# Patient Record
Sex: Female | Born: 1955
Health system: Southern US, Community
[De-identification: ages and names within clinical notes are randomized; demographics above are authoritative.]

## PROBLEM LIST (undated history)

## (undated) DIAGNOSIS — B1920 Unspecified viral hepatitis C without hepatic coma: Secondary | ICD-10-CM

## (undated) DIAGNOSIS — R51 Headache: Secondary | ICD-10-CM

## (undated) DIAGNOSIS — I639 Cerebral infarction, unspecified: Secondary | ICD-10-CM

## (undated) DIAGNOSIS — J449 Chronic obstructive pulmonary disease, unspecified: Secondary | ICD-10-CM

## (undated) DIAGNOSIS — M5430 Sciatica, unspecified side: Secondary | ICD-10-CM

## (undated) DIAGNOSIS — R413 Other amnesia: Secondary | ICD-10-CM

## (undated) DIAGNOSIS — G8929 Other chronic pain: Secondary | ICD-10-CM

## (undated) DIAGNOSIS — F4310 Post-traumatic stress disorder, unspecified: Secondary | ICD-10-CM

## (undated) DIAGNOSIS — R569 Unspecified convulsions: Secondary | ICD-10-CM

## (undated) DIAGNOSIS — H409 Unspecified glaucoma: Secondary | ICD-10-CM

## (undated) DIAGNOSIS — Z8489 Family history of other specified conditions: Secondary | ICD-10-CM

## (undated) DIAGNOSIS — M1990 Unspecified osteoarthritis, unspecified site: Secondary | ICD-10-CM

## (undated) DIAGNOSIS — C50919 Malignant neoplasm of unspecified site of unspecified female breast: Secondary | ICD-10-CM

## (undated) DIAGNOSIS — R531 Weakness: Secondary | ICD-10-CM

## (undated) DIAGNOSIS — J189 Pneumonia, unspecified organism: Secondary | ICD-10-CM

## (undated) DIAGNOSIS — B1710 Acute hepatitis C without hepatic coma: Secondary | ICD-10-CM

## (undated) DIAGNOSIS — E785 Hyperlipidemia, unspecified: Secondary | ICD-10-CM

## (undated) DIAGNOSIS — H579 Unspecified disorder of eye and adnexa: Secondary | ICD-10-CM

## (undated) DIAGNOSIS — I6789 Other cerebrovascular disease: Secondary | ICD-10-CM

## (undated) DIAGNOSIS — F1420 Cocaine dependence, uncomplicated: Secondary | ICD-10-CM

## (undated) DIAGNOSIS — F4321 Adjustment disorder with depressed mood: Secondary | ICD-10-CM

## (undated) DIAGNOSIS — I219 Acute myocardial infarction, unspecified: Secondary | ICD-10-CM

## (undated) DIAGNOSIS — F17200 Nicotine dependence, unspecified, uncomplicated: Secondary | ICD-10-CM

## (undated) DIAGNOSIS — I10 Essential (primary) hypertension: Secondary | ICD-10-CM

## (undated) DIAGNOSIS — I2510 Atherosclerotic heart disease of native coronary artery without angina pectoris: Secondary | ICD-10-CM

## (undated) DIAGNOSIS — M549 Dorsalgia, unspecified: Secondary | ICD-10-CM

## (undated) HISTORY — DX: Acute hepatitis C without hepatic coma: B17.10

## (undated) HISTORY — PX: COLONOSCOPY W/ POLYPECTOMY: SHX1380

## (undated) HISTORY — DX: Other cerebrovascular disease: I67.89

## (undated) HISTORY — DX: Adjustment disorder with depressed mood: F43.21

## (undated) HISTORY — DX: Atherosclerotic heart disease of native coronary artery without angina pectoris: I25.10

## (undated) HISTORY — DX: Cerebral infarction, unspecified: I63.9

## (undated) HISTORY — DX: Cocaine dependence, uncomplicated: F14.20

## (undated) HISTORY — DX: Unspecified viral hepatitis C without hepatic coma: B19.20

## (undated) HISTORY — PX: TONSILLECTOMY: SUR1361

## (undated) HISTORY — DX: Unspecified convulsions: R56.9

## (undated) HISTORY — DX: Acute myocardial infarction, unspecified: I21.9

## (undated) HISTORY — DX: Chronic obstructive pulmonary disease, unspecified: J44.9

## (undated) HISTORY — DX: Essential (primary) hypertension: I10

## (undated) HISTORY — DX: Nicotine dependence, unspecified, uncomplicated: F17.200

## (undated) HISTORY — PX: EYE SURGERY: SHX253

---

## 1982-11-17 DIAGNOSIS — Z8489 Family history of other specified conditions: Secondary | ICD-10-CM

## 1982-11-17 HISTORY — DX: Family history of other specified conditions: Z84.89

## 1998-05-30 ENCOUNTER — Emergency Department (HOSPITAL_COMMUNITY): Admission: EM | Admit: 1998-05-30 | Discharge: 1998-05-30 | Payer: Self-pay | Admitting: Emergency Medicine

## 1998-06-06 ENCOUNTER — Inpatient Hospital Stay (HOSPITAL_COMMUNITY): Admission: AD | Admit: 1998-06-06 | Discharge: 1998-06-08 | Payer: Self-pay | Admitting: Internal Medicine

## 1999-07-29 ENCOUNTER — Emergency Department (HOSPITAL_COMMUNITY): Admission: EM | Admit: 1999-07-29 | Discharge: 1999-07-29 | Payer: Self-pay | Admitting: Emergency Medicine

## 1999-07-29 ENCOUNTER — Encounter: Payer: Self-pay | Admitting: Emergency Medicine

## 2000-04-17 ENCOUNTER — Encounter: Payer: Self-pay | Admitting: Emergency Medicine

## 2000-04-17 ENCOUNTER — Emergency Department (HOSPITAL_COMMUNITY): Admission: EM | Admit: 2000-04-17 | Discharge: 2000-04-17 | Payer: Self-pay | Admitting: Emergency Medicine

## 2002-10-03 ENCOUNTER — Inpatient Hospital Stay (HOSPITAL_COMMUNITY): Admission: EM | Admit: 2002-10-03 | Discharge: 2002-10-14 | Payer: Self-pay | Admitting: Emergency Medicine

## 2002-10-03 ENCOUNTER — Encounter: Payer: Self-pay | Admitting: Emergency Medicine

## 2002-10-06 ENCOUNTER — Encounter: Payer: Self-pay | Admitting: Internal Medicine

## 2002-10-11 ENCOUNTER — Encounter: Payer: Self-pay | Admitting: Internal Medicine

## 2002-10-14 ENCOUNTER — Inpatient Hospital Stay (HOSPITAL_COMMUNITY)
Admission: RE | Admit: 2002-10-14 | Discharge: 2002-10-28 | Payer: Self-pay | Admitting: Physical Medicine & Rehabilitation

## 2003-03-29 ENCOUNTER — Encounter: Payer: Self-pay | Admitting: Internal Medicine

## 2003-03-29 ENCOUNTER — Emergency Department (HOSPITAL_COMMUNITY): Admission: EM | Admit: 2003-03-29 | Discharge: 2003-03-29 | Payer: Self-pay | Admitting: Internal Medicine

## 2003-06-23 ENCOUNTER — Emergency Department (HOSPITAL_COMMUNITY): Admission: EM | Admit: 2003-06-23 | Discharge: 2003-06-23 | Payer: Self-pay | Admitting: Emergency Medicine

## 2003-06-23 ENCOUNTER — Encounter: Payer: Self-pay | Admitting: Emergency Medicine

## 2004-05-04 ENCOUNTER — Emergency Department (HOSPITAL_COMMUNITY): Admission: EM | Admit: 2004-05-04 | Discharge: 2004-05-04 | Payer: Self-pay | Admitting: Emergency Medicine

## 2004-05-22 ENCOUNTER — Encounter: Admission: RE | Admit: 2004-05-22 | Discharge: 2004-05-22 | Payer: Self-pay | Admitting: Sports Medicine

## 2004-05-29 ENCOUNTER — Encounter: Admission: RE | Admit: 2004-05-29 | Discharge: 2004-05-29 | Payer: Self-pay | Admitting: Family Medicine

## 2004-07-14 ENCOUNTER — Emergency Department (HOSPITAL_COMMUNITY): Admission: EM | Admit: 2004-07-14 | Discharge: 2004-07-14 | Payer: Self-pay | Admitting: Emergency Medicine

## 2004-09-05 ENCOUNTER — Ambulatory Visit: Payer: Self-pay | Admitting: Family Medicine

## 2004-09-12 ENCOUNTER — Ambulatory Visit: Payer: Self-pay | Admitting: Family Medicine

## 2004-11-15 ENCOUNTER — Emergency Department (HOSPITAL_COMMUNITY): Admission: EM | Admit: 2004-11-15 | Discharge: 2004-11-15 | Payer: Self-pay | Admitting: Emergency Medicine

## 2005-01-09 ENCOUNTER — Ambulatory Visit: Payer: Self-pay | Admitting: Family Medicine

## 2005-02-24 ENCOUNTER — Ambulatory Visit: Payer: Self-pay | Admitting: Family Medicine

## 2005-03-22 ENCOUNTER — Encounter (INDEPENDENT_AMBULATORY_CARE_PROVIDER_SITE_OTHER): Payer: Self-pay | Admitting: *Deleted

## 2005-03-22 LAB — CONVERTED CEMR LAB

## 2005-03-27 ENCOUNTER — Ambulatory Visit: Payer: Self-pay | Admitting: Family Medicine

## 2005-03-27 ENCOUNTER — Other Ambulatory Visit: Admission: RE | Admit: 2005-03-27 | Discharge: 2005-03-27 | Payer: Self-pay | Admitting: Family Medicine

## 2005-06-10 ENCOUNTER — Ambulatory Visit: Payer: Self-pay | Admitting: Sports Medicine

## 2005-07-03 ENCOUNTER — Ambulatory Visit: Payer: Self-pay | Admitting: Family Medicine

## 2005-08-18 ENCOUNTER — Ambulatory Visit: Payer: Self-pay | Admitting: Family Medicine

## 2006-01-10 ENCOUNTER — Inpatient Hospital Stay (HOSPITAL_COMMUNITY): Admission: EM | Admit: 2006-01-10 | Discharge: 2006-01-12 | Payer: Self-pay | Admitting: Emergency Medicine

## 2006-04-07 ENCOUNTER — Ambulatory Visit: Payer: Self-pay | Admitting: Sports Medicine

## 2006-07-09 ENCOUNTER — Ambulatory Visit: Payer: Self-pay | Admitting: Family Medicine

## 2007-01-14 DIAGNOSIS — I6789 Other cerebrovascular disease: Secondary | ICD-10-CM | POA: Insufficient documentation

## 2007-01-14 DIAGNOSIS — F1420 Cocaine dependence, uncomplicated: Secondary | ICD-10-CM

## 2007-01-14 DIAGNOSIS — I10 Essential (primary) hypertension: Secondary | ICD-10-CM | POA: Insufficient documentation

## 2007-01-14 DIAGNOSIS — F17200 Nicotine dependence, unspecified, uncomplicated: Secondary | ICD-10-CM | POA: Insufficient documentation

## 2007-01-14 DIAGNOSIS — R569 Unspecified convulsions: Secondary | ICD-10-CM

## 2007-01-14 HISTORY — DX: Essential (primary) hypertension: I10

## 2007-01-14 HISTORY — DX: Unspecified convulsions: R56.9

## 2007-01-14 HISTORY — DX: Cocaine dependence, uncomplicated: F14.20

## 2007-01-14 HISTORY — DX: Other cerebrovascular disease: I67.89

## 2007-01-14 HISTORY — DX: Nicotine dependence, unspecified, uncomplicated: F17.200

## 2007-01-15 ENCOUNTER — Encounter (INDEPENDENT_AMBULATORY_CARE_PROVIDER_SITE_OTHER): Payer: Self-pay | Admitting: *Deleted

## 2007-02-24 ENCOUNTER — Ambulatory Visit: Payer: Self-pay | Admitting: Family Medicine

## 2007-04-18 ENCOUNTER — Inpatient Hospital Stay (HOSPITAL_COMMUNITY): Admission: EM | Admit: 2007-04-18 | Discharge: 2007-04-21 | Payer: Self-pay | Admitting: Emergency Medicine

## 2007-04-19 ENCOUNTER — Encounter (INDEPENDENT_AMBULATORY_CARE_PROVIDER_SITE_OTHER): Payer: Self-pay | Admitting: Neurology

## 2007-04-20 ENCOUNTER — Ambulatory Visit: Payer: Self-pay | Admitting: Physical Medicine & Rehabilitation

## 2007-05-17 ENCOUNTER — Encounter (HOSPITAL_COMMUNITY): Admission: RE | Admit: 2007-05-17 | Discharge: 2007-05-17 | Payer: Self-pay | Admitting: Cardiology

## 2007-06-06 ENCOUNTER — Emergency Department (HOSPITAL_COMMUNITY): Admission: EM | Admit: 2007-06-06 | Discharge: 2007-06-07 | Payer: Self-pay | Admitting: Emergency Medicine

## 2007-11-18 HISTORY — PX: CORONARY ANGIOPLASTY WITH STENT PLACEMENT: SHX49

## 2007-12-19 ENCOUNTER — Encounter: Payer: Self-pay | Admitting: Emergency Medicine

## 2007-12-19 ENCOUNTER — Inpatient Hospital Stay (HOSPITAL_COMMUNITY): Admission: RE | Admit: 2007-12-19 | Discharge: 2007-12-22 | Payer: Self-pay | Admitting: Cardiology

## 2007-12-20 DIAGNOSIS — I219 Acute myocardial infarction, unspecified: Secondary | ICD-10-CM

## 2007-12-20 HISTORY — DX: Acute myocardial infarction, unspecified: I21.9

## 2008-02-03 ENCOUNTER — Encounter (HOSPITAL_COMMUNITY): Admission: RE | Admit: 2008-02-03 | Discharge: 2008-03-04 | Payer: Self-pay | Admitting: Cardiology

## 2008-03-06 ENCOUNTER — Encounter (HOSPITAL_COMMUNITY): Admission: RE | Admit: 2008-03-06 | Discharge: 2008-04-05 | Payer: Self-pay | Admitting: Cardiology

## 2008-03-22 ENCOUNTER — Inpatient Hospital Stay (HOSPITAL_COMMUNITY): Admission: AC | Admit: 2008-03-22 | Discharge: 2008-03-28 | Payer: Self-pay

## 2008-06-04 ENCOUNTER — Emergency Department (HOSPITAL_COMMUNITY): Admission: EM | Admit: 2008-06-04 | Discharge: 2008-06-04 | Payer: Self-pay | Admitting: Emergency Medicine

## 2008-07-10 ENCOUNTER — Ambulatory Visit: Payer: Self-pay | Admitting: Sports Medicine

## 2008-07-10 ENCOUNTER — Telehealth: Payer: Self-pay | Admitting: *Deleted

## 2008-07-10 DIAGNOSIS — I2510 Atherosclerotic heart disease of native coronary artery without angina pectoris: Secondary | ICD-10-CM | POA: Insufficient documentation

## 2008-07-10 DIAGNOSIS — G3184 Mild cognitive impairment, so stated: Secondary | ICD-10-CM | POA: Insufficient documentation

## 2008-07-10 DIAGNOSIS — Z9189 Other specified personal risk factors, not elsewhere classified: Secondary | ICD-10-CM | POA: Insufficient documentation

## 2008-07-10 HISTORY — DX: Atherosclerotic heart disease of native coronary artery without angina pectoris: I25.10

## 2008-10-06 ENCOUNTER — Ambulatory Visit (HOSPITAL_COMMUNITY): Admission: RE | Admit: 2008-10-06 | Discharge: 2008-10-06 | Payer: Self-pay | Admitting: Cardiology

## 2008-10-16 ENCOUNTER — Emergency Department (HOSPITAL_COMMUNITY): Admission: EM | Admit: 2008-10-16 | Discharge: 2008-10-17 | Payer: Self-pay | Admitting: Emergency Medicine

## 2009-01-11 ENCOUNTER — Encounter: Payer: Self-pay | Admitting: Family Medicine

## 2009-01-11 ENCOUNTER — Ambulatory Visit: Payer: Self-pay | Admitting: Family Medicine

## 2009-01-11 DIAGNOSIS — F4321 Adjustment disorder with depressed mood: Secondary | ICD-10-CM | POA: Insufficient documentation

## 2009-01-11 HISTORY — DX: Adjustment disorder with depressed mood: F43.21

## 2009-01-16 ENCOUNTER — Encounter: Payer: Self-pay | Admitting: Family Medicine

## 2009-01-16 DIAGNOSIS — R7402 Elevation of levels of lactic acid dehydrogenase (LDH): Secondary | ICD-10-CM | POA: Insufficient documentation

## 2009-01-16 DIAGNOSIS — R7401 Elevation of levels of liver transaminase levels: Secondary | ICD-10-CM | POA: Insufficient documentation

## 2009-01-16 LAB — CONVERTED CEMR LAB
ALT: 51 units/L — ABNORMAL HIGH (ref 0–35)
AST: 86 units/L — ABNORMAL HIGH (ref 0–37)
Albumin: 4.2 g/dL (ref 3.5–5.2)
Calcium: 9 mg/dL (ref 8.4–10.5)
Chloride: 104 meq/L (ref 96–112)
Hemoglobin: 12.6 g/dL (ref 12.0–15.0)
Platelets: 163 10*3/uL (ref 150–400)
Potassium: 3.8 meq/L (ref 3.5–5.3)
RDW: 13.4 % (ref 11.5–15.5)
Total Protein: 7.6 g/dL (ref 6.0–8.3)

## 2009-02-16 ENCOUNTER — Encounter: Payer: Self-pay | Admitting: Family Medicine

## 2009-02-16 ENCOUNTER — Ambulatory Visit: Payer: Self-pay | Admitting: Family Medicine

## 2009-02-16 ENCOUNTER — Telehealth: Payer: Self-pay | Admitting: Psychology

## 2009-02-16 LAB — CONVERTED CEMR LAB
HCV Ab: REACTIVE — AB
Hep B Core Total Ab: POSITIVE — AB
Hepatitis B Surface Ag: NEGATIVE

## 2009-02-19 ENCOUNTER — Encounter (INDEPENDENT_AMBULATORY_CARE_PROVIDER_SITE_OTHER): Payer: Self-pay | Admitting: *Deleted

## 2009-02-26 ENCOUNTER — Telehealth: Payer: Self-pay | Admitting: Family Medicine

## 2009-02-26 DIAGNOSIS — B1710 Acute hepatitis C without hepatic coma: Secondary | ICD-10-CM | POA: Insufficient documentation

## 2009-02-26 HISTORY — DX: Acute hepatitis C without hepatic coma: B17.10

## 2009-02-28 ENCOUNTER — Encounter: Payer: Self-pay | Admitting: Family Medicine

## 2009-03-05 ENCOUNTER — Ambulatory Visit: Payer: Self-pay | Admitting: Family Medicine

## 2009-03-05 ENCOUNTER — Encounter (INDEPENDENT_AMBULATORY_CARE_PROVIDER_SITE_OTHER): Payer: Self-pay | Admitting: Family Medicine

## 2009-03-07 LAB — CONVERTED CEMR LAB

## 2009-03-17 ENCOUNTER — Emergency Department (HOSPITAL_COMMUNITY): Admission: EM | Admit: 2009-03-17 | Discharge: 2009-03-17 | Payer: Self-pay | Admitting: Emergency Medicine

## 2009-04-19 ENCOUNTER — Encounter: Payer: Self-pay | Admitting: Family Medicine

## 2009-04-19 ENCOUNTER — Ambulatory Visit: Payer: Self-pay | Admitting: Gastroenterology

## 2009-05-01 ENCOUNTER — Encounter (INDEPENDENT_AMBULATORY_CARE_PROVIDER_SITE_OTHER): Payer: Self-pay | Admitting: *Deleted

## 2009-05-16 ENCOUNTER — Ambulatory Visit (HOSPITAL_COMMUNITY): Admission: RE | Admit: 2009-05-16 | Discharge: 2009-05-16 | Payer: Self-pay | Admitting: Gastroenterology

## 2009-05-16 ENCOUNTER — Encounter (INDEPENDENT_AMBULATORY_CARE_PROVIDER_SITE_OTHER): Payer: Self-pay | Admitting: Interventional Radiology

## 2009-05-18 ENCOUNTER — Encounter: Payer: Self-pay | Admitting: Family Medicine

## 2009-06-05 ENCOUNTER — Encounter: Payer: Self-pay | Admitting: Family Medicine

## 2009-06-05 ENCOUNTER — Ambulatory Visit: Payer: Self-pay | Admitting: Gastroenterology

## 2009-07-09 ENCOUNTER — Encounter: Payer: Self-pay | Admitting: Family Medicine

## 2010-09-01 ENCOUNTER — Encounter: Payer: Self-pay | Admitting: Family Medicine

## 2010-10-29 ENCOUNTER — Encounter: Payer: Self-pay | Admitting: Family Medicine

## 2010-12-08 ENCOUNTER — Encounter: Payer: Self-pay | Admitting: Cardiology

## 2010-12-17 NOTE — Miscellaneous (Signed)
Summary: Asthma  Clinical Lists Changes  Problems: Removed problem of ASTHMA, UNSPECIFIED (ICD-493.90) Removed problem of MUSCLE SPASM, BACK (ICD-724.8) Removed problem of BLOOD IN STOOL (ICD-578.1) Removed problem of WEAKNESS (ICD-780.79) Removed problem of BACK PAIN W/RADIATION, UNSPECIFIED (ICD-724.4)
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
## 2011-02-24 LAB — CBC
MCHC: 34.4 g/dL (ref 30.0–36.0)
MCV: 94.9 fL (ref 78.0–100.0)
Platelets: 180 10*3/uL (ref 150–400)

## 2011-02-24 LAB — PROTIME-INR: Prothrombin Time: 14.9 seconds (ref 11.6–15.2)

## 2011-02-25 LAB — RAPID URINE DRUG SCREEN, HOSP PERFORMED
Barbiturates: NOT DETECTED
Cocaine: POSITIVE — AB
Opiates: NOT DETECTED
Tetrahydrocannabinol: POSITIVE — AB

## 2011-02-25 LAB — URINALYSIS, ROUTINE W REFLEX MICROSCOPIC
Glucose, UA: NEGATIVE mg/dL
Hgb urine dipstick: NEGATIVE
Protein, ur: NEGATIVE mg/dL
pH: 6 (ref 5.0–8.0)

## 2011-02-25 LAB — POCT I-STAT, CHEM 8
BUN: 12 mg/dL (ref 6–23)
Calcium, Ion: 1.11 mmol/L — ABNORMAL LOW (ref 1.12–1.32)
Creatinine, Ser: 1.2 mg/dL (ref 0.4–1.2)
Glucose, Bld: 92 mg/dL (ref 70–99)
TCO2: 22 mmol/L (ref 0–100)

## 2011-02-25 LAB — CARBAMAZEPINE LEVEL, TOTAL: Carbamazepine Lvl: <10 ug/mL (ref 4.0–12.0)
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
## 2011-04-01 NOTE — Cardiovascular Report (Signed)
[name], [name]                  ACCOUNT NO.:  [id number]   MEDICAL RECORD NO.:  [id number]          PATIENT TYPE:  INP   LOCATION:  2901                         FACILITY:  [facility]   PHYSICIAN:  Eduardo Osier. Sharyn Lull, M.D. DATE OF BIRTH:  [birth year]   DATE OF PROCEDURE:  12/19/2007  DATE OF DISCHARGE:                            CARDIAC CATHETERIZATION   PROCEDURE:  1. Left cardiac cath with selective left and right coronary      angiography, LV graft via the right groin using Judkins technique.  2. Successful PTCA to proximal and mid junction of RCA using 3.0 x 12      mm long Voyager balloon.  3. Successful deployment of 3.5 x 23 mm long Promus drug eluting stent      in proximal and mid junction of RCA.  4. Successful post dilatation of Promus stent using 4.0 x 15 mm long      PowerSail balloon.   INDICATIONS FOR PROCEDURE:  [name] is a 55 year old black female with  past medical history significant for hypertension, history of CVA, post  traumatic stress syndrome, tobacco abuse, marijuana abuse, history of  cocaine abuse, history of positive family history of coronary artery  disease who was transferred from [facility].  The patient  complained of retrosternal chest pain associated with diaphoresis  radiating to both arms lasting for more than 2 hours.  EKG done at [facility] showed minimal ST elevation in inferolateral leads with no  significant  focal ST depression and nonspecific T-wave changes and was  noted to have elevated troponin I of 0.89 and CPK-MB of 14.1.  The  patient was transferred here for emergency PCI.  The patient states she  has been having this chest pain off and on but as today's chest pain was  severe and prolonged she decided to go to the ER.  Denies any recent  cocaine abuse.  States smoked cocaine approximately 1 month ago.  States  chest pain started while she was watching TV.   PAST MEDICAL HISTORY:  As above.   PAST SURGICAL  HISTORY:  None.   ALLERGIES:  None.   MEDICATIONS:  At home she is on Norvasc, aspirin, hydrochlorothiazide,  Imdur, Tegretol.   SOCIAL HISTORY:  She is single, has one son on disability.  Smokes two  to three cigarettes per day.  Drinks and smokes occasionally, also  smokes marijuana and cocaine occasionally.   FAMILY HISTORY:  Is positive for coronary artery disease.   PHYSICAL EXAMINATION:  GENERAL:  She is alert and oriented x3.  VITAL SIGNS:  Blood pressure was 102/60, pulse was 58 regular.  HEENT:  Conjunctivae was pink.  NECK:  Neck supple.  No JVD, no bruit.  LUNGS:  Lungs were clear to auscultation without rhonchi or rales.  CARDIOVASCULAR:  S1, S2 normal.  There was soft S4 gallop.  ABDOMEN:  Abdomen was soft.  Bowel sounds present, nontender.  EXTREMITIES:  There is no clubbing, cyanosis or edema.   Discussed with the patient at length by Dr. Margretta Ditty, ER physician at  [facility], regarding emergency left cath, possible PTCA with  stenting, its risks and benefits, i.e. death, MI, stroke, need for  emergency CABG, risk of restenosis, local vascular complications.  She  accepted and consented for the procedure.   PROCEDURE IN DETAIL:  After obtaining the informed consent the patient  was brought to the cath lab and was placed on fluoroscopy table.  Right  groin was prepped and draped in usual fashion.  Xylocaine 2% was used  for local anesthesia in the right groin.  With the help of thin-walled  needle a 6 French arterial sheath was placed.  The sheath was aspirated  and flushed.  Next, 6 French left Judkins catheter was advanced over the  wire under fluoroscopic guidance to the ascending aorta.  Wire was  pulled out, the catheter was aspirated and connected to the manifold.  Catheter was further advanced and engaged into left coronary ostium.  Multiple views of the left system were taken.  Next, the catheter was  disengaged and was pulled out over the wire and  was replaced with 6  French right Judkins catheter which was advanced over the wire under  fluoroscopic guidance up to the ascending aorta.  Wire was pulled out,  the catheter was aspirated and connected to the manifold.  Catheter was  further advanced and then engaged into right coronary ostium.  Multiple  views of the right system were taken.  Next, the catheter was disengaged  and was pulled out over the wire and was replaced with 6 French pigtail  catheter which was advanced over the wire under fluoroscopic guidance up  to the ascending aorta.  Wire was pulled out, the catheter was aspirated  and connected to the manifold.  The catheter was further advanced across  aortic valve into the LV.  LV pressures were recorded.  Next, LV graft  was done in 30 degree RAO position.  Post angiographic pressures were  recorded from LV and then pullback pressures were recorded from the  aorta.  There was no gradient across the aortic valve.  Next, a pigtail  catheter was pulled out over the wire.  Sheaths were aspirated and  flushed.   FINDINGS:  LV showed good LV systolic function.  There was mild inferior  wall hypokinesia, EF of 50-55% left main was patent.  LAD has 10-15%  proximal and mid stenosis.  Diagonal one was small, diagonal two has 30-  40% proximal stenosis, this vessel was also small.  Left circumflex has  15-20% mid stenosis.  OM1 to OM3 were very, very small.  OM4 has 30-40%  stenosis.  RCA has proximal and mid junction 99% stenosis and 30-40% mid  stenosis.  PDA is very small.  PLV branches are patent.   INTERVENTIONAL PROCEDURE:  Successful PTCA to proximal and mid junction  of RCA was done using 3.0 x 12 mm long Voyager balloon for predilatation  and then 3.5 x 23 mm long Promus drug eluting stent was deployed in mid  and proximal and mid junction of RCA at 15 atmospheres pressure.  Stent  was postdilated using 4.0 x 15 mm long PowerSail balloon.  Two  inflations were done  going up to 20 atmospheric pressure.  Lesion was  dilated from 99% to 0% residual with excellent TIMI grade III distal  flow without evidence of dissection or distal embolization.  The patient  received weight based heparin, Integrilin and 600 mg of Plavix during  the procedure.  The  patient tolerated the procedure well with no  complications.  The patient was transferred to CCU in stable condition.      Eduardo Osier. Sharyn Lull, M.D.  Electronically Signed     MNH/MEDQ  D:  12/20/2007  T:  12/20/2007  Job:  [id number]   cc:   Lab Cath

## 2011-04-01 NOTE — Discharge Summary (Signed)
NAMEALLAINA, [name]                  ACCOUNT NO.:  [id number]   MEDICAL RECORD NO.:  [id number]          PATIENT TYPE:  INP   LOCATION:  3003                         FACILITY:  [facility]   PHYSICIAN:  Pramod P. Pearlean Brownie, MD    DATE OF BIRTH:  08/06/1956   DATE OF ADMISSION:  04/18/2007  DATE OF DISCHARGE:  04/21/2007                               DISCHARGE SUMMARY   DISCHARGE DIAGNOSES:  1. 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  2. Dyslipidemia.  3. Old left thalamic stroke.  4. Hypertension.  5. History of cocaine abuse.  6. Questionable history of seizure.  7. History of posttraumatic stress disorder.  8. History of cesarean section.   DISCHARGE MEDICATIONS:  1. Aspirin 325 mg a day.  2. Norvasc 10 mg a day.  3. Hydrochlorothiazide 25 mg a day.  4. Carbamazepine 20 b.i.d.   STUDIES PERFORMED:  1. CT of the brain on admission showed no acute abnormality, extensive      chronic microvascular ischemic changes.  2. MRI of the brain shows no acute abnormalities, no significant      change in moderate periventricular and subcortical white matter      disease bilaterally.  Stable remote bilateral lacunar infarct.      There is evidence of remote hemorrhage associated with left      thalamic infarct.  3. MRA of the brain shows normal variant, MRA circle of Willis.  4. MRA of the neck shows mild narrowing in proximal right vertebral      artery; otherwise negative MRA.  5. EKG shows normal sinus rhythm with frequent premature ectopic      complexes, ST elevation, consider inferior infarct or acute      infarct.  6. Transcribed Doppler completed results pending.  7. Carotid Doppler:  No ICA stenosis, vertebral flow antegrade      bilaterally.  8. A 2-D echocardiogram shows EF of 60% with no left ventricular      regional wall motion abnormalities.   LABORATORY STUDIES:  CBC normal.  Differential normal.  __________  normal.  Chemistry normal.  Liver function test  normal.  Hemoglobin A1c  6.1, homocysteine 8.3.  Cardiac enzymes negative.  Cholesterol 216,  triglyceride 472, HDL 42, and LDL unable to calculate.  Carbamazepine  level less than 2.0.  Urine drug screen positive for cocaine, positive  for cannaboids; otherwise normal.  Urinalysis negative.  Alcohol less  than 5.   HISTORY OF PRESENT ILLNESS:  [name] is a 55 year old, right-  handed, African American female with a history of cocaine abuse and  prior left thalamic stroke.  The patient has previously been admitted  for episodes of confusional spells, felt to be seizures.  She is on  Tegretol for this.  The patient has been on aspirin and comes in for  evaluation.  Her history is somewhat confusing.  The patient claims that  she had episodes over the past week with a burning sensation in the left  chest, burning in the feet, feeling weak all over.  These episodes  lasted about  30 minutes and then resolved.  The patient had a  particularly severe episode of the same thing today.  Upon verbal  questioning, the patient denies any focal numbness or weakness, claims  she was weak in a generalized fashion rather than on one side or the  other.  She denies headache or visual field disturbances, speech  disturbance, numbness with the above events with the exception of some  tingling sensation of the left forehead.  The patient was brought in by  EMS as a code stroke with duration of symptoms of 2-3 hours.  Although  the patient claims to have no focal deficits, the patient gives a right  __________ deficit and right-sided weakness by exam and an NIH stroke  scale of 10.  The patient was not a CPA candidate secondary to lack of  definitive diagnoses and functional exam.  She is brought in for further  evaluation.   HOSPITAL COURSE:  MRI was negative for acute infarct.  Urine drug screen  was positive for cocaine.  She was evaluated by PT, OT, as well as by a  physician and felt to have an  inconsistent exam.  PT and OT did  recommend inpatient rehab, and inpatient rehab was asked to see her.  They felt she was back to her baseline, and son agreed, who was at  bedside.  The patient is stable for discharge home.  The patient does  have some vascular risk factors for stroke, but felt this was a  conversion reaction.  We will discharge home and ask her to follow up  with her regular medical physician and psychiatrist.   CONDITION ON DISCHARGE:  Patient alert and oriented x3.  Face symmetric.  Heart rate regular.  Breath sounds clear.  Good strength in all  extremities, though she has give away weakness on the right arm and leg.  Her sensation is intact to light touch.   DISCHARGE PLAN:  1. Discharge home with family.  2. Aspirin 325 mg for stroke prevention.  3. Statin for dyslipidemia.  4. Follow up with primary care physician in 1 month.  5. Follow up with neurologist as needed; no required appointment.      [name], N.P.    ______________________________  Sunny Schlein. Pearlean Brownie, MD    SB/MEDQ  D:  04/21/2007  T:  04/21/2007  Job:  [id number]   cc:   Pramod P. Pearlean Brownie, MD  Dwana Curd. Para March, M.D.

## 2011-04-01 NOTE — H&P (Signed)
NAMEIVYANA, LOCEY                  ACCOUNT NO.:  [id number]   MEDICAL RECORD NO.:  [id number]          PATIENT TYPE:  INP   LOCATION:  1823                         FACILITY:  [facility]   PHYSICIAN:  Marlan Palau, M.D.  DATE OF BIRTH:  08/14/1956   DATE OF ADMISSION:  04/18/2007  DATE OF DISCHARGE:                              HISTORY & PHYSICAL   HISTORY:  This is a 55 year old right-handed black female born 1956-03-20, with a history of cocaine abuse and prior left thalamic stroke.  This patient has previously been admitted for episodes of confusional  spells, felt possibly to also have seizures.  The patient is on Tegretol  for this.  The patient has been on aspirin and comes in today for  evaluation.  History is somewhat confusing.  This patient claims that  she has had episodes over the last week with a burning sensation in the  left chest, burning in the feet, feeling weak all over.  These episodes  last about 30 minutes and resolve.  The patient has had a particularly  severe episode of the same thing today.  Upon verbal questioning, the  patient denies any focal numbness or weakness, claims that she was weak  in a generalized fashion rather than on one side or the other.  The  patient denies headache or visual field disturbance, speech disturbance,  numbness with the above events with the exception of some tingling  sensations on the left forehead.  This patient was brought in by EMS as  a Code Stroke with duration of symptoms of either 2 or 3 hours.  Although the patient claims to have no focal deficits, the patient gives  a right hemisensory deficit and right-sided weakness by examination with  an NIH stroke scale of 10.  The patient is being brought in for further  evaluation of the above.   PAST MEDICAL HISTORY:  Notable for:  1. New onset of right hemiparesis, hemisensory deficit.  2. Old left thalamic stroke event.  3. Functional features on clinical examination  today.  4. Hypertension.  5. Cocaine abuse in the past.  6. Questionable history of seizure.  7. History of post-traumatic stress disorder.  8. History of C-section in the past.   MEDICATIONS:  1. Tegretol 200 mg b.i.d.  2. Hydrochlorothiazide 25 mg daily.  3. Norvasc 10 mg a day.  4. Aspirin.   The patient again states no known allergies.   Smokes four to five cigarettes daily.  Drinks about a six-pack a day.  Claims she is still doing marijuana, denies cocaine.   REVIEW OF SYSTEMS:  Notable that this patient is not married, lives in  the [place] area, does not work, is on disability because of her  previous stroke.  The patient has two children who are alive and well.   FAMILY MEDICAL HISTORY:  Mother died with a stroke.  Father died with  cancer.  Patient has five brothers, four sisters.  One sister has heart  disease.  One sister has hypertension.  One brother has had a stroke.  REVIEW OF SYSTEMS:  Notable for no recent fevers, chills.  The patient  denies any headache, neck pain, shortness of breath, chest pains.  Denies any abdominal pain.  Has no troubles with appetite or swallowing.  No abdominal pain, nausea, vomiting, or any troubles controlling the  bowels or bladder.  Denies dizziness or blackout episodes.   PHYSICAL EXAMINATION:  VITAL SIGNS:  Blood pressure 117/68, heart rate  is 58, respiratory rate 18, temperature afebrile.  GENERAL:  The patient is a fairly well-developed black female who is  alert and cooperative at the time of examination.  HEENT:  Head is atraumatic.  Eyes:  Pupils equal, round, react to light.  Disks are flat bilaterally.  NECK:  Supple.  No carotid bruits noted.  RESPIRATORY:  Clear.  CARDIOVASCULAR:  Reveals a regular rate and rhythm.  No obvious murmurs  or rubs noted.  EXTREMITIES:  Without significant edema.  ABDOMEN:  Reveals positive bowel sounds.  No organomegaly or tenderness  noted.  NEUROLOGIC:  Cranial nerves as above.   Facial symmetry is present.  The  patient has good sensation of the face to pinprick and soft touch  bilaterally, but upon retesting patient has a difference in vibratory  sensation on the forehead, depressed on the right, splits midline with  vibratory sensation.  The patient has good symmetric facial expression  and grimace; good strength to facial muscles and the muscles to head  turning and shoulder shrug bilaterally.  Speech is well enunciated not  aphasic.  Again, extraocular movements are full, visual fields are full.  Motor testing reveals almost plegic right arm and right leg.  The  patient has variable testing when letting go of the right arm she will  hold it suspended for a brief period time and then let it drop.  The  patient has similar changes with the right leg.  Has good strength in  the left arm and left leg.  Notes almost anesthesia to pinprick,  vibratory sensation on the right leg.  Initially noted normal sensation  to arms on both sides but later on this was decreased and vibration  sensation on the right arm was decreased compared to the left.  The  patient is unable to perform finger-nose-finger or heel-to-shin with her  right side, is able to perform with the left side.  No ataxia was seen.  The patient has good symmetric reflexes throughout, definitely downgoing  toes bilaterally.   NIH stroke scale was 10.   LABORATORY VALUES:  Notable for white count of 7.2, hemoglobin of 12.7,  hematocrit 36.6, MCV of 88.7, platelets of 312.  Troponin I less than  0.05.  CK-MB less than 1.   CT of the head was unremarkable with the exception of the old left  thalamic stroke seen and possibly some evolvement of the left centrum  semiovale, no acute changes noted.   IMPRESSION:  1. New-onset right hemiparesis.  2. Clinical examination with functional features.  3. Hypertension.  4. Questionable seizures in the past.  This patient presents with an unclear clinical  history.  The patient  claims to have had events of left chest burning and feet burning over  the last week.  Denies any focal weakness but then presents with right-  sided weakness on today's examination.  The patient is unable to  indicate the exact time of onset per se.  The patient has undergone a CT  scan of the head that is unremarkable and clinical examination  suggests  functional features.  The patient is felt not to be a tPA candidate  because of lack of adequate history of time of onset and functional  features on examination.  Will admit this patient for further workup.   1. Admission to [facility].  2. MRI of the brain.  3. MR angiogram with internal and external vessels.  4. A 2-D echocardiogram.  5. Physical and occupational therapy.  6. Aspirin therapy.   Will follow the patient's clinical course while in-house.      Marlan Palau, M.D.  Electronically Signed     CKW/MEDQ  D:  04/18/2007  T:  04/18/2007  Job:  [id number]   cc:   [place] area Dr. Para March

## 2011-04-01 NOTE — Discharge Summary (Signed)
NAMEQUIARA, [name]                  ACCOUNT NO.:  [id number]   MEDICAL RECORD NO.:  [id number]          PATIENT TYPE:  INP   LOCATION:  3737                         FACILITY:  [facility]   PHYSICIAN:  Mohan N. Sharyn Lull, M.D. DATE OF BIRTH:  Jan 21, 1956   DATE OF ADMISSION:  12/19/2007  DATE OF DISCHARGE:  12/22/2007                               DISCHARGE SUMMARY   ADMISSION DIAGNOSES:  1. Acute non-Q-wave myocardial infarction.  2. Hypertension.  3. History of cerebrovascular accident.  4. Post-traumatic stress syndrome.  5. Tobacco abuse.  6. Cocaine abuse.  7. Marijuana abuse.  8. History of alcohol abuse.   FINAL DIAGNOSES:  1. Status post non-Q-wave myocardial infarction.  2. Status post percutaneous transluminal coronary angioplasty and      stenting to right coronary artery.  3. Hypertension.  4. Hypercholesteremia.  5. History of cerebrovascular accident.  6. Tobacco abuse.  7. History of cocaine and marijuana abuse in the past.  8. Alcohol abuse.  9. Post-traumatic stress syndrome.  10.Glucose intolerance.   DISCHARGE MEDICATIONS:  1. Enteric-coated aspirin 325 mg 1 tablet daily.  2. Plavix 75 mg 1 tablet daily with food.  3. Coreg 3.125 mg 1 tablet every 12 hours.  4. Altace 2.5 mg 1 daily.  5. Lipitor 40 mg 1 tablet daily.  6. Tegretol 200 mg 1 tablet 3 times per day as before.  7. Nitrostat 0.4 mg sublingual use as directed.   DIET:  Low salt, low cholesterol.  The patient has been advised to avoid  sweets.   ACTIVITY:  Increase activity slowly as tolerated.   DISCHARGE INSTRUCTIONS:  Post PTCA and stent instructions have been  given.  The patient will be scheduled for phase II cardiac rehab as  outpatient.   CONDITION ON DISCHARGE:  Stable.   BRIEF HISTORY/HOSPITAL COURSE:  Ms. Furth is a 55 year old black female  with past medical history significant for hypertension, history of CVA,  post-traumatic stress syndrome, tobacco abuse, marijuana abuse, history  of cocaine abuse, positive family history of coronary artery disease who  was transferred from [facility].  The patient complained of  retrosternal chest pain and associated diaphoresis, radiating to both  arms lasting for more than 2 hours.  EKG done at [facility]  showed minimal ST elevation in inferolateral leads with no significant  reciprocal changes and nonspecific T-wave changes.  The patient was  noted to have elevated troponin-I of 0.89 and CPK-MB of 14.3.  The  patient was transferred here for emergency PCI.  The patient states she  has been having chest pain off and on, but today the chest pain was more  severe and prolonged, so decided to go to the ER.  Denies any recent  cocaine abuse.  States smoked approximately 1 month ago.   PAST MEDICAL HISTORY:  As above.   PAST SURGICAL HISTORY:  None.   ALLERGIES:  NONE.   HOME MEDICATIONS:  1. Norvasc.  2. Aspirin.  3. Hydrochlorothiazide.  4. Tegretol.  5. Imdur.   SOCIAL HISTORY:  She is single, has 1 son  on disability.  Smoked 3  cigarettes per day.  Drinks occasionally. Smokes marijuana and cocaine  occasionally.   FAMILY HISTORY:  Positive for coronary artery disease.   PHYSICAL EXAMINATION:  GENERAL:  She is alert, awake and oriented x3 in  no acute distress.  VITAL SIGNS:  Blood pressure was 102/60, pulse was 58 regular.  HEENT:  Conjunctiva was pink.  NECK:  Supple, no JVD, no bruit.  LUNGS:  Clear to auscultation without rhonchi or rales.  CARDIOVASCULAR:  S1 and S2 normal.  There was a soft systolic murmur and  S4 gallop.  There was no S3 gallop.  ABDOMEN:  Soft.  Bowel sounds were present, nontender.  EXTREMITIES:  There is no clubbing, cyanosis or edema.   LABORATORY DATA:  CK-MB was 8.8, troponin-I 1.42, hemoglobin 12.2,  hematocrit 34.2, white count of 7.0, sodium 141, potassium 3.5, chloride  106, bicarb 29, glucose 115, BUN 8, creatinine 0.97.  Her cardiac  enzymes after PCI:  CPK  230, MB 25, index 10.9.  Troponin-I was , 3.44.  Repeat troponin-I was 2.28, today troponin-I is 1.56.  CPK repeat was  168, MB 14, index 8.7.  Today CK is 102, MB 6.0, potassium is 3.5, H&H  12.8 and 36.2.   BRIEF HOSPITAL COURSE:  The patient was admitted.  The patient was  transferred from [facility] and  directly underwent emergency  angioplasty, left cardiac cath with selective left and right coronary  angiography, PTCA and stenting to RCA as per procedure report.  The  patient did not have any episodes of chest pain during the hospital  stay.  Her groin is stable with no evidence of hematoma or bruit.  Phase  I cardiac rehab was called.  The  patient has been ambulating in hallway without any problems.  Her  cardiac markers have gradually trended down towards baseline.  The  patient has been advised to refrain from smoking or drug abuse to which  the patient agrees.  The patient will be scheduled for phase II cardiac  rehab as outpatient.      Eduardo Osier. Sharyn Lull, M.D.  Electronically Signed     MNH/MEDQ  D:  12/22/2007  T:  12/23/2007  Job:  [id number]   cc:   Eduardo Osier. Sharyn Lull, M.D.
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
## 2011-04-04 NOTE — Procedures (Signed)
EEG NUMBER:  __________   HISTORY:  This is a 55 year old with confusion and shaking episodes.  The  patient is having an EEG done to evaluate for seizure activity.   PROCEDURE:  This is a routine EEG.   TECHNICAL DESCRIPTION:  Throughout this routine EEG, there is a posterior-  dominant rhythm of 9.5-Hz activity at 25-35 microvolts.  The background  activity is fairly symmetric and mostly comprised of alpha-range activity at  15-30 microvolts.  There are a few times during the tracing where background  becomes asymmetric with intermittent left hemispheric slowing with theta-  range activity noticed.  There is also on occasion very rare small sharp  transients noted in the left hemisphere and questioning whether it is  artifactual; however, some seem to be fairly prominent sharp waves with the  field.  With photic stimulation, there is a symmetric photic driving  response.  Hyperventilation does not produce any significant abnormalities.  The patient does become drowsy, however, does not fall asleep.  Throughout  this tracing, there is no electrographic seizure.   IMPRESSION:  This routine EEG is abnormal secondary to intermittent left  hemispheric slowing and small left sharp transients.  The intermittent left  hemispheric slowing is suggestive of an underlying structural lesion.  The  small rare sharp waves are sharp transients and could suggest cortical  irritation and possible seizure focus and we will consider repeating a sleep-  deprived EEG if seizures are of high concern.      Bevelyn Buckles. Nash Shearer, M.D.  Electronically Signed     ZOX:WRUE  D:  01/12/2006 16:06:22  T:  01/13/2006 10:12:31  Job #:  [id number]

## 2011-04-04 NOTE — Discharge Summary (Signed)
NAMERODERICA, CATHELL                  ACCOUNT NO.:  [id number]   MEDICAL RECORD NO.:  [id number]          PATIENT TYPE:  INP   LOCATION:  3039                         FACILITY:  [facility]   PHYSICIAN:  Marlan Palau, M.D.  DATE OF BIRTH:  [birth year]   DATE OF ADMISSION:  01/10/2006  DATE OF DISCHARGE:  01/12/2006                                 DISCHARGE SUMMARY   ADMISSION DIAGNOSES:  1.  Episodes of pain and confusion, etiology unclear.  2.  History of hypertension.   DISCHARGE DIAGNOSES:  1.  Episodes of pain and confusion, probable seizure events.  2.  Hypertension.   PROCEDURES DONE DURING THIS ADMISSION:  1.  MRI of the brain.  2.  MRI angiogram.  3.  EEG study.   COMPLICATIONS OF THE ABOVE PROCEDURES:  None.   HISTORY OF PRESENT ILLNESS:  Shauntel Prest is a 55 year old, right-handed black  female born [birth year] with a history of cerebrovascular disease in the  past with episodes of right-sided weakness.  The patient suffered a stroke  several years ago, apparently in conjunction with cocaine abuse.  This  patient has had intermittent episodes of pain beginning in the neck area,  spreading throughout the body, associated with agitation and confusion.  The  episode usually resolves within two hours, but the patient had a more severe  episode today and has remained somewhat agitated and confused and was sent  to the emergency room for an evaluation.  The patient was fully ambulatory  at the time the EMS arrived, was able to lay down on the stretcher once she  calmed down.  The patient has remained confused in the emergency room and  code stroke was called.  CT of the head was done through the emergency room  and showed some small vessel ischemic changes, predominantly in the right  parietal area.  No acute changes were seen.  Neurology was called for an  evaluation.   PAST MEDICAL HISTORY:  Significant for:  1.  New onset of confusional state, pain syndrome.  2.  Stroke  event affecting the right side of the body in the past.  3.  Hypertension.  4.  Cocaine abuse.  5.  History of seizures in the past.  The patient is currently on no      medications.  6.  History of episodic neck, body pain, confusion in the past.  According      to the son, the patient will have events off and on, may have several a      month.   CURRENT MEDICATIONS:  1.  Hydrochlorothiazide 25 mg a day.  2.  Norvasc 5 mg a day.  3.  Flexeril as needed.   ALLERGIES:  The patient has no known allergies.   The patient does not smoke, drinks three or four beers a day.  It is not  clear if the patient is currently using cocaine.  Please refer to history  and physical dictation summary for social history, family history, review of  systems, physical examination.   LABORATORY VALUES:  Notable for a white count 7.8, hemoglobin 13, hematocrit  37.4, MCV 88.8, platelets  315,000.  INR 1.1.  Sodium 134, potassium 3.3,  chloride 101, CO2 30, glucose 115,  BUN 27, creatinine 1.2, calcium 8.9.  Total protein 7.7, albumin 4.1.  AST 35, ALT 32, ALP 48, total bilirubin  0.6.  Urine drug screen was negative.   EKG reveals normal sinus rhythm, normal EKG, heart rate of 83.   HOSPITAL COURSE:  This patient has done fairly well during the course of the  hospitalization.  The patient initially seemed to be confused.  The patient  was admitted for further evaluation.  MRI scan of the brain was done and  shows an old left thalamic cerebrovascular infarction.  Old lacunar stroke  in the right thalamus is also seen.  Small vessel ischemic changes were seen  in the deep white matter bilaterally.  There was some question of acute  extension of the left thalamic stroke area, but it was felt likely to be due  to a T-2 shine-through phenomenon.  MRI angiogram is unremarkable  intracranially.   The patient has cleared with all of her confusion deficits, now is at her  baseline, alert, cooperative, moves  all four extremities, full visual  fields, normal speech, and ambulatory.  The patient will have an EEG study  on the day of discharge and will be discharged following this study.   DISCHARGE MEDICATIONS:  1.  Hydrochlorothiazide 25 mg 1 a day.  2.  Norvasc 5 mg a day.  3.  Aspirin 325 mg a day.  4.  Tegretol 200 mg 1/2 tablet twice a day for seven days and then go to 1      full tablet twice a day.   The patient will follow up with [facility] within four  weeks following discharge.  The family will contact me if any further  problems arise.      Marlan Palau, M.D.  Electronically Signed     CKW/MEDQ  D:  01/12/2006  T:  01/12/2006  Job:  [id number]   cc:   [facility]  [address] [place],  [address]   Para March, M.D.  Family Practice

## 2011-04-04 NOTE — H&P (Signed)
NAMEERANDY, MCEACHERN                  ACCOUNT NO.:  [id number]   MEDICAL RECORD NO.:  [id number]          PATIENT TYPE:  INP   LOCATION:  3039                         FACILITY:  [facility]   PHYSICIAN:  Marlan Palau, M.D.  DATE OF BIRTH:  04/08/1956   DATE OF ADMISSION:  01/10/2006  DATE OF DISCHARGE:                                HISTORY & PHYSICAL   HISTORY OF PRESENT ILLNESS:  [name] is a 55 year old right-handed black  female born 1956-01-25 with a history in the past of cerebrovascular  disease with right-sided weakness. The patient apparently had a stroke  several years ago in conjunction with cocaine abuse. The patient has had  intermittent episodes described by the son of pain beginning in the neck  area, spreading out throughout the body with some agitation and confused.  The patient usually will lay down for a couple of hours and seems to recover  fairly well. Episodes may not occur for a month and then occur several times  a week and are unpredictable. The patient was last seen normal around 11  a.m. today but by 12 noon the patient was once again complaining of severe  neck pain, pain going down throughout the body into the legs and feet. The  patient claims that she just could not take it, became agitated. EMS was  called. The patient was ambulatory at the time EMS arrived. The patient  finally calmed down, was able to lay down on the stretcher and come to the  emergency room. At this point, the patient denies any pain but is apparently  confused but moves all four extremities. The son claimed that this is a more  severe episode that usual but in general falls in to the general pattern of  her usual events. The patient has had a CT scan of the head which shows some  chronic small vessel changes predominantly in the right parietal area.  Otherwise, no acute changes seen. Neurology was called as a code stroke.   PAST MEDICAL HISTORY:  1.  Significant for new onset of  confusional state, pain syndrome as above.  2.  History of stroke effecting the right side of the body in the past.  3.  History of hypertension.  4.  Cocaine abuse.  5.  History of seizures in the past.  6.  History of episodic neck, body pain, confusion as above.   CURRENT MEDICATIONS:  1.  Hydrochlorothiazide 25 milligrams daily.  2.  Norvasc 5 milligrams daily.  3.  Flexeril p.r.n. neck pain.   ALLERGIES:  The patient states no known allergies.   SOCIAL HISTORY:  Does smoke. Drinks three or four beers a week.   SOCIAL HISTORY:  This patient is single. Lives in the [place] area. Has one daughter and one son. Lives with her son. Is not  employed.   FAMILY HISTORY:  Notable that mother died with renal failure. Father died  with cancer. The patient has nine siblings. One sister died during  childbirth. No history of diabetes in the family.  REVIEW OF SYSTEMS:  Difficult to obtain but the patient reports no headache  or pain at the moment. The patient denies any numbness, feels weak all over,  denies any abdominal pain, chest pain, shortness of breath, vision changes.   PHYSICAL EXAMINATION:  VITAL SIGNS: Blood pressure is 154/90, heart rate 89.  GENERAL: This patient is well-developed black female who is alert. Seems to  be confused at the time of examination but will follow verbal commands.  HEENT: Head is atraumatic. Eyes: Pupils are equal, round, and reactive to  light. Disk are soft and flat bilaterally.  NECK: Supple. No carotid bruits.  RESPIRATORY: Clear.  CARDIOVASCULAR: Regular rate and rhythm. No obvious murmurs or rubs noted.  ABDOMEN: Somewhat distended. Decreased bowel sounds noted. No tenderness is  noted.  EXTREMITIES: Without significant edema.  NEUROLOGICAL: Cranial nerves as above. Facial symmetry is present. The  patient tends to protrude the tongue a little bit to the left. Extraocular  movements are full. The patient appears to have  full visual fields. Blinks  to threat bilaterally. The patient claims good symmetry to pinprick  sensation on the face. Has symmetric facial expressions. Motor testing  reveals that the patient has symmetric strength of the upper extremities. He  has what appears to be some mild asterixis bilaterally and lifts the right  leg up off the bed a little bit better than the left. The patient claims to  have symmetric pinprick vibratory sensation throughout. The patient really  will not follow commands very well for cerebellar testing. She was not  ambulated. Deep tendon reflexes are depressed but symmetric. Toes are  neutral bilaterally.   LABORATORY DATA:  Notable for INR of 1.1. White count 7.8, hemoglobin of  13.0, hematocrit of 37.4, MCV of 88.8, platelets of 315,000. Sodium 134,  potassium 3.3, chloride 101, CO2 30, glucose of 115, BUN of 17, creatinine  of 1.2, calcium 8.9, total protein 7.7, albumin of 4.1, AST of 35, ALT 32,  alkaline phosphate of 48, total bilirubin of 0.6. CT of the head shows no  acute changes, mild to moderate small vessel ischemic changes noted. Old  bilateral thalamic and vasoganglia lacunar infarcts noted.   EKG reveals normal sinus rhythm, normal EKG, heart rate of 83. Chest x-ray  is pending.   IMPRESSION:  1.  Episodic spells of pain and confusion, etiology unclear, rule out      seizure type events.  2.  History of hypertension.   This patient comes in today with an episode that is similar but more severe  than her usual episodes of pain and confusion. The description of the event  is somewhat atypical for a true stroke spell. At the current time, the  patient is without pain and remains somewhat confused, has the ability to  follow commands but is not speaking well. I suppose a stroke does need to be  considered but given her history, seizures are more likely.   PLAN:  1.  Admission to [facility] 2.  MRI of the brain.  3.  MR angiogram.   4.  EGG study.  5.  Seizure precautions.  6.  Urine drug screen.   We will follow the patient closely while in house.      Marlan Palau, M.D.  Electronically Signed     CKW/MEDQ  D:  01/10/2006  T:  01/10/2006  Job:  [id number]   cc:   [facility]  [address]  Para March, M.D.  Family Practice Service

## 2011-08-08 LAB — CBC
HCT: 35.6 — ABNORMAL LOW
Hemoglobin: 12.5
MCHC: 34.8
MCHC: 35.5
MCV: 89
MCV: 89.2
Platelets: 262
Platelets: 267
RBC: 3.6 — ABNORMAL LOW
RBC: 4.1
RDW: 13.3
WBC: 5.4
WBC: 6
WBC: 6.7
WBC: 7

## 2011-08-08 LAB — BASIC METABOLIC PANEL
BUN: 6
CO2: 23
CO2: 27
CO2: 29
Calcium: 9.3
Chloride: 106
Chloride: 106
Chloride: 111
Creatinine, Ser: 0.97
Creatinine, Ser: 0.99
GFR calc Af Amer: >60
GFR calc Af Amer: >60
Glucose, Bld: 111 — ABNORMAL HIGH
Potassium: 3.2 — ABNORMAL LOW
Sodium: 139
Sodium: 140
Sodium: 141

## 2011-08-08 LAB — DIFFERENTIAL
Basophils Absolute: 0
Basophils Relative: 0
Eosinophils Absolute: 0
Monocytes Relative: 8
Neutrophils Relative %: 65

## 2011-08-08 LAB — POCT CARDIAC MARKERS
Myoglobin, poc: 152
Myoglobin, poc: 309
Operator id: 208461
Operator id: 215201

## 2011-08-08 LAB — CARDIAC PANEL(CRET KIN+CKTOT+MB+TROPI)
CK, MB: 14 — ABNORMAL HIGH
CK, MB: 25 — ABNORMAL HIGH
CK, MB: 6 — ABNORMAL HIGH
Relative Index: 8.3 — ABNORMAL HIGH
Total CK: 102
Total CK: 128
Total CK: 230 — ABNORMAL HIGH
Troponin I: 0.03
Troponin I: 1.56

## 2011-08-08 LAB — COMPREHENSIVE METABOLIC PANEL
ALT: 29
Alkaline Phosphatase: 55
GFR calc Af Amer: 40 — ABNORMAL LOW
GFR calc non Af Amer: 33 — ABNORMAL LOW
Total Bilirubin: 0.6

## 2011-08-08 LAB — PROTIME-INR
INR: 1.1
Prothrombin Time: 14

## 2011-08-08 LAB — LIPID PANEL
HDL: 48
LDL Cholesterol: 83
Total CHOL/HDL Ratio: 3.2
Triglycerides: 122
VLDL: 24

## 2011-08-08 LAB — MAGNESIUM: Magnesium: 2

## 2011-08-08 LAB — HEMOGLOBIN A1C: Mean Plasma Glucose: 126

## 2011-08-15 LAB — POCT CARDIAC MARKERS
CKMB, poc: 1.2
Operator id: 236211
Troponin i, poc: <0.05

## 2011-08-15 LAB — POCT I-STAT, CHEM 8
Calcium, Ion: 1.19
Chloride: 106
Glucose, Bld: 113 — ABNORMAL HIGH
HCT: 38
Hemoglobin: 12.9

## 2011-08-15 LAB — CARBAMAZEPINE LEVEL, TOTAL: Carbamazepine Lvl: <2 — ABNORMAL LOW

## 2011-08-19 LAB — CBC
HCT: 35.7 % — ABNORMAL LOW (ref 36.0–46.0)
Platelets: 196 10*3/uL (ref 150–400)
RDW: 12.8 % (ref 11.5–15.5)

## 2011-08-19 LAB — GLUCOSE, CAPILLARY: Glucose-Capillary: 119 mg/dL — ABNORMAL HIGH (ref 70–99)

## 2011-08-19 LAB — POCT I-STAT, CHEM 8
Calcium, Ion: 1.07 mmol/L — ABNORMAL LOW (ref 1.12–1.32)
Chloride: 105 mEq/L (ref 96–112)
HCT: 37 % (ref 36.0–46.0)
Sodium: 138 mEq/L (ref 135–145)
TCO2: 27 mmol/L (ref 0–100)

## 2011-08-19 LAB — DIFFERENTIAL
Basophils Absolute: 0.1 10*3/uL (ref 0.0–0.1)
Eosinophils Absolute: 0 10*3/uL (ref 0.0–0.7)
Eosinophils Relative: 0 % (ref 0–5)
Lymphocytes Relative: 24 % (ref 12–46)
Neutrophils Relative %: 68 % (ref 43–77)

## 2011-08-19 LAB — URINALYSIS, ROUTINE W REFLEX MICROSCOPIC
Bilirubin Urine: NEGATIVE
Nitrite: NEGATIVE
Specific Gravity, Urine: 1.013 (ref 1.005–1.030)
Urobilinogen, UA: 1 mg/dL (ref 0.0–1.0)
pH: 6 (ref 5.0–8.0)

## 2011-08-19 LAB — URINE MICROSCOPIC-ADD ON

## 2011-08-19 LAB — RAPID URINE DRUG SCREEN, HOSP PERFORMED
Amphetamines: NOT DETECTED
Opiates: NOT DETECTED
Tetrahydrocannabinol: POSITIVE — AB

## 2011-08-19 LAB — CARBAMAZEPINE LEVEL, TOTAL: Carbamazepine Lvl: 7.4 ug/mL (ref 4.0–12.0)

## 2011-09-01 LAB — BASIC METABOLIC PANEL
BUN: 17
Calcium: 8.7
Creatinine, Ser: 0.87
GFR calc non Af Amer: >60

## 2011-09-01 LAB — DIFFERENTIAL
Basophils Absolute: 0
Basophils Relative: 0
Lymphocytes Relative: 37
Monocytes Absolute: 0.6
Neutro Abs: 3.3
Neutrophils Relative %: 51

## 2011-09-01 LAB — CBC
MCV: 88.7
Platelets: 285
WBC: 6.5

## 2011-09-01 LAB — RAPID URINE DRUG SCREEN, HOSP PERFORMED
Amphetamines: NOT DETECTED
Barbiturates: NOT DETECTED
Opiates: NOT DETECTED

## 2011-09-01 LAB — URINALYSIS, ROUTINE W REFLEX MICROSCOPIC
Ketones, ur: NEGATIVE
Nitrite: NEGATIVE
Protein, ur: NEGATIVE

## 2011-09-01 LAB — CARBAMAZEPINE LEVEL, TOTAL: Carbamazepine Lvl: <2 — ABNORMAL LOW

## 2011-09-04 LAB — POCT CARDIAC MARKERS
CKMB, poc: <1 — ABNORMAL LOW
Myoglobin, poc: 59.3
Troponin i, poc: <0.05

## 2011-09-04 LAB — RAPID URINE DRUG SCREEN, HOSP PERFORMED
Amphetamines: NOT DETECTED
Barbiturates: NOT DETECTED
Benzodiazepines: NOT DETECTED
Cocaine: POSITIVE — AB
Opiates: NOT DETECTED
Tetrahydrocannabinol: POSITIVE — AB

## 2011-09-04 LAB — DIFFERENTIAL
Eosinophils Absolute: 0.1
Eosinophils Relative: 1
Lymphs Abs: 2.5
Monocytes Absolute: 0.6
Monocytes Relative: 9

## 2011-09-04 LAB — TROPONIN I: Troponin I: 0.02

## 2011-09-04 LAB — HOMOCYSTEINE: Homocysteine: 8.3

## 2011-09-04 LAB — URINALYSIS, ROUTINE W REFLEX MICROSCOPIC
Glucose, UA: NEGATIVE
Hgb urine dipstick: NEGATIVE
Specific Gravity, Urine: 1.02
Urobilinogen, UA: 0.2
pH: 5.5

## 2011-09-04 LAB — COMPREHENSIVE METABOLIC PANEL
ALT: 25
AST: 31
Alkaline Phosphatase: 43
CO2: 26
Calcium: 9.2
GFR calc Af Amer: >60
Potassium: 3.6
Sodium: 138
Total Protein: 7.1

## 2011-09-04 LAB — CK TOTAL AND CKMB (NOT AT ARMC): Total CK: 60

## 2011-09-04 LAB — CBC
MCHC: 34.8
RBC: 4.13
RDW: 13.1

## 2011-09-04 LAB — LIPID PANEL: Triglycerides: 472 — ABNORMAL HIGH

## 2011-09-04 LAB — CARBAMAZEPINE LEVEL, TOTAL: Carbamazepine Lvl: <2 — ABNORMAL LOW

## 2011-09-10 ENCOUNTER — Other Ambulatory Visit (HOSPITAL_COMMUNITY): Payer: Self-pay | Admitting: Cardiology

## 2011-09-24 ENCOUNTER — Other Ambulatory Visit (HOSPITAL_COMMUNITY): Payer: Self-pay

## 2011-10-03 ENCOUNTER — Other Ambulatory Visit (HOSPITAL_COMMUNITY): Payer: Self-pay

## 2011-10-16 ENCOUNTER — Other Ambulatory Visit (HOSPITAL_COMMUNITY): Payer: Self-pay | Admitting: Cardiology

## 2011-10-24 ENCOUNTER — Encounter (HOSPITAL_COMMUNITY)
Admission: RE | Admit: 2011-10-24 | Discharge: 2011-10-24 | Disposition: A | Discharge status: Home / Self Care | Payer: Medicaid Other | Source: Ambulatory Visit | Attending: Cardiology | Admitting: Cardiology

## 2011-10-24 ENCOUNTER — Ambulatory Visit (HOSPITAL_COMMUNITY)
Admission: RE | Admit: 2011-10-24 | Discharge: 2011-10-24 | Disposition: A | Discharge status: Home / Self Care | Payer: Medicaid Other | Source: Ambulatory Visit | Attending: Cardiology | Admitting: Cardiology

## 2011-10-24 DIAGNOSIS — I498 Other specified cardiac arrhythmias: Secondary | ICD-10-CM | POA: Insufficient documentation

## 2011-10-24 DIAGNOSIS — R0602 Shortness of breath: Secondary | ICD-10-CM | POA: Insufficient documentation

## 2011-10-24 DIAGNOSIS — I10 Essential (primary) hypertension: Secondary | ICD-10-CM | POA: Insufficient documentation

## 2011-10-24 DIAGNOSIS — I2510 Atherosclerotic heart disease of native coronary artery without angina pectoris: Secondary | ICD-10-CM | POA: Insufficient documentation

## 2011-10-24 DIAGNOSIS — M79609 Pain in unspecified limb: Secondary | ICD-10-CM | POA: Insufficient documentation

## 2011-10-24 DIAGNOSIS — R079 Chest pain, unspecified: Secondary | ICD-10-CM | POA: Insufficient documentation

## 2011-10-24 DIAGNOSIS — I252 Old myocardial infarction: Secondary | ICD-10-CM | POA: Insufficient documentation

## 2011-10-24 DIAGNOSIS — Z8679 Personal history of other diseases of the circulatory system: Secondary | ICD-10-CM | POA: Insufficient documentation

## 2011-10-24 MED ORDER — TECHNETIUM TC 99M TETROFOSMIN IV KIT
10.0000 | PACK | Freq: Once | INTRAVENOUS | Status: AC | PRN
  Administered 2011-10-24: 10 via INTRAVENOUS

## 2011-10-24 MED ORDER — TECHNETIUM TC 99M TETROFOSMIN IV KIT
30.0000 | PACK | Freq: Once | INTRAVENOUS | Status: AC | PRN
  Administered 2011-10-24: 30 via INTRAVENOUS

## 2011-10-24 MED ORDER — REGADENOSON 0.4 MG/5ML IV SOLN
0.4000 mg | Freq: Once | INTRAVENOUS | Status: AC
  Administered 2011-10-24: 0.4 mg via INTRAVENOUS

## 2011-10-24 MED ORDER — REGADENOSON 0.4 MG/5ML IV SOLN
INTRAVENOUS | Status: AC
  Administered 2011-10-24: 10:00:00
  Filled 2011-10-24: qty 5

## 2011-12-11 ENCOUNTER — Other Ambulatory Visit: Payer: Self-pay | Admitting: Family Medicine

## 2011-12-11 ENCOUNTER — Encounter: Payer: Self-pay | Admitting: Family Medicine

## 2011-12-11 ENCOUNTER — Ambulatory Visit (INDEPENDENT_AMBULATORY_CARE_PROVIDER_SITE_OTHER): Payer: Medicaid Other | Admitting: Family Medicine

## 2011-12-11 DIAGNOSIS — E785 Hyperlipidemia, unspecified: Secondary | ICD-10-CM

## 2011-12-11 DIAGNOSIS — Z23 Encounter for immunization: Secondary | ICD-10-CM

## 2011-12-11 DIAGNOSIS — F17200 Nicotine dependence, unspecified, uncomplicated: Secondary | ICD-10-CM

## 2011-12-11 DIAGNOSIS — D649 Anemia, unspecified: Secondary | ICD-10-CM

## 2011-12-11 DIAGNOSIS — Z780 Asymptomatic menopausal state: Secondary | ICD-10-CM

## 2011-12-11 DIAGNOSIS — Z1231 Encounter for screening mammogram for malignant neoplasm of breast: Secondary | ICD-10-CM

## 2011-12-11 DIAGNOSIS — Z0000 Encounter for general adult medical examination without abnormal findings: Secondary | ICD-10-CM

## 2011-12-11 DIAGNOSIS — I10 Essential (primary) hypertension: Secondary | ICD-10-CM

## 2011-12-11 DIAGNOSIS — I219 Acute myocardial infarction, unspecified: Secondary | ICD-10-CM

## 2011-12-11 DIAGNOSIS — F1420 Cocaine dependence, uncomplicated: Secondary | ICD-10-CM

## 2011-12-11 NOTE — Progress Notes (Signed)
  Subjective:    Patient ID: [name], female    DOB: 06/05/1956, 56 y.o.   MRN: [id number]  HPI Pt here to meet new MD and here for annual physical:  Last annual exam done in [place] at another providers office we do not have those records.  PHM, surgical history, FH, SH, meds, allergies updated under appropriate tabs.   Pt confused about what bp mediations to take.  Unsure if still on coreg.  Has rx for losartan/hctz but hasn't started this yet.  Also taking hctz daily.  Would like me to get update med list from cardiologist- Dr. Particia Jasper  No concerns at today's visit:  Health maintanence: Pt has never had mammogram or bone scan or colonoscopy.  Pt refuses flu vaccine. Has not had blood work in system for past 2-3 years- on statin, h/o anemia, on bp medications with hypertention-- no recent liver enzymes, creatinine, or hgb.  Has not had pneumonia shot- agrees to get this today.    Review of Systems As per above. No sob.  No cp.  No lower ext edema.  No rashes.  No breast nodules, lumps or discharge.  No dark or bloody stools.     Objective:   Physical Exam  Constitutional: She appears well-developed and well-nourished.  HENT:  Head: Normocephalic and atraumatic.  Right Ear: External ear normal.  Left Ear: External ear normal.       TM normal bilateral  Eyes: Pupils are equal, round, and reactive to light.  Neck: Normal range of motion. No thyromegaly present.  Cardiovascular: Normal rate, regular rhythm and normal heart sounds.   No murmur heard. Pulmonary/Chest: Effort normal. No respiratory distress. She has no wheezes. She has no rales.  Abdominal: Soft. She exhibits no distension.  Musculoskeletal: She exhibits no edema.  Neurological: She is alert.  Skin: No rash noted.  Psychiatric: She has a normal mood and affect.          Assessment & Plan:

## 2011-12-11 NOTE — Patient Instructions (Addendum)
Health screening: I will request the bone scan and the colonscopy.-- we will call you with those appoitments Please call and make a mammogram appt- see handout We will give you the pneumonia shot today. I will do some blood work to check your kidneys, liver, and cholesterol and I will you the results.   Blood pressure: Once you get the new prescription filled from your cardiologist- stop the hydrocholorathiazide   Return to see me in 2-3 weeks: To f/up on blood pressure and talk more about smoking.

## 2011-12-12 LAB — CBC
Hemoglobin: 13.1 g/dL (ref 12.0–15.0)
MCH: 30.5 pg (ref 26.0–34.0)
Platelets: 273 10*3/uL (ref 150–400)
RBC: 4.3 MIL/uL (ref 3.87–5.11)
WBC: 7.2 10*3/uL (ref 4.0–10.5)

## 2011-12-12 LAB — COMPREHENSIVE METABOLIC PANEL
ALT: 17 U/L (ref 0–35)
Albumin: 4.5 g/dL (ref 3.5–5.2)
CO2: 25 mEq/L (ref 19–32)
Calcium: 9.9 mg/dL (ref 8.4–10.5)
Chloride: 106 mEq/L (ref 96–112)
Glucose, Bld: 99 mg/dL (ref 70–99)
Potassium: 3.7 mEq/L (ref 3.5–5.3)
Sodium: 143 mEq/L (ref 135–145)
Total Protein: 7.5 g/dL (ref 6.0–8.3)

## 2011-12-13 ENCOUNTER — Encounter: Payer: Self-pay | Admitting: Family Medicine

## 2011-12-13 DIAGNOSIS — E785 Hyperlipidemia, unspecified: Secondary | ICD-10-CM | POA: Insufficient documentation

## 2011-12-13 DIAGNOSIS — E782 Mixed hyperlipidemia: Secondary | ICD-10-CM | POA: Insufficient documentation

## 2011-12-15 DIAGNOSIS — Z0000 Encounter for general adult medical examination without abnormal findings: Secondary | ICD-10-CM | POA: Insufficient documentation

## 2011-12-15 NOTE — Assessment & Plan Note (Addendum)
Pt unsure of what medciations she should be on currently.  Not taking coreg-- thought she was told to stop by Harwani/cardiology.  Yet, still on med list.  With pt h/o MI I would assume that this is a needed medication.  I will request pt last visit note from harwani to review recommended medications.  Currently switching from HCTZ to Hyzaar.  Also taking norvasc.  Pt to bring all medications to next appt and will compare to cards consult note to get med list cleaned up and titrate up on bp meds as needed.  Also need to ensure that pt on all needed meds since h/o of MI.  Pt to return in 2-3 weeks for recheck.

## 2011-12-15 NOTE — Assessment & Plan Note (Signed)
Encouraged smoking cessation 

## 2011-12-15 NOTE — Assessment & Plan Note (Addendum)
Pt has never had mammogram or bone scan-- will schedule these for same day at [facility] center.  Has not had a colonoscopy- agrees to this screen-- I will place order for GI consult.  Pt refuses flu vaccine.  Has not had blood work in system for past 2-3 years- on statin, h/o anemia, on bp medications with hypertention-- no recent liver enzymes, creatinine, or hgb. -- Will draw today. Has not had pneumonia shot- agrees to get this today.  Needs to get up to date on pap smear.

## 2011-12-15 NOTE — Assessment & Plan Note (Signed)
Pt not fasting today but will obtain LDL.

## 2011-12-17 ENCOUNTER — Telehealth: Payer: Self-pay | Admitting: Family Medicine

## 2011-12-17 ENCOUNTER — Telehealth: Payer: Self-pay | Admitting: *Deleted

## 2011-12-17 NOTE — Telephone Encounter (Signed)
[name] is returning [place] call.

## 2011-12-17 NOTE — Telephone Encounter (Signed)
Returned patient's call, she said someone called and left a message to return call but doesn't know who it was. I do not see any message where patient was called. I did call earlier today to her cardiologist to have records faxed over.[name], [name]

## 2011-12-17 NOTE — Telephone Encounter (Signed)
Message copied by Jennette Bill on Wed Dec 17, 2011  2:11 PM ------      Message from: Lorita Officer      Created: Tue Dec 16, 2011 11:58 AM                   ----- Message -----         From: Ellin Mayhew, MD         Sent: 12/15/2011   7:47 AM           To: [facility]            Can you please request last office note from Dr. Sharyn Lull- cardiology?  I can't seem to find in epic.  Thanks, [employer]

## 2011-12-17 NOTE — Telephone Encounter (Signed)
Called and requested records from Dr Annitta Jersey office.Busick, Rodena Medin

## 2011-12-23 ENCOUNTER — Other Ambulatory Visit: Payer: Medicaid Other

## 2011-12-23 ENCOUNTER — Ambulatory Visit: Payer: Medicaid Other

## 2011-12-25 ENCOUNTER — Encounter: Payer: Self-pay | Admitting: Family Medicine

## 2011-12-25 ENCOUNTER — Ambulatory Visit (INDEPENDENT_AMBULATORY_CARE_PROVIDER_SITE_OTHER): Payer: Medicaid Other | Admitting: Family Medicine

## 2011-12-25 ENCOUNTER — Ambulatory Visit: Payer: Medicaid Other | Admitting: Family Medicine

## 2011-12-25 DIAGNOSIS — I10 Essential (primary) hypertension: Secondary | ICD-10-CM

## 2011-12-25 DIAGNOSIS — F17200 Nicotine dependence, unspecified, uncomplicated: Secondary | ICD-10-CM

## 2011-12-25 DIAGNOSIS — J069 Acute upper respiratory infection, unspecified: Secondary | ICD-10-CM | POA: Insufficient documentation

## 2011-12-25 NOTE — Patient Instructions (Signed)
I think you have the flu: For body aches and fever-- tylenol 650mg  3 x day.  For cough--mucinex Drink lots of fluids!!!  Eat small amounts as your are able.   High blood pressure: Please bring all of your medicines  Smoking: Great job quitting!!

## 2011-12-25 NOTE — Progress Notes (Signed)
  Subjective:    Patient ID: [name], female    DOB: [birth year], 56 y.o.   MRN: [id number]  HPI Flu like symptoms: Pt reports + fever, chills, sweats, eye watering, nose running,decreased appetite, cough x 2 days.  Pt states that symptoms hit her all at once.  No sick contact.  Nothing seems to make it better or worse.  Taking mucinex that seems to help just a little bit with cough. Drinking fluids well.  No n/v/d.  No weakness. No falls.   Tobacco use: Quit 3 weeks ago.  Hasn't been using too much recently.    HTN: F/up today bp 135/85.  Pt states she is taking all of BP medications as directed.  No dizziness. No syncope.    Review of Systems As per above.     Objective:   Physical Exam  HENT:  Head: Normocephalic and atraumatic.  Right Ear: External ear normal.  Left Ear: External ear normal.  Mouth/Throat: Oropharynx is clear and moist. No oropharyngeal exudate.  Eyes: Pupils are equal, round, and reactive to light. Right eye exhibits no discharge. Left eye exhibits no discharge.  Neck: Normal range of motion. No tracheal deviation present. No thyromegaly present.  Cardiovascular: Normal rate, regular rhythm and normal heart sounds.   No murmur heard. Pulmonary/Chest: Effort normal and breath sounds normal. No respiratory distress. She has no wheezes. She has no rales.  Abdominal: Soft. She exhibits no distension.  Musculoskeletal: She exhibits no edema.       Normal strength in all 4 ext.   Lymphadenopathy:    She has no cervical adenopathy.  Neurological: She is alert.  Skin: No rash noted.          Assessment & Plan:

## 2011-12-25 NOTE — Assessment & Plan Note (Signed)
PT bp boarder line.  Elevated at last visit.  Will continue to monitor.  Pt did not bring medications with her today.  Pt to bring to next appt.

## 2011-12-25 NOTE — Assessment & Plan Note (Addendum)
Pt states she has not smoke a cigarette in greater than 1 month. Encouraged pt to keep up the good work with smoking cessation.

## 2011-12-25 NOTE — Assessment & Plan Note (Signed)
Symptomatic treatment only at this time.   Pt given red flags for return.

## 2012-01-08 ENCOUNTER — Encounter: Payer: Self-pay | Admitting: *Deleted

## 2012-01-14 ENCOUNTER — Ambulatory Visit (INDEPENDENT_AMBULATORY_CARE_PROVIDER_SITE_OTHER): Payer: Medicaid Other | Admitting: Gastroenterology

## 2012-01-14 ENCOUNTER — Other Ambulatory Visit: Payer: Medicaid Other

## 2012-01-14 ENCOUNTER — Telehealth: Payer: Self-pay | Admitting: *Deleted

## 2012-01-14 ENCOUNTER — Encounter: Payer: Self-pay | Admitting: Gastroenterology

## 2012-01-14 ENCOUNTER — Ambulatory Visit: Payer: Medicaid Other

## 2012-01-14 DIAGNOSIS — I2510 Atherosclerotic heart disease of native coronary artery without angina pectoris: Secondary | ICD-10-CM

## 2012-01-14 DIAGNOSIS — Z1211 Encounter for screening for malignant neoplasm of colon: Secondary | ICD-10-CM

## 2012-01-14 HISTORY — DX: Atherosclerotic heart disease of native coronary artery without angina pectoris: I25.10

## 2012-01-14 MED ORDER — PEG-KCL-NACL-NASULF-NA ASC-C 100 G PO SOLR: 1.0000 | Freq: Once | ORAL | Status: DC

## 2012-01-14 NOTE — Progress Notes (Signed)
History of Present Illness: [name] is a 56 year old Afro-American female with history of CVA and MI referred at the request of Dr. Edmonia James for screening colonoscopy. She has no GI complaints including change of bowel habits, abdominal pain, melena or hematochezia. She is on Plavix.   Past Medical History  Diagnosis Date  . Adjustment disorder with depressed mood 01/11/2009  . CAD 07/10/2008  . COCAINE DEPENDENCY NOS 01/14/2007  . CONVULSIONS, SEIZURES, NOS 01/14/2007  . CVA 01/14/2007  . HEPATITIS C 02/26/2009  . HYPERTENSION, BENIGN SYSTEMIC 01/14/2007  . TOBACCO DEPENDENCE 01/14/2007  . MI (myocardial infarction) 12/20/2007    had stent placed  . Stroke    Past Surgical History  Procedure Date  . Coronary angioplasty with stent placement   . Cesarean section    family history includes Colon cancer in her paternal uncle; Heart disease in her sister; Liver cancer in her father; and Stroke in her mother. Current Outpatient Prescriptions  Medication Sig Dispense Refill  . amLODipine (NORVASC) 5 MG tablet Take 5 mg by mouth daily.      [name] aspirin 81 MG EC tablet Take 81 mg by mouth daily.        . carbamazepine (TEGRETOL) 200 MG tablet Take 200 mg by mouth 2 (two) times daily.        . carvedilol (COREG) 3.125 MG tablet Take 3.125 mg by mouth 2 (two) times daily.        . clopidogrel (PLAVIX) 75 MG tablet Take 75 mg by mouth daily.        . cyclobenzaprine (FLEXERIL) 10 MG tablet 1/2 tab by mouth at bedtime as needed muscle spasms       . hydrochlorothiazide (HYDRODIURIL) 25 MG tablet Take 25 mg by mouth daily.      . nitroGLYCERIN (NITROSTAT) 0.4 MG SL tablet Place 0.4 mg under the tongue every 5 (five) minutes as needed.       Allergies as of 01/14/2012  . (No Known Allergies)    reports that she has quit smoking. She has never used smokeless tobacco. She reports that she does not drink alcohol or use illicit drugs.     Review of Systems:  She is weak on the right side secondary to  her stroke and requires a cane Pertinent positive and negative review of systems were noted in the above HPI section. All other review of systems were otherwise negative.  Vital signs were reviewed in today's medical record Physical Exam: General: Well developed , well nourished, no acute distress Head: Normocephalic and atraumatic Eyes:  sclerae anicteric, EOMI Ears: Normal auditory acuity Mouth: No deformity or lesions Neck: Supple, no masses or thyromegaly Lungs: Clear throughout to auscultation Heart: Regular rate and rhythm; no murmurs, rubs or bruits Abdomen: Soft, non tender and non distended. No masses, hepatosplenomegaly or hernias noted. Normal Bowel sounds Rectal:deferred Musculoskeletal: Symmetrical with no gross deformities  Skin: No lesions on visible extremities Pulses:  Normal pulses noted Extremities: No clubbing, cyanosis, edema or deformities noted Neurological: Alert oriented x 4, grossly nonfocal Cervical Nodes:  No significant cervical adenopathy Inguinal Nodes: No significant inguinal adenopathy Psychological:  Alert and cooperative. Normal mood and affect

## 2012-01-14 NOTE — Assessment & Plan Note (Signed)
The patient will be scheduled for colonoscopy. I will check with her PCP whether we can hold Plavix

## 2012-01-14 NOTE — Patient Instructions (Addendum)
We have scheduled your colonoscopy for 01/20/2012 at 4pm We have sent your MoviPrep to your pharmacy today We will contact Dr Ellin Mayhew about holding your Plavix before your procedure If you do not hear from our office by Thursday afternoon please contact the office and ask for Calliope Delangel before 4:30pm

## 2012-01-14 NOTE — Telephone Encounter (Signed)
[facility] [address] [place] [phone] fax  [phone] phone   01/14/2012    RE: [name] DOB: 1956-10-25 MRN: [id number]   Dear Dr Edmonia James   We have scheduled the above patient for an endoscopic procedure. Our records show that she is on anticoagulation therapy.   Please advise as to how long the patient may come off her therapy of Plavix prior to the procedure, which is scheduled for 01/20/2012.  Please fax back/ or route the completed form to Belenda Alviar  at [phone].   Sincerely,  Merri Ray

## 2012-01-15 ENCOUNTER — Ambulatory Visit: Payer: Medicaid Other | Admitting: Family Medicine

## 2012-01-15 ENCOUNTER — Telehealth: Payer: Self-pay | Admitting: *Deleted

## 2012-01-15 NOTE — Telephone Encounter (Signed)
Consulted with Dr. Leveda Anna in Dr. Edmonia James' absence and he advises for patient to stop Plavix  today .  Patient notified . She will receive instructions about restarting Plavix after colonoscopy procedure,  from  GI.

## 2012-01-15 NOTE — Telephone Encounter (Signed)
Patient is also on ASA 81 mg daily. Consulted with Dr. Mauricio Po and he advises to stop this also prior to colonscopy.[name]

## 2012-01-15 NOTE — Telephone Encounter (Signed)
Called [name] to get Dr Tobias Alexander phone number so I can get OK for [name] to come off Plavix. All numbers I call are not correct

## 2012-01-15 NOTE — Telephone Encounter (Signed)
Dr. Mauricio Po consulted because patient is also on ASA 81 mg daily.  Advised to stop this also prior to colonoscopy.  Patient notified.

## 2012-01-15 NOTE — Telephone Encounter (Signed)
Patient was originally scheduled an appointment with Dr. Edmonia James today for a follow up . However the appointment was cancelled thru our office and we were unable to get in touch with patient to notify  her.  She comes to office today stating she is scheduled for a colonoscopy 01/20/2012 and GI doctor told her to check with her PCP about Plavix.  Will  check with preceptor in Dr. Edmonia James absence.

## 2012-01-15 NOTE — Telephone Encounter (Signed)
Dr Edmonia James, Please respond so patient can hold plavix before Tuesday

## 2012-01-15 NOTE — Telephone Encounter (Signed)
Consulted with Dr. Leveda Anna and he advises for patient to stop Plavix today .  Will receive instructions about restarting from GI doctor.  Patient notified.  Also spoke with Merri Ray CMA  of [facility]  and gave her this information.

## 2012-01-20 ENCOUNTER — Encounter: Payer: Self-pay | Admitting: Gastroenterology

## 2012-01-20 ENCOUNTER — Ambulatory Visit (AMBULATORY_SURGERY_CENTER): Payer: Medicaid Other | Admitting: Gastroenterology

## 2012-01-20 VITALS — BP 137/70 | HR 69 | Temp 98.5°F | Resp 19 | Ht 64.0 in | Wt 173.0 lb

## 2012-01-20 DIAGNOSIS — D128 Benign neoplasm of rectum: Secondary | ICD-10-CM

## 2012-01-20 DIAGNOSIS — D126 Benign neoplasm of colon, unspecified: Secondary | ICD-10-CM

## 2012-01-20 DIAGNOSIS — Z1211 Encounter for screening for malignant neoplasm of colon: Secondary | ICD-10-CM

## 2012-01-20 DIAGNOSIS — D129 Benign neoplasm of anus and anal canal: Secondary | ICD-10-CM

## 2012-01-20 MED ORDER — SODIUM CHLORIDE 0.9 % IV SOLN: 500.0000 mL | INTRAVENOUS | Status: DC

## 2012-01-20 NOTE — Op Note (Signed)
[facility] [address] [place], [place]  [address]  COLONOSCOPY PROCEDURE REPORT  PATIENT:  Morgan, [name]  MR#:  [id number] BIRTHDATE:  Apr 20, 1956, 55 yrs. old  GENDER:  female ENDOSCOPIST:  Barbette Hair. Arlyce Dice, MD REF. BY: PROCEDURE DATE:  01/20/2012 PROCEDURE:  Colon with cold biopsy polypectomy ASA CLASS:  Class II INDICATIONS:  Routine Risk Screening MEDICATIONS:   MAC sedation, administered by CRNA propofol 200mg IV  DESCRIPTION OF PROCEDURE:   After the risks benefits and alternatives of the procedure were thoroughly explained, informed consent was obtained.  Digital rectal exam was performed and revealed no abnormalities.   The LB [id number] endoscope was introduced through the anus and advanced to the cecum, which was identified by both the appendix and ileocecal valve, without limitations.  The quality of the prep was good, using MoviPrep. The instrument was then slowly withdrawn as the colon was fully examined. <<PROCEDUREIMAGES>>  FINDINGS:  A diminutive polyp was found in the mid transverse colon. It was 2 mm in size. The polyp was removed using cold biopsy forceps (see image6).  This was otherwise a normal examination of the colon (see image3, image5, and image7). Retroflexed views in the rectum revealed no abnormalities.    The time to cecum =  1) 5.0  minutes. The scope was then withdrawn in 1) 10.50  minutes from the cecum and the procedure completed. COMPLICATIONS:  None ENDOSCOPIC IMPRESSION: 1) 2 mm diminutive polyp in the mid transverse colon 2) Otherwise normal examination RECOMMENDATIONS: 1) If the polyp(s) removed today are proven to be adenomatous (pre-cancerous) polyps, you will need a repeat colonoscopy in 5 years. Otherwise you should continue to follow colorectal cancer screening guidelines for "routine risk" patients with colonoscopy in 10 years. You will receive a letter within 1-2 weeks with the results of your biopsy as well as  final recommendations. Please call my office if you have not received a letter after 3 weeks. REPEAT EXAM:   You will receive a letter from Dr. Arlyce Dice in 1-2 weeks, after reviewing the final pathology, with followup recommendations.  ______________________________ Barbette Hair Arlyce Dice, MD  CC:  Ellin Mayhew MD  n. Rosalie DoctorBarbette Hair. [name] at 01/20/2012 03:39 PM  Vladimir Faster, [phone]

## 2012-01-20 NOTE — Patient Instructions (Addendum)
Resume plavix in am   YOU HAD AN ENDOSCOPIC PROCEDURE TODAY AT THE [facility]: Refer to the procedure report that was given to you for any specific questions about what was found during the examination.  If the procedure report does not answer your questions, please call your gastroenterologist to clarify.  If you requested that your care partner not be given the details of your procedure findings, then the procedure report has been included in a sealed envelope for you to review at your convenience later.  YOU SHOULD EXPECT: Some feelings of bloating in the abdomen. Passage of more gas than usual.  Walking can help get rid of the air that was put into your GI tract during the procedure and reduce the bloating. If you had a lower endoscopy (such as a colonoscopy or flexible sigmoidoscopy) you may notice spotting of blood in your stool or on the toilet paper. If you underwent a bowel prep for your procedure, then you may not have a normal bowel movement for a few days.  DIET: Your first meal following the procedure should be a light meal and then it is ok to progress to your normal diet.  A half-sandwich or bowl of soup is an example of a good first meal.  Heavy or fried foods are harder to digest and may make you feel nauseous or bloated.  Likewise meals heavy in dairy and vegetables can cause extra gas to form and this can also increase the bloating.  Drink plenty of fluids but you should avoid alcoholic beverages for 24 hours.  ACTIVITY: Your care partner should take you home directly after the procedure.  You should plan to take it easy, moving slowly for the rest of the day.  You can resume normal activity the day after the procedure however you should NOT DRIVE or use heavy machinery for 24 hours (because of the sedation medicines used during the test).    SYMPTOMS TO REPORT IMMEDIATELY: A gastroenterologist can be reached at any hour.  During normal business hours, 8:30 AM to 5:00 PM  Monday through Friday, call [phone].  After hours and on weekends, please call the GI answering service at [phone] who will take a message and have the physician on call contact you.   Following lower endoscopy (colonoscopy or flexible sigmoidoscopy):  Excessive amounts of blood in the stool  Significant tenderness or worsening of abdominal pains  Swelling of the abdomen that is new, acute  Fever of 100F or higher  Following upper endoscopy (EGD)  Vomiting of blood or coffee ground material  New chest pain or pain under the shoulder blades  Painful or persistently difficult swallowing  New shortness of breath  Fever of 100F or higher  Black, tarry-looking stools  FOLLOW UP: If any biopsies were taken you will be contacted by phone or by letter within the next 1-3 weeks.  Call your gastroenterologist if you have not heard about the biopsies in 3 weeks.  Our staff will call the home number listed on your records the next business day following your procedure to check on you and address any questions or concerns that you may have at that time regarding the information given to you following your procedure. This is a courtesy call and so if there is no answer at the home number and we have not heard from you through the emergency physician on call, we will assume that you have returned to your regular daily activities without incident.  SIGNATURES/CONFIDENTIALITY: You and/or your care partner have signed paperwork which will be entered into your electronic medical record.  These signatures attest to the fact that that the information above on your After Visit Summary has been reviewed and is understood.  Full responsibility of the confidentiality of this discharge information lies with you and/or your care-partner.    INFORMATION ON POLYPS GIVEN TO YOU

## 2012-01-20 NOTE — Progress Notes (Signed)
Patient did not experience any of the following events: a burn prior to discharge; a fall within the facility; wrong site/side/patient/procedure/implant event; or a hospital transfer or hospital admission upon discharge from the facility. (G8907) Patient did not have preoperative order for IV antibiotic SSI prophylaxis. (G8918)  

## 2012-01-21 ENCOUNTER — Telehealth: Payer: Self-pay | Admitting: *Deleted

## 2012-01-21 NOTE — Telephone Encounter (Signed)
  Follow up Call-  Call back number 01/20/2012  Post procedure Call Back phone  # [phone]  Permission to leave phone message Yes     Patient questions:  Do you have a fever, pain , or abdominal swelling? no Pain Score  0 *  Have you tolerated food without any problems? yes  Have you been able to return to your normal activities? yes  Do you have any questions about your discharge instructions: Diet   no Medications  no Follow up visit  no  Do you have questions or concerns about your Care? no  Actions: * If pain score is 4 or above: No action needed, pain <4.

## 2012-01-26 ENCOUNTER — Encounter: Payer: Self-pay | Admitting: Gastroenterology

## 2012-04-08 ENCOUNTER — Ambulatory Visit (INDEPENDENT_AMBULATORY_CARE_PROVIDER_SITE_OTHER): Payer: Medicaid Other | Admitting: Family Medicine

## 2012-04-08 ENCOUNTER — Encounter: Payer: Self-pay | Admitting: Family Medicine

## 2012-04-08 ENCOUNTER — Telehealth: Payer: Self-pay | Admitting: Family Medicine

## 2012-04-08 VITALS — BP 150/97 | HR 75 | Ht 64.5 in | Wt 178.0 lb

## 2012-04-08 DIAGNOSIS — H539 Unspecified visual disturbance: Secondary | ICD-10-CM

## 2012-04-08 NOTE — Assessment & Plan Note (Addendum)
Has been occuring slowly over time, per pt more rapidly during past 6 months.  Unable to get opthalmology exam without PCP referral.  Right eye 20/100 and left eye 20/120.  Vision changes most likely due to age related vision changes.  But could be due to other opthamologic cause.  Needs complete evaluation.  Also, per pt had some vision changes at time of CVA when it occurred a few years back-  Could also be CVA lesion in area that affects vision.  Since the vision changes have been chronic-and no acute changes recently- will first start with opthamology exam.  Will wait for their recommendations.  Pt to f/up with me after eye exam. Wrote referral as urgent.

## 2012-04-08 NOTE — Telephone Encounter (Signed)
error 

## 2012-04-08 NOTE — Progress Notes (Signed)
  Subjective:    Patient ID: [name], female    DOB: [birth year], 56 y.o.   MRN: [id number]  HPI Vision changes: Patient reports changes in vision off and on since her CVA in 2008. She states that vision changes 1 day will be clearer than other days. Has purchased multiple eyeglasses from the store depending on the day will need changes in the strength of the eyeglass prescription.  Patient states that she went to [facility] and was told she needed a referral.  Patient states in the 6 past month she has had worsening of her vision. Unable to see well to read or write. Also difficulty playing the keyboard at church due to poor vision.  No double vision. No nausea. No vomiting. No syncope. No dizziness. No falls. No rash. No cold symptoms. No weight changes. Has chronic weakness in right arm and leg due to previous CVA.-This has not worsened. Gait wide-based and uneven secondary to CVA in past. No changes in gait per patient. Has had to walk with cane since CVA.   Smoking status reviewed.   Review of Systems As per above.    Objective:   Physical Exam  Constitutional: She appears well-developed and well-nourished.  HENT:  Head: Normocephalic and atraumatic.  Mouth/Throat: Oropharynx is clear and moist.  Eyes: Conjunctivae and EOM are normal. Right eye exhibits no discharge. Left eye exhibits no discharge.       Mild reactivity of pupils bilateral to light- baseline unknown.   Unable to visualized optic discs clearly on fundoscopic exam.   Cardiovascular: Normal rate and regular rhythm.   Murmur heard. Pulmonary/Chest: Effort normal. No respiratory distress.  Musculoskeletal: She exhibits no edema.       Uneven wide base gait- walks with 4 prong cane.   All extremities 5/5 strength but right grip, arm, and leg all decreased in strength when compared to left.   Decreased peripheral vision- baseline exam unknown.   RAM- slow on left side, slow and decreased dexterity/clumsy  with right hand.    Shin to heel normal with right foot, unable to perform with right leg- ran down medial side of leg.   Neurological: She has normal reflexes.  Skin: No rash noted.  Psychiatric: She has a normal mood and affect.          Assessment & Plan:

## 2012-04-09 ENCOUNTER — Telehealth: Payer: Self-pay | Admitting: *Deleted

## 2012-04-09 NOTE — Telephone Encounter (Signed)
Called patient and explained that medicaid does not cover ophthalmology visits without a diagnosis of diabetes or some type of eye problem other than loss of vision. She said she does not have the money to pay for a visit at this time and is declining to see an eye doctor.[name], Rodena Medin

## 2012-04-15 ENCOUNTER — Telehealth: Payer: Self-pay | Admitting: *Deleted

## 2012-04-15 NOTE — Telephone Encounter (Signed)
Message copied by Jennette Bill on Thu Apr 15, 2012  9:06 AM ------      Message from: [place], [place] M      Created: Wed Apr 14, 2012  1:32 PM       Spoke with [facility]- they will see her- they said that medicaid should cover this.  They gave her an appointment on June 24th at 12:45pm.  Please call pt and let her know about her appointment time.  I have asked the clinic to have the doctors review the note b/c I would like to have her seen sooner since I feel that this is an urgent matter.  Will you please fax her office visit note for eye complaint to [facility]- fax # [phone]            Elmore Guise, can you please convert this note to a telephone note so we can refer back to it in the chart?                   ----- Message -----         From: Dorice Lamas, CMA         Sent: 04/13/2012   9:21 AM           To: Kristen Cardinal, MD            Patient has medicaid which does not cover ophthalmologist, she will have to pay $125 up front which she has declined to do at this time.Hilary Milks, Rodena Medin            ----- Message -----         From: Kristen Cardinal, MD         Sent: 04/08/2012  11:05 PM           To: [facility]            Place referral on this patient today- she prefers to have [facility] arranged [facility]- they couldn't see her without PCP evaluation. Thanks for your help with this patient.

## 2012-04-15 NOTE — Telephone Encounter (Signed)
Called and informed patient of appointment information. Also told her to possible expect a call from them for an earlier appointment. Faxed office notes to [facility].[name], Rodena Medin

## 2012-05-23 ENCOUNTER — Emergency Department (HOSPITAL_COMMUNITY)
Admission: EM | Admit: 2012-05-23 | Discharge: 2012-05-23 | Disposition: A | Discharge status: Home / Self Care | Payer: Medicaid Other | Attending: Emergency Medicine | Admitting: Emergency Medicine

## 2012-05-23 ENCOUNTER — Encounter (HOSPITAL_COMMUNITY): Payer: Self-pay | Admitting: Emergency Medicine

## 2012-05-23 ENCOUNTER — Emergency Department (HOSPITAL_COMMUNITY): Payer: Medicaid Other

## 2012-05-23 DIAGNOSIS — J4489 Other specified chronic obstructive pulmonary disease: Secondary | ICD-10-CM | POA: Insufficient documentation

## 2012-05-23 DIAGNOSIS — B1920 Unspecified viral hepatitis C without hepatic coma: Secondary | ICD-10-CM | POA: Insufficient documentation

## 2012-05-23 DIAGNOSIS — J449 Chronic obstructive pulmonary disease, unspecified: Secondary | ICD-10-CM | POA: Insufficient documentation

## 2012-05-23 DIAGNOSIS — M549 Dorsalgia, unspecified: Secondary | ICD-10-CM | POA: Insufficient documentation

## 2012-05-23 DIAGNOSIS — F17200 Nicotine dependence, unspecified, uncomplicated: Secondary | ICD-10-CM | POA: Insufficient documentation

## 2012-05-23 DIAGNOSIS — Z8673 Personal history of transient ischemic attack (TIA), and cerebral infarction without residual deficits: Secondary | ICD-10-CM | POA: Insufficient documentation

## 2012-05-23 DIAGNOSIS — I2510 Atherosclerotic heart disease of native coronary artery without angina pectoris: Secondary | ICD-10-CM | POA: Insufficient documentation

## 2012-05-23 DIAGNOSIS — F4310 Post-traumatic stress disorder, unspecified: Secondary | ICD-10-CM | POA: Insufficient documentation

## 2012-05-23 DIAGNOSIS — M25559 Pain in unspecified hip: Secondary | ICD-10-CM | POA: Insufficient documentation

## 2012-05-23 DIAGNOSIS — R569 Unspecified convulsions: Secondary | ICD-10-CM | POA: Insufficient documentation

## 2012-05-23 DIAGNOSIS — I10 Essential (primary) hypertension: Secondary | ICD-10-CM | POA: Insufficient documentation

## 2012-05-23 DIAGNOSIS — Z9861 Coronary angioplasty status: Secondary | ICD-10-CM | POA: Insufficient documentation

## 2012-05-23 DIAGNOSIS — M5432 Sciatica, left side: Secondary | ICD-10-CM

## 2012-05-23 DIAGNOSIS — M5430 Sciatica, unspecified side: Secondary | ICD-10-CM | POA: Insufficient documentation

## 2012-05-23 HISTORY — DX: Post-traumatic stress disorder, unspecified: F43.10

## 2012-05-23 MED ORDER — CYCLOBENZAPRINE HCL 10 MG PO TABS
10.0000 mg | ORAL_TABLET | Freq: Once | ORAL | Status: AC
  Administered 2012-05-23: 10 mg via ORAL
  Filled 2012-05-23: qty 1

## 2012-05-23 MED ORDER — OXYCODONE-ACETAMINOPHEN 5-325 MG PO TABS: 1.0000 | ORAL_TABLET | ORAL | Status: DC | PRN

## 2012-05-23 MED ORDER — OXYCODONE-ACETAMINOPHEN 5-325 MG PO TABS
1.0000 | ORAL_TABLET | Freq: Once | ORAL | Status: AC
  Administered 2012-05-23: 1 via ORAL
  Filled 2012-05-23: qty 1

## 2012-05-23 MED ORDER — CYCLOBENZAPRINE HCL 10 MG PO TABS: 10.0000 mg | ORAL_TABLET | Freq: Three times a day (TID) | ORAL | Status: AC | PRN

## 2012-05-23 NOTE — ED Provider Notes (Signed)
History   This chart was scribed for Morgan Booze, MD scribed by Magnus Sinning. The patient was seen in room TR07C/TR07C seen at 19:38   CSN: [id number]  Arrival date & time 05/23/12  1746   None     Chief Complaint  Patient presents with  . Hip Pain    (Consider location/radiation/quality/duration/timing/severity/associated sxs/prior treatment) HPI [name] is a 56 y.o. female who presents to the Emergency Department complaining of gradually worsening moderate back of left sharp hip pain that radiates into thighs and back, onset 2 weeks with associated difficulty ambulating, and difficulty sleeping due to inability to lay on left hip. She states pain is not relieved with tylenol and she denies injury. Reports hx of intermittent back and hip pain, but maintains that current pain is constant. Denies weakness, numbness, tingling, or incontinence. She describes pain is severe and rates it at 10/10.  Past Medical History  Diagnosis Date  . Adjustment disorder with depressed mood 01/11/2009  . CAD 07/10/2008  . COCAINE DEPENDENCY NOS 01/14/2007  . CONVULSIONS, SEIZURES, NOS 01/14/2007  . CVA 01/14/2007  . HEPATITIS C 02/26/2009  . HYPERTENSION, BENIGN SYSTEMIC 01/14/2007  . TOBACCO DEPENDENCE 01/14/2007  . MI (myocardial infarction) 12/20/2007    had stent placed  . Stroke   . Coronary artery disease 01/14/2012  . Asthma   . COPD (chronic obstructive pulmonary disease)   . PTSD (post-traumatic stress disorder)     Past Surgical History  Procedure Date  . Coronary angioplasty with stent placement   . Cesarean section     x 2    Family History  Problem Relation Age of Onset  . Stroke Mother   . Liver cancer Father     ??? with alcohol abuse  . Colon cancer Paternal Uncle   . Heart disease Sister   . Colon cancer Paternal Aunt     History  Substance Use Topics  . Smoking status: Current Everyday Smoker -- 0.5 packs/day  . Smokeless tobacco: Never Used  . Alcohol Use: No   quit 4 years ago   Review of Systems  Musculoskeletal: Positive for back pain.       Positive left hip pain    Allergies  Review of patient's allergies indicates no known allergies.  Home Medications   Current Outpatient Rx  Name Route Sig Dispense Refill  . AMLODIPINE BESYLATE 5 MG PO TABS Oral Take 5 mg by mouth daily.    . ASPIRIN 81 MG PO TBEC Oral Take 81 mg by mouth daily.      [name] CARBAMAZEPINE 200 MG PO TABS Oral Take 200 mg by mouth 2 (two) times daily.      [name] CARVEDILOL 3.125 MG PO TABS Oral Take 3.125 mg by mouth 2 (two) times daily.      [name] CLOPIDOGREL BISULFATE 75 MG PO TABS Oral Take 75 mg by mouth daily.      [name] NITROGLYCERIN 0.4 MG SL SUBL Sublingual Place 0.4 mg under the tongue every 5 (five) minutes as needed.      BP 115/83  Pulse 107  Temp 98.6 F (37 C) (Oral)  Resp 20  Ht 5' 4.5" (1.638 m)  Wt 186 lb (84.369 kg)  BMI 31.43 kg/m2  SpO2 94%  Physical Exam  Nursing note and vitals reviewed. Constitutional: She is oriented to person, place, and time. She appears well-developed and well-nourished. No distress.       Appears in pain.  HENT:  Head: Normocephalic and  atraumatic.  Eyes: EOM are normal.  Neck: Neck supple. No tracheal deviation present.  Cardiovascular: Normal rate.   Pulmonary/Chest: Effort normal. No respiratory distress.  Musculoskeletal: Normal range of motion. She exhibits tenderness.       Mild tenderness of lumbar spine and left para lumbar area. Positive straight leg at 45 degrees.  Neurological: She is alert and oriented to person, place, and time.  Skin: Skin is warm and dry.  Psychiatric: She has a normal mood and affect. Her behavior is normal.    ED Course  Procedures (including critical care time) DIAGNOSTIC STUDIES: Oxygen Saturation is 94% on room air, adequate by my interpretation.    COORDINATION OF CARE:  Dg Hip Complete Left  05/23/2012  *RADIOLOGY REPORT*  Clinical Data: Left hip pain.  No reported injury.  LEFT  HIP - COMPLETE 2+ VIEW  Comparison: None.  Findings: Normal appearing hips, pelvic bones and lower lumbar spine.  Right pelvic phleboliths.  IMPRESSION: Normal examination.  Original Report Authenticated By: Darrol Angel, M.D.     1. Left sided sciatica       MDM  Hip pain which is suspicious for sciatica. Hip x-ray is negative for acute injury. I see no evidence of acute neurologic injury. She will be discharged with prescriptions for cyclobenzaprine and Percocet.  I personally performed the services described in this documentation, which was scribed in my presence. The recorded information has been reviewed and considered.           Morgan Booze, MD 05/27/12 [phone]

## 2012-05-23 NOTE — ED Notes (Signed)
Pt reports L hip pain for 2 weeks, denies injury; reports now having difficulty walking; pt was ambulatory at triage; CMS intact distal to hip

## 2012-05-27 ENCOUNTER — Ambulatory Visit (INDEPENDENT_AMBULATORY_CARE_PROVIDER_SITE_OTHER): Payer: Medicaid Other | Admitting: Family Medicine

## 2012-05-27 ENCOUNTER — Encounter: Payer: Self-pay | Admitting: Family Medicine

## 2012-05-27 VITALS — BP 174/95 | HR 81 | Temp 97.6°F | Ht 64.5 in | Wt 182.0 lb

## 2012-05-27 DIAGNOSIS — M25552 Pain in left hip: Secondary | ICD-10-CM | POA: Insufficient documentation

## 2012-05-27 DIAGNOSIS — M25559 Pain in unspecified hip: Secondary | ICD-10-CM

## 2012-05-27 MED ORDER — MELOXICAM 15 MG PO TABS: 15.0000 mg | ORAL_TABLET | Freq: Every day | ORAL | Status: DC

## 2012-05-27 MED ORDER — KETOROLAC TROMETHAMINE 60 MG/2ML IM SOLN
60.0000 mg | Freq: Once | INTRAMUSCULAR | Status: AC
  Administered 2012-05-27: 60 mg via INTRAMUSCULAR

## 2012-05-27 MED ORDER — TRAMADOL HCL 50 MG PO TABS: 50.0000 mg | ORAL_TABLET | Freq: Three times a day (TID) | ORAL | Status: AC | PRN

## 2012-05-27 NOTE — Progress Notes (Signed)
  Subjective:    [name] is a 56 y.o. female who presents with left hip pain. Onset of the symptoms was 2 weeks ago. Inciting event: noneThe patient reports the hip pain is worse with weight bearing, is aggravated by walking and radiates to inner thigh and buttock.. Aggravating symptoms include: rising after sitting, standing, walking and flexion and extension of left LE. Patient has had no prior hip problems, however she ambulates with a walker after she had a CVA.  Evaluation to date: plain films, which were normal. Treatment to date: prescription analgesics, which have been not very effective.  Denies any associated fevers or chills, nausea or vomiting, bladder or bowel incontinence.  The following portions of the patient's history were reviewed and updated as appropriate: allergies, current medications, past family history, past medical history and problem list.   Review of Systems Pertinent items are noted in HPI.   Objective:    BP 174/95  Pulse 81  Temp 97.6 F (36.4 C) (Oral)  Ht 5' 4.5" (1.638 m)  Wt 182 lb (82.555 kg)  BMI 30.76 kg/m2 Right hip: normal  Left hip: Hip: Decreased ROM due to pain. Positive SLR 30 degrees only. Strength normal, sensation intact. Pelvic alignment unremarkable to inspection and palpation. Greater trochanter without tenderness to palpation. Tenderness on palpation of sacrum and piriformis. No SI joint tenderness and normal minimal SI movement.   Imaging: X-ray the left hip: no fracture, dislocation.   Assessment:    DJD Sciatica    Plan:    See Problem list.

## 2012-05-27 NOTE — Assessment & Plan Note (Addendum)
Patient was seen in ED 3 days ago, diagnosed with sciatica, and discharged home with Percocet and Flexeril. Pain is persistent and not relieved by Percocet or Flexeril.   Positive SLR 30 degrees, normal hip X-ray on 05/23/12. Will treat with Mobic daily and Tramadol TID PRN x 4 weeks. Will refer to PT. Follow up in 4 weeks or PRN if needed.  Will consider X-ray of LS spine if pain not improved in 4 weeks.

## 2012-05-27 NOTE — Patient Instructions (Addendum)
We will call you with time and date of physical therapy appointment. Pick up 2 prescriptions Tramadol and Mobic at your pharmacy and use as directed. Schedule follow up appointment in 4 weeks.  Sciatica Sciatica is a name for lower back pain caused by pressure on the sciatic nerve. The back pain can spread to the buttocks and back of the leg. HOME CARE   Rest as much as possible.   Only take medicine as told by your doctor.   Apply cold or heat to your back as told by your doctor.   Do not bend, lift, or sit for a long time until your pain is better.   Do not do anything that makes the condition worse.   Start normal activity when the pain is better.   Keep all follow-up visits.  GET HELP RIGHT AWAY IF:   There is more pain.   There is weakness or numbness in the legs.   You cannot control when you poop (bowel movement) or pee (urinate).  MAKE SURE YOU:   Understand these instructions.   Will watch this condition.   Will get help right away if you are not doing well or get worse.  Document Released: 08/12/2008 Document Revised: 10/23/2011 Document Reviewed: 08/12/2008 [facility] Patient Information 2012 [place], [place].

## 2012-06-21 ENCOUNTER — Ambulatory Visit: Payer: Medicaid Other | Attending: Family Medicine | Admitting: Physical Therapy

## 2012-06-21 DIAGNOSIS — M545 Low back pain, unspecified: Secondary | ICD-10-CM | POA: Insufficient documentation

## 2012-06-21 DIAGNOSIS — R262 Difficulty in walking, not elsewhere classified: Secondary | ICD-10-CM | POA: Insufficient documentation

## 2012-06-21 DIAGNOSIS — M25559 Pain in unspecified hip: Secondary | ICD-10-CM | POA: Insufficient documentation

## 2012-06-21 DIAGNOSIS — IMO0001 Reserved for inherently not codable concepts without codable children: Secondary | ICD-10-CM | POA: Insufficient documentation

## 2012-06-24 ENCOUNTER — Ambulatory Visit: Payer: Medicaid Other | Admitting: Family Medicine

## 2012-06-28 ENCOUNTER — Encounter: Payer: Medicaid Other | Admitting: Physical Therapy

## 2012-07-01 ENCOUNTER — Ambulatory Visit: Payer: Medicaid Other | Admitting: Physical Therapy

## 2012-07-06 ENCOUNTER — Ambulatory Visit: Payer: Medicaid Other

## 2012-07-13 ENCOUNTER — Ambulatory Visit: Payer: Medicaid Other | Admitting: Physical Therapy

## 2012-11-28 ENCOUNTER — Emergency Department (HOSPITAL_COMMUNITY): Payer: Medicaid Other

## 2012-11-28 ENCOUNTER — Encounter (HOSPITAL_COMMUNITY): Payer: Self-pay | Admitting: Emergency Medicine

## 2012-11-28 ENCOUNTER — Emergency Department (HOSPITAL_COMMUNITY)
Admission: EM | Admit: 2012-11-28 | Discharge: 2012-11-28 | Disposition: A | Discharge status: Home / Self Care | Payer: Medicaid Other | Attending: Emergency Medicine | Admitting: Emergency Medicine

## 2012-11-28 DIAGNOSIS — M545 Low back pain, unspecified: Secondary | ICD-10-CM | POA: Insufficient documentation

## 2012-11-28 DIAGNOSIS — F1420 Cocaine dependence, uncomplicated: Secondary | ICD-10-CM | POA: Insufficient documentation

## 2012-11-28 DIAGNOSIS — R109 Unspecified abdominal pain: Secondary | ICD-10-CM | POA: Insufficient documentation

## 2012-11-28 DIAGNOSIS — F4310 Post-traumatic stress disorder, unspecified: Secondary | ICD-10-CM | POA: Insufficient documentation

## 2012-11-28 DIAGNOSIS — Z951 Presence of aortocoronary bypass graft: Secondary | ICD-10-CM | POA: Insufficient documentation

## 2012-11-28 DIAGNOSIS — I10 Essential (primary) hypertension: Secondary | ICD-10-CM | POA: Insufficient documentation

## 2012-11-28 DIAGNOSIS — Z8669 Personal history of other diseases of the nervous system and sense organs: Secondary | ICD-10-CM | POA: Insufficient documentation

## 2012-11-28 DIAGNOSIS — G8929 Other chronic pain: Secondary | ICD-10-CM | POA: Insufficient documentation

## 2012-11-28 DIAGNOSIS — J45909 Unspecified asthma, uncomplicated: Secondary | ICD-10-CM | POA: Insufficient documentation

## 2012-11-28 DIAGNOSIS — Z8673 Personal history of transient ischemic attack (TIA), and cerebral infarction without residual deficits: Secondary | ICD-10-CM | POA: Insufficient documentation

## 2012-11-28 DIAGNOSIS — J4489 Other specified chronic obstructive pulmonary disease: Secondary | ICD-10-CM | POA: Insufficient documentation

## 2012-11-28 DIAGNOSIS — Z7901 Long term (current) use of anticoagulants: Secondary | ICD-10-CM | POA: Insufficient documentation

## 2012-11-28 DIAGNOSIS — I252 Old myocardial infarction: Secondary | ICD-10-CM | POA: Insufficient documentation

## 2012-11-28 DIAGNOSIS — Z8719 Personal history of other diseases of the digestive system: Secondary | ICD-10-CM | POA: Insufficient documentation

## 2012-11-28 DIAGNOSIS — J449 Chronic obstructive pulmonary disease, unspecified: Secondary | ICD-10-CM | POA: Insufficient documentation

## 2012-11-28 DIAGNOSIS — Z79899 Other long term (current) drug therapy: Secondary | ICD-10-CM | POA: Insufficient documentation

## 2012-11-28 DIAGNOSIS — Z7982 Long term (current) use of aspirin: Secondary | ICD-10-CM | POA: Insufficient documentation

## 2012-11-28 DIAGNOSIS — I2510 Atherosclerotic heart disease of native coronary artery without angina pectoris: Secondary | ICD-10-CM | POA: Insufficient documentation

## 2012-11-28 HISTORY — DX: Other chronic pain: G89.29

## 2012-11-28 HISTORY — DX: Sciatica, unspecified side: M54.30

## 2012-11-28 HISTORY — DX: Dorsalgia, unspecified: M54.9

## 2012-11-28 LAB — COMPREHENSIVE METABOLIC PANEL
ALT: 20 U/L (ref 0–35)
AST: 29 U/L (ref 0–37)
Albumin: 4 g/dL (ref 3.5–5.2)
Alkaline Phosphatase: 52 U/L (ref 39–117)
Calcium: 9.8 mg/dL (ref 8.4–10.5)
Potassium: 3.9 mEq/L (ref 3.5–5.1)
Sodium: 140 mEq/L (ref 135–145)
Total Protein: 7.6 g/dL (ref 6.0–8.3)

## 2012-11-28 LAB — URINALYSIS, ROUTINE W REFLEX MICROSCOPIC
Bilirubin Urine: NEGATIVE
Glucose, UA: NEGATIVE mg/dL
Ketones, ur: NEGATIVE mg/dL
Leukocytes, UA: NEGATIVE
Protein, ur: NEGATIVE mg/dL
pH: 6 (ref 5.0–8.0)

## 2012-11-28 LAB — CBC WITH DIFFERENTIAL/PLATELET
Basophils Absolute: 0 10*3/uL (ref 0.0–0.1)
Eosinophils Absolute: 0.1 10*3/uL (ref 0.0–0.7)
Eosinophils Relative: 1 % (ref 0–5)
HCT: 38.5 % (ref 36.0–46.0)
Lymphocytes Relative: 46 % (ref 12–46)
MCH: 29.8 pg (ref 26.0–34.0)
MCV: 86.1 fL (ref 78.0–100.0)
Monocytes Absolute: 0.6 10*3/uL (ref 0.1–1.0)
Platelets: 231 10*3/uL (ref 150–400)
RDW: 12.9 % (ref 11.5–15.5)

## 2012-11-28 MED ORDER — HYDROCODONE-ACETAMINOPHEN 5-325 MG PO TABS: ORAL_TABLET | ORAL | Status: DC

## 2012-11-28 MED ORDER — METHOCARBAMOL 500 MG PO TABS: 1000.0000 mg | ORAL_TABLET | Freq: Four times a day (QID) | ORAL | Status: DC | PRN

## 2012-11-28 MED ORDER — SODIUM CHLORIDE 0.9 % IV SOLN
INTRAVENOUS | Status: DC
  Administered 2012-11-28: 14:00:00 via INTRAVENOUS

## 2012-11-28 MED ORDER — MORPHINE SULFATE 4 MG/ML IJ SOLN
4.0000 mg | INTRAMUSCULAR | Status: DC | PRN
  Administered 2012-11-28: 4 mg via INTRAVENOUS
  Filled 2012-11-28: qty 1

## 2012-11-28 MED ORDER — ONDANSETRON HCL 4 MG/2ML IJ SOLN
4.0000 mg | INTRAMUSCULAR | Status: DC | PRN
  Administered 2012-11-28: 4 mg via INTRAVENOUS
  Filled 2012-11-28: qty 2

## 2012-11-28 NOTE — ED Notes (Signed)
Pt c/o right anterior flank pain x2 weeks that has moved to posterior right side. Pt denies any dysuria, and sts she drinks lots of water.

## 2012-11-28 NOTE — ED Provider Notes (Signed)
History     CSN: [id number]  Arrival date & time 11/28/12  1127   First MD Initiated Contact with Patient 11/28/12 1310      Chief Complaint  Patient presents with  . Flank Pain     HPI Pt was seen at 1355.   Per pt, c/o gradual onset and persistence of constant right sided low back/flank "pain" for the past 2 weeks.  Describes the pain as "sore."  Pain worsens with palpation of the area and movement of her torso.  Denies dysuria/hematuria, no CP/palpitations, no SOB/cough, no abd pain, no N/V/D, no fevers, no rash.    Past Medical History  Diagnosis Date  . Adjustment disorder with depressed mood 01/11/2009  . CAD 07/10/2008  . COCAINE DEPENDENCY NOS 01/14/2007  . CONVULSIONS, SEIZURES, NOS 01/14/2007  . CVA 01/14/2007  . HEPATITIS C 02/26/2009  . HYPERTENSION, BENIGN SYSTEMIC 01/14/2007  . TOBACCO DEPENDENCE 01/14/2007  . MI (myocardial infarction) 12/20/2007    had stent placed  . Stroke   . Coronary artery disease 01/14/2012  . Asthma   . COPD (chronic obstructive pulmonary disease)   . PTSD (post-traumatic stress disorder)   . Chronic back pain   . Sciatica     Past Surgical History  Procedure Date  . Coronary angioplasty with stent placement   . Cesarean section     x 2    Family History  Problem Relation Age of Onset  . Stroke Mother   . Liver cancer Father     ??? with alcohol abuse  . Colon cancer Paternal Uncle   . Heart disease Sister   . Colon cancer Paternal Aunt     History  Substance Use Topics  . Smoking status: Current Every Day Smoker -- 0.5 packs/day  . Smokeless tobacco: Never Used  . Alcohol Use: No     Comment: quit 4 years ago    Review of Systems ROS: Statement: All systems negative except as marked or noted in the HPI; Constitutional: Negative for fever and chills. ; ; Eyes: Negative for eye pain, redness and discharge. ; ; ENMT: Negative for ear pain, hoarseness, nasal congestion, sinus pressure and sore throat. ; ; Cardiovascular:  Negative for chest pain, palpitations, diaphoresis, dyspnea and peripheral edema. ; ; Respiratory: Negative for cough, wheezing and stridor. ; ; Gastrointestinal: Negative for nausea, vomiting, diarrhea, abdominal pain, blood in stool, hematemesis, jaundice and rectal bleeding. . ; ; Genitourinary: +flank pain. Negative for dysuria and hematuria. ; ; Musculoskeletal: +back pain. Negative for neck pain. Negative for swelling and trauma.; ; Skin: Negative for pruritus, rash, abrasions, blisters, bruising and skin lesion.; ; Neuro: Negative for headache, lightheadedness and neck stiffness. Negative for weakness, altered level of consciousness , altered mental status, extremity weakness, paresthesias, involuntary movement, seizure and syncope.      Allergies  Review of patient's allergies indicates no known allergies.  Home Medications   Current Outpatient Rx  Name  Route  Sig  Dispense  Refill  . AMLODIPINE BESYLATE 5 MG PO TABS   Oral   Take 5 mg by mouth daily.         . ASPIRIN 81 MG PO TBEC   Oral   Take 81 mg by mouth daily.           . ATORVASTATIN CALCIUM 40 MG PO TABS   Oral   Take 40 mg by mouth daily.         [name] CARBAMAZEPINE 200 MG PO TABS  Oral   Take 200 mg by mouth 2 (two) times daily.           [name] CARVEDILOL 12.5 MG PO TABS   Oral   Take 12.5 mg by mouth 2 (two) times daily with a meal.         . CLOPIDOGREL BISULFATE 75 MG PO TABS   Oral   Take 75 mg by mouth daily.           . CYCLOBENZAPRINE HCL 10 MG PO TABS   Oral   Take 5 mg by mouth 3 (three) times daily as needed. As needed for pain.         [name] NITROGLYCERIN 0.4 MG SL SUBL   Sublingual   Place 0.4 mg under the tongue every 5 (five) minutes as needed.           BP 170/75  Pulse 60  Temp 98.2 F (36.8 C) (Oral)  Resp 18  SpO2 100%  Physical Exam 1400: Physical examination:  Nursing notes reviewed; Vital signs and O2 SAT reviewed;  Constitutional: Well developed, Well nourished,  Well hydrated, In no acute distress; Head:  Normocephalic, atraumatic; Eyes: EOMI, PERRL, No scleral icterus; ENMT: Mouth and pharynx normal, Mucous membranes moist; Neck: Supple, Full range of motion, No lymphadenopathy; Cardiovascular: Regular rate and rhythm, No murmur, rub, or gallop; Respiratory: Breath sounds clear & equal bilaterally, No rales, rhonchi, wheezes.  Speaking full sentences with ease, Normal respiratory effort/excursion; Chest: Nontender, No rash. Movement normal; Abdomen: Soft, Nontender, Nondistended, Normal bowel sounds; Genitourinary: No CVA tenderness; Spine:  No midline CS, TS, LS tenderness.  +TTP right lumbar paraspinal muscles. No rash.;; Extremities: Pulses normal, No tenderness, No edema, No calf edema or asymmetry.; Neuro: AA&Ox3, Major CN grossly intact.  Speech clear. Climbs on and off stretcher by herself with ease. Gait steady. No gross focal motor or sensory deficits in extremities.; Skin: Color normal, Warm, Dry.   ED Course  Procedures     MDM  MDM Reviewed: nursing note, vitals and previous chart Interpretation: labs and x-ray   Results for orders placed during the hospital encounter of 11/28/12  CBC WITH DIFFERENTIAL      Component Value Range   WBC 7.5  4.0 - 10.5 K/uL   RBC 4.47  3.87 - 5.11 MIL/uL   Hemoglobin 13.3  12.0 - 15.0 g/dL   HCT 16.1  09.6 - 04.5 %   MCV 86.1  78.0 - 100.0 fL   MCH 29.8  26.0 - 34.0 pg   MCHC 34.5  30.0 - 36.0 g/dL   RDW 40.9  81.1 - 91.4 %   Platelets 231  150 - 400 K/uL   Neutrophils Relative 44  43 - 77 %   Neutro Abs 3.3  1.7 - 7.7 K/uL   Lymphocytes Relative 46  12 - 46 %   Lymphs Abs 3.4  0.7 - 4.0 K/uL   Monocytes Relative 8  3 - 12 %   Monocytes Absolute 0.6  0.1 - 1.0 K/uL   Eosinophils Relative 1  0 - 5 %   Eosinophils Absolute 0.1  0.0 - 0.7 K/uL   Basophils Relative 0  0 - 1 %   Basophils Absolute 0.0  0.0 - 0.1 K/uL  URINALYSIS, ROUTINE W REFLEX MICROSCOPIC      Component Value Range   Color,  Urine YELLOW  YELLOW   APPearance CLEAR  CLEAR   Specific Gravity, Urine 1.009  1.005 - 1.030  pH 6.0  5.0 - 8.0   Glucose, UA NEGATIVE  NEGATIVE mg/dL   Hgb urine dipstick NEGATIVE  NEGATIVE   Bilirubin Urine NEGATIVE  NEGATIVE   Ketones, ur NEGATIVE  NEGATIVE mg/dL   Protein, ur NEGATIVE  NEGATIVE mg/dL   Urobilinogen, UA 0.2  0.0 - 1.0 mg/dL   Nitrite NEGATIVE  NEGATIVE   Leukocytes, UA NEGATIVE  NEGATIVE  COMPREHENSIVE METABOLIC PANEL      Component Value Range   Sodium 140  135 - 145 mEq/L   Potassium 3.9  3.5 - 5.1 mEq/L   Chloride 103  96 - 112 mEq/L   CO2 28  19 - 32 mEq/L   Glucose, Bld 79  70 - 99 mg/dL   BUN 9  6 - 23 mg/dL   Creatinine, Ser 1.19  0.50 - 1.10 mg/dL   Calcium 9.8  8.4 - 14.7 mg/dL   Total Protein 7.6  6.0 - 8.3 g/dL   Albumin 4.0  3.5 - 5.2 g/dL   AST 29  0 - 37 U/L   ALT 20  0 - 35 U/L   Alkaline Phosphatase 52  39 - 117 U/L   Total Bilirubin 0.4  0.3 - 1.2 mg/dL   GFR calc non Af Amer 80 (*) >90 mL/min   GFR calc Af Amer >90  >90 mL/min  LIPASE, BLOOD      Component Value Range   Lipase 50  11 - 59 U/L  D-DIMER, QUANTITATIVE      Component Value Range   D-Dimer, Quant <0.27  0.00 - 0.48 ug/mL-FEU   Dg Chest 2 View 11/28/2012  *RADIOLOGY REPORT*  Clinical Data: Cough, CHF, weakness, shortness of breath  CHEST - 2 VIEW  Comparison: 06/04/2008  Findings: Stable heart size.  Vascular and interstitial prominence, slightly more pronounced than the prior study, developing early edema not excluded.  Basilar atelectasis evident.  No definite focal pneumonia, collapse, consolidation, effusion or pneumothorax. Azygos fissure again noted, normal variant.  Trachea is midline.  IMPRESSION: Increased vascular and interstitial prominence, suspect early edema pattern.  Basilar atelectasis.   Original Report Authenticated By: Judie Petit. Miles Costain, M.D.      [phone]:  Improved after pain meds.  Wants to go home now.  Appears msk pain at this time; will tx symptomatically.  Doubt  AAA or acute intra-abd pathology with previous CT scan in 2009 negative for AAA and abd benign on initial and re-exams.  Doubt PE with negative d-dimer, no tachycardia or hypoxia during ED stay.  Dx and testing d/w pt.  Questions answered.  Verb understanding, agreeable to d/c home with outpt f/u.           Laray Anger, DO 11/30/12 2242

## 2012-11-28 NOTE — ED Notes (Signed)
Pt. Stated, It starts in my rt. Side and goes to my back and hurts to move.

## 2012-11-28 NOTE — ED Notes (Signed)
Pt verbalized understanding of importance to follow up as well as pain management, IV removed and pt getting dressed.

## 2012-11-29 LAB — URINE CULTURE: Colony Count: 40000

## 2012-12-02 ENCOUNTER — Encounter: Payer: Self-pay | Admitting: Family Medicine

## 2012-12-02 ENCOUNTER — Ambulatory Visit (INDEPENDENT_AMBULATORY_CARE_PROVIDER_SITE_OTHER): Payer: Medicaid Other | Admitting: Family Medicine

## 2012-12-02 VITALS — BP 141/80 | HR 83 | Temp 99.0°F | Ht 64.5 in | Wt 171.0 lb

## 2012-12-02 DIAGNOSIS — J069 Acute upper respiratory infection, unspecified: Secondary | ICD-10-CM

## 2012-12-02 DIAGNOSIS — R109 Unspecified abdominal pain: Secondary | ICD-10-CM | POA: Insufficient documentation

## 2012-12-02 MED ORDER — FLUTICASONE PROPIONATE 50 MCG/ACT NA SUSP: 2.0000 | Freq: Every day | NASAL | Status: DC

## 2012-12-02 MED ORDER — BENZONATATE 100 MG PO CAPS: 100.0000 mg | ORAL_CAPSULE | Freq: Two times a day (BID) | ORAL | Status: DC | PRN

## 2012-12-02 NOTE — Assessment & Plan Note (Signed)
Afebrile in clinic today, normal oxygen saturation.  Symptoms likely due to viral URI.  Encouraged rest and fluids.  Tessalon perles and Flonase for symptom relief.  Red flag reviewed and follow up PRN.

## 2012-12-02 NOTE — Progress Notes (Signed)
  Subjective:    Patient ID: [name], female    DOB: 1956-02-02, 57 y.o.   MRN: [id number]  HPI  Patient here for same day for ED follow up.  She was seen on 1/14 for back and flank pain that started about one week ago.  UA and labs were unremarkable and patient sent home with Vicodin and Robaxin.  Vicodin seems to be helping.  She is taking both medications about three times per day.  Currently denies any low back pain, but still has flank pain.  Denies any dysuria, gross hematuria, vaginal discharge, or vaginal bleeding.    She does endorse chest congestion and productive cough.  + rhinorrhea.  This has been going on 4-5 days.  Coughing seems to worsen RT side pain.  She has not tried any OTC medications for cough.  Endorses decreased appetite.  Denies fevers, nausea, or vomiting.     Review of Systems  Per HPI    Objective:   Physical Exam  Constitutional: She appears well-nourished. No distress.  HENT:       MM dry  Cardiovascular: Normal rate and normal heart sounds.   No murmur heard. Pulmonary/Chest: Effort normal and breath sounds normal. She has no wheezes. She has no rales.  Abdominal: Soft. Bowel sounds are normal. She exhibits no distension. There is no tenderness. There is no rebound.      Assessment & Plan:

## 2012-12-02 NOTE — Patient Instructions (Signed)
It was good to see you today, [name].  I am sorry you are not feeling well. Please pick up prescriptions at your pharmacy and use as directed. For side pain, continue taking the medications from ER until pain resolves. Please refer to instructions below regarding the common cold. If you develop worsening symptoms, fever (temp > 101 degrees), persistent cough, or nausea/vomiting, please return to clinic. Hope you feel better soon.  Upper Respiratory Infection, Adult  An upper respiratory infection (URI) is also known as the common cold. It is often caused by a type of germ (virus). Colds are easily spread (contagious). You can pass it to others by kissing, coughing, sneezing, or drinking out of the same glass. Usually, you get better in 1 or 2 weeks.  HOME CARE  Only take medicine as told by your doctor.  Use a warm mist humidifier or breathe in steam from a hot shower.  Drink enough water and fluids to keep your pee (urine) clear or pale yellow.  Get plenty of rest.  Return to work when your temperature is back to normal or as told by your doctor. You may use a face mask and wash your hands to stop your cold from spreading. GET HELP RIGHT AWAY IF:  After the first few days, you feel you are getting worse.  You have questions about your medicine.  You have chills, shortness of breath, or brown or red spit (mucus).  You have yellow or brown snot (nasal discharge) or pain in the face, especially when you bend forward.  You have a fever, puffy (swollen) neck, pain when you swallow, or white spots in the back of your throat.  You have a bad headache, ear pain, sinus pain, or chest pain.  You have a high-pitched whistling sound when you breathe in and out (wheezing).  You have a lasting cough or cough up blood.  You have sore muscles or a stiff neck. MAKE SURE YOU:  Understand these instructions.  Will watch your condition.  Will get help right away if you are not doing well or get  worse. Document Released: 04/21/2008 Document Revised: 01/26/2012 Document Reviewed: 03/10/2011  [facility] Patient Information 2013 [place], [place].

## 2012-12-02 NOTE — Assessment & Plan Note (Signed)
RT flank pain not likely due to kidney stones (no gross hematuria) or PID (no hx of GU symptoms or systemic symptoms).  Likely MSK in etiology, pain relieved by Vicodin from ER.  Will continue to treat symptoms with Vicodin and Robaxin.  Will also treat URI symptoms which may relieve cough and possible referred pain.  Follow up as needed if symptoms worsen.

## 2012-12-16 ENCOUNTER — Telehealth: Payer: Self-pay | Admitting: Family Medicine

## 2012-12-16 NOTE — Telephone Encounter (Signed)
Brought in paperwork at her last OV - wants to know if this has been filled out and taken care of.  pls advise

## 2012-12-16 NOTE — Telephone Encounter (Signed)
Spoke with patient and she stated that the forms were given at the visit and she was told that when they are done being filled out she would receive a phone call to pick them up. Forms are so that she can get an aid

## 2012-12-16 NOTE — Telephone Encounter (Signed)
Completed physician section of form.  Patient to fill out Section A.  She can pick up form from the front desk anytime.  Thanks.

## 2012-12-16 NOTE — Telephone Encounter (Signed)
I do not have anything in my box regarding patient Danilynn Jemison.  I may have filled it out already.  What was it for?

## 2013-06-20 ENCOUNTER — Encounter (HOSPITAL_COMMUNITY): Payer: Self-pay | Admitting: Pharmacy Technician

## 2013-06-24 ENCOUNTER — Telehealth: Payer: Self-pay | Admitting: *Deleted

## 2013-06-24 ENCOUNTER — Other Ambulatory Visit: Payer: Self-pay | Admitting: Ophthalmology

## 2013-06-24 MED ORDER — TETRACAINE HCL 0.5 % OP SOLN: 1.0000 [drp] | OPHTHALMIC | Status: DC

## 2013-06-24 NOTE — Telephone Encounter (Signed)
Patient is scheduled for pre-surgery work-up and eye surgery next week.  Due to patient having Medicaid, office calling to request NPI #  to authorize appts.  NPI # given.  Gaylene Brooks, RN

## 2013-06-24 NOTE — H&P (Signed)
History & Physical:   DATE:   06-01-13  NAME:  [name], [name]     [id number]       HISTORY OF PRESENT ILLNESS: Chief Eye Complaints glaucoma  Having trouble seeing at Dist.& Near OD runs water all he time,not using any art-tears any for dry eyes. NO refills needed on glaucoma gtt.         HPI: EYES: Reports symptoms of     LOCATION:   BOTH EYES        QUALITY/COURSE:   Reports condition is worsening.        INTENSITY/SEVERITY:    Reports measurement ( or degree) as severe .      DURATION:   Reports the general length of symptoms to be months.      ONSET/TIMING:   Reports occurrence as continuous.      CONTEXT/WHEN:   Reports usually associated with   MODIFIERS/TREATMENTS:  Improved by              ROS:   GEN- Constitutional: HENT: GEN - Endocrine: Reports symptoms of LUNGS/Respiratory:  HEART/Cardiovascular: Reports symptoms of hypertension.    ABD/Gastrointestinal:   Musculoskeletal (BJE): NEURO/Neurological: PTSD, stroke PSYCH/Psychiatric:    Is the pt oriented to time, place, person? yes Mood depressed  ACTIVE PROBLEMS: Glaucoma, chronic angle-closure   ICD#365.23     mixed mechanism stable uncontrolled without simbrinza OU  patient  can not get simbrinza   Hypertension, benign ICD#401.1   Hypertensive retinopathy   ICD#362.11   Nuclear cataract NOS   ICD#366.04   Asthma - intrinsic, stable   ICD#493.10   PTSD STROKE 2005 MI 2006  SURGERIES: PERIPHERAL  IRIDECTOMY   OD 02/04/2013  PERIPHERAL  IRIDECTOMY   OU 11/30/2012 Pick List - Surgeries  C-Section x2  MEDICATIONS: Aspirin:  81 mg tablet  SIG-  1 each   once a day    Flexeril (Cyclobenzaprine):   10 mg tablet  SIG-  1 each   3 times a day   Carbamazepine (Tegretol): 200 mg tablet  SIG-  1 each   2 times a day   Plavix (Clopidogrel):        75 mg tablet  SIG-  1 each   once a day     Lipitor (Atorvastatin):   40 mg tablet  SIG-  1 tab(s)   once a day (at bedtime)           Amlodipine  (Norvasc) Besylate:  5 mg-20 mg capsule  SIG-   1 cap(s)   once a day           Comment-     Comment-       Coreg (Carvedilol):   12.5 mg tablet  SIG-  1 tab(s)   2 times a day    Alphagan P: 0.15% solution SIG-  Dose-  Freq-   1 drop bothe eyes three times a day    Trusopt: 2% solution SIG-  Dose-  Freq-   1 drop both eyes three times a day    Travatan Z: 0.004% solution SIG-  Dose-  Freq-   1 drop both eyes at bedtime   TOBACCO: Never smoker   ICD#V13.89   No exposure to tobacco.  SOCIAL HISTORY: Starter Pick List - Social History  Single Disable  FAMILY HISTORY: Positive family history for  -   Glaucoma: Glaucoma  /  HTN Negative family history for  -   PARENTS: Glaucoma:;   Hypertension:   Father Glaucoma   Mother HTN CHILDREN: GRANDPARENTS: SIBLINGS:  Sister HTN UNCLES/AUNTS:   2-3 aunts Glaucoma OTHERS/DISTANT:    Family History - 1st Degree Relatives:  Mother dead.   Family History - 1st Degree Relatives:   Family History - 1st Degree Relatives:  Father is dead.  ALLERGIES: no beta blockers Drug Allergies.  No Known.  PHYSICAL EXAMINATION: [facility]    04/15/2013 09:47  OD:CC: 20/30-  ph NI OS:CC: 20/30+  ph NI  EYEGLASSES: NONE (Rx. Glasses) OD: Wear OTC Readers                                              OS:+2.00 OU ADD:  MR:   OD:+1.00 + 0.50 x 108   20/25- OS  +1.25 sph   20/30- ADD:  +2.25  VF:   OD:  Motility: Full OU  PUPILS: 4mm OU not detected MG   EYELIDS & OCULAR ADNEXA:  SLE: Conjunctiva: 1-2 injection   Cornea:  Arcus  K-spindle OU   Anterior Chamber:+1 pig cell OU  Iris:  PERIPHERAL  IRIDECTOMY open   OU  Lens:1 -2 NS OU  Vitreous:  CCT:  Ta   in mmHg:                       OD:21                                        OS: 20 Time05/30/2014 10:30    Gonio:  Sup and Inf. TM nasal OD    Seems open to TM w/squeezing OS    Dilation:  Not Dilated secondary to narrow angles  Fundus:  optic nerve:   OD:      pallor  85-90% cup                                             OS:      90% cup inf. rim loss   Macula:       OD:                                                     OS:   Vessels:  Silver wire   OU  Periphery:   Visual Fields  severe constriction OU  ADMITTING DIAGNOSIS: Glaucoma, chronic angle-closure   ICD#365.23     mixed mechanism  uncontrolled without simbrinza OU  patient  can not get simbrinza    patient  at high likely hood of total vision loss.    Risk and benefits of surgery have been reviewed with the patient and the patient agrees to proceed with the surgical procedure.   Hypertension, benign ICD#401.1   Hypertensive retinopathy   ICD#362.11   Nuclear cataract NOS   ICD#366.04   Asthma - intrinsic, stable   ICD#493.10  SURGICAL TREATMENT PLAN: trabeculectomy  with [facility] OS first then OD  Risk and benefits of surgery have been reviewed with the patient and the patient agrees to proceed with the surgical procedure.      Actions:     Handouts: Calcium Channel Blockers, Hypertension, Lopid, Tegretol, Cyclobenzaprine.     ___________________________ Chalmers Guest, Jr. Starter - Inactive Problems:    Post Dramatic Syndrome    Stroke 2008

## 2013-06-25 NOTE — Pre-Procedure Instructions (Signed)
[name]  06/25/2013   Your procedure is scheduled on:  August 13  Report to [facility] at 08:15 AM.  Call this number if you have problems the morning of surgery: [phone]   Remember:   Do not eat food or drink liquids after midnight.   Take these medicines the morning of surgery with A SIP OF WATER: Albuterol (if needed), Amlodipine, Alphagan, Tegretol, Carvedilol, Trusopt,    STOP Meloxicam  today   Do not wear jewelry, make-up or nail polish.  Do not wear lotions, powders, or perfumes. You may wear deodorant.  Do not shave 48 hours prior to surgery. Men may shave face and neck.  Do not bring valuables to the hospital.  [facility] is not responsible for any belongings or valuables.  Contacts, dentures or bridgework may not be worn into surgery.  Leave suitcase in the car. After surgery it may be brought to your room.  For patients admitted to the hospital, checkout time is 11:00 AM the day of discharge.   Patients discharged the day of surgery will not be allowed to drive home.  Name and phone number of your driver: Family/ Friend  Special Instructions: Shower using CHG 2 nights before surgery and the night before surgery.  If you shower the day of surgery use CHG.  Use special wash - you have one bottle of CHG for all showers.  You should use approximately 1/3 of the bottle for each shower.   Please read over the following fact sheets that you were given: Pain Booklet, Coughing and Deep Breathing and Surgical Site Infection Prevention

## 2013-06-27 ENCOUNTER — Encounter (HOSPITAL_COMMUNITY)
Admission: RE | Admit: 2013-06-27 | Discharge: 2013-06-27 | Disposition: A | Discharge status: Home / Self Care | Payer: Medicaid Other | Source: Ambulatory Visit | Attending: Ophthalmology | Admitting: Ophthalmology

## 2013-06-27 ENCOUNTER — Encounter (HOSPITAL_COMMUNITY): Payer: Self-pay

## 2013-06-27 ENCOUNTER — Encounter (HOSPITAL_COMMUNITY)
Admission: RE | Admit: 2013-06-27 | Discharge: 2013-06-27 | Disposition: A | Discharge status: Home / Self Care | Payer: Medicaid Other | Source: Ambulatory Visit | Attending: Anesthesiology | Admitting: Anesthesiology

## 2013-06-27 HISTORY — DX: Hyperlipidemia, unspecified: E78.5

## 2013-06-27 HISTORY — DX: Unspecified osteoarthritis, unspecified site: M19.90

## 2013-06-27 HISTORY — DX: Other amnesia: R41.3

## 2013-06-27 HISTORY — DX: Pneumonia, unspecified organism: J18.9

## 2013-06-27 HISTORY — DX: Unspecified glaucoma: H40.9

## 2013-06-27 HISTORY — DX: Weakness: R53.1

## 2013-06-27 HISTORY — DX: Family history of other specified conditions: Z84.89

## 2013-06-27 LAB — COMPREHENSIVE METABOLIC PANEL
Albumin: 4.1 g/dL (ref 3.5–5.2)
BUN: 9 mg/dL (ref 6–23)
Calcium: 10 mg/dL (ref 8.4–10.5)
Creatinine, Ser: 0.83 mg/dL (ref 0.50–1.10)
Total Bilirubin: 0.4 mg/dL (ref 0.3–1.2)
Total Protein: 7.7 g/dL (ref 6.0–8.3)

## 2013-06-27 LAB — CBC
HCT: 38 % (ref 36.0–46.0)
Hemoglobin: 13.9 g/dL (ref 12.0–15.0)
MCV: 86 fL (ref 78.0–100.0)
Platelets: 287 10*3/uL (ref 150–400)
RBC: 4.42 MIL/uL (ref 3.87–5.11)
WBC: 9 10*3/uL (ref 4.0–10.5)

## 2013-06-27 NOTE — Progress Notes (Signed)
Nurse called Dr. Hosie Poisson office and informed "[name]" of patient not instructed if she needed to continue or stop plavix and aspirin. [name] stated she would call Dr. Annitta Jersey office and notify me directly of findings.

## 2013-06-27 NOTE — Progress Notes (Signed)
Patient informed Nurse that she saw Cardiologist Dr. Sharyn Lull last month. Will request LOV, stress test, Echo, and EKG.

## 2013-06-28 NOTE — Progress Notes (Signed)
Anesthesia Chart Review:  Patient is a 57 year old female scheduled for trabeculectomy with mitomycin, left eye on 06/29/13 by Dr. Harlon Flor.  History includes CAD/MI s/p RCA stent '09, HTN, PTSD, adjustment disorder with depression, former smoker, COPD, seizure disorder (on Tegretol), CVA with right hemiparesis '08, HLD, asthma, chronic back pain, Hepatitis C, tonsillectomy, prior cocaine use (last use six years ago), quit ETOH 4 years ago.  Cardiologist is Dr. Sharyn Lull, with reported last visit ~ one month ago.  Records requested but are still pending.  She denied any new CV issues during her PAT interview.  Her PAT RN did ask Dr. Hosie Poisson office to contact patient if he would like her to hold ASA/Plavix preoperatively.  EKG on 06/27/13 showed NSR.  Nuclear stress test on 10/24/11 showed: no evidence of ischemia or infarction, EF 78%.  Cardiac cath on 12/20/07 showed: LV showed good LV systolic function. There was mild inferior wall hypokinesia, EF of 50-55% left main was patent. LAD has 10-15% proximal and mid stenosis. Diagonal one was small, diagonal two has 30- 40% proximal stenosis, this vessel was also small. Left circumflex has 15-20% mid stenosis. OM1 to OM3 were very, very small. OM4 has 30-40% stenosis. RCA has proximal and mid junction 99% stenosis and 30-40% mid stenosis. PDA is very small. PLV branches are patent. She subsequently underwent successful PTCA to proximal and mid junction of RCA was done using 3.0 x 12 mm long Voyager balloon for predilatation and then 3.5 x 23 mm long Promus drug eluting stent was deployed in mid and proximal and mid junction of RCA.  Currently, the last echo seen is from 2008. (See Results Review tab for report.)  Preoperative CXR and labs noted.  Patient has a history of CAD with RCA stent, but reportedly without new CV symptoms.  Her EKG is WNL, and she had a negative stress test within the last two years.  Would anticipate that unless there are acute  changes that she could proceed as planned.  Velna Ochs [facility]/Anesthesiology Phone [phone] 06/28/2013 10:06 AM

## 2013-06-28 NOTE — Progress Notes (Signed)
[name] from Dr. Hosie Poisson office returned call and stated that she was unsuccessful at contacting Dr. Annitta Jersey at this time. Nurse requested that when it was determined IF aspirin and plavix was to be held that she contact patient and let her know. [name] stated she would contact patient. Listed contact number compared with [name] and number in EPIC to ensure the contact number was the same.

## 2013-06-29 ENCOUNTER — Encounter (HOSPITAL_COMMUNITY): Payer: Self-pay | Admitting: Vascular Surgery

## 2013-06-29 ENCOUNTER — Ambulatory Visit (HOSPITAL_COMMUNITY): Payer: Medicaid Other | Admitting: Anesthesiology

## 2013-06-29 ENCOUNTER — Encounter (HOSPITAL_COMMUNITY): Payer: Self-pay | Admitting: *Deleted

## 2013-06-29 ENCOUNTER — Encounter (HOSPITAL_COMMUNITY)
Admission: RE | Disposition: A | Discharge status: Home / Self Care | Payer: Self-pay | Source: Ambulatory Visit | Attending: Ophthalmology

## 2013-06-29 ENCOUNTER — Ambulatory Visit (HOSPITAL_COMMUNITY)
Admission: RE | Admit: 2013-06-29 | Discharge: 2013-06-29 | Disposition: A | Discharge status: Home / Self Care | Payer: Medicaid Other | Source: Ambulatory Visit | Attending: Ophthalmology | Admitting: Ophthalmology

## 2013-06-29 DIAGNOSIS — Z7902 Long term (current) use of antithrombotics/antiplatelets: Secondary | ICD-10-CM | POA: Insufficient documentation

## 2013-06-29 DIAGNOSIS — F4310 Post-traumatic stress disorder, unspecified: Secondary | ICD-10-CM | POA: Insufficient documentation

## 2013-06-29 DIAGNOSIS — Z8673 Personal history of transient ischemic attack (TIA), and cerebral infarction without residual deficits: Secondary | ICD-10-CM | POA: Insufficient documentation

## 2013-06-29 DIAGNOSIS — F3289 Other specified depressive episodes: Secondary | ICD-10-CM | POA: Insufficient documentation

## 2013-06-29 DIAGNOSIS — H409 Unspecified glaucoma: Secondary | ICD-10-CM | POA: Insufficient documentation

## 2013-06-29 DIAGNOSIS — F329 Major depressive disorder, single episode, unspecified: Secondary | ICD-10-CM | POA: Insufficient documentation

## 2013-06-29 DIAGNOSIS — Z7982 Long term (current) use of aspirin: Secondary | ICD-10-CM | POA: Insufficient documentation

## 2013-06-29 DIAGNOSIS — J45909 Unspecified asthma, uncomplicated: Secondary | ICD-10-CM | POA: Insufficient documentation

## 2013-06-29 DIAGNOSIS — H402290 Chronic angle-closure glaucoma, unspecified eye, stage unspecified: Secondary | ICD-10-CM | POA: Insufficient documentation

## 2013-06-29 DIAGNOSIS — I252 Old myocardial infarction: Secondary | ICD-10-CM | POA: Insufficient documentation

## 2013-06-29 DIAGNOSIS — H35039 Hypertensive retinopathy, unspecified eye: Secondary | ICD-10-CM | POA: Insufficient documentation

## 2013-06-29 DIAGNOSIS — I10 Essential (primary) hypertension: Secondary | ICD-10-CM | POA: Insufficient documentation

## 2013-06-29 DIAGNOSIS — H269 Unspecified cataract: Secondary | ICD-10-CM | POA: Insufficient documentation

## 2013-06-29 DIAGNOSIS — Z79899 Other long term (current) drug therapy: Secondary | ICD-10-CM | POA: Insufficient documentation

## 2013-06-29 HISTORY — PX: TRABECULECTOMY: SHX107

## 2013-06-29 SURGERY — TRABECULECTOMY
Anesthesia: Monitor Anesthesia Care | Site: Eye | Laterality: Left | Wound class: Clean

## 2013-06-29 MED ORDER — ATROPINE SULFATE 1 % OP SOLN
OPHTHALMIC | Status: AC
  Filled 2013-06-29: qty 2

## 2013-06-29 MED ORDER — OXYCODONE HCL 5 MG PO TABS
5.0000 mg | ORAL_TABLET | Freq: Once | ORAL | Status: AC | PRN
  Administered 2013-06-29: 5 mg via ORAL

## 2013-06-29 MED ORDER — EPINEPHRINE HCL 1 MG/ML IJ SOLN
INTRAMUSCULAR | Status: AC
  Filled 2013-06-29: qty 1

## 2013-06-29 MED ORDER — SODIUM HYALURONATE 10 MG/ML IO SOLN
INTRAOCULAR | Status: AC
Start: 2013-06-29 — End: 2013-06-29
  Filled 2013-06-29: qty 0.85

## 2013-06-29 MED ORDER — SODIUM HYALURONATE 10 MG/ML IO SOLN
INTRAOCULAR | Status: AC
  Filled 2013-06-29: qty 0.85

## 2013-06-29 MED ORDER — HYDROMORPHONE HCL PF 1 MG/ML IJ SOLN
INTRAMUSCULAR | Status: AC
  Administered 2013-06-29 (×2): 0.5 mg
  Filled 2013-06-29: qty 1

## 2013-06-29 MED ORDER — OXYCODONE HCL 5 MG PO TABS
ORAL_TABLET | ORAL | Status: AC
  Filled 2013-06-29: qty 1

## 2013-06-29 MED ORDER — LIDOCAINE HCL (CARDIAC) 20 MG/ML IV SOLN
INTRAVENOUS | Status: DC | PRN
  Administered 2013-06-29: 20 mg via INTRAVENOUS

## 2013-06-29 MED ORDER — OXYCODONE HCL 5 MG/5ML PO SOLN: 5.0000 mg | Freq: Once | ORAL | Status: AC | PRN

## 2013-06-29 MED ORDER — TETRACAINE HCL 0.5 % OP SOLN
OPHTHALMIC | Status: DC | PRN
  Administered 2013-06-29: 2 [drp] via OPHTHALMIC

## 2013-06-29 MED ORDER — TRIAMCINOLONE ACETONIDE 40 MG/ML IJ SUSP
INTRAMUSCULAR | Status: AC
  Filled 2013-06-29: qty 5

## 2013-06-29 MED ORDER — FLUORESCEIN SODIUM 1 MG OP STRP
ORAL_STRIP | OPHTHALMIC | Status: DC | PRN
  Administered 2013-06-29: 1 via OPHTHALMIC

## 2013-06-29 MED ORDER — ACETYLCHOLINE CHLORIDE 1:100 IO SOLR
INTRAOCULAR | Status: AC
  Filled 2013-06-29: qty 1

## 2013-06-29 MED ORDER — ATROPINE SULFATE 1 % OP OINT
TOPICAL_OINTMENT | OPHTHALMIC | Status: DC | PRN
  Administered 2013-06-29: 1 via OPHTHALMIC

## 2013-06-29 MED ORDER — FENTANYL CITRATE 0.05 MG/ML IJ SOLN
INTRAMUSCULAR | Status: AC
  Filled 2013-06-29: qty 2

## 2013-06-29 MED ORDER — BUPIVACAINE HCL (PF) 0.75 % IJ SOLN
INTRAMUSCULAR | Status: AC
  Filled 2013-06-29: qty 10

## 2013-06-29 MED ORDER — OXYCODONE-ACETAMINOPHEN 5-325 MG PO TABS: 1.0000 | ORAL_TABLET | ORAL | Status: DC | PRN

## 2013-06-29 MED ORDER — TETRACAINE HCL 0.5 % OP SOLN
OPHTHALMIC | Status: AC
  Filled 2013-06-29: qty 2

## 2013-06-29 MED ORDER — TOBRAMYCIN-DEXAMETHASONE 0.3-0.1 % OP OINT
TOPICAL_OINTMENT | OPHTHALMIC | Status: AC
  Filled 2013-06-29: qty 3.5

## 2013-06-29 MED ORDER — BSS IO SOLN
INTRAOCULAR | Status: AC
  Filled 2013-06-29: qty 500

## 2013-06-29 MED ORDER — TOBRAMYCIN 0.3 % OP OINT
TOPICAL_OINTMENT | OPHTHALMIC | Status: DC | PRN
  Administered 2013-06-29: 1 via OPHTHALMIC

## 2013-06-29 MED ORDER — LIDOCAINE-EPINEPHRINE 2 %-1:100000 IJ SOLN
INTRAMUSCULAR | Status: DC | PRN
  Administered 2013-06-29: 11:00:00 via RETROBULBAR

## 2013-06-29 MED ORDER — FENTANYL CITRATE 0.05 MG/ML IJ SOLN
25.0000 ug | INTRAMUSCULAR | Status: DC | PRN
  Administered 2013-06-29: 0.5 ug via INTRAVENOUS
  Administered 2013-06-29 (×2): 50 ug via INTRAVENOUS

## 2013-06-29 MED ORDER — PROPOFOL 10 MG/ML IV BOLUS
INTRAVENOUS | Status: DC | PRN
  Administered 2013-06-29: 60 mg via INTRAVENOUS

## 2013-06-29 MED ORDER — ACETAMINOPHEN 325 MG PO TABS
650.0000 mg | ORAL_TABLET | ORAL | Status: DC | PRN
  Filled 2013-06-29: qty 2

## 2013-06-29 MED ORDER — MITOMYCIN 0.2 MG OP KIT
0.2000 mg | PACK | OPHTHALMIC | Status: DC
  Filled 2013-06-29: qty 1

## 2013-06-29 MED ORDER — GATIFLOXACIN 0.5 % OP SOLN
1.0000 [drp] | OPHTHALMIC | Status: AC
  Administered 2013-06-29 (×3): 1 [drp] via OPHTHALMIC
  Filled 2013-06-29: qty 2.5

## 2013-06-29 MED ORDER — FLUORESCEIN SODIUM 1 MG OP STRP
ORAL_STRIP | OPHTHALMIC | Status: AC
  Filled 2013-06-29: qty 2

## 2013-06-29 MED ORDER — OXYCODONE HCL 5 MG PO TABS
ORAL_TABLET | ORAL | Status: AC
  Administered 2013-06-29: 5 mg via ORAL
  Filled 2013-06-29: qty 1

## 2013-06-29 MED ORDER — MIDAZOLAM HCL 5 MG/5ML IJ SOLN
INTRAMUSCULAR | Status: DC | PRN
  Administered 2013-06-29: 1 mg via INTRAVENOUS

## 2013-06-29 MED ORDER — SODIUM HYALURONATE 10 MG/ML IO SOLN
INTRAOCULAR | Status: DC | PRN
  Administered 2013-06-29: 0.85 mL via INTRAOCULAR

## 2013-06-29 MED ORDER — TRIAMCINOLONE ACETONIDE 40 MG/ML IJ SUSP
INTRAMUSCULAR | Status: DC | PRN
  Administered 2013-06-29: .1 mL

## 2013-06-29 MED ORDER — FENTANYL CITRATE 0.05 MG/ML IJ SOLN
INTRAMUSCULAR | Status: DC | PRN
  Administered 2013-06-29: 50 ug via INTRAVENOUS

## 2013-06-29 MED ORDER — BSS IO SOLN
INTRAOCULAR | Status: DC | PRN
  Administered 2013-06-29: 500 mL via INTRAOCULAR
  Administered 2013-06-29: 15 mL via INTRAOCULAR

## 2013-06-29 MED ORDER — BSS IO SOLN
INTRAOCULAR | Status: AC
  Filled 2013-06-29: qty 15

## 2013-06-29 MED ORDER — ONDANSETRON HCL 4 MG/2ML IJ SOLN: 4.0000 mg | Freq: Four times a day (QID) | INTRAMUSCULAR | Status: DC | PRN

## 2013-06-29 MED ORDER — LIDOCAINE-EPINEPHRINE 2 %-1:100000 IJ SOLN
INTRAMUSCULAR | Status: AC
  Filled 2013-06-29: qty 1

## 2013-06-29 MED ORDER — SODIUM CHLORIDE 0.9 % IV SOLN
INTRAVENOUS | Status: DC
  Administered 2013-06-29: 10:00:00 via INTRAVENOUS

## 2013-06-29 MED ORDER — MITOMYCIN 0.2 MG OP KIT
PACK | OPHTHALMIC | Status: DC | PRN
  Administered 2013-06-29: .4 mg via OPHTHALMIC

## 2013-06-29 MED ORDER — LIDOCAINE HCL 2 % IJ SOLN
INTRAMUSCULAR | Status: AC
  Filled 2013-06-29: qty 20

## 2013-06-29 SURGICAL SUPPLY — 39 items
APPLICATOR COTTON TIP 6IN STRL (MISCELLANEOUS) ×2 IMPLANT
APPLICATOR DR MATTHEWS STRL (MISCELLANEOUS) ×2 IMPLANT
BLADE EYE CATARACT 19 1.4 BEAV (BLADE) ×2 IMPLANT
BLADE STAB KNIFE 45DEG (BLADE) ×2 IMPLANT
CANISTER SUCTION 2500CC (MISCELLANEOUS) ×2 IMPLANT
CLOTH BEACON ORANGE TIMEOUT ST (SAFETY) ×2 IMPLANT
CORDS BIPOLAR (ELECTRODE) ×2 IMPLANT
COVER MAYO STAND STRL (DRAPES) ×2 IMPLANT
DRAPE OPHTHALMIC 40X48 W POUCH (DRAPES) ×2 IMPLANT
DRAPE RETRACTOR (MISCELLANEOUS) ×2 IMPLANT
ERASER HMR WETFIELD 23G BP (MISCELLANEOUS) ×2 IMPLANT
GLOVE BIO SURGEON STRL SZ8 (GLOVE) ×2 IMPLANT
GLOVE BIOGEL PI IND STRL 6.5 (GLOVE) ×1 IMPLANT
GLOVE BIOGEL PI INDICATOR 6.5 (GLOVE) ×1
GLOVE SURG SS PI 6.5 STRL IVOR (GLOVE) ×2 IMPLANT
GOWN STRL NON-REIN LRG LVL3 (GOWN DISPOSABLE) ×6 IMPLANT
KIT BASIN OR (CUSTOM PROCEDURE TRAY) ×2 IMPLANT
KIT ROOM TURNOVER OR (KITS) ×2 IMPLANT
KNIFE GRIESHABER SHARP 2.5MM (MISCELLANEOUS) ×2 IMPLANT
MASK EYE SHIELD (GAUZE/BANDAGES/DRESSINGS) ×2 IMPLANT
NEEDLE 25GX 5/8IN NON SAFETY (NEEDLE) ×2 IMPLANT
NEEDLE HYPO 30X.5 LL (NEEDLE) ×2 IMPLANT
NS IRRIG 1000ML POUR BTL (IV SOLUTION) ×2 IMPLANT
PACK CATARACT CUSTOM (CUSTOM PROCEDURE TRAY) ×2 IMPLANT
PAD ARMBOARD 7.5X6 YLW CONV (MISCELLANEOUS) ×2 IMPLANT
PAD EYE OVAL STERILE LF (GAUZE/BANDAGES/DRESSINGS) ×2 IMPLANT
SPEAR EYE SURG WECK-CEL (MISCELLANEOUS) IMPLANT
SUT ETHILON 10 0 CS140 6 (SUTURE) ×2 IMPLANT
SUT ETHILON 9 0 BV100 4 (SUTURE) ×2 IMPLANT
SUT SILK 6 0 G 6 (SUTURE) ×2 IMPLANT
SUT VICRYL 9-0 (SUTURE) IMPLANT
SYR 50ML SLIP (SYRINGE) ×2 IMPLANT
SYR TB 1ML LUER SLIP (SYRINGE) IMPLANT
TAPE SURG TRANSPORE 1 IN (GAUZE/BANDAGES/DRESSINGS) ×1 IMPLANT
TAPE SURGICAL TRANSPORE 1 IN (GAUZE/BANDAGES/DRESSINGS) ×1
TOWEL OR 17X24 6PK STRL BLUE (TOWEL DISPOSABLE) ×4 IMPLANT
TUBE CONNECTING 12X1/4 (SUCTIONS) ×2 IMPLANT
WATER STERILE IRR 1000ML POUR (IV SOLUTION) ×2 IMPLANT
WIPE INSTRUMENT VISIWIPE 73X73 (MISCELLANEOUS) ×2 IMPLANT

## 2013-06-29 NOTE — H&P (View-Only) (Signed)
                  History & Physical:   DATE:   06-01-13  NAME:  [name], [name]     [id number]       HISTORY OF PRESENT ILLNESS: Chief Eye Complaints glaucoma  Having trouble seeing at Dist.& Near OD runs water all he time,not using any art-tears any for dry eyes. NO refills needed on glaucoma gtt.         HPI: EYES: Reports symptoms of     LOCATION:   BOTH EYES        QUALITY/COURSE:   Reports condition is worsening.        INTENSITY/SEVERITY:    Reports measurement ( or degree) as severe .      DURATION:   Reports the general length of symptoms to be months.      ONSET/TIMING:   Reports occurrence as continuous.      CONTEXT/WHEN:   Reports usually associated with   MODIFIERS/TREATMENTS:  Improved by              ROS:   GEN- Constitutional: HENT: GEN - Endocrine: Reports symptoms of LUNGS/Respiratory:  HEART/Cardiovascular: Reports symptoms of hypertension.    ABD/Gastrointestinal:   Musculoskeletal (BJE): NEURO/Neurological: PTSD, stroke PSYCH/Psychiatric:    Is the pt oriented to time, place, person? yes Mood depressed  ACTIVE PROBLEMS: Glaucoma, chronic angle-closure   ICD#365.23     mixed mechanism stable uncontrolled without simbrinza OU  patient  can not get simbrinza   Hypertension, benign ICD#401.1   Hypertensive retinopathy   ICD#362.11   Nuclear cataract NOS   ICD#366.04   Asthma - intrinsic, stable   ICD#493.10   PTSD STROKE 2005 MI 2006  SURGERIES: PERIPHERAL  IRIDECTOMY   OD 02/04/2013  PERIPHERAL  IRIDECTOMY   OU 11/30/2012 Pick List - Surgeries  C-Section x2  MEDICATIONS: Aspirin:  81 mg tablet  SIG-  1 each   once [name]    Flexeril (Cyclobenzaprine):   10 mg tablet  SIG-  1 each   3 times [name]   Carbamazepine (Tegretol): 200 mg tablet  SIG-  1 each   2 times [name]   Plavix (Clopidogrel):        75 mg tablet  SIG-  1 each   once [name]     Lipitor (Atorvastatin):   40 mg tablet  SIG-  1 tab(s)   once [name] (at bedtime)           Amlodipine  (Norvasc) Besylate:  5 mg-20 mg capsule  SIG-   1 cap(s)   once [name]           Comment-     Comment-       Coreg (Carvedilol):   12.5 mg tablet  SIG-  1 tab(s)   2 times [name]    Alphagan P: 0.15% solution SIG-  Dose-  Freq-   1 drop bothe eyes three times [name]    Trusopt: 2% solution SIG-  Dose-  Freq-   1 drop both eyes three times [name]    Travatan Z: 0.004% solution SIG-  Dose-  Freq-   1 drop both eyes at bedtime   TOBACCO: Never smoker   ICD#V13.89   No exposure to tobacco.  SOCIAL HISTORY: Starter Pick List - Social History  Single Disable  FAMILY HISTORY: Positive family history for  -   Glaucoma: Glaucoma  /  HTN Negative family history for  -   PARENTS: Glaucoma:;   Hypertension:   Father Glaucoma   Mother HTN CHILDREN: GRANDPARENTS: SIBLINGS:  Sister HTN UNCLES/AUNTS:   2-3 aunts Glaucoma OTHERS/DISTANT:    Family History - 1st Degree Relatives:  Mother dead.   Family History - 1st Degree Relatives:   Family History - 1st Degree Relatives:  Father is dead.  ALLERGIES: no beta blockers Drug Allergies.  No Known.  PHYSICAL EXAMINATION: [place]    04/15/2013 09:47  OD:CC: 20/30-  ph NI OS:CC: 20/30+  ph NI  EYEGLASSES: NONE (Rx. Glasses) OD: Wear OTC Readers                                              OS:+2.00 OU ADD:  MR:   OD:+1.00 + 0.50 x 108   20/25- OS  +1.25 sph   20/30- ADD:  +2.25  VF:   OD:  Motility: Full OU  PUPILS: 4mm OU not detected MG   EYELIDS & OCULAR ADNEXA:  SLE: Conjunctiva: 1-2 injection   Cornea:  Arcus  K-spindle OU   Anterior Chamber:+1 pig cell OU  Iris:  PERIPHERAL  IRIDECTOMY open   OU  Lens:1 -2 NS OU  Vitreous:  CCT:  Ta   in mmHg:                       OD:21                                        OS: 20 Time05/30/2014 10:30    Gonio:  Sup and Inf. TM nasal OD    Seems open to TM w/squeezing OS    Dilation:  Not Dilated secondary to narrow angles  Fundus:  optic nerve:   OD:      pallor  85-90% cup                                             OS:      90% cup inf. rim loss   Macula:       OD:                                                     OS:   Vessels:  Silver wire   OU  Periphery:   Visual Fields  severe constriction OU  ADMITTING DIAGNOSIS: Glaucoma, chronic angle-closure   ICD#365.23     mixed mechanism  uncontrolled without simbrinza OU  patient  can not get simbrinza    patient  at high likely hood of total vision loss.    Risk and benefits of surgery have been reviewed with the patient and the patient agrees to proceed with the surgical procedure.   Hypertension, benign ICD#401.1   Hypertensive retinopathy   ICD#362.11   Nuclear cataract NOS   ICD#366.04   Asthma - intrinsic, stable   ICD#493.10  SURGICAL TREATMENT PLAN: trabeculectomy  with [facility] OS first then OD     Risk and benefits of surgery have been reviewed with the patient and the patient agrees to proceed with the surgical procedure.      Actions:     Handouts: Calcium Channel Blockers, Hypertension, Lopid, Tegretol, Cyclobenzaprine.     ___________________________ Ethelda Deangelo, Jr. Starter - Inactive Problems:    Post Dramatic Syndrome    Stroke 2008  

## 2013-06-29 NOTE — Op Note (Signed)
Preoperative diagnosis: Uncontrolled angle-closure glaucoma right eye despite laser and medical therapy Postoperative diagnosis: Same Procedure trabeculectomy with mitomycin-C Anesthesia: 2% Xylocaine with epinephrine in a 50-50 mixture of 0.75% Marcaine with ample Wydase Procedure: The patient was transported to the operating room where she was given a peribulbar block with the aforementioned local anesthetic agent surgeon. Following this the patient's face was prepped and draped in the usual sterile fashion with a lid speculum inserted and operating microscope position with a surgeon at 12:00 a muscle hook was used to infraducted the eye and a 6-0 silk suture was passed through clear cornea the cornea was suture was used to fixate the globe following this a Hoskins forceps was used to grasp the conjunctiva in the superior nasal quadrant at the limbus an incision was made with a shot Wescott scissors extending superiorly following this the blunt Wescott scissors were used to carefully dissect posteriorly separating back several millimeters from the limbus the Tooke blade was used to recess T9 fibers bleeding was controlled with cautery using the Hoskins forceps to grasp the globe a 45 blade was used to fashion a half thickness scleral flap with the base of 4 mm the [place] forceps were then used to grasp the tip of the flap and a 5700 however blade was used to dissect to the limbus elevating the flap following this mitomycin-C 0.4 mg per cc which had been prepared was soaked and a Gelfoam sponge the sponge was then placed under the conjunctiva and allowed to stay in place for 3 minutes the sponge was then removed and the eye was irrigated with 40 cc of balanced salt solution. Following this using a 5100 blade through clear cornea at the 4:30 position the anterior chamber was entered the chamber shallow BSS was injected into the chamber and a small amount of Provisc was also injected into the anterior  chamber it is of note that the cornea of appeared to stay me throughout the case. Following this the scleral flap was elevated a 45 blade was used to into the anterior chamber under the scleral flap and Descemet's punch was used to fashion a 1 x 3 opening under the scleral flap there was iris prolapse the Chandler forceps was used to grasp the iris and a Vannas scissors were used to make an iridectomy the patient move at this point reexamination revealed iris and the opening therefore an additional amount of iris tissue was removed topical numbing drops were applied to the eye 2 interrupted 10-0 nylon sutures were placed to secure the sclera BSS was injected in the anterior chamber following this the conjunctiva was then closed with a 9-0 nylon suture on a BV 100 needle BSS was again injected in the anterior chamber to deepen the chamber and removed the Provisc the bleb elevated and it was Seidel negative with fluorescein staining. Following this a subconjunctival injection of Kenalog 4 mg is given in the inferior nasal quadrant all instrumentation were then removed from the eye including the suture fixation suture and lid speculum topical atropine drops were applied to the eye TobraDex ointment double patch and Fox U. were placed on the eye and the patient returned to recovery area in stable condition. Chalmers Guest Junior M.D.

## 2013-06-29 NOTE — Preoperative (Signed)
Beta Blockers   Reason not to administer Beta Blockers:received coreg today 

## 2013-06-29 NOTE — Anesthesia Preprocedure Evaluation (Signed)
Anesthesia Evaluation  Patient identified by MRN, date of birth, ID band Patient awake    Reviewed: Allergy & Precautions, H&P , NPO status , Patient's Chart, lab work & pertinent test results  Airway Mallampati: II  Neck ROM: full    Dental   Pulmonary asthma , COPDformer smoker,          Cardiovascular hypertension, + CAD, + Past MI and + Cardiac Stents     Neuro/Psych Seizures -,  PSYCHIATRIC DISORDERS PTSD Neuromuscular disease CVA, Residual Symptoms    GI/Hepatic (+) Hepatitis -, C  Endo/Other    Renal/GU      Musculoskeletal  (+) Arthritis -,   Abdominal   Peds  Hematology   Anesthesia Other Findings   Reproductive/Obstetrics                           Anesthesia Physical Anesthesia Plan  ASA: III  Anesthesia Plan: MAC   Post-op Pain Management:    Induction: Intravenous  Airway Management Planned: Nasal Cannula  Additional Equipment:   Intra-op Plan:   Post-operative Plan:   Informed Consent: I have reviewed the patients History and Physical, chart, labs and discussed the procedure including the risks, benefits and alternatives for the proposed anesthesia with the patient or authorized representative who has indicated his/her understanding and acceptance.     Plan Discussed with: CRNA, Anesthesiologist and Surgeon  Anesthesia Plan Comments:         Anesthesia Quick Evaluation

## 2013-06-29 NOTE — Progress Notes (Signed)
Follow up appt scheduled for 0900 on the day previously scheduled per Dr. Harlon Flor.

## 2013-06-29 NOTE — Transfer of Care (Signed)
Immediate Anesthesia Transfer of Care Note  Patient: [name]  Procedure(s) Performed: Procedure(s): TRABECULECTOMY WITH MYTOMICIN C LEFT EYE (Left)  Patient Location: PACU  Anesthesia Type:MAC  Level of Consciousness: awake, alert  and oriented  Airway & Oxygen Therapy: Patient Spontanous Breathing  Post-op Assessment: Report given to PACU RN and Post -op Vital signs reviewed and stable  Post vital signs: Reviewed  Complications: No apparent anesthesia complications

## 2013-06-29 NOTE — Progress Notes (Signed)
(  late entry)  Patient noted to be having severe pain on arrival to Phase II.  No Prescription found for after discharge.  Dr. Harlon Flor called with both concerns.  Also spoke with Dr. Ivin Booty regarding pain in Phase II, Dilaudid ordered, 0.5 mg every 5 min prn pain >3.  Patient received good relief with 1 mg of dilaudid.  Dr.Whitaker again called around 1615, reported that pain is controlled and patient is ready for discharge,  MD then called in RX for medication to [facility] on [address].

## 2013-06-29 NOTE — Anesthesia Postprocedure Evaluation (Signed)
Anesthesia Post Note  Patient: [name]  Procedure(s) Performed: Procedure(s) (LRB): TRABECULECTOMY WITH MYTOMICIN C LEFT EYE (Left)  Anesthesia type: MAC  Patient location: PACU  Post pain: Pain level controlled and Adequate analgesia  Post assessment: Post-op Vital signs reviewed, Patient's Cardiovascular Status Stable and Respiratory Function Stable  Last Vitals:  Filed Vitals:   06/29/13 1218  BP: 159/78  Pulse: 47  Temp: 36.2 C  Resp: 15    Post vital signs: Reviewed and stable  Level of consciousness: awake, alert  and oriented  Complications: No apparent anesthesia complications

## 2013-06-29 NOTE — Interval H&P Note (Signed)
History and Physical Interval Note:  06/29/2013 10:29 AM  [name]  has presented today for surgery, with the diagnosis of Chronic angle-closure glaucoma  The various methods of treatment have been discussed with the patient and family. After consideration of risks, benefits and other options for treatment, the patient has consented to  Procedure(s): TRABECULECTOMY WITH MYTOMICIN C LEFT EYE (Left) as a surgical intervention .  The patient's history has been reviewed, patient examined, no change in status, stable for surgery.  I have reviewed the patient's chart and labs.  Questions were answered to the patient's satisfaction.     Jefrey Raburn

## 2013-06-30 ENCOUNTER — Encounter (HOSPITAL_COMMUNITY): Payer: Self-pay | Admitting: Ophthalmology

## 2013-09-01 ENCOUNTER — Other Ambulatory Visit: Payer: Self-pay | Admitting: Ophthalmology

## 2013-09-01 MED ORDER — MITOMYCIN-C INJECTION USE IN OR ONLY (0.4 MG/ML): 0.5000 mL | Freq: Once | INTRAVENOUS | Status: DC

## 2013-09-01 MED ORDER — TETRACAINE HCL 0.5 % OP SOLN: 1.0000 [drp] | OPHTHALMIC | Status: AC

## 2013-09-01 NOTE — H&P (Signed)
History & Physical:   DATE: 09-01-13     NAME:  [name], [name]     [id number]       HISTORY OF PRESENT ILLNESS: Chief Eye Complaints glaucoma  WORSE OD    Post-op Trabec. w/[facility] OS and pre-op Trabec w/[facility] OD>  patient  states she thought Dr. Harlon Flor was going to remove a suture from OS today?  Is doing DP 4-5 x daily OS  HPI: EYES: Reports symptoms of     LOCATION:      QUALITY/COURSE:   Reports condition is   INTENSITY/SEVERITY:    Reports measurement ( or degree) as   DURATION:   Reports the general length of symptoms to be   ONSET/TIMING:   Reports occurrence as   CONTEXT/WHEN:   Reports usually associated with   MODIFIERS/TREATMENTS:  Improved by              ROS:   GEN- Constitutional: HENT:  PDSD GEN - Endocrine: Reports symptoms of LUNGS/Respiratory:  HEART/Cardiovascular: Reports symptoms of  HTN, stents, MI 2008 ABD/Gastrointestinal:   Musculoskeletal (BJE): NEURO/Neurological: stroke  2008 PSYCH/Psychiatric:    Is the pt oriented to time, place, person?  Mood depressed __ normal  agitated __  ACTIVE PROBLEMS: Elevated IOP right eye  stable after glaucoma surgery Left eye  Glaucoma, chronic angle-closure   ICD#365.23  Onset:   Initial Date:     mixed mechanism  uncontrolled OD  Hypertension, benign ICD#401.1  Onset:   Initial Date:    Hypertensive retinopathy   ICD#362.11  Onset:   Initial Date:    Nuclear cataract NOS   ICD#366.04  Onset:   Initial Date:    Asthma - intrinsic, stable   ICD#493.10  Onset:   Initial Date:  SURGERIES: Trabeculectomy w/[facility] OS 06/29/2013  PERIPHERAL  IRIDECTOMY   OD 02/04/2013  PERIPHERAL  IRIDECTOMY   OU 11/30/2012 Pick List - Surgeries  C-Section x2  MEDICATIONS: Pred Forte: Strength-  SIG-  OS BID    Aspirin:  81 mg tablet  SIG-  1 each   once a day    Flexeril (Cyclobenzaprine):   10 mg tablet  SIG-  1 each   3 times a day   Carbamazepine (Tegretol): 200 mg tablet  SIG-  1 each   2 times a day    Plavix (Clopidogrel):        75 mg tablet  SIG-  1 each   once a day     Lipitor (Atorvastatin):   40 mg tablet  SIG-  1 tab(s)   once a day (at bedtime)           Amlodipine (Norvasc) Besylate:  5 mg-20 mg capsule  SIG-   1 cap(s)   once a day           Comment-     Comment-       Coreg (Carvedilol):   12.5 mg tablet  SIG-  1 tab(s)   2 times a day    Alphagan P: 0.15% solution SIG-  Dose-  Freq-   1 drop OD  three times a day    Trusopt: 2% solution SIG-  Dose-  Freq-   1 drop OD  three times a day    Travatan Z: 0.004% solution SIG-  Dose-  Freq-   1 drop OD at bedtime  REVIEW OF SYSTEMS:  not found  TOBACCO: Never smoker   ICD#V13.89   No exposure to tobacco.  SOCIAL HISTORY: Starter Pick List - Social History  Single Disable  FAMILY HISTORY: Positive family history for  -   Glaucoma: Glaucoma  /HTN Negative family history for  -   PARENTS: Glaucoma:;   Hypertension:   Father Glaucoma   Mother HTN CHILDREN: GRANDPARENTS: SIBLINGS:  Sister HTN UNCLES/AUNTS:   2-3 aunts Glaucoma OTHERS/DISTANT:    Family History - 1st Degree Relatives:  Mother dead.   Family History - 1st Degree Relatives:   Family History - 1st Degree Relatives:  Father is dead.  ALLERGIES: no beta blockers Drug Allergies.  No Known.  PHYSICAL EXAMINATION: VS: BMI: 26.0.  BP: 160/84.  H: 66.00 in.  P: 76 /min.  RR: 16 /min.  W: 161lbs 0oz.    [place]  OD:CC 20/40 PH 20/NI OS:CC:20/70 PH 20/NI  EYEGLASSES: NONE (Rx. Glasses) OD: Wear OTC Readers                                              OS:+2.00 OU ADD:  MR:   OD:+1.00 + 0.50 x 108   20/25- OS  +1.25 sph   20/30- ADD:  +2.25  VF:   OD:  Motility: Full OU  PUPILS: 4mm OU not detected MG   EYELIDS & OCULAR ADNEXA:  normal    SLE: Conjunctiva: 1-2 injection OD ,  elevated  bleb - leak + 1 injection OS   Cornea:  Arcus  K-spindle OU   Anterior Chamber:  deep and quiet  OU   Iris:  PERIPHERAL  IRIDECTOMY open   OU  Lens:1 -2  NS OU  Vitreous:  CCT:  Ta   in mmHg:                       OD:   36                          OS:  16, 17 Time 09/01/2013 12:58   Gonio:  Sup and Inf. TM nasal OD    Seems open to TM w/squeezing OS    Dilation:  Not Dilated secondary to narrow angles & EIOP OU  Fundus:  optic nerve:   OD:      pallor 85-90% cup                                             OS:      90% cup inf. rim loss   Macula:       OD:                                                     OS:   Vessels:  Silver wire   OU  Periphery:   Visual Fields  severe constriction OU  Exam: GENERAL: Appearance: General appearance can be described as well-nourished, well-developed, and in no acute distress.    LYMPHATIC: HEAD, EARS, NOSE AND THROAT: Ears-Nose (external) Inspection: Externally, nose and ears are  normal in appearance and without scars, lesions, or nodules.      Otoscopic Exam: External auditory canals and tympanic membranes are normal.      Hearing assessment shows no problems with normal conversation.    Nose exam, internally, reveals nasal mucosa, septum and turbinates are unremarkable.    Teeth, Gingiva, and Lip Exams: No lesions or evidence of infection.      Oropharynx demonstrates oral mucosa, salivary glands, tongue, tonsils, posterior pharynx, hard-soft palates are normal.  EYES: see above  NECK: Neck tissue exam demonstrates no masses, symmetrical, and trachea is midline.      LUNGS and RESPIRATORY: Lung auscultation elicits no wheezing, rhonci, rales or rubs and with equal breath sounds.    Respiratory effort described as breathing is unlabored and chest movement is symmetrical.    HEART (Cardiovascular): Heart auscultation discovers regular rate and rhythm; no murmur, gallop or rub. Normal heart sounds.    ABDOMEN (Gastrointestinal): Mass/Tenderness Exam: Neither are present.     Liver/Spleen: No hepatomegaly or splenomegaly.   MUSCULOSKELETAL (BJE): Inspection-Palpation: No major bone,  joint, tendon, or muscle changes.      NEUROLOGICAL: Alert and oriented. No major deficits of coordination or sensation.      PSYCHIATRIC: Insight and judgment appear  both to be intact and appropriate.    Mood and affect are described as normal mood and full affect.    SKIN: Skin Inspection: No rashes or lesions.  ADMITTING DIAGNOSIS: Elevated IOP right eye  stable after glaucoma surgery Left eye  Glaucoma, chronic angle-closure   ICD#365.23  Onset:     mixed mechanism  uncontrolled OD  Hypertension, benign ICD#401.1  Onset:    Hypertensive retinopathy   ICD#362.11  Onset:    Nuclear cataract NOS   ICD#366.04  Onset:    Asthma - intrinsic, stable   ICD#493.10  Onset:  SURGICAL TREATMENT PLAN: trabeculectomy  with [facility] OD   Risk and benefits of surgery have been reviewed with the patient and the patient agrees to proceed with the surgical procedure.         ___________________________ Chalmers Guest, Tawni Millers - Inactive Problems:    Post Dramatic Syndrome    Stroke 2008

## 2013-09-02 ENCOUNTER — Encounter (HOSPITAL_COMMUNITY): Payer: Self-pay | Admitting: Pharmacy Technician

## 2013-09-05 ENCOUNTER — Encounter (HOSPITAL_COMMUNITY)
Admission: RE | Admit: 2013-09-05 | Discharge: 2013-09-05 | Disposition: A | Discharge status: Home / Self Care | Payer: Medicaid Other | Source: Ambulatory Visit | Attending: Ophthalmology | Admitting: Ophthalmology

## 2013-09-05 ENCOUNTER — Encounter (HOSPITAL_COMMUNITY): Payer: Self-pay

## 2013-09-05 HISTORY — DX: Headache: R51

## 2013-09-05 LAB — CBC
MCH: 30.1 pg (ref 26.0–34.0)
MCHC: 34.7 g/dL (ref 30.0–36.0)
Platelets: 214 10*3/uL (ref 150–400)

## 2013-09-05 LAB — COMPREHENSIVE METABOLIC PANEL
ALT: 26 U/L (ref 0–35)
AST: 38 U/L — ABNORMAL HIGH (ref 0–37)
Calcium: 9.5 mg/dL (ref 8.4–10.5)
Creatinine, Ser: 0.75 mg/dL (ref 0.50–1.10)
GFR calc Af Amer: >90 mL/min (ref >90)
Sodium: 140 mEq/L (ref 135–145)
Total Protein: 7.7 g/dL (ref 6.0–8.3)

## 2013-09-05 NOTE — Progress Notes (Signed)
Pt denies SOB and chest pain but is currently under the care of Dr. Sharyn Lull ( cardiology). Dr. Hosie Poisson office notified regarding pt instructions on Plavix and Aspirin; waiting for response from doctor.

## 2013-09-05 NOTE — Progress Notes (Signed)
Anesthesia Chart Review:  Patient is a 57 year old female posted for right eye trabeculectomy with Mitomycin C application on 09/07/13 by Dr. Harlon Flor.  She is s/p similar procedure on the left eye on 06/29/13.  See my preoperative anesthesia note from 06/28/13.  Per her PAT RN, patient denied any new CV symptoms at her PAT visit today. Labs noted.  She tolerated operative procedure within the past three months, so if no acute changes then I would anticipate that she could proceed as planned.  Velna Ochs [facility] Short Stay [facility]/Anesthesiology Phone [phone] 09/05/2013 1:55 PM

## 2013-09-06 NOTE — Progress Notes (Signed)
Spoke with Dr. Harlon Flor on 09/05/13 regarding pt instructions on Plavix and Aspirin. Pt was instructed to continue with both and to hold on morning of surgery according to MD.

## 2013-09-07 ENCOUNTER — Ambulatory Visit (HOSPITAL_COMMUNITY)
Admission: RE | Admit: 2013-09-07 | Discharge: 2013-09-07 | Disposition: A | Discharge status: Home / Self Care | Payer: Medicaid Other | Source: Ambulatory Visit | Attending: Ophthalmology | Admitting: Ophthalmology

## 2013-09-07 ENCOUNTER — Encounter (HOSPITAL_COMMUNITY): Payer: Medicaid Other | Admitting: Vascular Surgery

## 2013-09-07 ENCOUNTER — Encounter (HOSPITAL_COMMUNITY)
Admission: RE | Disposition: A | Discharge status: Home / Self Care | Payer: Self-pay | Source: Ambulatory Visit | Attending: Ophthalmology

## 2013-09-07 ENCOUNTER — Ambulatory Visit (HOSPITAL_COMMUNITY): Payer: Medicaid Other | Admitting: Certified Registered"

## 2013-09-07 ENCOUNTER — Encounter (HOSPITAL_COMMUNITY): Payer: Self-pay | Admitting: *Deleted

## 2013-09-07 DIAGNOSIS — J449 Chronic obstructive pulmonary disease, unspecified: Secondary | ICD-10-CM | POA: Insufficient documentation

## 2013-09-07 DIAGNOSIS — I69998 Other sequelae following unspecified cerebrovascular disease: Secondary | ICD-10-CM | POA: Insufficient documentation

## 2013-09-07 DIAGNOSIS — I252 Old myocardial infarction: Secondary | ICD-10-CM | POA: Insufficient documentation

## 2013-09-07 DIAGNOSIS — I2510 Atherosclerotic heart disease of native coronary artery without angina pectoris: Secondary | ICD-10-CM | POA: Insufficient documentation

## 2013-09-07 DIAGNOSIS — H409 Unspecified glaucoma: Secondary | ICD-10-CM | POA: Insufficient documentation

## 2013-09-07 DIAGNOSIS — H402290 Chronic angle-closure glaucoma, unspecified eye, stage unspecified: Secondary | ICD-10-CM | POA: Insufficient documentation

## 2013-09-07 DIAGNOSIS — H35039 Hypertensive retinopathy, unspecified eye: Secondary | ICD-10-CM | POA: Insufficient documentation

## 2013-09-07 DIAGNOSIS — H269 Unspecified cataract: Secondary | ICD-10-CM | POA: Insufficient documentation

## 2013-09-07 DIAGNOSIS — B1920 Unspecified viral hepatitis C without hepatic coma: Secondary | ICD-10-CM | POA: Insufficient documentation

## 2013-09-07 DIAGNOSIS — J4489 Other specified chronic obstructive pulmonary disease: Secondary | ICD-10-CM | POA: Insufficient documentation

## 2013-09-07 DIAGNOSIS — I10 Essential (primary) hypertension: Secondary | ICD-10-CM | POA: Insufficient documentation

## 2013-09-07 HISTORY — PX: MITOMYCIN C APPLICATION: SHX6375

## 2013-09-07 HISTORY — PX: TRABECULECTOMY: SHX107

## 2013-09-07 SURGERY — TRABECULECTOMY
Anesthesia: Monitor Anesthesia Care | Site: Eye | Laterality: Right | Wound class: Clean

## 2013-09-07 MED ORDER — HYPROMELLOSE (GONIOSCOPIC) 2.5 % OP SOLN
OPHTHALMIC | Status: AC
  Filled 2013-09-07: qty 15

## 2013-09-07 MED ORDER — TRIAMCINOLONE ACETONIDE 40 MG/ML IJ SUSP
INTRAMUSCULAR | Status: AC
  Filled 2013-09-07: qty 5

## 2013-09-07 MED ORDER — EPINEPHRINE HCL 1 MG/ML IJ SOLN
INTRAMUSCULAR | Status: AC
  Filled 2013-09-07: qty 1

## 2013-09-07 MED ORDER — ACETYLCHOLINE CHLORIDE 1:100 IO SOLR
INTRAOCULAR | Status: DC | PRN
  Administered 2013-09-07: 20 mg via INTRAOCULAR

## 2013-09-07 MED ORDER — TOBRAMYCIN 0.3 % OP OINT
TOPICAL_OINTMENT | OPHTHALMIC | Status: DC | PRN
  Administered 2013-09-07: 1 via OPHTHALMIC

## 2013-09-07 MED ORDER — TRIAMCINOLONE ACETONIDE 40 MG/ML IJ SUSP
INTRAMUSCULAR | Status: DC | PRN
  Administered 2013-09-07: .1 mL via INTRAMUSCULAR

## 2013-09-07 MED ORDER — BALANCED SALT IO SOLN
INTRAOCULAR | Status: DC | PRN
  Administered 2013-09-07: 15 mL via INTRAOCULAR

## 2013-09-07 MED ORDER — HEMOSTATIC AGENTS (NO CHARGE) OPTIME
TOPICAL | Status: DC | PRN
  Administered 2013-09-07: 1 via TOPICAL

## 2013-09-07 MED ORDER — ACETAZOLAMIDE SODIUM 500 MG IJ SOLR
INTRAMUSCULAR | Status: AC
  Filled 2013-09-07: qty 500

## 2013-09-07 MED ORDER — BSS IO SOLN
INTRAOCULAR | Status: AC
  Filled 2013-09-07: qty 15

## 2013-09-07 MED ORDER — GENTAMICIN SULFATE 40 MG/ML IJ SOLN
INTRAMUSCULAR | Status: AC
  Filled 2013-09-07: qty 2

## 2013-09-07 MED ORDER — MINERAL OIL LIGHT 100 % EX OIL
TOPICAL_OIL | CUTANEOUS | Status: AC
  Filled 2013-09-07: qty 25

## 2013-09-07 MED ORDER — BSS IO SOLN
INTRAOCULAR | Status: AC
  Filled 2013-09-07: qty 500

## 2013-09-07 MED ORDER — PROPOFOL 10 MG/ML IV BOLUS
INTRAVENOUS | Status: DC | PRN
  Administered 2013-09-07: 60 mg via INTRAVENOUS

## 2013-09-07 MED ORDER — BACITRACIN-POLYMYXIN B 500-10000 UNIT/GM OP OINT
TOPICAL_OINTMENT | OPHTHALMIC | Status: AC
  Filled 2013-09-07: qty 3.5

## 2013-09-07 MED ORDER — BSS IO SOLN
INTRAOCULAR | Status: DC | PRN
  Administered 2013-09-07: 200 mL via INTRAOCULAR

## 2013-09-07 MED ORDER — SODIUM HYALURONATE 10 MG/ML IO SOLN
INTRAOCULAR | Status: DC | PRN
  Administered 2013-09-07: 0.85 mL via INTRAOCULAR

## 2013-09-07 MED ORDER — TOBRAMYCIN-DEXAMETHASONE 0.3-0.1 % OP OINT
TOPICAL_OINTMENT | OPHTHALMIC | Status: AC
  Filled 2013-09-07: qty 3.5

## 2013-09-07 MED ORDER — BUPIVACAINE HCL (PF) 0.75 % IJ SOLN
INTRAMUSCULAR | Status: AC
  Filled 2013-09-07: qty 10

## 2013-09-07 MED ORDER — STERILE WATER FOR IRRIGATION IR SOLN
Status: DC | PRN
  Administered 2013-09-07: 100 mL

## 2013-09-07 MED ORDER — DEXAMETHASONE SODIUM PHOSPHATE 10 MG/ML IJ SOLN
INTRAMUSCULAR | Status: AC
  Filled 2013-09-07: qty 1

## 2013-09-07 MED ORDER — TETRACAINE HCL 0.5 % OP SOLN
OPHTHALMIC | Status: DC | PRN
  Administered 2013-09-07: 2 [drp] via OPHTHALMIC

## 2013-09-07 MED ORDER — ACETYLCHOLINE CHLORIDE 1:100 IO SOLR
INTRAOCULAR | Status: AC
  Filled 2013-09-07: qty 1

## 2013-09-07 MED ORDER — TETRACAINE HCL 0.5 % OP SOLN
OPHTHALMIC | Status: AC
  Filled 2013-09-07: qty 2

## 2013-09-07 MED ORDER — BSS PLUS IO SOLN
INTRAOCULAR | Status: AC
  Filled 2013-09-07: qty 500

## 2013-09-07 MED ORDER — ATROPINE SULFATE 1 % OP SOLN
OPHTHALMIC | Status: DC | PRN
  Administered 2013-09-07: 1 [drp] via OPHTHALMIC

## 2013-09-07 MED ORDER — FLUORESCEIN SODIUM 1 MG OP STRP
ORAL_STRIP | OPHTHALMIC | Status: DC | PRN
  Administered 2013-09-07: 1 via OPHTHALMIC

## 2013-09-07 MED ORDER — LIDOCAINE HCL (CARDIAC) 20 MG/ML IV SOLN
INTRAVENOUS | Status: DC | PRN
  Administered 2013-09-07: 40 mg via INTRAVENOUS

## 2013-09-07 MED ORDER — GATIFLOXACIN 0.5 % OP SOLN
1.0000 [drp] | OPHTHALMIC | Status: AC
  Administered 2013-09-07 (×3): 1 [drp] via OPHTHALMIC
  Filled 2013-09-07: qty 2.5

## 2013-09-07 MED ORDER — SODIUM CHLORIDE 0.9 % IJ SOLN
INTRAMUSCULAR | Status: AC
  Filled 2013-09-07: qty 10

## 2013-09-07 MED ORDER — MITOMYCIN 0.2 MG OP KIT
PACK | OPHTHALMIC | Status: DC | PRN
  Administered 2013-09-07: 0.2 mg via OPHTHALMIC

## 2013-09-07 MED ORDER — SODIUM CHLORIDE 0.9 % IV SOLN
INTRAVENOUS | Status: DC | PRN
  Administered 2013-09-07: 14:00:00 via INTRAVENOUS

## 2013-09-07 MED ORDER — 0.9 % SODIUM CHLORIDE (POUR BTL) OPTIME
TOPICAL | Status: DC | PRN
  Administered 2013-09-07: 200 mL

## 2013-09-07 MED ORDER — ATROPINE SULFATE 1 % OP SOLN
OPHTHALMIC | Status: AC
  Filled 2013-09-07: qty 2

## 2013-09-07 MED ORDER — FLUORESCEIN SODIUM 1 MG OP STRP
ORAL_STRIP | OPHTHALMIC | Status: AC
  Filled 2013-09-07: qty 2

## 2013-09-07 MED ORDER — POLYMYXIN B SULFATE 500000 UNITS IJ SOLR
INTRAMUSCULAR | Status: AC
  Filled 2013-09-07: qty 1

## 2013-09-07 MED ORDER — LIDOCAINE-EPINEPHRINE 2 %-1:100000 IJ SOLN
INTRAMUSCULAR | Status: DC | PRN
  Administered 2013-09-07: 14:00:00 via RETROBULBAR

## 2013-09-07 MED ORDER — SODIUM HYALURONATE 10 MG/ML IO SOLN
INTRAOCULAR | Status: AC
  Filled 2013-09-07: qty 0.85

## 2013-09-07 MED ORDER — HYDROMORPHONE HCL PF 1 MG/ML IJ SOLN: 0.2500 mg | INTRAMUSCULAR | Status: DC | PRN

## 2013-09-07 SURGICAL SUPPLY — 43 items
APPLICATOR COTTON TIP 6IN STRL (MISCELLANEOUS) ×2 IMPLANT
APPLICATOR DR MATTHEWS STRL (MISCELLANEOUS) ×2 IMPLANT
BLADE EYE CATARACT 19 1.4 BEAV (BLADE) IMPLANT
BLADE MINI RND TIP GREEN BEAV (BLADE) IMPLANT
BLADE STAB KNIFE 45DEG (BLADE) ×2 IMPLANT
CANISTER SUCTION 2500CC (MISCELLANEOUS) IMPLANT
CLOTH BEACON ORANGE TIMEOUT ST (SAFETY) ×2 IMPLANT
CORDS BIPOLAR (ELECTRODE) ×2 IMPLANT
COVER MAYO STAND STRL (DRAPES) IMPLANT
DRAPE OPHTHALMIC 40X48 W POUCH (DRAPES) ×2 IMPLANT
DRAPE RETRACTOR (MISCELLANEOUS) ×2 IMPLANT
ERASER HMR WETFIELD 23G BP (MISCELLANEOUS) IMPLANT
GLOVE BIO SURGEON STRL SZ8 (GLOVE) ×4 IMPLANT
GLOVE ECLIPSE 7.0 STRL STRAW (GLOVE) ×2 IMPLANT
GLOVE SURG SS PI 6.5 STRL IVOR (GLOVE) ×2 IMPLANT
GLOVE SURG SS PI 7.0 STRL IVOR (GLOVE) ×2 IMPLANT
GOWN STRL NON-REIN LRG LVL3 (GOWN DISPOSABLE) ×4 IMPLANT
KIT BASIN OR (CUSTOM PROCEDURE TRAY) ×2 IMPLANT
KIT ROOM TURNOVER OR (KITS) ×2 IMPLANT
KNIFE GRIESHABER SHARP 2.5MM (MISCELLANEOUS) ×2 IMPLANT
MASK EYE SHIELD (GAUZE/BANDAGES/DRESSINGS) ×2 IMPLANT
NEEDLE 25GX 5/8IN NON SAFETY (NEEDLE) ×2 IMPLANT
NEEDLE HYPO 30X.5 LL (NEEDLE) ×2 IMPLANT
NS IRRIG 1000ML POUR BTL (IV SOLUTION) ×2 IMPLANT
PACK CATARACT CUSTOM (CUSTOM PROCEDURE TRAY) ×2 IMPLANT
PAD ARMBOARD 7.5X6 YLW CONV (MISCELLANEOUS) ×4 IMPLANT
PAD EYE OVAL STERILE LF (GAUZE/BANDAGES/DRESSINGS) ×4 IMPLANT
SPEAR EYE SURG WECK-CEL (MISCELLANEOUS) IMPLANT
SPECIMEN JAR SMALL (MISCELLANEOUS) IMPLANT
SPONGE SURGIFOAM ABS GEL 12-7 (HEMOSTASIS) ×2 IMPLANT
SUT ETHILON 10 0 CS140 6 (SUTURE) ×2 IMPLANT
SUT ETHILON 9 0 BV100 4 (SUTURE) ×2 IMPLANT
SUT SILK 6 0 G 6 (SUTURE) ×2 IMPLANT
SUT VICRYL 9-0 (SUTURE) IMPLANT
SYR 20CC LL (SYRINGE) ×4 IMPLANT
SYR 50ML SLIP (SYRINGE) ×2 IMPLANT
SYR TB 1ML LUER SLIP (SYRINGE) IMPLANT
TAPE SURG TRANSPORE 1 IN (GAUZE/BANDAGES/DRESSINGS) ×1 IMPLANT
TAPE SURGICAL TRANSPORE 1 IN (GAUZE/BANDAGES/DRESSINGS) ×1
TOWEL OR 17X24 6PK STRL BLUE (TOWEL DISPOSABLE) ×4 IMPLANT
TUBE CONNECTING 12X1/4 (SUCTIONS) IMPLANT
WATER STERILE IRR 1000ML POUR (IV SOLUTION) ×2 IMPLANT
WIPE INSTRUMENT VISIWIPE 73X73 (MISCELLANEOUS) ×2 IMPLANT

## 2013-09-07 NOTE — Anesthesia Preprocedure Evaluation (Addendum)
Anesthesia Evaluation  Patient identified by MRN, date of birth, ID band Patient awake    Reviewed: Allergy & Precautions, H&P , NPO status , Patient's Chart, lab work & pertinent test results, reviewed documented beta blocker date and time   Airway Mallampati: II TM Distance: >3 FB Neck ROM: Full    Dental  (+) Teeth Intact, Missing, Poor Dentition and Dental Advisory Given,    Pulmonary asthma , pneumonia -, resolved, COPD COPD inhaler,          Cardiovascular hypertension, Pt. on home beta blockers + CAD and + Past MI     Neuro/Psych Seizures -, Well Controlled,   Neuromuscular disease CVA, Residual Symptoms    GI/Hepatic (+) Hepatitis -, C  Endo/Other    Renal/GU      Musculoskeletal   Abdominal   Peds  Hematology   Anesthesia Other Findings   Reproductive/Obstetrics                           Anesthesia Physical Anesthesia Plan  ASA: III  Anesthesia Plan: MAC   Post-op Pain Management:    Induction:   Airway Management Planned: Simple Face Mask  Additional Equipment:   Intra-op Plan:   Post-operative Plan:   Informed Consent: I have reviewed the patients History and Physical, chart, labs and discussed the procedure including the risks, benefits and alternatives for the proposed anesthesia with the patient or authorized representative who has indicated his/her understanding and acceptance.   Dental advisory given  Plan Discussed with: CRNA, Anesthesiologist and Surgeon  Anesthesia Plan Comments:         Anesthesia Quick Evaluation

## 2013-09-07 NOTE — H&P (View-Only) (Signed)
                  History & Physical:   DATE: 09-01-13     NAME:  [name], [name]     [id number]       HISTORY OF PRESENT ILLNESS: Chief Eye Complaints glaucoma  WORSE OD    Post-op Trabec. w/[facility] OS and pre-op Trabec w/[facility] OD>  patient  states she thought Dr. Nadia Torr was going to remove [name] suture from OS today?  Is doing DP 4-5 x daily OS  HPI: EYES: Reports symptoms of     LOCATION:      QUALITY/COURSE:   Reports condition is   INTENSITY/SEVERITY:    Reports measurement ( or degree) as   DURATION:   Reports the general length of symptoms to be   ONSET/TIMING:   Reports occurrence as   CONTEXT/WHEN:   Reports usually associated with   MODIFIERS/TREATMENTS:  Improved by              ROS:   GEN- Constitutional: HENT:  PDSD GEN - Endocrine: Reports symptoms of LUNGS/Respiratory:  HEART/Cardiovascular: Reports symptoms of  HTN, stents, MI 2008 ABD/Gastrointestinal:   Musculoskeletal (BJE): NEURO/Neurological: stroke  2008 PSYCH/Psychiatric:    Is the pt oriented to time, place, person?  Mood depressed __ normal  agitated __  ACTIVE PROBLEMS: Elevated IOP right eye  stable after glaucoma surgery Left eye  Glaucoma, chronic angle-closure   ICD#365.23  Onset:   Initial Date:     mixed mechanism  uncontrolled OD  Hypertension, benign ICD#401.1  Onset:   Initial Date:    Hypertensive retinopathy   ICD#362.11  Onset:   Initial Date:    Nuclear cataract NOS   ICD#366.04  Onset:   Initial Date:    Asthma - intrinsic, stable   ICD#493.10  Onset:   Initial Date:  SURGERIES: Trabeculectomy w/[facility] OS 06/29/2013  PERIPHERAL  IRIDECTOMY   OD 02/04/2013  PERIPHERAL  IRIDECTOMY   OU 11/30/2012 Pick List - Surgeries  C-Section x2  MEDICATIONS: Pred Forte: Strength-  SIG-  OS BID    Aspirin:  81 mg tablet  SIG-  1 each   once [name]    Flexeril (Cyclobenzaprine):   10 mg tablet  SIG-  1 each   3 times [name]   Carbamazepine (Tegretol): 200 mg tablet  SIG-  1 each   2 times [name]    Plavix (Clopidogrel):        75 mg tablet  SIG-  1 each   once [name]     Lipitor (Atorvastatin):   40 mg tablet  SIG-  1 tab(s)   once [name] (at bedtime)           Amlodipine (Norvasc) Besylate:  5 mg-20 mg capsule  SIG-   1 cap(s)   once [name]           Comment-     Comment-       Coreg (Carvedilol):   12.5 mg tablet  SIG-  1 tab(s)   2 times [name]    Alphagan P: 0.15% solution SIG-  Dose-  Freq-   1 drop OD  three times [name]    Trusopt: 2% solution SIG-  Dose-  Freq-   1 drop OD  three times [name]    Travatan Z: 0.004% solution SIG-  Dose-  Freq-   1 drop OD at bedtime  REVIEW OF SYSTEMS:   not found  TOBACCO: Never smoker   ICD#V13.89   No exposure to tobacco.  SOCIAL HISTORY: Starter Pick List - Social History  Single Disable  FAMILY HISTORY: Positive family history for  -   Glaucoma: Glaucoma  /HTN Negative family history for  -   PARENTS: Glaucoma:;   Hypertension:   Father Glaucoma   Mother HTN CHILDREN: GRANDPARENTS: SIBLINGS:  Sister HTN UNCLES/AUNTS:   2-3 aunts Glaucoma OTHERS/DISTANT:    Family History - 1st Degree Relatives:  Mother dead.   Family History - 1st Degree Relatives:   Family History - 1st Degree Relatives:  Father is dead.  ALLERGIES: no beta blockers Drug Allergies.  No Known.  PHYSICAL EXAMINATION: VS: BMI: 26.0.  BP: 160/84.  H: 66.00 in.  P: 76 /min.  RR: 16 /min.  W: 161lbs 0oz.    Va  OD:CC 20/40 PH 20/NI OS:CC:20/70 PH 20/NI  EYEGLASSES: NONE (Rx. Glasses) OD: Wear OTC Readers                                              OS:+2.00 OU ADD:  MR:   OD:+1.00 + 0.50 x 108   20/25- OS  +1.25 sph   20/30- ADD:  +2.25  VF:   OD:  Motility: Full OU  PUPILS: 4mm OU not detected MG   EYELIDS & OCULAR ADNEXA:  normal    SLE: Conjunctiva: 1-2 injection OD ,  elevated  bleb - leak + 1 injection OS   Cornea:  Arcus  K-spindle OU   Anterior Chamber:  deep and quiet  OU   Iris:  PERIPHERAL  IRIDECTOMY open   OU  Lens:1 -2  NS OU  Vitreous:  CCT:  Ta   in mmHg:                       OD:   36                          OS:  16, 17 Time 09/01/2013 12:58   Gonio:  Sup and Inf. TM nasal OD    Seems open to TM w/squeezing OS    Dilation:  Not Dilated secondary to narrow angles & EIOP OU  Fundus:  optic nerve:   OD:      pallor 85-90% cup                                             OS:      90% cup inf. rim loss   Macula:       OD:                                                     OS:   Vessels:  Silver wire   OU  Periphery:   Visual Fields  severe constriction OU  Exam: GENERAL: Appearance: General appearance can be described as well-nourished, well-developed, and in no acute distress.    LYMPHATIC: HEAD, EARS, NOSE AND THROAT: Ears-Nose (external) Inspection: Externally, nose and ears are   normal in appearance and without scars, lesions, or nodules.      Otoscopic Exam: External auditory canals and tympanic membranes are normal.      Hearing assessment shows no problems with normal conversation.    Nose exam, internally, reveals nasal mucosa, septum and turbinates are unremarkable.    Teeth, Gingiva, and Lip Exams: No lesions or evidence of infection.      Oropharynx demonstrates oral mucosa, salivary glands, tongue, tonsils, posterior pharynx, hard-soft palates are normal.  EYES: see above  NECK: Neck tissue exam demonstrates no masses, symmetrical, and trachea is midline.      LUNGS and RESPIRATORY: Lung auscultation elicits no wheezing, rhonci, rales or rubs and with equal breath sounds.    Respiratory effort described as breathing is unlabored and chest movement is symmetrical.    HEART (Cardiovascular): Heart auscultation discovers regular rate and rhythm; no murmur, gallop or rub. Normal heart sounds.    ABDOMEN (Gastrointestinal): Mass/Tenderness Exam: Neither are present.     Liver/Spleen: No hepatomegaly or splenomegaly.   MUSCULOSKELETAL (BJE): Inspection-Palpation: No major bone,  joint, tendon, or muscle changes.      NEUROLOGICAL: Alert and oriented. No major deficits of coordination or sensation.      PSYCHIATRIC: Insight and judgment appear  both to be intact and appropriate.    Mood and affect are described as normal mood and full affect.    SKIN: Skin Inspection: No rashes or lesions.  ADMITTING DIAGNOSIS: Elevated IOP right eye  stable after glaucoma surgery Left eye  Glaucoma, chronic angle-closure   ICD#365.23  Onset:     mixed mechanism  uncontrolled OD  Hypertension, benign ICD#401.1  Onset:    Hypertensive retinopathy   ICD#362.11  Onset:    Nuclear cataract NOS   ICD#366.04  Onset:    Asthma - intrinsic, stable   ICD#493.10  Onset:  SURGICAL TREATMENT PLAN: trabeculectomy  with [facility] OD   Risk and benefits of surgery have been reviewed with the patient and the patient agrees to proceed with the surgical procedure.         ___________________________ Prestina Raigoza, Jr. Starter - Inactive Problems:    Post Dramatic Syndrome    Stroke 2008  

## 2013-09-07 NOTE — Anesthesia Postprocedure Evaluation (Signed)
  Anesthesia Post-op Note  Patient: [name]  Procedure(s) Performed: Procedure(s): TRABECULECTOMY RIGHT EYE (Right) MITOMYCIN C APPLICATION RIGHT EYE (Right)  Patient Location: PACU  Anesthesia Type:MAC  Level of Consciousness: awake  Airway and Oxygen Therapy: Patient Spontanous Breathing  Post-op Pain: mild  Post-op Assessment: Post-op Vital signs reviewed  Post-op Vital Signs: Reviewed  Complications: No apparent anesthesia complications

## 2013-09-07 NOTE — Interval H&P Note (Signed)
History and Physical Interval Note:  09/07/2013 11:06 AM  [name]  has presented today for surgery, with the diagnosis of TRABECULAR WITH [facility] RIGHT EYE  The various methods of treatment have been discussed with the patient and family. After consideration of risks, benefits and other options for treatment, the patient has consented to  Procedure(s): TRABECULECTOMY RIGHT EYE (Right) MITOMYCIN C APPLICATION RIGHT EYE (Right) as a surgical intervention .  The patient's history has been reviewed, patient examined, no change in status, stable for surgery.  I have reviewed the patient's chart and labs.  Questions were answered to the patient's satisfaction.     [name]

## 2013-09-07 NOTE — Preoperative (Signed)
Beta Blockers   Reason not to administer Beta Blockers:Not Applicable 

## 2013-09-07 NOTE — Transfer of Care (Signed)
Immediate Anesthesia Transfer of Care Note  Patient: [name]  Procedure(s) Performed: Procedure(s): TRABECULECTOMY RIGHT EYE (Right) MITOMYCIN C APPLICATION RIGHT EYE (Right)  Patient Location: PACU  Anesthesia Type:MAC  Level of Consciousness: awake, alert  and oriented  Airway & Oxygen Therapy: Patient Spontanous Breathing and Patient connected to nasal cannula oxygen  Post-op Assessment: Report given to PACU RN and Post -op Vital signs reviewed and stable  Post vital signs: Reviewed and stable  Complications: No apparent anesthesia complications

## 2013-09-07 NOTE — Op Note (Signed)
Preoperative diagnosis: Uncontrolled mixed mechanism glaucoma right eye Postoperative diagnosis: Same Procedure: Trabeculectomy with mitomycin-C Anesthesia: 2% Xylocaine in a 50-50 mixture 0.75% Marcaine with ample Wydase with epinephrine Complications: None Procedure: The patient has would've to the operating room where she was given a peribulbar block with the aforementioned local anesthetic agent following this the patient's face was prepped and draped in the usual sterile fashion with a surgeon sitting at 12:00 and the operating microscope in position a muscle hook was used to infraducted the eye and a 6-0 nylon suture was passed through clear cornea to infraducted the eye and additional 6-0 nylon suture was also passed through clear cornea to infraducted the eye. An incision was made at the limbus using a shot Wescott scissors extending approximately 7 mm nasal blunt Wescott scissors were then used to dissect posteriorly extending toward the fornix forming a fornix-based conjunctival flap a Tooke blade was used to recess T9 fibers bleeding was controlled with cautery. A 45 blade was then used to fashion a half thickness scleral flap with the base at the limbus a 4 mm the [place] Col[place] forceps were then used with degrees Hopper 5700 blade to dissect to the limbus identified the limbal tissues mitomycin-C 0.4 mg per cc in the form a Mit9osol all was placed onto Gelfoam sponges that had been presoaked and allowed to stay under the conjunctiva for 4 minutes the sponges were then removed the eye was irrigated with 40 cc about salt solution a paracentesis was formed into the anterior chamber at the 8:00 position. Attention was then turned to the scleral flap which was elevated and a 45 blade was used to enter the anterior chamber under the scleral flap with the egress of fluid the Descemet's punch was then used to punch a 3.5 mm block of tissue removing it from the eye there was some iris adhesions to the  tissue a basal peripheral iridectomy was performed using the Vannas scissors and a Chandler forceps. BSS was used for irrigation and topical anesthetic was again applied to the patient's side the sclera was sutured with 3 interrupted 10-0 nylon sutures the conjunctiva was then closed with a 9-0 Vicryl on a BV 100 needle on each corner BSS was injected to deepen the anterior chamber the incision was stay was Seidel and noted to be Seidel negative. A subconjunctival injection of Kenalog 4 mg is given in the inferior nasal subconjunctival space. Topical TobraDex ointment topical atropine were applied to the eye a patch and Fox U. were placed and the patient returned to recovery area in stable condition Chalmers Guest Junior M.D.

## 2013-09-08 ENCOUNTER — Encounter (HOSPITAL_COMMUNITY): Payer: Self-pay | Admitting: Ophthalmology

## 2013-09-23 ENCOUNTER — Encounter (HOSPITAL_COMMUNITY): Payer: Self-pay | Admitting: Ophthalmology

## 2013-09-23 NOTE — Addendum Note (Signed)
Addendum created 09/23/13 1159 by Judie Petit, MD   Modules edited: Anesthesia Events

## 2013-11-05 ENCOUNTER — Emergency Department (HOSPITAL_COMMUNITY)
Admission: EM | Admit: 2013-11-05 | Discharge: 2013-11-05 | Disposition: A | Discharge status: Home / Self Care | Payer: Medicaid Other | Attending: Emergency Medicine | Admitting: Emergency Medicine

## 2013-11-05 ENCOUNTER — Emergency Department (HOSPITAL_COMMUNITY): Payer: Medicaid Other

## 2013-11-05 ENCOUNTER — Encounter (HOSPITAL_COMMUNITY): Payer: Self-pay | Admitting: Emergency Medicine

## 2013-11-05 DIAGNOSIS — Z8701 Personal history of pneumonia (recurrent): Secondary | ICD-10-CM | POA: Insufficient documentation

## 2013-11-05 DIAGNOSIS — Z8673 Personal history of transient ischemic attack (TIA), and cerebral infarction without residual deficits: Secondary | ICD-10-CM | POA: Insufficient documentation

## 2013-11-05 DIAGNOSIS — J4489 Other specified chronic obstructive pulmonary disease: Secondary | ICD-10-CM | POA: Insufficient documentation

## 2013-11-05 DIAGNOSIS — M545 Low back pain, unspecified: Secondary | ICD-10-CM | POA: Insufficient documentation

## 2013-11-05 DIAGNOSIS — E785 Hyperlipidemia, unspecified: Secondary | ICD-10-CM | POA: Insufficient documentation

## 2013-11-05 DIAGNOSIS — M542 Cervicalgia: Secondary | ICD-10-CM | POA: Insufficient documentation

## 2013-11-05 DIAGNOSIS — R110 Nausea: Secondary | ICD-10-CM | POA: Insufficient documentation

## 2013-11-05 DIAGNOSIS — G8929 Other chronic pain: Secondary | ICD-10-CM | POA: Insufficient documentation

## 2013-11-05 DIAGNOSIS — Z7982 Long term (current) use of aspirin: Secondary | ICD-10-CM | POA: Insufficient documentation

## 2013-11-05 DIAGNOSIS — J449 Chronic obstructive pulmonary disease, unspecified: Secondary | ICD-10-CM | POA: Insufficient documentation

## 2013-11-05 DIAGNOSIS — Z87891 Personal history of nicotine dependence: Secondary | ICD-10-CM | POA: Insufficient documentation

## 2013-11-05 DIAGNOSIS — Z9861 Coronary angioplasty status: Secondary | ICD-10-CM | POA: Insufficient documentation

## 2013-11-05 DIAGNOSIS — Z8659 Personal history of other mental and behavioral disorders: Secondary | ICD-10-CM | POA: Insufficient documentation

## 2013-11-05 DIAGNOSIS — Z79899 Other long term (current) drug therapy: Secondary | ICD-10-CM | POA: Insufficient documentation

## 2013-11-05 DIAGNOSIS — M791 Myalgia, unspecified site: Secondary | ICD-10-CM

## 2013-11-05 DIAGNOSIS — H409 Unspecified glaucoma: Secondary | ICD-10-CM | POA: Insufficient documentation

## 2013-11-05 DIAGNOSIS — Z8619 Personal history of other infectious and parasitic diseases: Secondary | ICD-10-CM | POA: Insufficient documentation

## 2013-11-05 DIAGNOSIS — Z7902 Long term (current) use of antithrombotics/antiplatelets: Secondary | ICD-10-CM | POA: Insufficient documentation

## 2013-11-05 DIAGNOSIS — I2510 Atherosclerotic heart disease of native coronary artery without angina pectoris: Secondary | ICD-10-CM | POA: Insufficient documentation

## 2013-11-05 DIAGNOSIS — I10 Essential (primary) hypertension: Secondary | ICD-10-CM | POA: Insufficient documentation

## 2013-11-05 DIAGNOSIS — M25519 Pain in unspecified shoulder: Secondary | ICD-10-CM | POA: Insufficient documentation

## 2013-11-05 DIAGNOSIS — I252 Old myocardial infarction: Secondary | ICD-10-CM | POA: Insufficient documentation

## 2013-11-05 DIAGNOSIS — M129 Arthropathy, unspecified: Secondary | ICD-10-CM | POA: Insufficient documentation

## 2013-11-05 DIAGNOSIS — G40909 Epilepsy, unspecified, not intractable, without status epilepticus: Secondary | ICD-10-CM | POA: Insufficient documentation

## 2013-11-05 LAB — URINALYSIS, ROUTINE W REFLEX MICROSCOPIC
Glucose, UA: NEGATIVE mg/dL
Ketones, ur: NEGATIVE mg/dL
Leukocytes, UA: NEGATIVE
Protein, ur: NEGATIVE mg/dL
Specific Gravity, Urine: 1.015 (ref 1.005–1.030)
pH: 5.5 (ref 5.0–8.0)

## 2013-11-05 LAB — COMPREHENSIVE METABOLIC PANEL
AST: 40 U/L — ABNORMAL HIGH (ref 0–37)
Albumin: 4.3 g/dL (ref 3.5–5.2)
Alkaline Phosphatase: 47 U/L (ref 39–117)
BUN: 16 mg/dL (ref 6–23)
CO2: 26 mEq/L (ref 19–32)
Calcium: 9.9 mg/dL (ref 8.4–10.5)
Chloride: 100 mEq/L (ref 96–112)
Creatinine, Ser: 1.18 mg/dL — ABNORMAL HIGH (ref 0.50–1.10)
Total Bilirubin: 0.5 mg/dL (ref 0.3–1.2)
Total Protein: 8.2 g/dL (ref 6.0–8.3)

## 2013-11-05 LAB — CBC WITH DIFFERENTIAL/PLATELET
Basophils Absolute: 0 10*3/uL (ref 0.0–0.1)
Basophils Relative: 0 % (ref 0–1)
Eosinophils Absolute: 0.1 10*3/uL (ref 0.0–0.7)
Eosinophils Relative: 1 % (ref 0–5)
HCT: 37.4 % (ref 36.0–46.0)
Hemoglobin: 13.3 g/dL (ref 12.0–15.0)
MCH: 30.7 pg (ref 26.0–34.0)
MCHC: 35.6 g/dL (ref 30.0–36.0)
MCV: 86.4 fL (ref 78.0–100.0)
Monocytes Absolute: 0.8 10*3/uL (ref 0.1–1.0)
Monocytes Relative: 7 % (ref 3–12)
Platelets: 244 10*3/uL (ref 150–400)
RDW: 12.4 % (ref 11.5–15.5)

## 2013-11-05 LAB — D-DIMER, QUANTITATIVE: D-Dimer, Quant: <0.27 ug/mL-FEU (ref 0.00–0.48)

## 2013-11-05 LAB — ETHANOL: Alcohol, Ethyl (B): <11 mg/dL (ref 0–11)

## 2013-11-05 LAB — RAPID URINE DRUG SCREEN, HOSP PERFORMED
Amphetamines: NOT DETECTED
Benzodiazepines: NOT DETECTED
Opiates: NOT DETECTED
Tetrahydrocannabinol: POSITIVE — AB

## 2013-11-05 MED ORDER — CYCLOBENZAPRINE HCL 10 MG PO TABS
10.0000 mg | ORAL_TABLET | Freq: Once | ORAL | Status: AC
  Administered 2013-11-05: 10 mg via ORAL
  Filled 2013-11-05: qty 1

## 2013-11-05 MED ORDER — NAPROXEN 500 MG PO TABS: 500.0000 mg | ORAL_TABLET | Freq: Two times a day (BID) | ORAL | Status: DC

## 2013-11-05 MED ORDER — CYCLOBENZAPRINE HCL 10 MG PO TABS: 10.0000 mg | ORAL_TABLET | Freq: Three times a day (TID) | ORAL | Status: DC | PRN

## 2013-11-05 MED ORDER — KETOROLAC TROMETHAMINE 30 MG/ML IJ SOLN
30.0000 mg | Freq: Once | INTRAMUSCULAR | Status: AC
  Administered 2013-11-05: 30 mg via INTRAVENOUS
  Filled 2013-11-05: qty 1

## 2013-11-05 NOTE — ED Notes (Signed)
Pt smells of ETOH.

## 2013-11-05 NOTE — ED Notes (Signed)
C/o left upper back/ neck pain x 3 days. Denies cp or sob. Movement increases pain.

## 2013-11-05 NOTE — ED Provider Notes (Signed)
CSN: [id number]     Arrival date & time 11/05/13  1859 History   This chart was scribed for Ward Givens, MD by Bennett Scrape, ED Scribe. This patient was seen in room APA01/APA01 and the patient's care was started at 7:18 PM.   Chief Complaint  Patient presents with  . Back Pain  . Neck Pain    The history is provided by the patient. No language interpreter was used.    HPI Comments: [name] is a 57 y.o. female who presents to the Emergency Department complaining of 2 weeks of waxing and waning left upper back pain around the scapula that has been worse over the past 2 days. She reports that the pain actually starts in the left neck and radiates to the left shoulder and down the left arm where it localizes around the scapula. She describes the pain as a "swollen and excrutia" feeling and denies any pain with touch. She denies any pain with ROM or movement of the neck or left arm. She denies any recent falls, trauma, heavy lifting or changes in activity which could have triggered the symptoms. She lists associated nausea as "my mouth keeps filling up" for the past 2 weeks as well. She denies any prior episodes of the same. She denies any SOB, wheezing, cough, fever, emesis and diarrhea. She denies any triggers or modifying factors stating "It justs jumps on me". She denies any prior episodes. She has a h/o CVA and reports a chronic right-sided deficit and admits that she has to ambulate with a 3-4 pronged cane at baseline. She also admits that she is a frequent marijuana smoker and daily 0.5 ppd cigarette smoker. She also admits to alcohol use saying that she drinks " a beer or two" on the weekends. She admits to consuming one beer tonight.   PCP is with [facility]. Cardiologist is Dr. Sharyn Lull with visits every 2 to 3 months  Past Medical History  Diagnosis Date  . Adjustment disorder with depressed mood 01/11/2009  . CAD 07/10/2008  . COCAINE DEPENDENCY NOS 01/14/2007  .  CVA 01/14/2007  . HEPATITIS C 02/26/2009  . HYPERTENSION, BENIGN SYSTEMIC 01/14/2007  . TOBACCO DEPENDENCE 01/14/2007  . MI (myocardial infarction) 12/20/2007    had stent placed  . Coronary artery disease 01/14/2012  . Asthma   . COPD (chronic obstructive pulmonary disease)   . PTSD (post-traumatic stress disorder)   . Chronic back pain   . Sciatica   . Stroke   . Family history of anesthesia complication 04-23-1983    Pts sister died after having anesthesia  . PTSD (post-traumatic stress disorder)   . Right sided weakness     d/t stroke  . Memory loss     d/t CVA  . CONVULSIONS, SEIZURES, NOS 01/14/2007    last seizure was July 2014.  . Glaucoma of both eyes   . Pneumonia     hx of  . Arthritis   . Hyperlipemia   . UUVOZDGU(440.3)    Past Surgical History  Procedure Laterality Date  . Cesarean section      x 2  . Coronary angioplasty with stent placement  2008-04-22    1 stent  . Colonoscopy w/ polypectomy    . Tonsillectomy    . Trabeculectomy Left 06/29/2013    Procedure: TRABECULECTOMY WITH MYTOMICIN C LEFT EYE;  Surgeon: Chalmers Guest, MD;  Location: [facility] OR;  Service: Ophthalmology;  Laterality: Left;  . Trabeculectomy Right 09/07/2013  Procedure: TRABECULECTOMY RIGHT EYE;  Surgeon: Chalmers Guest, MD;  Location: [facility] OR;  Service: Ophthalmology;  Laterality: Right;  . Mitomycin c application Right 09/07/2013    Procedure: MITOMYCIN C APPLICATION RIGHT EYE;  Surgeon: Chalmers Guest, MD;  Location: [facility] OR;  Service: Ophthalmology;  Laterality: Right;   Family History  Problem Relation Age of Onset  . Stroke Mother   . Liver cancer Father     ??? with alcohol abuse  . Colon cancer Paternal Uncle   . Heart disease Sister   . Colon cancer Paternal Aunt    History  Substance Use Topics  . Smoking status: Former Smoker -- 0.50 packs/day    Types: Cigarettes    Quit date: 12/03/2011  . Smokeless tobacco: Never Used  . Alcohol Use: No     Comment: quit 4 years ago  smokes 1/4  ppd Drinks 1 beer on weekends Smokes THC, denies cocaine   No OB history provided.  Review of Systems  Constitutional: Negative for fever.  Respiratory: Negative for shortness of breath.   Gastrointestinal: Positive for nausea. Negative for vomiting and diarrhea.  Musculoskeletal: Positive for back pain.  All other systems reviewed and are negative.    Allergies  Review of patient's allergies indicates no known allergies.  Home Medications   Current Outpatient Rx  Name  Route  Sig  Dispense  Refill  . albuterol (PROVENTIL HFA;VENTOLIN HFA) 108 (90 BASE) MCG/ACT inhaler   Inhalation   Inhale 2 puffs into the lungs every 6 (six) hours as needed for wheezing. For wheezing         . amLODipine (NORVASC) 5 MG tablet   Oral   Take 5 mg by mouth daily.         [name] aspirin 81 MG EC tablet   Oral   Take 81 mg by mouth daily.           [name] atorvastatin (LIPITOR) 40 MG tablet   Oral   Take 40 mg by mouth daily.         . brimonidine (ALPHAGAN) 0.15 % ophthalmic solution   Both Eyes   Place 1 drop into both eyes 2 (two) times daily.          . carbamazepine (TEGRETOL) 200 MG tablet   Oral   Take 200 mg by mouth 2 (two) times daily.           . carvedilol (COREG) 12.5 MG tablet   Oral   Take 12.5 mg by mouth 2 (two) times daily with a meal.         . clopidogrel (PLAVIX) 75 MG tablet   Oral   Take 75 mg by mouth daily.           . cyclobenzaprine (FLEXERIL) 10 MG tablet   Oral   Take 5 mg by mouth 3 (three) times daily as needed. As needed for pain.         . dorzolamide (TRUSOPT) 2 % ophthalmic solution   Both Eyes   Place 1 drop into both eyes 2 (two) times daily.          [name] losartan-hydrochlorothiazide (HYZAAR) 100-12.5 MG per tablet   Oral   Take 1 tablet by mouth daily.         . meloxicam (MOBIC) 15 MG tablet   Oral   Take 15 mg by mouth daily as needed for pain. For pain         . nitroGLYCERIN (NITROSTAT)  0.4 MG SL tablet    Sublingual   Place 0.4 mg under the tongue every 5 (five) minutes as needed. x3 doses as needed for chest pain         . Travoprost, BAK Free, (TRAVATAN) 0.004 % SOLN ophthalmic solution   Both Eyes   Place 1 drop into both eyes at bedtime.          Triage Vitals: BP 108/73  Pulse 76  Temp(Src) 98.2 F (36.8 C) (Oral)  Resp 16  SpO2 98%  Vital signs normal    Physical Exam  Nursing note and vitals reviewed. Constitutional: She is oriented to person, place, and time. She appears well-developed and well-nourished.  Non-toxic appearance. She does not appear ill. No distress.  HENT:  Head: Normocephalic and atraumatic.  Right Ear: External ear normal.  Left Ear: External ear normal.  Nose: Nose normal. No mucosal edema or rhinorrhea.  Mouth/Throat: Oropharynx is clear and moist and mucous membranes are normal. No dental abscesses or uvula swelling.  Eyes: Conjunctivae and EOM are normal. Pupils are equal, round, and reactive to light.  Neck: Normal range of motion and full passive range of motion without pain. Neck supple.  Mild tenderness to the left trapezius   Cardiovascular: Normal rate, regular rhythm and normal heart sounds.  Exam reveals no gallop and no friction rub.   No murmur heard. Pulmonary/Chest: Effort normal and breath sounds normal. No respiratory distress. She has no wheezes. She has no rhonchi. She has no rales. She exhibits no tenderness and no crepitus.  Abdominal: Soft. Normal appearance and bowel sounds are normal. She exhibits no distension. There is no tenderness. There is no rebound and no guarding.  Musculoskeletal: She exhibits tenderness. She exhibits no edema.       Back:  Moves all extremities well. Tenderness to the inferior tip of the left scapula. Pain with leaning to the right. NO pain with leaning to the left. No pain with ROM of the left arm  Area of pain noted  Neurological: She is alert and oriented to person, place, and time. She has  normal strength. No cranial nerve deficit.  Skin: Skin is warm, dry and intact. No rash noted. No erythema. No pallor.  Psychiatric: She has a normal mood and affect. Her speech is normal and behavior is normal. Her mood appears not anxious.    ED Course  Procedures (including critical care time)  Medications  ketorolac (TORADOL) 30 MG/ML injection 30 mg (30 mg Intravenous Given 11/05/13 1959)  cyclobenzaprine (FLEXERIL) tablet 10 mg (10 mg Oral Given 11/05/13 1959)     DIAGNOSTIC STUDIES: Oxygen Saturation is 98% on room air, normal by my interpretation.    COORDINATION OF CARE: 7:35 PM-Advised opt that the symptoms seem musculoskeletal in nature. Discussed treatment plan with pt at bedside and pt agreed to plan.   9:53 PM- Pt rechecked and feels improved. Advised pt that the CXR, UA and blood work are normal. Informed pt of mild arthritis in the neck which is to be expected with aging. Discussed discharge plan with pt and pt agreed to plan. Also advised pt to follow up as needed and pt agreed. Addressed symptoms to return for with pt.   Labs Review Results for orders placed during the hospital encounter of 11/05/13  ETHANOL      Result Value Range   Alcohol, Ethyl (B) <11  0 - 11 mg/dL  URINE RAPID DRUG SCREEN (HOSP PERFORMED)  Result Value Range   Opiates NONE DETECTED  NONE DETECTED   Cocaine NONE DETECTED  NONE DETECTED   Benzodiazepines NONE DETECTED  NONE DETECTED   Amphetamines NONE DETECTED  NONE DETECTED   Tetrahydrocannabinol POSITIVE (*) NONE DETECTED   Barbiturates NONE DETECTED  NONE DETECTED  TROPONIN I      Result Value Range   Troponin I <0.30  <0.30 ng/mL  D-DIMER, QUANTITATIVE      Result Value Range   D-Dimer, Quant <0.27  0.00 - 0.48 ug/mL-FEU  CBC WITH DIFFERENTIAL      Result Value Range   WBC 10.8 (*) 4.0 - 10.5 K/uL   RBC 4.33  3.87 - 5.11 MIL/uL   Hemoglobin 13.3  12.0 - 15.0 g/dL   HCT 96.0  45.4 - 09.8 %   MCV 86.4  78.0 - 100.0 fL   MCH  30.7  26.0 - 34.0 pg   MCHC 35.6  30.0 - 36.0 g/dL   RDW 11.9  14.7 - 82.9 %   Platelets 244  150 - 400 K/uL   Neutrophils Relative % 47  43 - 77 %   Neutro Abs 5.1  1.7 - 7.7 K/uL   Lymphocytes Relative 45  12 - 46 %   Lymphs Abs 4.9 (*) 0.7 - 4.0 K/uL   Monocytes Relative 7  3 - 12 %   Monocytes Absolute 0.8  0.1 - 1.0 K/uL   Eosinophils Relative 1  0 - 5 %   Eosinophils Absolute 0.1  0.0 - 0.7 K/uL   Basophils Relative 0  0 - 1 %   Basophils Absolute 0.0  0.0 - 0.1 K/uL  COMPREHENSIVE METABOLIC PANEL      Result Value Range   Sodium 139  135 - 145 mEq/L   Potassium 3.4 (*) 3.5 - 5.1 mEq/L   Chloride 100  96 - 112 mEq/L   CO2 26  19 - 32 mEq/L   Glucose, Bld 109 (*) 70 - 99 mg/dL   BUN 16  6 - 23 mg/dL   Creatinine, Ser 5.62 (*) 0.50 - 1.10 mg/dL   Calcium 9.9  8.4 - 13.0 mg/dL   Total Protein 8.2  6.0 - 8.3 g/dL   Albumin 4.3  3.5 - 5.2 g/dL   AST 40 (*) 0 - 37 U/L   ALT 30  0 - 35 U/L   Alkaline Phosphatase 47  39 - 117 U/L   Total Bilirubin 0.5  0.3 - 1.2 mg/dL   GFR calc non Af Amer 49 (*) >90 mL/min   GFR calc Af Amer 57 (*) >90 mL/min  URINALYSIS, ROUTINE W REFLEX MICROSCOPIC      Result Value Range   Color, Urine YELLOW  YELLOW   APPearance CLEAR  CLEAR   Specific Gravity, Urine 1.015  1.005 - 1.030   pH 5.5  5.0 - 8.0   Glucose, UA NEGATIVE  NEGATIVE mg/dL   Hgb urine dipstick NEGATIVE  NEGATIVE   Bilirubin Urine NEGATIVE  NEGATIVE   Ketones, ur NEGATIVE  NEGATIVE mg/dL   Protein, ur NEGATIVE  NEGATIVE mg/dL   Urobilinogen, UA 0.2  0.0 - 1.0 mg/dL   Nitrite NEGATIVE  NEGATIVE   Leukocytes, UA NEGATIVE  NEGATIVE   Laboratory interpretation all normal    Imaging Review  Dg Chest 2 View  11/05/2013   CLINICAL DATA:  Neck pain  EXAM: CHEST  2 VIEW  COMPARISON:  06/27/2013  FINDINGS: Lungs are clear.  No pleural effusion  or pneumothorax.  The heart is normal in size.  Mild degenerative changes of the visualized thoracolumbar spine.  IMPRESSION: No evidence  of acute cardiopulmonary disease.   Electronically Signed   By: Charline Bills M.D.   On: 11/05/2013 20:47   Dg Cervical Spine Complete  11/05/2013   CLINICAL DATA:  Left neck pain  EXAM: CERVICAL SPINE  4+ VIEWS  COMPARISON:  03/22/2008  FINDINGS: Small anterior endplate spurs C4-5 and C5-6. Vertebral body and disc height well maintained throughout. No prevertebral soft tissue swelling. Negative for fracture. No other significant osseous degenerative change.  IMPRESSION: 1. Negative for fracture or other acute cervical abnormality. 2. Early degenerative spurring C4-5, C5-6.   Electronically Signed   By: Oley Balm M.D.   On: 11/05/2013 20:41        EKG Interpretation    Date/Time:  Saturday November 05 2013 19:13:55 EST Ventricular Rate:  74 PR Interval:  184 QRS Duration: 90 QT Interval:  396 QTC Calculation: 439 R Axis:   73 Text Interpretation:  Normal sinus rhythm Normal ECG When compared with ECG of 27-Jun-2013 14:41, No significant change was found Confirmed by Maria Coin  MD-I, Jeanifer Halliday ([id number]) on 11/05/2013 8:17:05 PM            MDM   1. Muscular pain     New Prescriptions   CYCLOBENZAPRINE (FLEXERIL) 10 MG TABLET    Take 1 tablet (10 mg total) by mouth 3 (three) times daily as needed for muscle spasms.   NAPROXEN (NAPROSYN) 500 MG TABLET    Take 1 tablet (500 mg total) by mouth 2 (two) times daily.    Plan discharge   Devoria Albe, MD, Franz Dell, MD 11/05/13 [id number]

## 2014-05-12 ENCOUNTER — Encounter (HOSPITAL_COMMUNITY): Payer: Self-pay | Admitting: Emergency Medicine

## 2014-05-12 ENCOUNTER — Emergency Department (HOSPITAL_COMMUNITY)
Admission: EM | Admit: 2014-05-12 | Discharge: 2014-05-12 | Disposition: A | Discharge status: Home / Self Care | Payer: Medicaid Other | Attending: Emergency Medicine | Admitting: Emergency Medicine

## 2014-05-12 ENCOUNTER — Emergency Department (HOSPITAL_COMMUNITY): Payer: Medicaid Other

## 2014-05-12 DIAGNOSIS — G8929 Other chronic pain: Secondary | ICD-10-CM | POA: Insufficient documentation

## 2014-05-12 DIAGNOSIS — I2510 Atherosclerotic heart disease of native coronary artery without angina pectoris: Secondary | ICD-10-CM | POA: Insufficient documentation

## 2014-05-12 DIAGNOSIS — H409 Unspecified glaucoma: Secondary | ICD-10-CM | POA: Insufficient documentation

## 2014-05-12 DIAGNOSIS — I10 Essential (primary) hypertension: Secondary | ICD-10-CM | POA: Insufficient documentation

## 2014-05-12 DIAGNOSIS — J4489 Other specified chronic obstructive pulmonary disease: Secondary | ICD-10-CM | POA: Insufficient documentation

## 2014-05-12 DIAGNOSIS — Z8701 Personal history of pneumonia (recurrent): Secondary | ICD-10-CM | POA: Insufficient documentation

## 2014-05-12 DIAGNOSIS — Z7902 Long term (current) use of antithrombotics/antiplatelets: Secondary | ICD-10-CM | POA: Insufficient documentation

## 2014-05-12 DIAGNOSIS — M546 Pain in thoracic spine: Secondary | ICD-10-CM

## 2014-05-12 DIAGNOSIS — F17200 Nicotine dependence, unspecified, uncomplicated: Secondary | ICD-10-CM | POA: Insufficient documentation

## 2014-05-12 DIAGNOSIS — Z7982 Long term (current) use of aspirin: Secondary | ICD-10-CM | POA: Insufficient documentation

## 2014-05-12 DIAGNOSIS — M129 Arthropathy, unspecified: Secondary | ICD-10-CM | POA: Insufficient documentation

## 2014-05-12 DIAGNOSIS — Z9861 Coronary angioplasty status: Secondary | ICD-10-CM | POA: Insufficient documentation

## 2014-05-12 DIAGNOSIS — Z8619 Personal history of other infectious and parasitic diseases: Secondary | ICD-10-CM | POA: Insufficient documentation

## 2014-05-12 DIAGNOSIS — Z8659 Personal history of other mental and behavioral disorders: Secondary | ICD-10-CM | POA: Insufficient documentation

## 2014-05-12 DIAGNOSIS — Z79899 Other long term (current) drug therapy: Secondary | ICD-10-CM | POA: Insufficient documentation

## 2014-05-12 DIAGNOSIS — Z8673 Personal history of transient ischemic attack (TIA), and cerebral infarction without residual deficits: Secondary | ICD-10-CM | POA: Insufficient documentation

## 2014-05-12 DIAGNOSIS — J449 Chronic obstructive pulmonary disease, unspecified: Secondary | ICD-10-CM | POA: Insufficient documentation

## 2014-05-12 DIAGNOSIS — I252 Old myocardial infarction: Secondary | ICD-10-CM | POA: Insufficient documentation

## 2014-05-12 LAB — CBC WITH DIFFERENTIAL/PLATELET
Basophils Absolute: 0 10*3/uL (ref 0.0–0.1)
Basophils Relative: 0 % (ref 0–1)
EOS ABS: 0.1 10*3/uL (ref 0.0–0.7)
EOS PCT: 1 % (ref 0–5)
HEMATOCRIT: 33.8 % — AB (ref 36.0–46.0)
Hemoglobin: 11.9 g/dL — ABNORMAL LOW (ref 12.0–15.0)
LYMPHS ABS: 4.9 10*3/uL — AB (ref 0.7–4.0)
Lymphocytes Relative: 57 % — ABNORMAL HIGH (ref 12–46)
MCH: 30.2 pg (ref 26.0–34.0)
MCHC: 35.2 g/dL (ref 30.0–36.0)
MCV: 85.8 fL (ref 78.0–100.0)
MONO ABS: 0.6 10*3/uL (ref 0.1–1.0)
MONOS PCT: 7 % (ref 3–12)
Neutro Abs: 2.9 10*3/uL (ref 1.7–7.7)
Neutrophils Relative %: 35 % — ABNORMAL LOW (ref 43–77)
PLATELETS: 218 10*3/uL (ref 150–400)
RBC: 3.94 MIL/uL (ref 3.87–5.11)
RDW: 12.4 % (ref 11.5–15.5)
WBC: 8.5 10*3/uL (ref 4.0–10.5)

## 2014-05-12 LAB — URINALYSIS, ROUTINE W REFLEX MICROSCOPIC
Bilirubin Urine: NEGATIVE
GLUCOSE, UA: NEGATIVE mg/dL
HGB URINE DIPSTICK: NEGATIVE
KETONES UR: NEGATIVE mg/dL
LEUKOCYTES UA: NEGATIVE
Nitrite: NEGATIVE
PROTEIN: NEGATIVE mg/dL
Specific Gravity, Urine: <1.005 — ABNORMAL LOW (ref 1.005–1.030)
UROBILINOGEN UA: 1 mg/dL (ref 0.0–1.0)
pH: 7 (ref 5.0–8.0)

## 2014-05-12 LAB — COMPREHENSIVE METABOLIC PANEL
ALT: 18 U/L (ref 0–35)
AST: 25 U/L (ref 0–37)
Albumin: 3.8 g/dL (ref 3.5–5.2)
Alkaline Phosphatase: 46 U/L (ref 39–117)
BUN: 14 mg/dL (ref 6–23)
CALCIUM: 9.1 mg/dL (ref 8.4–10.5)
CO2: 28 mEq/L (ref 19–32)
Chloride: 103 mEq/L (ref 96–112)
Creatinine, Ser: 0.74 mg/dL (ref 0.50–1.10)
GLUCOSE: 95 mg/dL (ref 70–99)
Potassium: 3.1 mEq/L — ABNORMAL LOW (ref 3.7–5.3)
SODIUM: 143 meq/L (ref 137–147)
Total Bilirubin: 0.2 mg/dL — ABNORMAL LOW (ref 0.3–1.2)
Total Protein: 7.3 g/dL (ref 6.0–8.3)

## 2014-05-12 LAB — CBG MONITORING, ED: Glucose-Capillary: 93 mg/dL (ref 70–99)

## 2014-05-12 LAB — TROPONIN I: Troponin I: <0.3 ng/mL (ref <0.30)

## 2014-05-12 MED ORDER — KETOROLAC TROMETHAMINE 30 MG/ML IJ SOLN
30.0000 mg | Freq: Once | INTRAMUSCULAR | Status: AC
  Administered 2014-05-12: 30 mg via INTRAVENOUS
  Filled 2014-05-12: qty 1

## 2014-05-12 MED ORDER — TRAMADOL HCL 50 MG PO TABS: 50.0000 mg | ORAL_TABLET | Freq: Four times a day (QID) | ORAL | Status: DC | PRN

## 2014-05-12 NOTE — ED Notes (Signed)
Pain upper back and lt arm, intermittently for 2 weeks,

## 2014-05-12 NOTE — ED Provider Notes (Signed)
CSN: [id number]     Arrival date & time 05/12/14  1757 History   First MD Initiated Contact with Patient 05/12/14 1801     Chief Complaint  Patient presents with  . Chest Pain     (Consider location/radiation/quality/duration/timing/severity/associated sxs/prior Treatment) HPI Patient presents with concern of ongoing left para scapular pain. Pain has been present for 2 weeks, waxing and/waning, occurring without clear precipitant. Initially the patient denies other focal complaints, but in the review of systems, she answers in the affirmative to assist all questions, including arm pain, chest pain, abdominal pain, headache, lightheadedness, dyspnea, nausea.  Patient continues to  smoke denies alcohol abuse. Patient's had evaluation for similar pain in the past.   Past Medical History  Diagnosis Date  . Adjustment disorder with depressed mood 01/11/2009  . CAD 07/10/2008  . COCAINE DEPENDENCY NOS 01/14/2007  . CVA 01/14/2007  . HEPATITIS C 02/26/2009  . HYPERTENSION, BENIGN SYSTEMIC 01/14/2007  . TOBACCO DEPENDENCE 01/14/2007  . MI (myocardial infarction) 12/20/2007    had stent placed  . Coronary artery disease 01/14/2012  . Asthma   . COPD (chronic obstructive pulmonary disease)   . PTSD (post-traumatic stress disorder)   . Chronic back pain   . Sciatica   . Stroke   . Family history of anesthesia complication 1517    Pts sister died after having anesthesia  . PTSD (post-traumatic stress disorder)   . Right sided weakness     d/t stroke  . Memory loss     d/t CVA  . CONVULSIONS, SEIZURES, NOS 01/14/2007    last seizure was July 2014.  . Glaucoma of both eyes   . Pneumonia     hx of  . Arthritis   . Hyperlipemia   . OHYWVPXT(062.6)    Past Surgical History  Procedure Laterality Date  . Cesarean section      x 2  . Coronary angioplasty with stent placement  2009    1 stent  . Colonoscopy w/ polypectomy    . Tonsillectomy    . Trabeculectomy Left 06/29/2013   Procedure: TRABECULECTOMY WITH MYTOMICIN C LEFT EYE;  Surgeon: Marylynn Pearson, MD;  Location: [place];  Service: Ophthalmology;  Laterality: Left;  . Trabeculectomy Right 09/07/2013    Procedure: TRABECULECTOMY RIGHT EYE;  Surgeon: Marylynn Pearson, MD;  Location: [place];  Service: Ophthalmology;  Laterality: Right;  . Mitomycin c application Right 94/85/4627    Procedure: MITOMYCIN C APPLICATION RIGHT EYE;  Surgeon: Marylynn Pearson, MD;  Location: [place];  Service: Ophthalmology;  Laterality: Right;   Family History  Problem Relation Age of Onset  . Stroke Mother   . Liver cancer Father     ??? with alcohol abuse  . Colon cancer Paternal Uncle   . Heart disease Sister   . Colon cancer Paternal Aunt    History  Substance Use Topics  . Smoking status: Current Every Day Smoker -- 0.50 packs/day    Types: Cigarettes    Last Attempt to Quit: 12/03/2011  . Smokeless tobacco: Never Used  . Alcohol Use: No     Comment: quit 4 years ago   OB History   Grav Para Term Preterm Abortions TAB SAB Ect Mult Living                 Review of Systems  Constitutional:       Per HPI, otherwise negative  HENT:       Per HPI, otherwise negative  Respiratory:  Per HPI, otherwise negative  Cardiovascular:       Per HPI, otherwise negative  Gastrointestinal: Negative for vomiting.  Endocrine:       Negative aside from HPI  Genitourinary:       Neg aside from HPI   Musculoskeletal:       Per HPI, otherwise negative  Skin: Negative.   Neurological: Negative for syncope.      Allergies  Review of patient's allergies indicates no known allergies.  Home Medications   Prior to Admission medications   Medication Sig Start Date End Date Taking? Authorizing Provider  albuterol (PROVENTIL HFA;VENTOLIN HFA) 108 (90 BASE) MCG/ACT inhaler Inhale 2 puffs into the lungs every 6 (six) hours as needed for wheezing. For wheezing   Yes Historical Provider, MD  amLODipine (NORVASC) 5 MG tablet Take 5 mg by  mouth every morning.    Yes Historical Provider, MD  aspirin 81 MG EC tablet Take 81 mg by mouth daily.     Yes Historical Provider, MD  atorvastatin (LIPITOR) 40 MG tablet Take 40 mg by mouth every morning.    Yes Historical Provider, MD  carbamazepine (TEGRETOL) 200 MG tablet Take 200 mg by mouth 2 (two) times daily.     Yes Historical Provider, MD  carvedilol (COREG) 12.5 MG tablet Take 12.5 mg by mouth 2 (two) times daily with a meal.   Yes Historical Provider, MD  clopidogrel (PLAVIX) 75 MG tablet Take 75 mg by mouth every morning.    Yes Historical Provider, MD  lisinopril-hydrochlorothiazide (PRINZIDE,ZESTORETIC) 20-12.5 MG per tablet Take 1 tablet by mouth every morning.   Yes Historical Provider, MD  nitroGLYCERIN (NITROSTAT) 0.4 MG SL tablet Place 0.4 mg under the tongue every 5 (five) minutes as needed. x3 doses as needed for chest pain    Historical Provider, MD   BP 177/84  Pulse 61  Temp(Src) 99 F (37.2 C) (Oral)  Resp 19  SpO2 100% Physical Exam  Nursing note and vitals reviewed. Constitutional: She is oriented to person, place, and time. She appears well-developed and well-nourished. No distress.  HENT:  Head: Normocephalic and atraumatic.  Eyes: Conjunctivae and EOM are normal.  Cardiovascular: Normal rate and regular rhythm.   Pulmonary/Chest: Effort normal and breath sounds normal. No stridor. No respiratory distress.  Abdominal: She exhibits no distension.  Musculoskeletal: She exhibits no edema.       Arms: Neurological: She is alert and oriented to person, place, and time. No cranial nerve deficit.  Skin: Skin is warm and dry.  Psychiatric: She has a normal mood and affect.    ED Course  Procedures (including critical care time) Labs Review Labs Reviewed  CBC WITH DIFFERENTIAL - Abnormal; Notable for the following:    Hemoglobin 11.9 (*)    HCT 33.8 (*)    Neutrophils Relative % 35 (*)    Lymphocytes Relative 57 (*)    Lymphs Abs 4.9 (*)    All other  components within normal limits  COMPREHENSIVE METABOLIC PANEL - Abnormal; Notable for the following:    Potassium 3.1 (*)    Total Bilirubin 0.2 (*)    All other components within normal limits  URINALYSIS, ROUTINE W REFLEX MICROSCOPIC - Abnormal; Notable for the following:    Specific Gravity, Urine <1.005 (*)    All other components within normal limits  TROPONIN I  CBG MONITORING, ED    Imaging Review Dg Chest 2 View  05/12/2014   CLINICAL DATA:  Left dorsal chest pain  for 2 days.  EXAM: CHEST  2 VIEW  COMPARISON:  Chest radiograph November 05, 2013  FINDINGS: The heart size and mediastinal contours are within normal limits. Both lungs are clear. The visualized skeletal structures are nonsuspicious, mild degenerative change of the thoracic spine. Multiple EKG lines overlie the patient and may obscure subtle underlying pathology.  IMPRESSION: No active cardiopulmonary disease.   Electronically Signed   By: Elon Alas   On: 05/12/2014 19:07     EKG Interpretation   Date/Time:  Friday May 12 2014 18:33:58 EDT Ventricular Rate:  65 PR Interval:  212 QRS Duration: 99 QT Interval:  427 QTC Calculation: 444 R Axis:   67 Text Interpretation:  Sinus rhythm Prolonged PR interval Sinus rhythm  Artifact Borderline ECG Confirmed by Carmin Muskrat  MD ([id number]) on  05/12/2014 6:37:47 PM     On repeat exam the patient is pain-free.  MDM  Patient presents with ongoing back pain, possibly chest pain, arm pain. Patient's evaluation here is largely reassuring, her blood pressure decreases substantially, spontaneously, and she was discharged in stable condition. With reassuring findings, and prior evaluations for similar back pain, there is low suspicion for occult ACS or other systemic pathology.    Carmin Muskrat, MD 05/12/14 2050

## 2014-05-12 NOTE — ED Notes (Signed)
12 Lead EKG done upon pt's arrival, difficulty with transmitting EKG via monitor.

## 2014-05-12 NOTE — Discharge Instructions (Signed)
As discussed, your evaluation today has been largely reassuring.  But, it is important that you monitor your condition carefully, and do not hesitate to return to the ED if you develop new, or concerning changes in your condition. ? ?Otherwise, please follow-up with your physician for appropriate ongoing care. ? ?

## 2014-05-30 ENCOUNTER — Ambulatory Visit (INDEPENDENT_AMBULATORY_CARE_PROVIDER_SITE_OTHER): Payer: Medicaid Other | Admitting: Family Medicine

## 2014-05-30 ENCOUNTER — Encounter: Payer: Self-pay | Admitting: Family Medicine

## 2014-05-30 VITALS — BP 146/82 | HR 64 | Temp 98.2°F | Ht 67.5 in | Wt 149.9 lb

## 2014-05-30 DIAGNOSIS — Z0000 Encounter for general adult medical examination without abnormal findings: Secondary | ICD-10-CM

## 2014-05-30 DIAGNOSIS — B1710 Acute hepatitis C without hepatic coma: Secondary | ICD-10-CM

## 2014-05-30 DIAGNOSIS — I10 Essential (primary) hypertension: Secondary | ICD-10-CM

## 2014-05-30 DIAGNOSIS — E875 Hyperkalemia: Secondary | ICD-10-CM

## 2014-05-30 DIAGNOSIS — M546 Pain in thoracic spine: Secondary | ICD-10-CM

## 2014-05-30 DIAGNOSIS — M549 Dorsalgia, unspecified: Secondary | ICD-10-CM | POA: Insufficient documentation

## 2014-05-30 MED ORDER — POTASSIUM CHLORIDE CRYS ER 20 MEQ PO TBCR: 20.0000 meq | EXTENDED_RELEASE_TABLET | Freq: Every day | ORAL | Status: DC

## 2014-05-30 NOTE — Progress Notes (Signed)
   Subjective:    Patient ID: [name], female    DOB: 12-16-1955, 58 y.o.   MRN: [id number]  HPI [name] is a 58 y.o. African American female presents to same-day clinic.  Hospital followup for chest/back pain: Patient states she was seen in the emergency room for chest/back pain. She has not had any repeat symptoms since her her visit. At that time her EKG was normal, with the exception of elongated PR segment. Chest x-ray was normal. Lab results were basically normal with the exception of a low potassium of 3.1. Patient states she doesn't believe she was treated for low potassium. She is a patient of Dr. Terrence Dupont and follows up regularly with him, next appointment in August. She denies chest pain, shortness of breath or palpitations. She states she has been taking her medications as prescribed. Patient is on aspirin, Lipitor, Coreg, Plavix, lisinopril HCTZ and amlodipine. She has been taking tramadol for her "back pain "and it seems to be helping her.  Hepatitis C: Patient has not been seen at this clinic for some time, and is wondering if she needs to be getting anything else for her hepatitis C. She used to be followed at the hep C clinic on [facility] has closed, she hasn't had any followup since. Patient is HIV negative. Patient denies any fatigue or abdominal pain at this time.  Health maintenance: Patient has never had a mammogram. She had a colonoscopy in 2013, with recommendations to your return. She does not appear she had a Pap smear since 2006. She will be due for a tetanus vaccination next year. She currently smokes daily.    Review of Systems Per history of present illness    Objective:   Physical Exam BP 146/82  Pulse 64  Temp(Src) 98.2 F (36.8 C) (Oral)  Ht 5' 7.5" (1.715 m)  Wt 149 lb 14.4 oz (67.994 kg)  BMI 23.12 kg/m2 Gen: Pleasant African American female, well-developed, well-nourished. Thin. No acute distress nontoxic in appearance HEENT: AT. [place].  Bilateral eyes with injections, no icterus. MMM.  CV: RRR  Chest: CTAB, no wheeze or crackles Abd: Soft. NTND. BS present. No Masses palpated.  Ext: No erythema. No edema.  Skin: No rashes, purpura or petechiae.

## 2014-05-30 NOTE — Assessment & Plan Note (Signed)
ID referral today Completed HCV RNA quantitative lab

## 2014-05-30 NOTE — Assessment & Plan Note (Addendum)
Patient will need Pap smear for a future appointment. Patient encouraged to have mammogram completed, paperwork given today. Colonoscopy is up-to-date, with ten-year interval recommended. Lipids, CBC, BMP and hep C quantitative. Pneumonia vaccination given in 2013. DEXA scan will need to be ordered on next appointment.

## 2014-05-30 NOTE — Patient Instructions (Signed)
I would like you to schedule mammogram for yourself. Contact information is given to you today to do that. I am going to perform some basic lab work and hepatitis C lab work. I will give you information to infectious disease and he will be calling you to set up appointment. I will call in a prescription for potassium. I would like you to take this ever the next 5 days. Followup in 2 months.

## 2014-05-30 NOTE — Assessment & Plan Note (Signed)
Patient here for hospital followup to back pain/chest pain. Lead to be musculoskeletal in nature during ED visit. Continue tramadol and heating pad for relief.

## 2014-05-30 NOTE — Assessment & Plan Note (Signed)
Hyperkalemia on labs during ED 3.1. Will treat with K-Dur 20 mg daily for 5 days. Patient to return for followup physical and yearly labs. We'll recheck him at that time.

## 2014-05-31 ENCOUNTER — Encounter: Payer: Self-pay | Admitting: Family Medicine

## 2014-05-31 LAB — COMPLETE METABOLIC PANEL WITH GFR
ALK PHOS: 43 U/L (ref 39–117)
ALT: 18 U/L (ref 0–35)
AST: 25 U/L (ref 0–37)
Albumin: 4.2 g/dL (ref 3.5–5.2)
BILIRUBIN TOTAL: 0.5 mg/dL (ref 0.2–1.2)
BUN: 12 mg/dL (ref 6–23)
CO2: 27 mEq/L (ref 19–32)
CREATININE: 0.83 mg/dL (ref 0.50–1.10)
Calcium: 9.3 mg/dL (ref 8.4–10.5)
Chloride: 105 mEq/L (ref 96–112)
GFR, Est African American: 89 mL/min
GFR, Est Non African American: 77 mL/min
Glucose, Bld: 90 mg/dL (ref 70–99)
Potassium: 3.9 mEq/L (ref 3.5–5.3)
Sodium: 140 mEq/L (ref 135–145)
Total Protein: 6.8 g/dL (ref 6.0–8.3)

## 2014-05-31 LAB — LIPID PANEL
CHOL/HDL RATIO: 3.3 ratio
CHOLESTEROL: 199 mg/dL (ref 0–200)
HDL: 60 mg/dL (ref >39)
LDL Cholesterol: 113 mg/dL — ABNORMAL HIGH (ref 0–99)
TRIGLYCERIDES: 131 mg/dL (ref <150)
VLDL: 26 mg/dL (ref 0–40)

## 2014-06-01 LAB — HEPATITIS C RNA QUANTITATIVE
HCV Quantitative Log: 5.74 {Log} — ABNORMAL HIGH (ref <1.18)
HCV Quantitative: 544848 IU/mL — ABNORMAL HIGH (ref <15)

## 2014-06-02 ENCOUNTER — Encounter: Payer: Self-pay | Admitting: Family Medicine

## 2014-06-07 ENCOUNTER — Other Ambulatory Visit: Payer: Medicaid Other

## 2014-06-15 ENCOUNTER — Other Ambulatory Visit (INDEPENDENT_AMBULATORY_CARE_PROVIDER_SITE_OTHER): Payer: Medicaid Other

## 2014-06-15 DIAGNOSIS — B182 Chronic viral hepatitis C: Secondary | ICD-10-CM

## 2014-06-15 LAB — IRON: Iron: 94 ug/dL (ref 42–145)

## 2014-06-16 LAB — PROTIME-INR
INR: 1.13 (ref <1.50)
Prothrombin Time: 14.5 seconds (ref 11.6–15.2)

## 2014-06-16 LAB — HIV ANTIBODY (ROUTINE TESTING W REFLEX): HIV: NONREACTIVE

## 2014-06-16 LAB — HEPATITIS A ANTIBODY, TOTAL: HEP A TOTAL AB: NONREACTIVE

## 2014-06-16 LAB — HEPATITIS B CORE ANTIBODY, TOTAL: HEP B C TOTAL AB: REACTIVE — AB

## 2014-06-16 LAB — ANA: Anti Nuclear Antibody(ANA): NEGATIVE

## 2014-06-16 LAB — HEPATITIS B SURFACE ANTIGEN: HEP B S AG: NEGATIVE

## 2014-06-16 LAB — HEPATITIS B SURFACE ANTIBODY,QUALITATIVE: HEP B S AB: NEGATIVE

## 2014-06-19 LAB — HEPATITIS C GENOTYPE

## 2014-07-25 ENCOUNTER — Telehealth: Payer: Self-pay | Admitting: *Deleted

## 2014-07-25 ENCOUNTER — Ambulatory Visit (INDEPENDENT_AMBULATORY_CARE_PROVIDER_SITE_OTHER): Payer: Medicaid Other | Admitting: Internal Medicine

## 2014-07-25 ENCOUNTER — Encounter: Payer: Self-pay | Admitting: Internal Medicine

## 2014-07-25 VITALS — BP 177/84 | HR 76 | Temp 97.8°F | Wt 156.0 lb

## 2014-07-25 DIAGNOSIS — Z23 Encounter for immunization: Secondary | ICD-10-CM

## 2014-07-25 DIAGNOSIS — B182 Chronic viral hepatitis C: Secondary | ICD-10-CM | POA: Insufficient documentation

## 2014-07-25 NOTE — Addendum Note (Signed)
Addended by: Myrtis Hopping A on: 07/25/2014 09:33 AM   Modules accepted: Orders

## 2014-07-25 NOTE — Progress Notes (Signed)
+[name] is a 58 y.o. female who presents for initial evaluation and management of a positive Hepatitis C antibody test.  Patient tested positive 6 years ago. Test was performed as part of an evaluation of unknown to patient. Hepatitis C risk factors present are: IV drug abuse (details: no drugs over 12 years). Patient denies multiple sexual partners, renal dialysis, sexual contact with person with liver disease, tattoos. Patient has had other studies performed. Results: hepatitis C RNA by PCR, result: positive. Patient has not had prior treatment for Hepatitis C. Patient does not have a past history of liver disease. Patient does have a family history of liver disease - father died of liver cancer, unknown etiology.    HPI: She is interested in treatement.  Had a liver biopsy 6 years ago in [place], unknown fibrosis score.  Was not offered treatment.   Patient does not have documented immunity to Hepatitis A. Patient does have documented immunity to Hepatitis B.     Review of Systems A comprehensive review of systems was negative.   Past Medical History  Diagnosis Date  . Adjustment disorder with depressed mood 01/11/2009  . CAD 07/10/2008  . COCAINE DEPENDENCY NOS 01/14/2007  . CVA 01/14/2007  . HEPATITIS C 02/26/2009  . HYPERTENSION, BENIGN SYSTEMIC 01/14/2007  . TOBACCO DEPENDENCE 01/14/2007  . MI (myocardial infarction) 12/20/2007    had stent placed  . Coronary artery disease 01/14/2012  . Asthma   . COPD (chronic obstructive pulmonary disease)   . PTSD (post-traumatic stress disorder)   . Chronic back pain   . Sciatica   . Stroke   . Family history of anesthesia complication 8115    Pts sister died after having anesthesia  . PTSD (post-traumatic stress disorder)   . Right sided weakness     d/t stroke  . Memory loss     d/t CVA  . CONVULSIONS, SEIZURES, NOS 01/14/2007    last seizure was July 2014.  . Glaucoma of both eyes   . Pneumonia     hx of  . Arthritis   .  Hyperlipemia   . BWIOMBTD(974.1)     History  Substance Use Topics  . Smoking status: Current Every Day Smoker -- 0.50 packs/day    Types: Cigarettes  . Smokeless tobacco: Never Used  . Alcohol Use: No     Comment: quit 4 years ago    Family History  Problem Relation Age of Onset  . Stroke Mother   . Liver cancer Father     ??? with alcohol abuse  . Colon cancer Paternal Uncle   . Heart disease Sister   . Colon cancer Paternal Aunt       Objective:   Filed Vitals:   07/25/14 0841  BP: 177/84  Pulse: 76  Temp: 97.8 F (36.6 C)   in no apparent distress and alert HEENT: anicteric Cor RRR and No murmurs clear Bowel sounds are normal, liver is not enlarged, spleen is not enlarged peripheral pulses normal, no pedal edema, no clubbing or cyanosis negative for - jaundice, spider hemangioma, telangiectasia, palmar erythema, ecchymosis and atrophy  Laboratory Genotype:  Lab Results  Component Value Date   HCVGENOTYPE 1b 06/15/2014   HCV viral load:  Lab Results  Component Value Date   HCVQUANT 544848* 05/30/2014   Lab Results  Component Value Date   WBC 8.5 05/12/2014   HGB 11.9* 05/12/2014   HCT 33.8* 05/12/2014   MCV 85.8 05/12/2014   PLT 218 05/12/2014  Lab Results  Component Value Date   CREATININE 0.83 05/30/2014   BUN 12 05/30/2014   NA 140 05/30/2014   K 3.9 05/30/2014   CL 105 05/30/2014   CO2 27 05/30/2014    Lab Results  Component Value Date   ALT 18 05/30/2014   AST 25 05/30/2014   ALKPHOS 43 05/30/2014   BILITOT 0.5 05/30/2014   INR 1.13 06/15/2014      Assessment: Hepatitis C genotype 1b  Plan: 1) Patient counseled extensively on limiting acetaminophen to no more than 2 grams daily, avoidance of alcohol. 2) Transmission discussed with patient including sexual transmission, sharing razors and toothbrush.   3) Will need referral to gastroenterology: No., unless indicated 4) Will need referral for substance abuse counseling: No. 5) Will prescribe  Harvoni for 12 weeks once work up complete with elastography.  Return after starting treatment or in 1 year.  6) Hepatitis A vaccine Yes.   7) Hepatitis B vaccine No. 8) Pneumovax vaccine No. unless cirrhosis.   9) medication interactions - she will need to stop Tegretol if/when she starts Harvoni.  I will let her PCP know if she qualifies for treatment (F2 or greater on elastography).

## 2014-07-25 NOTE — Patient Instructions (Signed)
Date 07/25/14  Dear Ms [name], As discussed in the [facility], your hepatitis C therapy will include the following medications:           Harvoni 90mg /400mg  tablet:           Take 1 tablet by mouth once daily   Please note that ALL MEDICATIONS WILL START ON THE SAME DATE for a total of 12 weeks. ---------------------------------------------------------------- Your HCV Treatment Start Date: TBA   Your HCV genotype:  1b    Liver Fibrosis:   TBD  ---------------------------------------------------------------- YOUR PHARMACY CONTACT:   [facility] and [place] Phone: [phone] Hours: Monday to Friday 7:30 am to 6:00 pm   Please always contact your pharmacy at least 3-4 business days before you run out of medications to ensure your next month's medication is ready or 1 week prior to running out if you receive it by mail.  Remember, each prescription is for 28 days. ---------------------------------------------------------------- GENERAL NOTES REGARDING YOUR HEPATITIS C MEDICATIONS:   SOFOSBUVIR/LEDIPASVIR (HARVONI): - Harvoni tablet is taken daily with OR without food. - The tablets are orange. - The tablets should be stored at room temperature.  - Acid reducing agents such as H2 blockers (ie. Pepcid (famotidine), Zantac (ranitidine), Tagamet (cimetidine), Axid (nizatidine) and proton pump inhibitors (ie. Prilosec (omeprazole), Protonix (pantoprazole), Nexium (esomeprazole), or Aciphex (rabeprazole)) can decrease effectiveness of Harvoni. Do not take until you have discussed with a health care provider.    -Antacids that contain magnesium and/or aluminum hydroxide (ie. Milk of Magensia, Rolaids, Gaviscon, Maalox, Mylanta, an dArthritis Pain Formula)can reduce absorption of Harvoni, so take them at least 4 hours before or after Harvoni.  -Calcium carbonate (calcium supplements or antacids such as Tums, Caltrate,  Os-Cal)needs to be taken at least 4 hours hours before or after Harvoni.  -St. John's wort or any products that contain St. John's wort like some herbal supplements  Please inform the office prior to starting any of these medications.  - The common side effects with Harvoni:      1. Fatigue      2. Headache      3. Nausea      4. Diarrhea      5. Insomnia   Support Path is a suite of resources designed to help patients start with HARVONI and move toward treatment completion [place] helps patients access therapy and get off to an efficient start  Benefits investigation and prior authorization support Co-pay and other financial assistance A specialty pharmacy finder CO-PAY COUPON The [place] co-pay coupon may help eligible patients lower their out-of-pocket costs. With a co-pay coupon, most eligible patients may pay no more than $5 per co-pay (restrictions apply) www.harvoni.com call 254-209-2819 Not valid for patients enrolled in government healthcare prescription drug programs, such as Medicare Part D and Medicaid. Patients in the coverage gap known as the "donut hole" also are not eligible The HARVONI co-pay coupon program will cover the out-of-pocket costs for HARVONI prescriptions up to a maximum of 25% of the catalog price of a 12-week regimen of HARVONI  Please note that this only lists the most common side effects and is NOT a comprehensive list of the potential side effects of these medications. For more information, please review the drug information sheets that come with your medication package from the pharmacy.  ---------------------------------------------------------------- GENERAL HELPFUL HINTS ON HCV THERAPY: 1. No alcohol. 2. Protect against sun-sensitivity/sunburns (wear sunglasses, hat, long  sleeves, pants and sunscreen). 3. Stay well-hydrated/well-moisturized. 4. Notify the [facility] of any changes in your other over-the-counter/herbal or  prescription medications. 5. If you miss a dose of your medication, take the missed dose as soon as you remember. Return to your regular time/dose schedule the next day.  6.  Do not stop taking your medications without first talking with your healthcare provider. 7.  You may take Tylenol (acetaminophen), as long as the dose is less than 2000 mg (OR no more than 4 tablets of the Tylenol Extra Strengths 500mg  tablet) in 24 hours. 8.  You will need to obtain routine labs and/or office visits at [facility] at weeks 2, 4, 8,  and 12 as well as 12 and 24 weeks after completion of treatment.  If you are not compliant with labs and office visits, we may discontinue HCV therapy.  Scharlene Gloss, [place] for [place] [address] [place], [place]  [id number] [phone]

## 2014-07-25 NOTE — Telephone Encounter (Signed)
Called patient and notified her of her ultrasound appt scheduled at [facility] for Wednesday 08/02/14 at 8:45 AM. She understands nothing to eat or drink after midnight.  Ultrasound has been priorauthorized

## 2014-08-02 ENCOUNTER — Ambulatory Visit (HOSPITAL_COMMUNITY)
Admission: RE | Admit: 2014-08-02 | Discharge: 2014-08-02 | Disposition: A | Discharge status: Home / Self Care | Payer: Medicaid Other | Source: Ambulatory Visit | Attending: Internal Medicine | Admitting: Internal Medicine

## 2014-08-02 DIAGNOSIS — B182 Chronic viral hepatitis C: Secondary | ICD-10-CM | POA: Insufficient documentation

## 2014-08-08 ENCOUNTER — Other Ambulatory Visit: Payer: Self-pay | Admitting: Internal Medicine

## 2014-08-08 MED ORDER — LEDIPASVIR-SOFOSBUVIR 90-400 MG PO TABS: 1.0000 | ORAL_TABLET | Freq: Every day | ORAL | Status: DC

## 2014-11-14 ENCOUNTER — Telehealth: Payer: Self-pay | Admitting: *Deleted

## 2014-11-14 NOTE — Telephone Encounter (Signed)
Patient called regarding the next step in her Hep C treatment. Spoke with Inez Catalina at [facility] and she can proceed with the prior authorization however per MD note patient can not take tegretol with the Harvoni. Patient states that she does not take tegretol daily "only when my neck starts twitching". Asked who prescribes this medication and she said Dr. Margo Aye, I do not see that it was ever prescribed in [facility] and she last saw Dr. Althia Forts in 2012. Please advise

## 2014-11-20 NOTE — Telephone Encounter (Signed)
[name] at [facility] is proceeding with prior authorization. Once it is approved patient will be notified that she can not take tegretol while on the [place].

## 2014-11-20 NOTE — Telephone Encounter (Signed)
I sent the prescription to [facility] outpatient pharmacy in September.  I am not sure of where the PA stands.  Sounds like she can just hold the Tegretol.  thanks

## 2014-12-06 ENCOUNTER — Ambulatory Visit: Payer: Medicaid Other | Admitting: Family Medicine

## 2015-02-12 ENCOUNTER — Telehealth: Payer: Self-pay | Admitting: *Deleted

## 2015-02-12 NOTE — Telephone Encounter (Signed)
Patient called because she was waiting on Harvoni to be approved and she has not gotten a call since 10/2014. Advised the patient will have someone call her about the issue as I can not tell where we are in the process.

## 2015-02-12 NOTE — Telephone Encounter (Signed)
She is <F2 so Medicaid denied her. I called her to let her know this.

## 2015-02-13 NOTE — Telephone Encounter (Signed)
Thank you for following up on this [place].

## 2015-07-05 ENCOUNTER — Other Ambulatory Visit: Payer: Self-pay | Admitting: Ophthalmology

## 2015-07-05 MED ORDER — TETRACAINE HCL 0.5 % OP SOLN: 1.0000 [drp] | OPHTHALMIC | Status: AC

## 2015-07-05 MED ORDER — MITOMYCIN-C INJECTION USE IN OR ONLY (0.4 MG/ML)
0.5000 mL | Freq: Once | INTRAVENOUS | Status: AC
Start: 2015-07-05 — End: ?

## 2015-07-05 NOTE — H&P (Signed)
History & Physical:   DATE:   06-14-15  NAME:  [name], [name]     [id number]       HISTORY OF PRESENT ILLNESS: Chief Eye Complaints    Patient states Va comes and goes. Patient c/o OD>OS running water all the time. Patient is only using OTC gtts.    patient  missed her last appointment & her phone was broken   HPI: EYES: Reports symptoms of " Va worse OD since I broke my glasses" interfering with all activities              ACTIVE PROBLEMS: Total mature senile cataract   ICD10: H25.89  ICD9: 366.17  Onset: 06/14/2015 11:44  Initial Date:    Glaucoma, chronic angle-closure   ICD10: H40.2290  ICD9: 365.23  Onset:   Initial Date:     mixed mechanism  uncontrolled OD   Hypertensive retinopathy   ICD10: H35.039  ICD9: 362.11  Onset:   Initial Date:    Nuclear cataract NOS   ICD10:   ICD9: 366.04  Onset:   Initial Date:    Asthma - intrinsic, stable   ICD10: J45.909  ICD9: 493.10  Onset:   Initial Date:  SURGERIES: Trabeculectomy w/[facility] OD 09/07/13 Trabeculectomy w/[facility] OS 06/29/2013  PERIPHERAL  IRIDECTOMY   OD 02/04/2013  PERIPHERAL  IRIDECTOMY   OU 11/30/2012 Pick List - Surgeries  C-Section x2  MEDICATIONS: Zacarias Pontes: 0.3% suspension SIG-  1 gtt in right eye once a day    Ciloxan (Ciprofloxacin) Solution: 0.3% solution SIG-  1 drop in right eye once daily   Artificial Tears Plus: Strength-   SIG-  Dose-  Freq-  REVIEW OF SYSTEMS: ROS:   GEN- Constitutional: HENT: GEN - Endocrine: Reports symptoms of LUNGS/Respiratory:  asthma HEART/Cardiovascular: Reports symptoms of hypertension.      HTN ABD/Gastrointestinal:  Musculoskeletal (BJE): NEURO/Neurological: Stroke  PSYCH/Psychiatric:    Is the pt oriented to time, place, person? yes Mood  normal     TOBACCO: Never smoker   ICD10: Z87.898 ICD9: V13.89   No exposure to tobacco.  FAMILY HISTORY: Positive family history for  -   Glaucoma: Glaucoma  /HTN Negative family history for  -   PARENTS: Glaucoma:;    Hypertension:   Father Glaucoma   Mother HTN: SIBLINGS:  Sister HTN UNCLES/AUNTS:  2-3 aunts Glaucoma OTHERS/DISTANT:    Family History - 1st Degree Relatives:  Mother dead.   Family History - 1st Degree Relatives:   Family History - 1st Degree Relatives:  Father is dead.  ALLERGIES: no beta blockers Drug Allergies.  No Known.  PHYSICAL EXAMINATION: Exam: GENERAL: Appearance: General appearance can be described as well-nourished, well-developed, and in no acute distress.        VS: BMI: 26.0.  BP: 160/84.  H: 66.00 in.  P: 76 /min.  RR: 16 /min.  W: 161lbs 0oz.    Va  OD:[place] HM  PH 20/NI  OS:[place] 20/50+1 PH20/NI  EYEGLASSES: broken  OD:+1.00 +0.50 x107                                           OS:+1.00 +0.25 x106 ADD:+2.75  MR:  06/14/2015 09:51  OD:+1.00 +0.50 x108   NI OS  +1.50 sph   20/30- ADD:  +  2.25  K's07/28/2016 11:56  OD:  40.25    axis 038    41.75     axis 128 OS:  41.50    axis 133    42.00    axis 043  VF:   OD: decrease vision in all four quadrants OS: full in all four quadrants  Motility: Full OU  PUPILS: 89mm OU not detected MG   EYELIDS & OCULAR ADNEXA:  normal    SLE: Conjunctiva: OD: elevated  low bleb - leak OU OS: elevated  bleb - leak     Cornea:  Arcus  K-spindle OU, endothelial pigment OU    Anterior Chamber: NW:GNFA w/ trace flare   OZ:HYQM and quiet    [place]: PERIPHERAL  IRIDECTOMY open   OU  Lens:  + synechiae OU, mature cataract OD  OS 1-2  nuclear  sclerosis    Ta   in mmHg:                       OD:   20  after dpx5 14                 OS:  17,18 Time07/28/2016 10:52   Gonio: OD:Sup and Inf. TM nasal internal sclerostomy  deep ? patency  VH:QIONG open to TM w/squeezing OS  Dilation:  trop 1%  phenylephrine 2.5% 06/14/2015 10:53    Fundus:  optic nerve:   EX:BMWUXL 85-90% cup                                             OS:90% cup inf. rim loss   Macula:      poor view   Vessels:Silver wire   OU  Periphery:  flat    Visual Fields  severe constriction OU  Exam: GENERAL: Appearance: HEAD, EARS, NOSE AND THROAT: Ears-Nose (external) Inspection: Externally, nose and ears are normal in appearance and without scars, lesions, or nodules.      Hearing assessment shows no problems with normal conversation.      LUNGS and RESPIRATORY: Lung auscultation elicits no wheezing, rhonci, rales or rubs and with equal breath sounds.    Respiratory effort described as breathing is unlabored and chest movement is symmetrical.    HEART (Cardiovascular): Heart auscultation discovers  2/3 mummer  ABDOMEN (Gastrointestinal): Mass/Tenderness Exam: Neither are present.     MUSCULOSKELETAL (BJE): Inspection-Palpation: No major bone, joint, tendon, or muscle changes.      NEUROLOGICAL: Alert and oriented. No major deficits of coordination or sensation.      PSYCHIATRIC: Insight and judgment appear  both to be intact and appropriate.    Mood and affect are described as normal mood and full affect.    SKIN: Skin Inspection: No rashes or lesions  ADMITTING DIAGNOSIS: Total mature senile cataract   ICD10: H25.89  ICD9: 366.17  Onset: 06/14/2015 11:44  Initial Date:    Glaucoma, chronic angle-closure   ICD10: H40.2290  ICD9: 365.23  Onset:     mixed mechanism  uncontrolled OD   Hypertensive retinopathy   ICD10: H35.039  ICD9: 362.11  Onset:    Nuclear cataract NOS   ICD10:   ICD9: 366.04  Onset:    Asthma - intrinsic, stable   ICD10: J45.909  ICD9: 493.10  Onset:  SURGICAL TREATMENT PLAN: pupil does not dilate OD   phaco emulsion cataract  extraction  with  intraocular lens implant  OD & internal bleb revision  Risk and benefits of surgery have been reviewed with the patient and the patient agrees to proceed with the surgical procedure.    ___________________________ Marylynn Pearson, [name] Starter - Inactive Problems:    Post Dramatic Syndrome    Stroke 2008

## 2015-07-09 ENCOUNTER — Other Ambulatory Visit (HOSPITAL_COMMUNITY): Payer: Medicaid Other

## 2015-07-10 ENCOUNTER — Encounter (HOSPITAL_COMMUNITY): Payer: Self-pay | Admitting: *Deleted

## 2015-07-10 MED ORDER — TROPICAMIDE 1 % OP SOLN
1.0000 [drp] | OPHTHALMIC | Status: DC
  Filled 2015-07-10: qty 3

## 2015-07-10 MED ORDER — PHENYLEPHRINE HCL 2.5 % OP SOLN
1.0000 [drp] | OPHTHALMIC | Status: DC
  Filled 2015-07-10: qty 2

## 2015-07-10 MED ORDER — GATIFLOXACIN 0.5 % OP SOLN
1.0000 [drp] | OPHTHALMIC | Status: DC | PRN
  Filled 2015-07-10: qty 2.5

## 2015-07-10 MED ORDER — CYCLOPENTOLATE HCL 1 % OP SOLN
1.0000 [drp] | OPHTHALMIC | Status: DC
  Filled 2015-07-10: qty 2

## 2015-07-10 MED ORDER — KETOROLAC TROMETHAMINE 0.5 % OP SOLN
1.0000 [drp] | OPHTHALMIC | Status: DC
  Filled 2015-07-10: qty 5

## 2015-07-10 NOTE — Progress Notes (Signed)
Pt denies any recent chest pain or sob. States she hasn't had to use her NTG for over 3 months. She states she's not had to use her albuterol inhaler for over a year.  States she was not instructed by anyone to stop Plavix or Aspirin. Called Dr. Gertie Exon office to ask if pt was supposed to have stopped Plavix and Aspirin. Spoke with Shirlean Mylar and she states no, pt does not have to stop it.

## 2015-07-11 ENCOUNTER — Ambulatory Visit (HOSPITAL_COMMUNITY)
Admission: RE | Admit: 2015-07-11 | Discharge: 2015-07-11 | Disposition: A | Discharge status: Home / Self Care | Payer: Medicaid Other | Source: Ambulatory Visit | Attending: Ophthalmology | Admitting: Ophthalmology

## 2015-07-11 ENCOUNTER — Encounter (HOSPITAL_COMMUNITY)
Admission: RE | Disposition: A | Discharge status: Home / Self Care | Payer: Self-pay | Source: Ambulatory Visit | Attending: Ophthalmology

## 2015-07-11 ENCOUNTER — Ambulatory Visit (HOSPITAL_COMMUNITY): Payer: Medicaid Other | Admitting: Anesthesiology

## 2015-07-11 ENCOUNTER — Encounter (HOSPITAL_COMMUNITY): Payer: Self-pay | Admitting: General Practice

## 2015-07-11 DIAGNOSIS — B1920 Unspecified viral hepatitis C without hepatic coma: Secondary | ICD-10-CM | POA: Diagnosis not present

## 2015-07-11 DIAGNOSIS — J45909 Unspecified asthma, uncomplicated: Secondary | ICD-10-CM | POA: Insufficient documentation

## 2015-07-11 DIAGNOSIS — H2511 Age-related nuclear cataract, right eye: Secondary | ICD-10-CM | POA: Insufficient documentation

## 2015-07-11 DIAGNOSIS — R569 Unspecified convulsions: Secondary | ICD-10-CM | POA: Insufficient documentation

## 2015-07-11 DIAGNOSIS — H402213 Chronic angle-closure glaucoma, right eye, severe stage: Secondary | ICD-10-CM | POA: Insufficient documentation

## 2015-07-11 DIAGNOSIS — I2510 Atherosclerotic heart disease of native coronary artery without angina pectoris: Secondary | ICD-10-CM | POA: Insufficient documentation

## 2015-07-11 DIAGNOSIS — Z87891 Personal history of nicotine dependence: Secondary | ICD-10-CM | POA: Diagnosis not present

## 2015-07-11 DIAGNOSIS — Z8673 Personal history of transient ischemic attack (TIA), and cerebral infarction without residual deficits: Secondary | ICD-10-CM | POA: Insufficient documentation

## 2015-07-11 DIAGNOSIS — I252 Old myocardial infarction: Secondary | ICD-10-CM | POA: Diagnosis not present

## 2015-07-11 DIAGNOSIS — J449 Chronic obstructive pulmonary disease, unspecified: Secondary | ICD-10-CM | POA: Insufficient documentation

## 2015-07-11 DIAGNOSIS — I10 Essential (primary) hypertension: Secondary | ICD-10-CM | POA: Diagnosis not present

## 2015-07-11 DIAGNOSIS — H2510 Age-related nuclear cataract, unspecified eye: Secondary | ICD-10-CM | POA: Diagnosis present

## 2015-07-11 DIAGNOSIS — Z83511 Family history of glaucoma: Secondary | ICD-10-CM | POA: Insufficient documentation

## 2015-07-11 DIAGNOSIS — H35039 Hypertensive retinopathy, unspecified eye: Secondary | ICD-10-CM | POA: Diagnosis not present

## 2015-07-11 HISTORY — PX: CATARACT EXTRACTION W/PHACO: SHX586

## 2015-07-11 LAB — COMPREHENSIVE METABOLIC PANEL
ALBUMIN: 3.8 g/dL (ref 3.5–5.0)
ALK PHOS: 43 U/L (ref 38–126)
ALT: 22 U/L (ref 14–54)
ANION GAP: 7 (ref 5–15)
AST: 28 U/L (ref 15–41)
BILIRUBIN TOTAL: 0.5 mg/dL (ref 0.3–1.2)
BUN: 12 mg/dL (ref 6–20)
CALCIUM: 9 mg/dL (ref 8.9–10.3)
CO2: 26 mmol/L (ref 22–32)
Chloride: 108 mmol/L (ref 101–111)
Creatinine, Ser: 0.83 mg/dL (ref 0.44–1.00)
GFR calc non Af Amer: >60 mL/min (ref >60)
GLUCOSE: 103 mg/dL — AB (ref 65–99)
POTASSIUM: 3.8 mmol/L (ref 3.5–5.1)
SODIUM: 141 mmol/L (ref 135–145)
TOTAL PROTEIN: 7.2 g/dL (ref 6.5–8.1)

## 2015-07-11 LAB — CBC
HEMATOCRIT: 34.9 % — AB (ref 36.0–46.0)
HEMOGLOBIN: 12.2 g/dL (ref 12.0–15.0)
MCH: 30.4 pg (ref 26.0–34.0)
MCHC: 35 g/dL (ref 30.0–36.0)
MCV: 87 fL (ref 78.0–100.0)
Platelets: 234 10*3/uL (ref 150–400)
RBC: 4.01 MIL/uL (ref 3.87–5.11)
RDW: 12.6 % (ref 11.5–15.5)
WBC: 9.7 10*3/uL (ref 4.0–10.5)

## 2015-07-11 SURGERY — PHACOEMULSIFICATION, CATARACT, WITH IOL INSERTION
Anesthesia: Monitor Anesthesia Care | Site: Eye | Laterality: Right

## 2015-07-11 MED ORDER — PROPOFOL 10 MG/ML IV BOLUS
INTRAVENOUS | Status: AC
  Filled 2015-07-11: qty 20

## 2015-07-11 MED ORDER — TROPICAMIDE 1 % OP SOLN
1.0000 [drp] | OPHTHALMIC | Status: AC
  Administered 2015-07-11 (×3): 1 [drp] via OPHTHALMIC

## 2015-07-11 MED ORDER — NA CHONDROIT SULF-NA HYALURON 40-30 MG/ML IO SOLN
INTRAOCULAR | Status: DC | PRN
  Administered 2015-07-11: 0.5 mL via INTRAOCULAR

## 2015-07-11 MED ORDER — BSS IO SOLN
INTRAOCULAR | Status: DC | PRN
  Administered 2015-07-11: 15 mL via INTRAOCULAR

## 2015-07-11 MED ORDER — FLUORESCEIN SODIUM 1 MG OP STRP
ORAL_STRIP | OPHTHALMIC | Status: DC | PRN
  Administered 2015-07-11: 1 via OPHTHALMIC

## 2015-07-11 MED ORDER — EPINEPHRINE HCL 1 MG/ML IJ SOLN
INTRAOCULAR | Status: DC | PRN
  Administered 2015-07-11: 08:00:00

## 2015-07-11 MED ORDER — HYALURONIDASE HUMAN 150 UNIT/ML IJ SOLN
INTRAMUSCULAR | Status: AC
  Filled 2015-07-11: qty 1

## 2015-07-11 MED ORDER — CYCLOPENTOLATE HCL 1 % OP SOLN
1.0000 [drp] | OPHTHALMIC | Status: AC
  Administered 2015-07-11 (×3): 1 [drp] via OPHTHALMIC

## 2015-07-11 MED ORDER — LIDOCAINE HCL (CARDIAC) 20 MG/ML IV SOLN
INTRAVENOUS | Status: AC
  Filled 2015-07-11: qty 10

## 2015-07-11 MED ORDER — PHENYLEPHRINE HCL 2.5 % OP SOLN
1.0000 [drp] | OPHTHALMIC | Status: AC
  Administered 2015-07-11 (×3): 1 [drp] via OPHTHALMIC

## 2015-07-11 MED ORDER — SODIUM CHLORIDE 0.9 % IV SOLN
INTRAVENOUS | Status: DC | PRN
  Administered 2015-07-11: 08:00:00 via INTRAVENOUS

## 2015-07-11 MED ORDER — TOBRAMYCIN 0.3 % OP OINT
TOPICAL_OINTMENT | OPHTHALMIC | Status: DC | PRN
Start: 2015-07-11 — End: 2015-07-11
  Administered 2015-07-11: 1 via OPHTHALMIC

## 2015-07-11 MED ORDER — PILOCARPINE HCL 4 % OP SOLN
OPHTHALMIC | Status: AC
  Filled 2015-07-11: qty 15

## 2015-07-11 MED ORDER — PROPOFOL 10 MG/ML IV BOLUS
INTRAVENOUS | Status: DC | PRN
  Administered 2015-07-11: 60 mg via INTRAVENOUS
  Administered 2015-07-11: 20 mg via INTRAVENOUS

## 2015-07-11 MED ORDER — KETOROLAC TROMETHAMINE 0.5 % OP SOLN
1.0000 [drp] | OPHTHALMIC | Status: AC
  Administered 2015-07-11 (×3): 1 [drp] via OPHTHALMIC

## 2015-07-11 MED ORDER — LIDOCAINE HCL 2 % IJ SOLN
INTRAMUSCULAR | Status: AC
  Filled 2015-07-11: qty 20

## 2015-07-11 MED ORDER — BUPIVACAINE HCL (PF) 0.75 % IJ SOLN
INTRAMUSCULAR | Status: AC
  Filled 2015-07-11: qty 10

## 2015-07-11 MED ORDER — SODIUM HYALURONATE 10 MG/ML IO SOLN
INTRAOCULAR | Status: DC | PRN
  Administered 2015-07-11 (×2): 0.85 mL via INTRAOCULAR

## 2015-07-11 MED ORDER — BSS IO SOLN
INTRAOCULAR | Status: AC
  Filled 2015-07-11: qty 15

## 2015-07-11 MED ORDER — ACETYLCHOLINE CHLORIDE 20 MG IO SOLR
INTRAOCULAR | Status: DC | PRN
  Administered 2015-07-11: 5 mg via INTRAOCULAR

## 2015-07-11 MED ORDER — NA CHONDROIT SULF-NA HYALURON 40-30 MG/ML IO SOLN
INTRAOCULAR | Status: AC
  Filled 2015-07-11: qty 0.5

## 2015-07-11 MED ORDER — TETRACAINE HCL 0.5 % OP SOLN
OPHTHALMIC | Status: AC
  Filled 2015-07-11: qty 2

## 2015-07-11 MED ORDER — LIDOCAINE HCL 2 % IJ SOLN
INTRAMUSCULAR | Status: DC | PRN
  Administered 2015-07-11: 5 mL via RETROBULBAR

## 2015-07-11 MED ORDER — LIDOCAINE HCL (CARDIAC) 20 MG/ML IV SOLN
INTRAVENOUS | Status: DC | PRN
  Administered 2015-07-11: 40 mg via INTRAVENOUS

## 2015-07-11 MED ORDER — BSS IO SOLN
INTRAOCULAR | Status: AC
  Filled 2015-07-11: qty 500

## 2015-07-11 MED ORDER — GATIFLOXACIN 0.5 % OP SOLN
1.0000 [drp] | OPHTHALMIC | Status: AC
  Administered 2015-07-11 (×3): 1 [drp] via OPHTHALMIC

## 2015-07-11 MED ORDER — GENTAMICIN SULFATE 40 MG/ML IJ SOLN
INTRAMUSCULAR | Status: AC
  Filled 2015-07-11: qty 2

## 2015-07-11 MED ORDER — FLUORESCEIN SODIUM 1 MG OP STRP
ORAL_STRIP | OPHTHALMIC | Status: AC
  Filled 2015-07-11: qty 1

## 2015-07-11 MED ORDER — SODIUM HYALURONATE 10 MG/ML IO SOLN
INTRAOCULAR | Status: AC
  Filled 2015-07-11: qty 0.85

## 2015-07-11 MED ORDER — LIDOCAINE-EPINEPHRINE 2 %-1:100000 IJ SOLN
INTRAMUSCULAR | Status: AC
  Filled 2015-07-11: qty 1

## 2015-07-11 MED ORDER — 0.9 % SODIUM CHLORIDE (POUR BTL) OPTIME
TOPICAL | Status: DC | PRN
  Administered 2015-07-11: 1000 mL

## 2015-07-11 MED ORDER — DEXAMETHASONE SODIUM PHOSPHATE 10 MG/ML IJ SOLN
INTRAMUSCULAR | Status: AC
  Filled 2015-07-11: qty 1

## 2015-07-11 MED ORDER — ACETYLCHOLINE CHLORIDE 20 MG IO SOLR
INTRAOCULAR | Status: AC
  Filled 2015-07-11: qty 1

## 2015-07-11 MED ORDER — MITOMYCIN 0.2 MG OP KIT
0.2000 mg | PACK | Freq: Once | OPHTHALMIC | Status: AC
  Administered 2015-07-11: 0.2 mg via OPHTHALMIC
  Filled 2015-07-11: qty 1

## 2015-07-11 MED ORDER — TOBRAMYCIN-DEXAMETHASONE 0.3-0.1 % OP OINT
TOPICAL_OINTMENT | OPHTHALMIC | Status: AC
  Filled 2015-07-11: qty 3.5

## 2015-07-11 MED ORDER — EPINEPHRINE HCL 1 MG/ML IJ SOLN
INTRAMUSCULAR | Status: AC
  Filled 2015-07-11: qty 1

## 2015-07-11 SURGICAL SUPPLY — 34 items
APPLICATOR COTTON TIP 6IN STRL (MISCELLANEOUS) ×2 IMPLANT
APPLICATOR DR MATTHEWS STRL (MISCELLANEOUS) ×2 IMPLANT
BLADE KERATOME 2.75 (BLADE) ×2 IMPLANT
CANNULA ANTERIOR CHAMBER 27GA (MISCELLANEOUS) ×2 IMPLANT
CORDS BIPOLAR (ELECTRODE) IMPLANT
COVER MAYO STAND STRL (DRAPES) ×2 IMPLANT
DRAPE OPHTHALMIC 40X48 W POUCH (DRAPES) ×2 IMPLANT
DRAPE RETRACTOR (MISCELLANEOUS) ×2 IMPLANT
GLOVE BIO SURGEON STRL SZ7 (GLOVE) ×2 IMPLANT
GLOVE BIO SURGEON STRL SZ7.5 (GLOVE) ×4 IMPLANT
GLOVE BIO SURGEON STRL SZ8 (GLOVE) ×2 IMPLANT
GLOVE SURG SS PI 7.0 STRL IVOR (GLOVE) ×2 IMPLANT
GOWN STRL REUS W/ TWL LRG LVL3 (GOWN DISPOSABLE) ×2 IMPLANT
GOWN STRL REUS W/TWL LRG LVL3 (GOWN DISPOSABLE) ×2
KIT BASIN OR (CUSTOM PROCEDURE TRAY) ×2 IMPLANT
KIT ROOM TURNOVER OR (KITS) ×2 IMPLANT
LENS IOL ACRSF IQ PC 23.0 (Intraocular Lens) ×1 IMPLANT
LENS IOL ACRYSOF IQ POST 23.0 (Intraocular Lens) ×2 IMPLANT
NEEDLE 18GX1X1/2 (RX/OR ONLY) (NEEDLE) ×2 IMPLANT
NEEDLE 25GX 5/8IN NON SAFETY (NEEDLE) ×2 IMPLANT
NEEDLE FILTER BLUNT 18X 1/2SAF (NEEDLE) ×1
NEEDLE FILTER BLUNT 18X1 1/2 (NEEDLE) ×1 IMPLANT
NS IRRIG 1000ML POUR BTL (IV SOLUTION) ×2 IMPLANT
PACK CATARACT CUSTOM (CUSTOM PROCEDURE TRAY) ×2 IMPLANT
PAD ARMBOARD 7.5X6 YLW CONV (MISCELLANEOUS) ×2 IMPLANT
PAK PIK CVS CATARACT (OPHTHALMIC) ×2 IMPLANT
RING MALYGIN (MISCELLANEOUS) ×2 IMPLANT
SUT ETHILON 10 0 CS140 6 (SUTURE) IMPLANT
SUT SILK 6 0 G 6 (SUTURE) IMPLANT
SYR TB 1ML LUER SLIP (SYRINGE) ×2 IMPLANT
TIP ABS 45DEG FLARED 0.9MM (TIP) ×2 IMPLANT
TOWEL OR 17X26 10 PK STRL BLUE (TOWEL DISPOSABLE) ×2 IMPLANT
WATER STERILE IRR 1000ML POUR (IV SOLUTION) ×2 IMPLANT
WIPE INSTRUMENT VISIWIPE 73X73 (MISCELLANEOUS) ×2 IMPLANT

## 2015-07-11 NOTE — Anesthesia Preprocedure Evaluation (Signed)
Anesthesia Evaluation  Patient identified by MRN, date of birth, ID band Patient awake    Reviewed: Allergy & Precautions, H&P , NPO status , Patient's Chart, lab work & pertinent test results, reviewed documented beta blocker date and time   Airway Mallampati: II  TM Distance: >3 FB Neck ROM: Full    Dental  (+) Teeth Intact, Missing, Poor Dentition, Dental Advisory Given,    Pulmonary asthma , pneumonia -, resolved, COPD COPD inhaler, former smoker,  breath sounds clear to auscultation        Cardiovascular hypertension, Pt. on home beta blockers and Pt. on medications + CAD and + Past MI Rhythm:Regular Rate:Bradycardia     Neuro/Psych Seizures -, Well Controlled,   Neuromuscular disease CVA, Residual Symptoms    GI/Hepatic (+) Hepatitis -, C  Endo/Other    Renal/GU      Musculoskeletal   Abdominal   Peds  Hematology   Anesthesia Other Findings   Reproductive/Obstetrics                             Anesthesia Physical  Anesthesia Plan  ASA: III  Anesthesia Plan: MAC   Post-op Pain Management:    Induction:   Airway Management Planned: Simple Face Mask  Additional Equipment:   Intra-op Plan:   Post-operative Plan:   Informed Consent: I have reviewed the patients History and Physical, chart, labs and discussed the procedure including the risks, benefits and alternatives for the proposed anesthesia with the patient or authorized representative who has indicated his/her understanding and acceptance.   Dental advisory given  Plan Discussed with: CRNA  Anesthesia Plan Comments:         Anesthesia Quick Evaluation

## 2015-07-11 NOTE — Anesthesia Postprocedure Evaluation (Signed)
Anesthesia Post Note  Patient: [name]  Procedure(s) Performed: Procedure(s) (LRB): CATARACT EXTRACTION PHACO AND INTRAOCULAR LENS PLACEMENT ([place]) RIGHT,INTERNAL PLEB REVISION (Right)  Anesthesia type: MAC  Patient location: PACU  Post pain: Pain level controlled  Post assessment: Post-op Vital signs reviewed  Last Vitals: BP 168/70 mmHg  Pulse 56  Temp(Src) 36.7 C (Oral)  Resp 18  Ht 5' 4.5" (1.638 m)  Wt 146 lb (66.225 kg)  BMI 24.68 kg/m2  SpO2 100%  Post vital signs: Reviewed  Level of consciousness: awake  Complications: No apparent anesthesia complications

## 2015-07-11 NOTE — Interval H&P Note (Signed)
History and Physical Interval Note:  07/11/2015 8:32 AM  [name]  has presented today for surgery, with the diagnosis of [name]  The various methods of treatment have been discussed with the patient and family. After consideration of risks, benefits and other options for treatment, the patient has consented to  Procedure(s): CATARACT EXTRACTION PHACO AND INTRAOCULAR LENS PLACEMENT ([facility]) RIGHT,INTERNAL PLEB REVISION (Right) as a surgical intervention .  The patient's history has been reviewed, patient examined, no change in status, stable for surgery.  I have reviewed the patient's chart and labs.  Questions were answered to the patient's satisfaction.     [name]

## 2015-07-11 NOTE — Interval H&P Note (Signed)
History and Physical Interval Note:  07/11/2015 8:28 AM  [name]  has presented today for surgery, with the diagnosis of [place]  The various methods of treatment have been discussed with the patient and family. After consideration of risks, benefits and other options for treatment, the patient has consented to  Procedure(s): CATARACT EXTRACTION PHACO AND INTRAOCULAR LENS PLACEMENT ([facility]) RIGHT,INTERNAL PLEB REVISION (Right) as a surgical intervention .  The patient's history has been reviewed, patient examined, no change in status, stable for surgery.  I have reviewed the patient's chart and labs.  Questions were answered to the patient's satisfaction.     Marlane Hirschmann

## 2015-07-11 NOTE — Transfer of Care (Signed)
Immediate Anesthesia Transfer of Care Note  Patient: [name]  Procedure(s) Performed: Procedure(s): CATARACT EXTRACTION PHACO AND INTRAOCULAR LENS PLACEMENT ([facility]) RIGHT,INTERNAL PLEB REVISION (Right)  Patient Location: PACU  Anesthesia Type:MAC  Level of Consciousness: awake, alert , oriented and patient cooperative  Airway & Oxygen Therapy: Patient Spontanous Breathing and Patient connected to nasal cannula oxygen  Post-op Assessment: Report given to RN, Post -op Vital signs reviewed and stable and Patient moving all extremities  Post vital signs: Reviewed and stable  Last Vitals:  Filed Vitals:   07/11/15 1041  BP: 168/70  Pulse: 56  Temp:   Resp:     Complications: No apparent anesthesia complications

## 2015-07-11 NOTE — Discharge Instructions (Signed)
The patient should avoid heavy lifting bending or straining. Sleep on back or left side. The patient may remove the patch and shield today at 2:00 PM. The patient should sleep using the plastic eye shield over her eye. The patient may take Tylenol every 4 hours as needed for pain.

## 2015-07-11 NOTE — H&P (View-Only) (Signed)
History & Physical:   DATE:   06-14-15  NAME:  [name], [name]     [id number]       HISTORY OF PRESENT ILLNESS: Chief Eye Complaints    Patient states Va comes and goes. Patient c/o OD>OS running water all the time. Patient is only using OTC gtts.    patient  missed her last appointment & her phone was broken   HPI: EYES: Reports symptoms of " Va worse OD since I broke my glasses" interfering with all activities              ACTIVE PROBLEMS: Total mature senile cataract   ICD10: H25.89  ICD9: 366.17  Onset: 06/14/2015 11:44  Initial Date:    Glaucoma, chronic angle-closure   ICD10: H40.2290  ICD9: 365.23  Onset:   Initial Date:     mixed mechanism  uncontrolled OD   Hypertensive retinopathy   ICD10: H35.039  ICD9: 362.11  Onset:   Initial Date:    Nuclear cataract NOS   ICD10:   ICD9: 366.04  Onset:   Initial Date:    Asthma - intrinsic, stable   ICD10: J45.909  ICD9: 493.10  Onset:   Initial Date:  SURGERIES: Trabeculectomy w/[facility] OD 09/07/13 Trabeculectomy w/[facility] OS 06/29/2013  PERIPHERAL  IRIDECTOMY   OD 02/04/2013  PERIPHERAL  IRIDECTOMY   OU 11/30/2012 Pick List - Surgeries  C-Section x2  MEDICATIONS: Zacarias Pontes: 0.3% suspension SIG-  1 gtt in right eye once a day    Ciloxan (Ciprofloxacin) Solution: 0.3% solution SIG-  1 drop in right eye once daily   Artificial Tears Plus: Strength-   SIG-  Dose-  Freq-  REVIEW OF SYSTEMS: ROS:   GEN- Constitutional: HENT: GEN - Endocrine: Reports symptoms of LUNGS/Respiratory:  asthma HEART/Cardiovascular: Reports symptoms of hypertension.      HTN ABD/Gastrointestinal:  Musculoskeletal (BJE): NEURO/Neurological: Stroke  PSYCH/Psychiatric:    Is the pt oriented to time, place, person? yes Mood  normal     TOBACCO: Never smoker   ICD10: Z87.898 ICD9: V13.89   No exposure to tobacco.  FAMILY HISTORY: Positive family history for  -   Glaucoma: Glaucoma  /HTN Negative family history for  -   PARENTS: Glaucoma:;    Hypertension:   Father Glaucoma   Mother HTN: SIBLINGS:  Sister HTN UNCLES/AUNTS:  2-3 aunts Glaucoma OTHERS/DISTANT:    Family History - 1st Degree Relatives:  Mother dead.   Family History - 1st Degree Relatives:   Family History - 1st Degree Relatives:  Father is dead.  ALLERGIES: no beta blockers Drug Allergies.  No Known.  PHYSICAL EXAMINATION: Exam: GENERAL: Appearance: General appearance can be described as well-nourished, well-developed, and in no acute distress.        VS: BMI: 26.0.  BP: 160/84.  H: 66.00 in.  P: 76 /min.  RR: 16 /min.  W: 161lbs 0oz.    Va  OD:[place] HM  PH 20/NI  OS:[place] 20/50+1 PH20/NI  EYEGLASSES: broken  OD:+1.00 +0.50 x107                                           OS:+1.00 +0.25 x106 ADD:+2.75  MR:  06/14/2015 09:51  OD:+1.00 +0.50 x108   NI OS  +1.50 sph   20/30- ADD:  +  2.25  K's07/28/2016 11:56  OD:  40.25    axis 038    41.75     axis 128 OS:  41.50    axis 133    42.00    axis 043  VF:   OD: decrease vision in all four quadrants OS: full in all four quadrants  Motility: Full OU  PUPILS: 58mm OU not detected MG   EYELIDS & OCULAR ADNEXA:  normal    SLE: Conjunctiva: OD: elevated  low bleb - leak OU OS: elevated  bleb - leak     Cornea:  Arcus  K-spindle OU, endothelial pigment OU    Anterior Chamber: QH:UTML w/ trace flare   YY:TKPT and quiet    Iris: PERIPHERAL  IRIDECTOMY open   OU  Lens:  + synechiae OU, mature cataract OD  OS 1-2  nuclear  sclerosis    Ta   in mmHg:                       OD:   20  after dpx5 14                 OS:  17,18 Time07/28/2016 10:52   Gonio: OD:Sup and Inf. TM nasal internal sclerostomy  deep ? patency  WS:FKCLE open to TM w/squeezing OS  Dilation:  trop 1%  phenylephrine 2.5% 06/14/2015 10:53    Fundus:  optic nerve:   XN:TZGYFV 85-90% cup                                             OS:90% cup inf. rim loss   Macula:      poor view   Vessels:Silver wire   OU  Periphery:  flat    Visual Fields  severe constriction OU  Exam: GENERAL: Appearance: HEAD, EARS, NOSE AND THROAT: Ears-Nose (external) Inspection: Externally, nose and ears are normal in appearance and without scars, lesions, or nodules.      Hearing assessment shows no problems with normal conversation.      LUNGS and RESPIRATORY: Lung auscultation elicits no wheezing, rhonci, rales or rubs and with equal breath sounds.    Respiratory effort described as breathing is unlabored and chest movement is symmetrical.    HEART (Cardiovascular): Heart auscultation discovers  2/3 mummer  ABDOMEN (Gastrointestinal): Mass/Tenderness Exam: Neither are present.     MUSCULOSKELETAL (BJE): Inspection-Palpation: No major bone, joint, tendon, or muscle changes.      NEUROLOGICAL: Alert and oriented. No major deficits of coordination or sensation.      PSYCHIATRIC: Insight and judgment appear  both to be intact and appropriate.    Mood and affect are described as normal mood and full affect.    SKIN: Skin Inspection: No rashes or lesions  ADMITTING DIAGNOSIS: Total mature senile cataract   ICD10: H25.89  ICD9: 366.17  Onset: 06/14/2015 11:44  Initial Date:    Glaucoma, chronic angle-closure   ICD10: H40.2290  ICD9: 365.23  Onset:     mixed mechanism  uncontrolled OD   Hypertensive retinopathy   ICD10: H35.039  ICD9: 362.11  Onset:    Nuclear cataract NOS   ICD10:   ICD9: 366.04  Onset:    Asthma - intrinsic, stable   ICD10: J45.909  ICD9: 493.10  Onset:  SURGICAL TREATMENT PLAN: pupil does not dilate OD   phaco emulsion cataract  extraction  with  intraocular lens implant  OD & internal bleb revision  Risk and benefits of surgery have been reviewed with the patient and the patient agrees to proceed with the surgical procedure.    ___________________________ Marylynn Pearson, [name] Starter - Inactive Problems:    Post Dramatic Syndrome    Stroke 2008

## 2015-07-11 NOTE — Anesthesia Procedure Notes (Signed)
Procedure Name: MAC Date/Time: 07/11/2015 8:45 AM Performed by: Lowella Dell Pre-anesthesia Checklist: Patient identified, Emergency Drugs available, Suction available, Patient being monitored and Timeout performed Patient Re-evaluated:Patient Re-evaluated prior to inductionOxygen Delivery Method: Nasal cannula Intubation Type: IV induction Dental Injury: Teeth and Oropharynx as per pre-operative assessment

## 2015-07-11 NOTE — Progress Notes (Signed)
Patient did not take Tegretol, Amlodipine, and Coreg morning of surgery.  Dr. Lissa Hoard aware of vitals, heart rate of 54, BP 151/95, and instructed nurse not to give medications prior to surgery.  Patient states that taking morning medications without food makes her sick on her stomach.

## 2015-07-11 NOTE — Op Note (Signed)
Preoperative diagnosis: Visually significant cataract and chronic angle-closure glaucoma severe stage right eye with non-dilating pupil Postop diagnosis: Same Procedure: Phacoemulsification with intraocular lens implant using special device to dilate the pupil and internal revision of glaucoma surgery right eye Anesthesia: 2% Xylocaine in a 50-50 mixture 0.75% Marcaine with ample W[place]ase Complications None Assistant: Unknown Foley Procedure: The patient is transported to the operating room where she was given a peribulbar block with the aforementioned local and static agent. Following this the patient's face is prepped and draped in the usual sterile fashion with a surgeon sitting temporally the operating microscope in position it was noted that the pupil was only 3 mm with significant posterior synechiae using a Weck-Cel sponge to fixate the globe a 15 blade was used to enter through clear cornea adjacent to a large superior nasal bleb with an additional Weck-Cel sponge on the globe a 2.75 mm keratome blade was used in a stepwise fashion to enter through clear cornea an iris sweep was then used to separate the adhesions from the iris to the lens Provisc was injected in the anterior chamber the Mylugin ring was then placed to dilate the pupil to 6 mm. A 25-gauge needle was then used to incise anterior capsule and a continuous tear curvilinear capsulorrhexis was formed the anterior capsule was removed with the capsule forceps BSS was used to hydrodissect the hydrodelineate the nucleus and loosen the nucleus within the capsular bag. The fake emulsification unit was then used to remove the epinucleus and sculpt a central trough the nucleus was divided into 4 quadrants and all nuclear fragments were removed. The irrigation-aspiration device was then used to remove the posterior epinucleus and strip cortical fibers the posterior capsule the posterior capsule was polished. Following this Provisc was injected in the  anterior chamber. The intraocular lens implant was examined and noted to have no defects the lens is in [employer]con [employer]of SN 60 WF IQ lens 23.0 dpt SN number [id number] the lens is placed in the lens injector and injected and positioned with a Kuglen hook. I/A was used to remove any residual cortical material an additional Provisc was injected in the chamber. The patient was asked to rotate her head to a 35 angle Viscoat was placed on the cornea the [employer] gonioscopy lens was placed on the eye the large second iridectomy was visible with visible ciliary processes however it was not easy to visualize the internal sclerostomy. After positioning and repositioning several times the area of the trabecular meshwork was identified there was a slitlike area using a iris sweep was passed across the anterior chamber and into this area and noted to exit beneath the bleb under the conjunctiva. This was withdrawn from the eye and there was a small amount of heme noted at this point with a flat anterior chamber BSS was injected to reinflate the anterior chamber 4 seen was applied to the bleb and there was no leakage noted. The irrigation-aspiration device was then used to remove all the viscoelastic from the anterior chamber. Miochol was injected to constrict the pupil single 10-0 nylon suture was placed at the incision the eye was pressurized and there was no leakage. All instruments were then removed from the eye topical Tobradex ointment was applied to the eye a patch and Fox U were placed and the patient returned to recovery area in stable condition. Marylynn Pearson Junior M.D.

## 2015-07-12 ENCOUNTER — Encounter (HOSPITAL_COMMUNITY): Payer: Self-pay | Admitting: Ophthalmology

## 2015-08-07 ENCOUNTER — Telehealth: Payer: Self-pay | Admitting: Pharmacist Clinician (PhC)/ Clinical Pharmacy Specialist

## 2015-08-07 NOTE — Telephone Encounter (Signed)
[name] called to inquire about her meds for hep c. She was previously denied because she did not meet the criteria due to being F1-F2. The other issue is that she is also on carbamezepine for sz. This would be an issue no matter what therapy we choose. I told to schedule an appt with her PCP now to see if they can change to Keppra. She said that she will do so.

## 2015-09-06 ENCOUNTER — Telehealth: Payer: Self-pay | Admitting: *Deleted

## 2015-09-06 ENCOUNTER — Ambulatory Visit: Payer: Medicaid Other | Admitting: Internal Medicine

## 2015-09-06 NOTE — Telephone Encounter (Signed)
Scheduled for return visit.  11/20/15

## 2015-11-20 ENCOUNTER — Ambulatory Visit (INDEPENDENT_AMBULATORY_CARE_PROVIDER_SITE_OTHER): Payer: Medicaid Other | Admitting: Internal Medicine

## 2015-11-20 ENCOUNTER — Encounter: Payer: Self-pay | Admitting: Internal Medicine

## 2015-11-20 VITALS — BP 133/86 | HR 65 | Temp 98.2°F | Ht 65.0 in | Wt 147.0 lb

## 2015-11-20 DIAGNOSIS — K7400 Hepatic fibrosis, unspecified: Secondary | ICD-10-CM

## 2015-11-20 DIAGNOSIS — B182 Chronic viral hepatitis C: Secondary | ICD-10-CM

## 2015-11-20 NOTE — Assessment & Plan Note (Signed)
Will recheck elastography and viral load, genotype.  Once that is done, will reattempt prescription and change to Keppra.

## 2015-11-20 NOTE — Assessment & Plan Note (Signed)
Mild fibrosis but will recheck.

## 2015-11-20 NOTE — Progress Notes (Signed)
   Subjective:    Patient ID: [name], female    DOB: [birth year], 60 y.o.   MRN: [id number]  HPI Here for follow up on HCV.  Last seen over 1 year ago and prescribed Harvoni but denied by Medicaid due to F1/2 fibrosis.  Is genotype 1b.  Also on tegretol and was going to change to Keppra but did not since she was not approved anyway.     Review of Systems  Constitutional: Negative for fatigue.  Skin: Negative for rash.       Objective:   Physical Exam  Constitutional: She appears well-developed and well-nourished. No distress.  Eyes: No scleral icterus.  Cardiovascular: Normal rate, regular rhythm and normal heart sounds.   No murmur heard. Pulmonary/Chest: Effort normal and breath sounds normal. No respiratory distress.  Skin: No rash noted.          Assessment & Plan:

## 2015-11-22 LAB — HCV RNA QUANT RFLX ULTRA OR GENOTYP
HCV QUANT LOG: 6.34 {Log} — AB (ref <1.18)
HCV Quantitative: 2166834 IU/mL — ABNORMAL HIGH (ref <15)

## 2015-11-27 LAB — HEPATITIS C GENOTYPE

## 2015-12-24 ENCOUNTER — Telehealth (HOSPITAL_COMMUNITY): Payer: Self-pay | Admitting: Radiology

## 2015-12-24 NOTE — Telephone Encounter (Signed)
Called to remind pt of 9am appt at [facility] in radiology. Pt agreed to arrive at 8:45am and stay NPO. AW

## 2015-12-25 ENCOUNTER — Ambulatory Visit (HOSPITAL_COMMUNITY)
Admission: RE | Admit: 2015-12-25 | Discharge: 2015-12-25 | Disposition: A | Discharge status: Home / Self Care | Payer: Medicaid Other | Source: Ambulatory Visit | Attending: Internal Medicine | Admitting: Internal Medicine

## 2015-12-25 DIAGNOSIS — B182 Chronic viral hepatitis C: Secondary | ICD-10-CM | POA: Diagnosis present

## 2016-01-15 ENCOUNTER — Telehealth: Payer: Self-pay | Admitting: Pharmacy Technician

## 2016-01-15 NOTE — Telephone Encounter (Signed)
She understands I will call her next week to see when she gets switched from Tegretol to [place].  Then I will submit for Harvoni to [facility]

## 2016-01-22 ENCOUNTER — Ambulatory Visit (INDEPENDENT_AMBULATORY_CARE_PROVIDER_SITE_OTHER): Payer: Medicaid Other | Admitting: Family Medicine

## 2016-01-22 ENCOUNTER — Encounter: Payer: Self-pay | Admitting: Family Medicine

## 2016-01-22 VITALS — BP 140/80 | HR 72 | Temp 98.0°F | Ht 65.0 in | Wt 146.0 lb

## 2016-01-22 DIAGNOSIS — Z1239 Encounter for other screening for malignant neoplasm of breast: Secondary | ICD-10-CM

## 2016-01-22 DIAGNOSIS — F4321 Adjustment disorder with depressed mood: Secondary | ICD-10-CM

## 2016-01-22 NOTE — Patient Instructions (Signed)
Thank you for coming to see me today. It was a pleasure. Today we talked about:   Tegretol: I would like you to stop taking this medication since you are only taking it as needed. I hope that your Hepatitis C treatment goes well!!! Please make an appointment of pap smear. I am placing an order for a mammogram. Please have that done.   Please make an appointment to see me in 1-2 months for a physical.  If you have any questions or concerns, please do not hesitate to call the office at [phone].  Sincerely,  Cordelia Poche, MD

## 2016-01-22 NOTE — Progress Notes (Signed)
    Subjective    [name] is a 60 y.o. female that presents for a follow-up visit for:   1. Post traumatic stress: Patient states she is on Tegretol 200mg  twice daily. She is now about to start a new Hepatitis C regimen which will negatively interact with her Tegretol. She states she does not have a history of seizures. She does not take Tegretol daily.    Social History  Substance Use Topics  . Smoking status: Former Smoker -- 0.50 packs/day    Types: Cigarettes    Quit date: 07/09/2012  . Smokeless tobacco: Never Used  . Alcohol Use: No     Comment: quit 4 years ago    No Known Allergies  No orders of the defined types were placed in this encounter.    ROS  Per HPI   Objective   BP 140/80 mmHg  Pulse 72  Temp(Src) 98 F (36.7 C) (Oral)  Ht 5\' 5"  (1.651 m)  Wt 146 lb (66.225 kg)  BMI 24.30 kg/m2  Vital signs reviewed  General: Fair appearing, no distress  Assessment and Plan    ADJUSTMENT DISORDER WITH DEPRESSED MOOD Unsure of why patient is on Tegretol. Will discontinue since patient currently taking PRN.

## 2016-01-23 NOTE — Assessment & Plan Note (Signed)
Unsure of why patient is on Tegretol. Will discontinue since patient currently taking PRN.

## 2016-01-24 ENCOUNTER — Other Ambulatory Visit: Payer: Self-pay | Admitting: Pharmacist Clinician (PhC)/ Clinical Pharmacy Specialist

## 2016-01-24 ENCOUNTER — Other Ambulatory Visit: Payer: Self-pay | Admitting: Family Medicine

## 2016-01-24 DIAGNOSIS — Z1231 Encounter for screening mammogram for malignant neoplasm of breast: Secondary | ICD-10-CM

## 2016-01-24 MED ORDER — LEDIPASVIR-SOFOSBUVIR 90-400 MG PO TABS: 1.0000 | ORAL_TABLET | Freq: Every day | ORAL | Status: DC

## 2016-01-24 MED FILL — HARVONI 90-400 MG TABLET: 90-400 | 28 days supply | Qty: 28 | Fill #0

## 2016-01-30 ENCOUNTER — Encounter: Payer: Self-pay | Admitting: Pharmacy Technician

## 2016-02-07 ENCOUNTER — Ambulatory Visit: Payer: Medicaid Other

## 2016-02-19 ENCOUNTER — Ambulatory Visit: Payer: Medicaid Other | Admitting: Pharmacist

## 2016-02-19 DIAGNOSIS — B182 Chronic viral hepatitis C: Secondary | ICD-10-CM

## 2016-02-19 MED ORDER — LEDIPASVIR-SOFOSBUVIR 90-400 MG PO TABS: 1.0000 | ORAL_TABLET | Freq: Every day | ORAL | Status: DC

## 2016-02-19 MED FILL — HARVONI 90-400 MG TABLET: 90-400 | 28 days supply | Qty: 28 | Fill #1

## 2016-02-19 NOTE — Progress Notes (Signed)
HPI: [name] is a 60 y.o. female who presents to the [facility] today for follow-up of her Hep C infection.  Lab Results  Component Value Date   HCVGENOTYPE 1b 11/20/2015    Allergies: No Known Allergies  Past Medical History: Past Medical History  Diagnosis Date  . Adjustment disorder with depressed mood 01/11/2009  . CAD 07/10/2008  . COCAINE DEPENDENCY NOS 01/14/2007  . CVA 01/14/2007  . HEPATITIS C 02/26/2009  . HYPERTENSION, BENIGN SYSTEMIC 01/14/2007  . TOBACCO DEPENDENCE 01/14/2007  . MI (myocardial infarction) ([place]) 12/20/2007    had stent placed  . Coronary artery disease 01/14/2012  . Asthma   . COPD (chronic obstructive pulmonary disease) ([place])   . PTSD (post-traumatic stress disorder)   . Chronic back pain   . Sciatica   . PTSD (post-traumatic stress disorder)   . Right sided weakness     d/t stroke  . Memory loss     d/t CVA  . Glaucoma of both eyes   . Pneumonia     hx of  . Arthritis   . Hyperlipemia   . Headache(784.0)   . Stroke [facility])     right side weakness  . CONVULSIONS, SEIZURES, NOS 01/14/2007    last seizure May, 2016  . Family history of anesthesia complication [id number]    Pts sister died after having anesthesia    Social History: Social History   Social History  . Marital Status: Single    Spouse Name: N/A  . Number of Children: N/A  . Years of Education: N/A   Social History Main Topics  . Smoking status: Former Smoker -- 0.50 packs/day    Types: Cigarettes    Quit date: 07/09/2012  . Smokeless tobacco: Never Used  . Alcohol Use: No     Comment: quit 4 years ago  . Drug Use: No     Comment: last use of cocaine 6 years ago  . Sexual Activity: Not on file   Other Topics Concern  . Not on file   Social History Narrative   On disability- due to CVA/ PTSD and MI-- weak on right side from stroke.       Has 2 children.  Living with sister here in [place].     Labs: HEP B S AB (no units)  Date Value  06/15/2014 NEG    HEPATITIS B SURFACE AG (no units)  Date Value  06/15/2014 NEGATIVE   HCV AB (no units)  Date Value  02/16/2009 REACTIVE*    Lab Results  Component Value Date   HCVGENOTYPE 1b 11/20/2015    Hepatitis C RNA quantitative Latest Ref Rng 11/20/2015 05/30/2014 03/05/2009  HCV Quantitative <15 IU/mL DB:[id number]) [id number](H) 2150000(H)  HCV Quantitative Log <1.18 log 10 6.34(H) 5.74(H) -    AST (U/L)  Date Value  07/11/2015 28  05/30/2014 25  05/12/2014 25   ALT (U/L)  Date Value  07/11/2015 22  05/30/2014 18  05/12/2014 18   INR (no units)  Date Value  06/15/2014 1.13  05/16/2009 1.1  12/20/2007 1.1    CrCl: CrCl cannot be calculated (Unknown ideal weight.).  Fibrosis Score: F2/3 as assessed by elastography   Previous Treatment Regimen: None  Assessment: [name] is here today to follow-up with the pharmacist after starting [place] for her Hep C infection.  She just started around March 15 and has about 7-8 pills left of her first month. She is tolerating her Harvoni well so far.  She does  get nauseated when she takes it even when she takes it with food.  It lasts for ~10 minutes and then stops.  She was recently taken off Tegretol, which is good as it interacts with Harvoni. She tells me she does not take anything OTC for acid reflux or heartburn.  I instructed her not to while she in on Harvoni. She has not missed a single dose so far and has no questions for [place].  I emphasized the importance of not missing any doses and calling us if she has any problems or issues.  She will come back next Tuesday April 11 at 0945 for labs, and has a follow-up appointment with Dr. Linus Salmons on May 16 at 3:00 pm.  Recommendations: Continue taking Harvoni Follow-up with lab next Tuesday Follow-up with Dr. Linus Salmons on May 16 at 3:00pm  Arneisha Kincannon L. Nicole Kindred, PharmD PGY2 Infectious Diseases Pharmacy Resident Pager: [phone] 02/19/2016 11:47 AM

## 2016-02-25 ENCOUNTER — Ambulatory Visit (INDEPENDENT_AMBULATORY_CARE_PROVIDER_SITE_OTHER): Payer: Medicaid Other | Admitting: Family Medicine

## 2016-02-25 ENCOUNTER — Encounter: Payer: Self-pay | Admitting: Family Medicine

## 2016-02-25 VITALS — BP 138/73 | HR 71 | Temp 98.4°F | Ht 65.0 in | Wt 146.2 lb

## 2016-02-25 DIAGNOSIS — Z124 Encounter for screening for malignant neoplasm of cervix: Secondary | ICD-10-CM | POA: Diagnosis not present

## 2016-02-25 DIAGNOSIS — E785 Hyperlipidemia, unspecified: Secondary | ICD-10-CM

## 2016-02-25 DIAGNOSIS — Z0000 Encounter for general adult medical examination without abnormal findings: Secondary | ICD-10-CM

## 2016-02-25 DIAGNOSIS — I10 Essential (primary) hypertension: Secondary | ICD-10-CM | POA: Diagnosis not present

## 2016-02-25 DIAGNOSIS — R739 Hyperglycemia, unspecified: Secondary | ICD-10-CM

## 2016-02-25 DIAGNOSIS — Z1239 Encounter for other screening for malignant neoplasm of breast: Secondary | ICD-10-CM | POA: Diagnosis not present

## 2016-02-25 LAB — CBC WITH DIFFERENTIAL/PLATELET
BASOS ABS: 0 {cells}/uL (ref 0–200)
Basophils Relative: 0 %
EOS PCT: 3 %
Eosinophils Absolute: 279 cells/uL (ref 15–500)
HEMATOCRIT: 36.5 % (ref 35.0–45.0)
HEMOGLOBIN: 12.6 g/dL (ref 11.7–15.5)
LYMPHS ABS: 4557 {cells}/uL — AB (ref 850–3900)
Lymphocytes Relative: 49 %
MCH: 30 pg (ref 27.0–33.0)
MCHC: 34.5 g/dL (ref 32.0–36.0)
MCV: 86.9 fL (ref 80.0–100.0)
MONO ABS: 651 {cells}/uL (ref 200–950)
MPV: 8.7 fL (ref 7.5–12.5)
Monocytes Relative: 7 %
NEUTROS ABS: 3813 {cells}/uL (ref 1500–7800)
NEUTROS PCT: 41 %
Platelets: 254 10*3/uL (ref 140–400)
RBC: 4.2 MIL/uL (ref 3.80–5.10)
RDW: 13.8 % (ref 11.0–15.0)
WBC: 9.3 10*3/uL (ref 3.8–10.8)

## 2016-02-25 LAB — TSH: TSH: 0.67 mIU/L

## 2016-02-25 LAB — POCT GLYCOSYLATED HEMOGLOBIN (HGB A1C): Hemoglobin A1C: 5.7

## 2016-02-25 NOTE — Patient Instructions (Signed)
Thank you for coming to see me today. It was a pleasure. Today we talked about:   Physical: You will get your results in the mail. Please follow-up in 3 months for management of your blood pressure and other concerns.  If you have any questions or concerns, please do not hesitate to call the office at [phone].  Sincerely,  Cordelia Poche, MD

## 2016-02-25 NOTE — Progress Notes (Signed)
Subjective    [name] is a 60 y.o. female that presents for yearly physical exam.   Concerns:  None  Goals    None      No obstetric history on file. Wt Readings from Last 3 Encounters:  02/25/16 146 lb 3.2 oz (66.316 kg)  01/22/16 146 lb (66.225 kg)  11/20/15 147 lb (66.679 kg)   Last period:  2000 Regular periods: No Heavy bleeding: No  Sexually active: no Birth control or hormonal therapy: None Hx of STD: Chlamydia, treated Dyspareunia: No Hot flashes: No Vaginal discharge: None Dysuria: No  Last mammogram: N/A Breast mass or concerns: No Last Pap: 03/22/2005  History of abnormal pap: No   FH of breast, uterine, ovarian, colon cancer: No  Past Medical History  Diagnosis Date  . Adjustment disorder with depressed mood 01/11/2009  . CAD 07/10/2008  . COCAINE DEPENDENCY NOS 01/14/2007  . CVA 01/14/2007  . HEPATITIS C 02/26/2009  . HYPERTENSION, BENIGN SYSTEMIC 01/14/2007  . TOBACCO DEPENDENCE 01/14/2007  . MI (myocardial infarction) ([place]) 12/20/2007    had stent placed  . Coronary artery disease 01/14/2012  . Asthma   . COPD (chronic obstructive pulmonary disease) ([name])   . PTSD (post-traumatic stress disorder)   . Chronic back pain   . Sciatica   . PTSD (post-traumatic stress disorder)   . Right sided weakness     d/t stroke  . Memory loss     d/t CVA  . Glaucoma of both eyes   . Pneumonia     hx of  . Arthritis   . Hyperlipemia   . Headache(784.0)   . Stroke [facility])     right side weakness  . CONVULSIONS, SEIZURES, NOS 01/14/2007    last seizure May, 2016  . Family history of anesthesia complication [id number]    Pts sister died after having anesthesia    Past Surgical History  Procedure Laterality Date  . Cesarean section      x 2  . Coronary angioplasty with stent placement  2009    1 stent  . Colonoscopy w/ polypectomy    . Tonsillectomy    . Trabeculectomy Left 06/29/2013    Procedure: TRABECULECTOMY WITH MYTOMICIN C LEFT EYE;  Surgeon: Marylynn Pearson, MD;  Location: [place];  Service: Ophthalmology;  Laterality: Left;  . Trabeculectomy Right 09/07/2013    Procedure: TRABECULECTOMY RIGHT EYE;  Surgeon: Marylynn Pearson, MD;  Location: [place];  Service: Ophthalmology;  Laterality: Right;  . Mitomycin c application Right [id number]    Procedure: MITOMYCIN C APPLICATION RIGHT EYE;  Surgeon: Marylynn Pearson, MD;  Location: [place];  Service: Ophthalmology;  Laterality: Right;  . Cataract extraction w/phaco Right 07/11/2015    Procedure: CATARACT EXTRACTION PHACO AND INTRAOCULAR LENS PLACEMENT ([place]) RIGHT,INTERNAL PLEB REVISION;  Surgeon: Marylynn Pearson, MD;  Location: [place];  Service: Ophthalmology;  Laterality: Right;    Current Outpatient Prescriptions on File Prior to Visit  Medication Sig Dispense Refill  . albuterol (PROVENTIL HFA;VENTOLIN HFA) 108 (90 BASE) MCG/ACT inhaler Inhale 2 puffs into the lungs every 6 (six) hours as needed for wheezing. For wheezing    . amLODipine (NORVASC) 5 MG tablet Take 10 mg by mouth every morning.     [name] aspirin 81 MG EC tablet Take 81 mg by mouth daily.      [name] atorvastatin (LIPITOR) 40 MG tablet Take 40 mg by mouth every morning.     . carvedilol (COREG) 12.5 MG tablet Take  12.5 mg by mouth 2 (two) times daily with a meal.    . clopidogrel (PLAVIX) 75 MG tablet Take 75 mg by mouth every morning.     [name] gatifloxacin (ZYMAXID) 0.5 % SOLN 1 drop.    [name] ketorolac (ACULAR) 0.5 % ophthalmic solution 1 drop 4 (four) times daily.    . Ledipasvir-Sofosbuvir (HARVONI) 90-400 MG TABS Take 1 tablet by mouth daily. 28 tablet 2  . lisinopril-hydrochlorothiazide (PRINZIDE,ZESTORETIC) 20-12.5 MG per tablet Take 1 tablet by mouth every morning.    . nitroGLYCERIN (NITROSTAT) 0.4 MG SL tablet Place 0.4 mg under the tongue every 5 (five) minutes as needed. x3 doses as needed for chest pain    . tobramycin-dexamethasone (TOBRADEX) ophthalmic ointment 1 application 3 (three) times daily.     Current Facility-Administered Medications  on File Prior to Visit  Medication Dose Route Frequency Provider Last Rate Last Dose  . mitoMYcin (MUTAMYCIN) Injection Use in OR only (0.4 mg/ml)  0.5 mL Right Eye Once Marylynn Pearson, MD        No Known Allergies  Social History   Social History  . Marital Status: Single    Spouse Name: N/A  . Number of Children: N/A  . Years of Education: N/A   Social History Main Topics  . Smoking status: Former Smoker -- 0.50 packs/day    Types: Cigarettes    Quit date: 07/09/2012  . Smokeless tobacco: Never Used  . Alcohol Use: No     Comment: quit 4 years ago  . Drug Use: No     Comment: last use of cocaine 6 years ago  . Sexual Activity: Not Asked   Other Topics Concern  . None   Social History Narrative   On disability- due to CVA/ PTSD and MI-- weak on right side from stroke.       Has 2 children.  Living with sister here in [place].     Family History  Problem Relation Age of Onset  . Stroke Mother   . Liver cancer Father     ??? with alcohol abuse  . Colon cancer Paternal Uncle   . Heart disease Sister   . Colon cancer Paternal Aunt     ROS  Per HPI   Objective   BP 138/73 mmHg  Pulse 71  Temp(Src) 98.4 F (36.9 C) (Oral)  Ht 5\' 5"  (1.651 m)  Wt 146 lb 3.2 oz (66.316 kg)  BMI 24.33 kg/m2  General: Well appearing, no distress HEENT:   Head:  Normocephalic  Eyes: Pupils equal and reactive to light/accomodation. Extraocular movements intact bilaterally.  Ears: Tympanic membranes normal bilaterally.  Nose/Throat: Nares patent bilaterally. Oropharnx clear and moist. Poor dentition  Neck: No cervical adenopathy bilaterally Respiratory/Chest: Clear to auscultation bilaterally. Unlabored work of breathing. No wheezing or rales. Cardiovascular: Regular rate and rhythm. Normal S1 and S2. No heart murmurs present. No extra heart sounds Gastrointestinal: Soft, non-tender, non-distended, no guarding, no rebound, no masses felt Genitourinary: No external lesions.  Vagina slightly atrophic. Cervix visualized. Attempted pap smear, but could not advance brush into os    Musculoskeletal: normal bulk Neuro: Alert, oriented Dermatologic: Mold on left cheek that is hyperpigmented and waxy Psychiatric: Slightly flat affect, normal speech  Assessment and Plan    No orders of the defined types were placed in this encounter.    Health Maintenance Due  Topic Date Due  . MAMMOGRAM  08/22/2004  . PAP SMEAR  03/22/2008  . ZOSTAVAX  08/22/2014  .  TETANUS/TDAP  02/21/2015    Labs:  Orders Placed This Encounter  Procedures  . CBC with Differential  . COMPLETE METABOLIC PANEL WITH GFR  . TSH  . LDL cholesterol, direct  . POCT glycosylated hemoglobin (Hb A1C)    Associate with Z13.1   Pap smear for cervical cancer screening - Ambulatory referral to Gynecology  Healthcare maintenance - mammogram referral already in place

## 2016-02-26 ENCOUNTER — Other Ambulatory Visit: Payer: Medicaid Other

## 2016-02-26 ENCOUNTER — Telehealth: Payer: Self-pay | Admitting: Pharmacy Technician

## 2016-02-26 DIAGNOSIS — B182 Chronic viral hepatitis C: Secondary | ICD-10-CM

## 2016-02-26 LAB — COMPLETE METABOLIC PANEL WITH GFR
ALBUMIN: 4.3 g/dL (ref 3.6–5.1)
ALK PHOS: 45 U/L (ref 33–130)
ALT: 13 U/L (ref 6–29)
AST: 17 U/L (ref 10–35)
BILIRUBIN TOTAL: 0.6 mg/dL (ref 0.2–1.2)
BUN: 10 mg/dL (ref 7–25)
CALCIUM: 10.8 mg/dL — AB (ref 8.6–10.4)
CO2: 34 mmol/L — ABNORMAL HIGH (ref 20–31)
Chloride: 100 mmol/L (ref 98–110)
Creat: 0.72 mg/dL (ref 0.50–0.99)
GFR, Est African American: >89 mL/min (ref >=60)
GLUCOSE: 80 mg/dL (ref 65–99)
Potassium: 3.7 mmol/L (ref 3.5–5.3)
SODIUM: 142 mmol/L (ref 135–146)
TOTAL PROTEIN: 7.8 g/dL (ref 6.1–8.1)

## 2016-02-26 LAB — LDL CHOLESTEROL, DIRECT: Direct LDL: 130 mg/dL — ABNORMAL HIGH (ref <130)

## 2016-02-27 LAB — HEPATITIS C RNA QUANTITATIVE: HCV QUANT: NOT DETECTED [IU]/mL (ref <15)

## 2016-03-03 ENCOUNTER — Encounter: Payer: Self-pay | Admitting: Family Medicine

## 2016-03-06 ENCOUNTER — Encounter: Payer: Self-pay | Admitting: Obstetrics and Gynecology

## 2016-03-17 MED FILL — HARVONI 90-400 MG TABLET: 90-400 | 28 days supply | Qty: 28 | Fill #2

## 2016-04-01 ENCOUNTER — Ambulatory Visit: Payer: Medicaid Other | Admitting: Internal Medicine

## 2016-06-19 ENCOUNTER — Encounter: Payer: Self-pay | Admitting: Internal Medicine

## 2016-06-19 ENCOUNTER — Ambulatory Visit (INDEPENDENT_AMBULATORY_CARE_PROVIDER_SITE_OTHER): Payer: Medicaid Other | Admitting: Internal Medicine

## 2016-06-19 VITALS — BP 129/74 | HR 62 | Temp 97.7°F | Wt 146.0 lb

## 2016-06-19 DIAGNOSIS — K7400 Hepatic fibrosis, unspecified: Secondary | ICD-10-CM

## 2016-06-19 DIAGNOSIS — B182 Chronic viral hepatitis C: Secondary | ICD-10-CM

## 2016-06-19 NOTE — Assessment & Plan Note (Signed)
End of treatment viral load today and rtc 3 months for [facility]

## 2016-06-19 NOTE — Assessment & Plan Note (Signed)
Repeat elastography reasurring.  No inication for [place] screening.

## 2016-06-19 NOTE — Progress Notes (Signed)
   Subjective:    Patient ID: [name], female    DOB: [birth year], 60 y.o.   MRN: [id number]  HPI Here for follow up of HCV. Has genotype 1b, viral load of 3 million, elastography F1/2 and F2/3.  Completed 8 weeks of Harvoni.  Early viral load undetectable. Tolerated well.     Review of Systems  Constitutional: Negative for fatigue.  Skin: Negative for rash.  Neurological: Negative for dizziness and light-headedness.       Objective:   Physical Exam  Constitutional: She appears well-developed and well-nourished.  Eyes: No scleral icterus.  Cardiovascular: Normal rate, regular rhythm and normal heart sounds.   Pulmonary/Chest: Effort normal and breath sounds normal. No respiratory distress.  Skin: No rash noted.          Assessment & Plan:

## 2016-06-20 LAB — HEPATITIS C RNA QUANTITATIVE: HCV QUANT: NOT DETECTED [IU]/mL (ref <15)

## 2016-10-02 ENCOUNTER — Ambulatory Visit: Payer: Medicaid Other | Admitting: Internal Medicine

## 2016-10-28 ENCOUNTER — Telehealth: Payer: Self-pay | Admitting: *Deleted

## 2016-10-28 ENCOUNTER — Ambulatory Visit: Payer: Medicaid Other | Admitting: Internal Medicine

## 2016-10-28 NOTE — Telephone Encounter (Signed)
Sure.  Why don't we just do SVR 24 in FEbruary instead and if ok then, no need to come back here. Does not need liver follow up.  thanks

## 2016-10-28 NOTE — Telephone Encounter (Signed)
Patient cancelled today's appointment due to lack of transportation from [place].   She is wondering if she can have her SVR labs drawn at [facility].  Please advise how to place these orders for release if appropriate and how she should follow up. Landis Gandy, RN

## 2016-11-04 NOTE — Telephone Encounter (Signed)
Orders faxed to [facility]. Patient will not need an appointment, can walk in any time. Patient notified to go to [facility] in February for lab draw. She verbalized understanding. Landis Gandy, RN

## 2016-12-16 NOTE — Telephone Encounter (Signed)
Refaxed orders to [facility] for viral load. [phone].

## 2016-12-30 NOTE — Telephone Encounter (Signed)
l °

## 2017-01-02 ENCOUNTER — Other Ambulatory Visit: Payer: Self-pay | Admitting: Internal Medicine

## 2017-01-07 LAB — HEPATITIS C RNA QUANTITATIVE
HCV QUANT: NOT DETECTED [IU]/mL
HCV Quantitative Log: <1.18 Log IU/mL

## 2017-03-09 ENCOUNTER — Ambulatory Visit: Payer: Medicaid Other | Admitting: Internal Medicine

## 2017-03-09 ENCOUNTER — Telehealth: Payer: Self-pay | Admitting: *Deleted

## 2017-03-09 NOTE — Telephone Encounter (Signed)
Left patient a voice mail per Dr. Linus Salmons her labs are negative. She does not need to keep her follow up appointment with MD. If she shows up tomorrow she should see pharmacy. Myrtis Hopping CMA

## 2017-03-10 ENCOUNTER — Ambulatory Visit (INDEPENDENT_AMBULATORY_CARE_PROVIDER_SITE_OTHER): Payer: Medicaid Other | Admitting: Pharmacist Clinician (PhC)/ Clinical Pharmacy Specialist

## 2017-03-10 ENCOUNTER — Ambulatory Visit: Payer: Medicaid Other | Admitting: Internal Medicine

## 2017-03-10 DIAGNOSIS — B182 Chronic viral hepatitis C: Secondary | ICD-10-CM

## 2017-03-10 NOTE — Progress Notes (Signed)
HPI: [name] is a 61 y.o. female who is here for her hep C cure visit.   Lab Results  Component Value Date   HCVGENOTYPE 1b 11/20/2015    Allergies: No Known Allergies  Vitals:    Past Medical History: Past Medical History:  Diagnosis Date  . Adjustment disorder with depressed mood 01/11/2009  . Arthritis   . Asthma   . CAD 07/10/2008  . Chronic back pain   . COCAINE DEPENDENCY NOS 01/14/2007  . CONVULSIONS, SEIZURES, NOS 01/14/2007   last seizure May, 2016  . COPD (chronic obstructive pulmonary disease) ([name])   . Coronary artery disease 01/14/2012  . CVA 01/14/2007  . Family history of anesthesia complication 5027   Pts sister died after having anesthesia  . Glaucoma of both eyes   . Headache(784.0)   . HEPATITIS C 02/26/2009  . Hepatitis C   . Hyperlipemia   . HYPERTENSION, BENIGN SYSTEMIC 01/14/2007  . Memory loss    d/t CVA  . MI (myocardial infarction) 12/20/2007   had stent placed  . Pneumonia    hx of  . PTSD (post-traumatic stress disorder)   . PTSD (post-traumatic stress disorder)   . Right sided weakness    d/t stroke  . Sciatica   . Stroke [facility])    right side weakness  . TOBACCO DEPENDENCE 01/14/2007    Social History: Social History   Social History  . Marital status: Single    Spouse name: N/A  . Number of children: N/A  . Years of education: N/A   Social History Main Topics  . Smoking status: Current Every Day Smoker    Packs/day: 0.25    Types: Cigarettes  . Smokeless tobacco: Never Used  . Alcohol use No     Comment: quit 4 years ago  . Drug use: No     Comment: last use of cocaine 6 years ago  . Sexual activity: No   Other Topics Concern  . Not on file   Social History Narrative   On disability- due to CVA/ PTSD and MI-- weak on right side from stroke.       Has 2 children.  Living with sister here in [place].     Labs: Hep B S Ab (no units)  Date Value  06/15/2014 NEG   Hepatitis B Surface Ag (no units)  Date Value   06/15/2014 NEGATIVE   HCV Ab (no units)  Date Value  02/16/2009 REACTIVE (A)    Lab Results  Component Value Date   HCVGENOTYPE 1b 11/20/2015    Hepatitis C RNA quantitative Latest Ref Rng & Units 01/02/2017 06/19/2016 02/26/2016 11/20/2015 05/30/2014  HCV Quantitative NOT DETECTED IU/mL <15 NOT DETECTED Not Detected Not Detected 7,412,878(M) 544,848(H)  HCV Quantitative Log NOT DETECTED Log IU/mL <1.18 NOT DETECTED NOT CALC NOT CALC 6.34(H) 5.74(H)    AST (U/L)  Date Value  02/25/2016 17  07/11/2015 28  05/30/2014 25   ALT (U/L)  Date Value  02/25/2016 13  07/11/2015 22  05/30/2014 18   INR (no units)  Date Value  06/15/2014 1.13  05/16/2009 1.1  12/20/2007 1.1    CrCl: CrCl cannot be calculated (Patient's most recent lab result is older than the maximum 21 days allowed.).  Fibrosis Score: F2/3 as assessed by [facility]  Child-Pugh Score: Class A  Previous Treatment Regimen: None  Assessment: [name] finished her treatment with Harvoni around 6/17. All of her VLs have been neg. Her SVR24 was also neg in  2/18. She was very excited to hear about it. She didn't know what the reason for the visit today until I told her that this is the cure visit. She was very glad about the results. Explained the fibrosis score to her again. She will no longer need to follow up with [place].   Recommendations:  Cured of her hep C No f/u needed  [name], [place].D., BCPS, AAHIVP Clinical Infectious [place] [facility] 03/10/2017, 10:12 AM

## 2017-03-10 NOTE — Patient Instructions (Signed)
You are cure now for hepatitis C You do not need to see [place] anymore

## 2017-03-11 ENCOUNTER — Ambulatory Visit: Payer: Medicaid Other

## 2017-03-14 ENCOUNTER — Emergency Department (HOSPITAL_COMMUNITY)
Admission: EM | Admit: 2017-03-14 | Discharge: 2017-03-14 | Disposition: A | Discharge status: Home / Self Care | Payer: Medicaid Other | Attending: Emergency Medicine | Admitting: Emergency Medicine

## 2017-03-14 ENCOUNTER — Encounter (HOSPITAL_COMMUNITY): Payer: Self-pay | Admitting: *Deleted

## 2017-03-14 ENCOUNTER — Emergency Department (HOSPITAL_COMMUNITY): Payer: Medicaid Other

## 2017-03-14 DIAGNOSIS — M6281 Muscle weakness (generalized): Secondary | ICD-10-CM | POA: Diagnosis not present

## 2017-03-14 DIAGNOSIS — J45909 Unspecified asthma, uncomplicated: Secondary | ICD-10-CM | POA: Insufficient documentation

## 2017-03-14 DIAGNOSIS — Z7982 Long term (current) use of aspirin: Secondary | ICD-10-CM | POA: Insufficient documentation

## 2017-03-14 DIAGNOSIS — Z79899 Other long term (current) drug therapy: Secondary | ICD-10-CM | POA: Insufficient documentation

## 2017-03-14 DIAGNOSIS — R072 Precordial pain: Secondary | ICD-10-CM | POA: Insufficient documentation

## 2017-03-14 DIAGNOSIS — I2510 Atherosclerotic heart disease of native coronary artery without angina pectoris: Secondary | ICD-10-CM | POA: Insufficient documentation

## 2017-03-14 DIAGNOSIS — R0602 Shortness of breath: Secondary | ICD-10-CM | POA: Insufficient documentation

## 2017-03-14 DIAGNOSIS — R079 Chest pain, unspecified: Secondary | ICD-10-CM

## 2017-03-14 DIAGNOSIS — F17210 Nicotine dependence, cigarettes, uncomplicated: Secondary | ICD-10-CM | POA: Diagnosis not present

## 2017-03-14 DIAGNOSIS — J449 Chronic obstructive pulmonary disease, unspecified: Secondary | ICD-10-CM | POA: Diagnosis not present

## 2017-03-14 DIAGNOSIS — R002 Palpitations: Secondary | ICD-10-CM | POA: Insufficient documentation

## 2017-03-14 LAB — TROPONIN I
Troponin I: <0.03 ng/mL (ref <0.03)
Troponin I: <0.03 ng/mL (ref <0.03)

## 2017-03-14 LAB — D-DIMER, QUANTITATIVE: D-Dimer, Quant: 0.41 ug/mL-FEU (ref 0.00–0.50)

## 2017-03-14 LAB — RAPID URINE DRUG SCREEN, HOSP PERFORMED
Amphetamines: NOT DETECTED
BARBITURATES: NOT DETECTED
BENZODIAZEPINES: NOT DETECTED
COCAINE: NOT DETECTED
Opiates: NOT DETECTED
TETRAHYDROCANNABINOL: POSITIVE — AB

## 2017-03-14 LAB — CBC
HCT: 32.7 % — ABNORMAL LOW (ref 36.0–46.0)
HEMOGLOBIN: 11.5 g/dL — AB (ref 12.0–15.0)
MCH: 29.8 pg (ref 26.0–34.0)
MCHC: 35.2 g/dL (ref 30.0–36.0)
MCV: 84.7 fL (ref 78.0–100.0)
Platelets: 238 10*3/uL (ref 150–400)
RBC: 3.86 MIL/uL — ABNORMAL LOW (ref 3.87–5.11)
RDW: 13.1 % (ref 11.5–15.5)
WBC: 8.5 10*3/uL (ref 4.0–10.5)

## 2017-03-14 LAB — BASIC METABOLIC PANEL
ANION GAP: 10 (ref 5–15)
BUN: 13 mg/dL (ref 6–20)
CALCIUM: 9.2 mg/dL (ref 8.9–10.3)
CO2: 26 mmol/L (ref 22–32)
Chloride: 103 mmol/L (ref 101–111)
Creatinine, Ser: 0.89 mg/dL (ref 0.44–1.00)
GFR calc Af Amer: >60 mL/min (ref >60)
GFR calc non Af Amer: >60 mL/min (ref >60)
Glucose, Bld: 121 mg/dL — ABNORMAL HIGH (ref 65–99)
POTASSIUM: 2.9 mmol/L — AB (ref 3.5–5.1)
Sodium: 139 mmol/L (ref 135–145)

## 2017-03-14 MED ORDER — GI COCKTAIL ~~LOC~~
30.0000 mL | Freq: Once | ORAL | Status: AC
  Administered 2017-03-14: 30 mL via ORAL
  Filled 2017-03-14: qty 30

## 2017-03-14 MED ORDER — POTASSIUM CHLORIDE CRYS ER 20 MEQ PO TBCR
40.0000 meq | EXTENDED_RELEASE_TABLET | Freq: Once | ORAL | Status: AC
  Administered 2017-03-14: 40 meq via ORAL
  Filled 2017-03-14: qty 2

## 2017-03-14 MED ORDER — ASPIRIN 81 MG PO CHEW
324.0000 mg | CHEWABLE_TABLET | Freq: Once | ORAL | Status: AC
  Administered 2017-03-14: 324 mg via ORAL
  Filled 2017-03-14: qty 4

## 2017-03-14 NOTE — Discharge Instructions (Signed)
Your blood tests are negative for any damage to your heart today. Follow up with Dr. Terrence Dupont on Monday for a stress test. Return to the ED if you develop new or worsening symptoms.

## 2017-03-14 NOTE — ED Triage Notes (Signed)
Pt states a chest pressure that started about 2 hours ago. Pt took 2 nitro w/o relief.

## 2017-03-14 NOTE — ED Provider Notes (Signed)
[place] DEPT Provider Note   CSN: [id number] Arrival date & time: 03/14/17  0010  By signing my name below, I, Oleh Genin, attest that this documentation has been prepared under the direction and in the presence of Ezequiel Essex, MD. Electronically Signed: Oleh Genin, Scribe. 03/14/17. 12:29 AM.   History   Chief Complaint Chief Complaint  Patient presents with  . Chest Pain    HPI [name] is a 60 y.o. female with history of HTN, HLD, CAD with stenting, prior CVA with R sided deficits, and COPD who presents to the ED for evaluation of chest pain. This patient states that she was sitting a chair at home when she had sudden onset of "pounding" heart palpitations with paresthesias/numbness in the L arm.  This was followed by "soreness" in her chest. Pain is constant. EMS gave 2x nitro which improved her symptoms. At interview, she is also reporting some dyspnea as well as overlying L sided chest "soreness" that radiates into the back. Her palpitations have resolved. Her back pain worsens when moving the L arm. She denies any nausea, diaphoresis, or near syncope today. Denies cocaine use.   The history is provided by the patient. No language interpreter was used.  Chest Pain   This is a new problem. The current episode started 1 to 2 hours ago. The problem occurs constantly. The problem has not changed since onset.The pain is present in the substernal region. The pain is severe. Quality: "sore" The pain radiates to the upper back. Associated symptoms include palpitations and shortness of breath. Pertinent negatives include no nausea.    Past Medical History:  Diagnosis Date  . Adjustment disorder with depressed mood 01/11/2009  . Arthritis   . Asthma   . CAD 07/10/2008  . Chronic back pain   . COCAINE DEPENDENCY NOS 01/14/2007  . CONVULSIONS, SEIZURES, NOS 01/14/2007   last seizure May, 2016  . COPD (chronic obstructive pulmonary disease) ([place])   . Coronary artery  disease 01/14/2012  . CVA 01/14/2007  . Family history of anesthesia complication 5993   Pts sister died after having anesthesia  . Glaucoma of both eyes   . Headache(784.0)   . HEPATITIS C 02/26/2009  . Hepatitis C   . Hyperlipemia   . HYPERTENSION, BENIGN SYSTEMIC 01/14/2007  . Memory loss    d/t CVA  . MI (myocardial infarction) ([place]) 12/20/2007   had stent placed  . Pneumonia    hx of  . PTSD (post-traumatic stress disorder)   . PTSD (post-traumatic stress disorder)   . Right sided weakness    d/t stroke  . Sciatica   . Stroke [facility])    right side weakness  . TOBACCO DEPENDENCE 01/14/2007    Patient Active Problem List   Diagnosis Date Noted  . Liver fibrosis 11/20/2015  . Chronic hepatitis C without hepatic coma ([place]) 07/25/2014  . Hyperkalemia 05/30/2014  . Back pain 05/30/2014  . Flank pain 12/02/2012  . Hip pain, left 05/27/2012  . Vision changes 04/08/2012  . Special screening for malignant neoplasms, colon 01/14/2012  . Viral URI with cough 12/25/2011  . Healthcare maintenance 12/15/2011  . Hyperlipidemia 12/13/2011  . ADJUSTMENT DISORDER WITH DEPRESSED MOOD 01/11/2009  . MILD COGNITIVE IMPAIRMENT SO STATED 07/10/2008  . CAD 07/10/2008  . TOBACCO DEPENDENCE 01/14/2007  . HYPERTENSION, BENIGN SYSTEMIC 01/14/2007  . CVA 01/14/2007  . CONVULSIONS, SEIZURES, NOS 01/14/2007    Past Surgical History:  Procedure Laterality Date  . CATARACT EXTRACTION W/PHACO Right  07/11/2015   Procedure: CATARACT EXTRACTION PHACO AND INTRAOCULAR LENS PLACEMENT ([place]) RIGHT,INTERNAL PLEB REVISION;  Surgeon: Marylynn Pearson, MD;  Location: [place];  Service: Ophthalmology;  Laterality: Right;  . CESAREAN SECTION     x 2  . COLONOSCOPY W/ POLYPECTOMY    . CORONARY ANGIOPLASTY WITH STENT PLACEMENT  2009   1 stent  . EYE SURGERY    . MITOMYCIN C APPLICATION Right 73/53/2992   Procedure: MITOMYCIN C APPLICATION RIGHT EYE;  Surgeon: Marylynn Pearson, MD;  Location: [place];  Service: Ophthalmology;   Laterality: Right;  . TONSILLECTOMY    . TRABECULECTOMY Left 06/29/2013   Procedure: TRABECULECTOMY WITH MYTOMICIN C LEFT EYE;  Surgeon: Marylynn Pearson, MD;  Location: [place];  Service: Ophthalmology;  Laterality: Left;  . TRABECULECTOMY Right 09/07/2013   Procedure: TRABECULECTOMY RIGHT EYE;  Surgeon: Marylynn Pearson, MD;  Location: [place];  Service: Ophthalmology;  Laterality: Right;    OB History    No data available       Home Medications    Prior to Admission medications   Medication Sig Start Date End Date Taking? Authorizing Provider  albuterol (PROVENTIL HFA;VENTOLIN HFA) 108 (90 BASE) MCG/ACT inhaler Inhale 2 puffs into the lungs every 6 (six) hours as needed for wheezing. For wheezing   Yes Historical Provider, MD  amLODipine (NORVASC) 5 MG tablet Take 10 mg by mouth every morning.    Yes Historical Provider, MD  aspirin 81 MG EC tablet Take 81 mg by mouth daily.     Yes Historical Provider, MD  atorvastatin (LIPITOR) 40 MG tablet Take 40 mg by mouth every morning.    Yes Historical Provider, MD  carvedilol (COREG) 12.5 MG tablet Take 12.5 mg by mouth 2 (two) times daily with a meal.   Yes Historical Provider, MD  clopidogrel (PLAVIX) 75 MG tablet Take 75 mg by mouth every morning.    Yes Historical Provider, MD  gatifloxacin (ZYMAXID) 0.5 % SOLN 1 drop.   Yes Historical Provider, MD  ketorolac (ACULAR) 0.5 % ophthalmic solution 1 drop 4 (four) times daily.   Yes Historical Provider, MD  lisinopril-hydrochlorothiazide (PRINZIDE,ZESTORETIC) 20-12.5 MG per tablet Take 1 tablet by mouth every morning.   Yes Historical Provider, MD  nitroGLYCERIN (NITROSTAT) 0.4 MG SL tablet Place 0.4 mg under the tongue every 5 (five) minutes as needed. x3 doses as needed for chest pain   Yes Historical Provider, MD  tobramycin-dexamethasone (TOBRADEX) ophthalmic ointment 1 application 3 (three) times daily.    Historical Provider, MD    Family History Family History  Problem Relation Age of Onset  .  Stroke Mother   . Liver cancer Father     ??? with alcohol abuse  . Colon cancer Paternal Uncle   . Heart disease Sister   . Colon cancer Paternal Aunt     Social History Social History  Substance Use Topics  . Smoking status: Current Every Day Smoker    Packs/day: 0.25    Types: Cigarettes  . Smokeless tobacco: Never Used  . Alcohol use No     Comment: quit 4 years ago     Allergies   Patient has no known allergies.   Review of Systems Review of Systems  Respiratory: Positive for shortness of breath.   Cardiovascular: Positive for chest pain and palpitations.  Gastrointestinal: Negative for nausea.  All other systems reviewed and are negative.    Physical Exam Updated Vital Signs BP 117/78 (BP Location: Right Arm)   Pulse 71  Temp 98.5 F (36.9 C) (Oral)   Resp 20   Ht 5\' 4"  (1.626 m)   Wt 138 lb (62.6 kg)   SpO2 98%   BMI 23.69 kg/m   Physical Exam  Constitutional: She is oriented to person, place, and time. She appears well-developed and well-nourished. No distress.  HENT:  Head: Normocephalic and atraumatic.  Mouth/Throat: Oropharynx is clear and moist. No oropharyngeal exudate.  Eyes: Conjunctivae and EOM are normal. Pupils are equal, round, and reactive to light.  Neck: Normal range of motion. Neck supple.  No meningismus.  Cardiovascular: Normal rate, regular rhythm, normal heart sounds and intact distal pulses.   No murmur heard. Pulmonary/Chest: Effort normal and breath sounds normal. No respiratory distress.  L sided chest wall tenderness.   Abdominal: Soft. There is no tenderness. There is no rebound and no guarding.  Musculoskeletal: Normal range of motion. She exhibits no edema or tenderness.  Neurological: She is alert and oriented to person, place, and time. No cranial nerve deficit. She exhibits normal muscle tone. Coordination normal.  CN 2-12 intact. Weakness in the R arm and R lower extremity which are reportedly baseline.   Skin: Skin  is warm.  Psychiatric: She has a normal mood and affect. Her behavior is normal.  Nursing note and vitals reviewed.    ED Treatments / Results  Labs (all labs ordered are listed, but only abnormal results are displayed) Labs Reviewed  BASIC METABOLIC PANEL - Abnormal; Notable for the following:       Result Value   Potassium 2.9 (*)    Glucose, Bld 121 (*)    All other components within normal limits  CBC - Abnormal; Notable for the following:    RBC 3.86 (*)    Hemoglobin 11.5 (*)    HCT 32.7 (*)    All other components within normal limits  RAPID URINE DRUG SCREEN, HOSP PERFORMED - Abnormal; Notable for the following:    Tetrahydrocannabinol POSITIVE (*)    All other components within normal limits  TROPONIN I  TROPONIN I  D-DIMER, QUANTITATIVE (NOT AT [facility])  TROPONIN I    EKG  EKG Interpretation  Date/Time:  Saturday March 14 2017 05:54:17 EDT Ventricular Rate:  62 PR Interval:    QRS Duration: 96 QT Interval:  420 QTC Calculation: 427 R Axis:   76 Text Interpretation:  Sinus rhythm No significant change was found Confirmed by Wyvonnia Dusky  MD, Vickie Ponds [phone]) on 03/14/2017 6:05:22 AM       Radiology Dg Chest 2 View  Result Date: 03/14/2017 CLINICAL DATA:  Acute onset R palpitations, LEFT chest soreness. EXAM: CHEST  2 VIEW COMPARISON:  Chest radiograph May 12, 2014 FINDINGS: Cardiomediastinal silhouette is normal. No pleural effusions or focal consolidations. Trachea projects midline and there is no pneumothorax. Soft tissue planes and included osseous structures are non-suspicious. IMPRESSION: Stable examination:  No acute cardiopulmonary process. Electronically Signed   By: Elon Alas M.D.   On: 03/14/2017 01:35    Procedures Procedures (including critical care time)  Medications Ordered in ED Medications - No data to display   Initial Impression / Assessment and Plan / ED Course  I have reviewed the triage vital signs and the nursing  notes.  Pertinent labs & imaging results that were available during my care of the patient were reviewed by me and considered in my medical decision making (see chart for details).     Patient with episode of palpitations, racing heart, chest pressure and soreness  that onset about 2 hours prior to presentation. Took nitroglycerin without relief. Her initial complaint was "pounding in the chest" as well as "soreness in the chest".  EKG without acute ST changes. Troponin negative. Troponin negative x2.  D-dimer negative. UDS negative for cocaine.    [place] 2009 FINDINGS:  LV showed good LV systolic function.  There was mild inferior  wall hypokinesia, EF of 50-55% left main was patent.  LAD has 10-15%  proximal and mid stenosis.  Diagonal one was small, diagonal two has 30-  40% proximal stenosis, this vessel was also small.  Left circumflex has  15-20% mid stenosis.  OM1 to OM3 were very, very small.  OM4 has 30-40%  stenosis.  RCA has proximal and mid junction 99% stenosis and 30-40% mid  stenosis.  PDA is very small.  PLV branches are patent. RCA was stented.  Last stress 2012 in [place]. Patient feels improved in the ED. Chest wall is sore to palpation. No further episodes of palpitations or pressure.  EKG nsr. Troponin remains negative. D/w Dr. Terrence Dupont who agrees that she can be discharged and he will see in office on 4/30. MI ruled out. Doubt PE, doubt aortic dissection. 6 hour troponin negative. Return to the ED with worsening symptoms.  Final Clinical Impressions(s) / ED Diagnoses   Final diagnoses:  Nonspecific chest pain    New Prescriptions New Prescriptions   No medications on file  I personally performed the services described in this documentation, which was scribed in my presence. The recorded information has been reviewed and is accurate.    Ezequiel Essex, MD 03/14/17 [phone]

## 2017-03-17 ENCOUNTER — Other Ambulatory Visit: Payer: Self-pay | Admitting: Cardiology

## 2017-03-17 DIAGNOSIS — R079 Chest pain, unspecified: Secondary | ICD-10-CM

## 2017-04-06 ENCOUNTER — Encounter (HOSPITAL_COMMUNITY)
Admission: RE | Admit: 2017-04-06 | Discharge: 2017-04-06 | Disposition: A | Discharge status: Home / Self Care | Payer: Medicaid Other | Source: Ambulatory Visit | Attending: Cardiology | Admitting: Cardiology

## 2017-04-06 DIAGNOSIS — R079 Chest pain, unspecified: Secondary | ICD-10-CM

## 2017-04-06 MED ORDER — REGADENOSON 0.4 MG/5ML IV SOLN
0.4000 mg | Freq: Once | INTRAVENOUS | Status: AC
  Administered 2017-04-06: 0.4 mg via INTRAVENOUS

## 2017-04-06 MED ORDER — REGADENOSON 0.4 MG/5ML IV SOLN
INTRAVENOUS | Status: AC
  Administered 2017-04-06: 0.4 mg via INTRAVENOUS
  Filled 2017-04-06: qty 5

## 2017-04-06 MED ORDER — TECHNETIUM TC 99M TETROFOSMIN IV KIT
10.0000 | PACK | Freq: Once | INTRAVENOUS | Status: AC | PRN
  Administered 2017-04-06: 10 via INTRAVENOUS

## 2017-04-06 MED ORDER — TECHNETIUM TC 99M TETROFOSMIN IV KIT
30.0000 | PACK | Freq: Once | INTRAVENOUS | Status: AC | PRN
  Administered 2017-04-06: 30 via INTRAVENOUS

## 2017-07-29 DIAGNOSIS — J449 Chronic obstructive pulmonary disease, unspecified: Secondary | ICD-10-CM | POA: Diagnosis not present

## 2017-07-29 DIAGNOSIS — Z955 Presence of coronary angioplasty implant and graft: Secondary | ICD-10-CM | POA: Diagnosis not present

## 2017-07-29 DIAGNOSIS — Z79899 Other long term (current) drug therapy: Secondary | ICD-10-CM | POA: Diagnosis not present

## 2017-07-29 DIAGNOSIS — N644 Mastodynia: Secondary | ICD-10-CM | POA: Diagnosis present

## 2017-07-29 DIAGNOSIS — F17210 Nicotine dependence, cigarettes, uncomplicated: Secondary | ICD-10-CM | POA: Diagnosis not present

## 2017-07-29 DIAGNOSIS — J45909 Unspecified asthma, uncomplicated: Secondary | ICD-10-CM | POA: Diagnosis not present

## 2017-07-29 DIAGNOSIS — N6452 Nipple discharge: Secondary | ICD-10-CM | POA: Diagnosis not present

## 2017-07-29 DIAGNOSIS — I2510 Atherosclerotic heart disease of native coronary artery without angina pectoris: Secondary | ICD-10-CM | POA: Diagnosis not present

## 2017-07-29 DIAGNOSIS — Z7902 Long term (current) use of antithrombotics/antiplatelets: Secondary | ICD-10-CM | POA: Diagnosis not present

## 2017-07-29 DIAGNOSIS — N6342 Unspecified lump in left breast, subareolar: Secondary | ICD-10-CM | POA: Insufficient documentation

## 2017-07-29 DIAGNOSIS — I10 Essential (primary) hypertension: Secondary | ICD-10-CM | POA: Insufficient documentation

## 2017-07-29 DIAGNOSIS — Z7982 Long term (current) use of aspirin: Secondary | ICD-10-CM | POA: Insufficient documentation

## 2017-07-30 ENCOUNTER — Encounter (HOSPITAL_COMMUNITY): Payer: Self-pay | Admitting: Emergency Medicine

## 2017-07-30 ENCOUNTER — Emergency Department (HOSPITAL_COMMUNITY)
Admission: EM | Admit: 2017-07-30 | Discharge: 2017-07-30 | Disposition: A | Discharge status: Home / Self Care | Payer: Medicaid Other | Attending: Emergency Medicine | Admitting: Emergency Medicine

## 2017-07-30 ENCOUNTER — Encounter: Payer: Self-pay | Admitting: Family Medicine

## 2017-07-30 DIAGNOSIS — T502X5A Adverse effect of carbonic-anhydrase inhibitors, benzothiadiazides and other diuretics, initial encounter: Secondary | ICD-10-CM

## 2017-07-30 DIAGNOSIS — R634 Abnormal weight loss: Secondary | ICD-10-CM

## 2017-07-30 DIAGNOSIS — I10 Essential (primary) hypertension: Secondary | ICD-10-CM

## 2017-07-30 DIAGNOSIS — N6320 Unspecified lump in the left breast, unspecified quadrant: Secondary | ICD-10-CM

## 2017-07-30 DIAGNOSIS — E876 Hypokalemia: Secondary | ICD-10-CM

## 2017-07-30 LAB — CBC WITH DIFFERENTIAL/PLATELET
BASOS ABS: 0 10*3/uL (ref 0.0–0.1)
Basophils Relative: 0 %
EOS PCT: 1 %
Eosinophils Absolute: 0.1 10*3/uL (ref 0.0–0.7)
HCT: 35 % — ABNORMAL LOW (ref 36.0–46.0)
Hemoglobin: 12.6 g/dL (ref 12.0–15.0)
LYMPHS PCT: 48 %
Lymphs Abs: 4.3 10*3/uL — ABNORMAL HIGH (ref 0.7–4.0)
MCH: 30 pg (ref 26.0–34.0)
MCHC: 36 g/dL (ref 30.0–36.0)
MCV: 83.3 fL (ref 78.0–100.0)
MONO ABS: 0.7 10*3/uL (ref 0.1–1.0)
MONOS PCT: 7 %
Neutro Abs: 4 10*3/uL (ref 1.7–7.7)
Neutrophils Relative %: 44 %
PLATELETS: 241 10*3/uL (ref 150–400)
RBC: 4.2 MIL/uL (ref 3.87–5.11)
RDW: 13.1 % (ref 11.5–15.5)
WBC: 9.1 10*3/uL (ref 4.0–10.5)

## 2017-07-30 LAB — COMPREHENSIVE METABOLIC PANEL
ALBUMIN: 4.4 g/dL (ref 3.5–5.0)
ALK PHOS: 46 U/L (ref 38–126)
ALT: 12 U/L — AB (ref 14–54)
AST: 20 U/L (ref 15–41)
Anion gap: 10 (ref 5–15)
BILIRUBIN TOTAL: 0.6 mg/dL (ref 0.3–1.2)
BUN: 7 mg/dL (ref 6–20)
CALCIUM: 9.8 mg/dL (ref 8.9–10.3)
CO2: 29 mmol/L (ref 22–32)
Chloride: 101 mmol/L (ref 101–111)
Creatinine, Ser: 0.7 mg/dL (ref 0.44–1.00)
GFR calc Af Amer: >60 mL/min (ref >60)
GFR calc non Af Amer: >60 mL/min (ref >60)
Glucose, Bld: 111 mg/dL — ABNORMAL HIGH (ref 65–99)
Potassium: 2.7 mmol/L — CL (ref 3.5–5.1)
SODIUM: 140 mmol/L (ref 135–145)
Total Protein: 7.6 g/dL (ref 6.5–8.1)

## 2017-07-30 MED ORDER — POTASSIUM CHLORIDE 10 MEQ/100ML IV SOLN
10.0000 meq | Freq: Once | INTRAVENOUS | Status: AC
  Administered 2017-07-30: 10 meq via INTRAVENOUS
  Filled 2017-07-30: qty 100

## 2017-07-30 MED ORDER — POTASSIUM CHLORIDE CRYS ER 20 MEQ PO TBCR
40.0000 meq | EXTENDED_RELEASE_TABLET | Freq: Once | ORAL | Status: AC
  Administered 2017-07-30: 40 meq via ORAL
  Filled 2017-07-30: qty 2

## 2017-07-30 MED ORDER — POTASSIUM CHLORIDE CRYS ER 20 MEQ PO TBCR: 20.0000 meq | EXTENDED_RELEASE_TABLET | Freq: Two times a day (BID) | ORAL | 0 refills | Status: DC

## 2017-07-30 NOTE — Discharge Instructions (Addendum)
Make sure that you go for your mammogram, and follow up with your doctor at the [facility]. You will likely need to have a biopsy of the mass, and may need to see a surgeon and an oncologist (cancer specialist).

## 2017-07-30 NOTE — Progress Notes (Signed)
Blue team,  Please contact patient to schedule wellness exam soon with her PCP. Also advise her to call breast imaging center for mammogram as she is pass due. Thank you.  Andrena Mews, MD, MPH
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
## 2017-07-30 NOTE — Progress Notes (Signed)
Patient ID: [name], female   DOB: 1956-05-08, 61 y.o.   MRN: [id number] Upon review I realized she was seen at the ED and referred for diagnostic mammogram.   Report received from ED provider below:  Delora Fuel, MD  Mariel Aloe, MD; Kinnie Feil, MD        Regarding health maintenance visit 02/25/2016, shouldn't a breast exam be part of the physical? (I do note that a mammogram was ordered, but no breast exam documented).   Delora Fuel MD       Thank you for the feedback. While this is relevant information, Clinical Breast Examination (CBE) is not recommended as an approach to screening for breast cancer. The USPSTF concludes that the current evidence is insufficient to assess the additional benefits and harms of clinical breast examination (CBE) beyond screening mammography in women 40 years or older.  Note: Although, I cosigned the resident's note, often they do not precept their patients with the preceptor and patient might have left before we see their notes. Regardless; I will have my staff contact patient for PCP follow-up for health maintenance. Thanks again for your feedback.

## 2017-07-30 NOTE — ED Triage Notes (Signed)
Pt states she has drainage from her left breast and it has a lump in it  Pt states she is unable to lay on her left side because of the pain  Pt states she noticed the drainage on Monday  Pt is blind

## 2017-07-31 LAB — PROLACTIN: PROLACTIN: 3.8 ng/mL — AB (ref 4.8–23.3)

## 2017-08-04 ENCOUNTER — Other Ambulatory Visit: Payer: Self-pay | Admitting: Internal Medicine

## 2017-08-04 ENCOUNTER — Encounter: Payer: Self-pay | Admitting: Internal Medicine

## 2017-08-04 ENCOUNTER — Ambulatory Visit: Payer: Medicaid Other | Admitting: Student

## 2017-08-04 ENCOUNTER — Ambulatory Visit (INDEPENDENT_AMBULATORY_CARE_PROVIDER_SITE_OTHER): Payer: Medicaid Other | Admitting: Internal Medicine

## 2017-08-04 VITALS — BP 180/80 | HR 105 | Temp 98.1°F | Wt 116.0 lb

## 2017-08-04 DIAGNOSIS — N6320 Unspecified lump in the left breast, unspecified quadrant: Secondary | ICD-10-CM

## 2017-08-04 DIAGNOSIS — N6321 Unspecified lump in the left breast, upper outer quadrant: Secondary | ICD-10-CM

## 2017-08-04 DIAGNOSIS — I10 Essential (primary) hypertension: Secondary | ICD-10-CM

## 2017-08-04 DIAGNOSIS — D0512 Intraductal carcinoma in situ of left breast: Secondary | ICD-10-CM | POA: Insufficient documentation

## 2017-08-04 NOTE — Patient Instructions (Signed)
It was so nice to meet you!  We need to evaluate this breast mass further. I have scheduled a mammogram on Friday and then we will have you follow-up with the breast doctor next week.  -Dr. Brett Albino

## 2017-08-04 NOTE — Assessment & Plan Note (Addendum)
Highly concerning for malignancy given history of slow growth of the mass over a two-year period, report of nipple discharge from the left breast, 50-60 lb weight loss, and the hard, firm nature of the mass on exam. Patient has never had a mammogram. Per chart review, she has been scheduled for a mammogram on multiple occasions, but did not go to the appointments. -diagnostic mammogram has been scheduled -referral placed to breast surgeon (spoke with oncology office, who prefer patient be seen by the breast surgeon prior to referral to oncology.

## 2017-08-04 NOTE — Progress Notes (Signed)
Date of Visit: 08/04/2017   HPI:  [name] is a 61 y.o. 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 She reports that she first noticed a mass in her breast 2 years ago when her grandson bumped into the area. Three to four months after this incident, she noticed that a knot in her left chest was growing and the mass has been increasing in size since then. She also noted left breast "soreness" at the time. In the last two weeks she has noticed discharge from the left breast. Due to her difficulties with vision, she is only able to describe the discharge as a dark substance in the left cup of her bra. However, she states that her sister said it "looks like dried blood." She has had pain in her left chest that radiates to the axilla for the last two weeks. The pain is constant and wakes her from sleep. She has never had a mammogram due to fear of the procedure. In addition, she endorses a 50-60 lb weight loss over the last year.     ROS: See HPI.  [place]: No family history of breast cancer   PHYSICAL EXAM: BP (!) 180/80   Pulse (!) 105   Temp 98.1 F (36.7 C) (Oral)   Wt 116 lb (52.6 kg)   SpO2 92%   BMI 19.91 kg/m  Gen: Cachetic-appearing woman in no acute distress  HEENT: No anterior or posterior cervical lymphadenopathy, mucous membranes moist  Heart: RRR, no murmurs Lungs: Clear to ascultation bilaterally, no increased work of breathing  Breast: No masses, skin irregularities or discharge of right breast; 5 cm x 3 cm irregularly-shaped, firm mass extending from the 12 o'clock position to the 3 o'clock position, pain elicited on palpation of left breast, no skin changes or discharge present, no axillary lymphadenopathy appreciated bilaterally   ASSESSMENT/PLAN:   Left breast mass Highly  concerning for malignancy given history of slow growth of the mass over a two-year period, report of nipple discharge from the left breast, 50-60 lb weight loss, and the hard, firm nature of the mass on exam. Patient has never had a mammogram. Per chart review, she has been scheduled for a mammogram on multiple occasions, but did not go to the appointments. -diagnostic mammogram has been scheduled -referral placed to breast surgeon (spoke with oncology office, who prefer patient be seen by the breast surgeon prior to referral to oncology.  HYPERTENSION, BENIGN SYSTEMIC Uncontrolled. BP 180/80. Due to difficult discussion about left breast mass, did not discuss blood pressure today. -Follow-up with PCP   I personally evaluated this patient along with the student, and verified all aspects of the history, physical exam, and medical decision making as documented by the student. I agree with the student's documentation and have made all necessary edits.  Hyman Bible, MD PGY-3

## 2017-08-05 ENCOUNTER — Other Ambulatory Visit: Payer: Self-pay | Admitting: Internal Medicine

## 2017-08-05 DIAGNOSIS — N6320 Unspecified lump in the left breast, unspecified quadrant: Secondary | ICD-10-CM

## 2017-08-05 NOTE — Assessment & Plan Note (Signed)
Uncontrolled. BP 180/80. Due to difficult discussion about left breast mass, did not discuss blood pressure today. -Follow-up with PCP

## 2017-08-07 ENCOUNTER — Other Ambulatory Visit: Payer: Self-pay | Admitting: Internal Medicine

## 2017-08-07 ENCOUNTER — Ambulatory Visit
Admission: RE | Admit: 2017-08-07 | Discharge: 2017-08-07 | Disposition: A | Discharge status: Home / Self Care | Payer: Medicaid Other | Source: Ambulatory Visit | Attending: Family Medicine | Admitting: Family Medicine

## 2017-08-07 DIAGNOSIS — N6310 Unspecified lump in the right breast, unspecified quadrant: Secondary | ICD-10-CM

## 2017-08-07 DIAGNOSIS — R599 Enlarged lymph nodes, unspecified: Secondary | ICD-10-CM

## 2017-08-07 DIAGNOSIS — N6320 Unspecified lump in the left breast, unspecified quadrant: Secondary | ICD-10-CM

## 2017-08-10 ENCOUNTER — Other Ambulatory Visit: Payer: Self-pay | Admitting: Surgery

## 2017-08-13 ENCOUNTER — Other Ambulatory Visit: Payer: Medicaid Other

## 2017-08-13 ENCOUNTER — Other Ambulatory Visit: Payer: Self-pay | Admitting: Internal Medicine

## 2017-08-13 ENCOUNTER — Ambulatory Visit
Admission: RE | Admit: 2017-08-13 | Discharge: 2017-08-13 | Disposition: A | Discharge status: Home / Self Care | Payer: Medicaid Other | Source: Ambulatory Visit | Attending: Family Medicine | Admitting: Family Medicine

## 2017-08-13 DIAGNOSIS — N6320 Unspecified lump in the left breast, unspecified quadrant: Secondary | ICD-10-CM

## 2017-08-13 DIAGNOSIS — R599 Enlarged lymph nodes, unspecified: Secondary | ICD-10-CM

## 2017-08-14 ENCOUNTER — Encounter: Payer: Self-pay | Admitting: Hematology and Oncology

## 2017-08-14 ENCOUNTER — Encounter: Payer: Self-pay | Admitting: Radiation Oncology

## 2017-08-14 NOTE — Progress Notes (Signed)
Location of Breast Cancer: left breast mass  Histology per Pathology Report:  08/13/17 Diagnosis 1. Breast, left, needle core biopsy, LOQ - HIGH GRADE DUCTAL CARCINOMA IN SITU WITH NECROSIS. - SEE MICROSCOPIC DESCRIPTION. 2. Breast, left, needle core biopsy, UIQ - HIGH GRADE DUCTAL CARCINOMA WITH NECROSIS. - SEE MICROSCOPIC DESCRIPTION. 3. Lymph node, needle/core biopsy, left axilla - BENIGN LYMPH NODE TISSUE. - NO MALIGNANCY IDENTIFIED.  Receptor Status: ER(40%), PR (20%), Her2-neu (), Ki-()  Did patient present with symptoms (if so, please note symptoms) or was this found on screening mammography?: patient had felt a lump for 2-3 years.  Past/Anticipated interventions by surgeon, if any: biopsy done 08/13/17.    Past/Anticipated interventions by medical oncology, if any: Apt with Dr. Lindi Adie on 08/24/17  Lymphedema issues, if any:  no  Pain issues, if any:  yes has shooting pains in her left breast.  SAFETY ISSUES:  Prior radiation? no  Pacemaker/ICD? no  Possible current pregnancy?no  Is the patient on methotrexate? no  Current Complaints / other details:  Patient is here with her sister and niece.    BP (!) 185/98 (BP Location: Left Arm, Patient Position: Sitting)   Pulse (!) 55   Temp 98.3 F (36.8 C) (Oral)   Ht '5\' 4"'  (1.626 m)   Wt 120 lb 9.6 oz (54.7 kg)   SpO2 100%   BMI 20.70 kg/m    Wt Readings from Last 3 Encounters:  08/04/17 116 lb (52.6 kg)  03/14/17 138 lb (62.6 kg)  06/19/16 146 lb (66.2 kg)      Jacqulyn Liner, RN 08/14/2017,7:39 AM

## 2017-08-19 ENCOUNTER — Encounter: Payer: Self-pay | Admitting: Adult Health

## 2017-08-19 DIAGNOSIS — C50812 Malignant neoplasm of overlapping sites of left female breast: Secondary | ICD-10-CM | POA: Insufficient documentation

## 2017-08-19 DIAGNOSIS — Z170 Estrogen receptor positive status [ER+]: Secondary | ICD-10-CM

## 2017-08-24 ENCOUNTER — Ambulatory Visit
Admission: RE | Admit: 2017-08-24 | Discharge: 2017-08-24 | Disposition: A | Discharge status: Home / Self Care | Payer: Medicaid Other | Source: Ambulatory Visit | Attending: Radiation Oncology | Admitting: Radiation Oncology

## 2017-08-24 ENCOUNTER — Ambulatory Visit (HOSPITAL_BASED_OUTPATIENT_CLINIC_OR_DEPARTMENT_OTHER): Payer: Medicaid Other | Admitting: Hematology and Oncology

## 2017-08-24 ENCOUNTER — Encounter: Payer: Self-pay | Admitting: *Deleted

## 2017-08-24 ENCOUNTER — Encounter: Payer: Self-pay | Admitting: Radiation Oncology

## 2017-08-24 DIAGNOSIS — Z79899 Other long term (current) drug therapy: Secondary | ICD-10-CM | POA: Insufficient documentation

## 2017-08-24 DIAGNOSIS — Z7902 Long term (current) use of antithrombotics/antiplatelets: Secondary | ICD-10-CM | POA: Diagnosis not present

## 2017-08-24 DIAGNOSIS — Z720 Tobacco use: Secondary | ICD-10-CM | POA: Diagnosis not present

## 2017-08-24 DIAGNOSIS — Z170 Estrogen receptor positive status [ER+]: Principal | ICD-10-CM

## 2017-08-24 DIAGNOSIS — I2510 Atherosclerotic heart disease of native coronary artery without angina pectoris: Secondary | ICD-10-CM | POA: Diagnosis not present

## 2017-08-24 DIAGNOSIS — Z800 Family history of malignant neoplasm of digestive organs: Secondary | ICD-10-CM | POA: Diagnosis not present

## 2017-08-24 DIAGNOSIS — C50812 Malignant neoplasm of overlapping sites of left female breast: Secondary | ICD-10-CM

## 2017-08-24 DIAGNOSIS — Z8673 Personal history of transient ischemic attack (TIA), and cerebral infarction without residual deficits: Secondary | ICD-10-CM

## 2017-08-24 DIAGNOSIS — Z7982 Long term (current) use of aspirin: Secondary | ICD-10-CM | POA: Diagnosis not present

## 2017-08-24 DIAGNOSIS — D0512 Intraductal carcinoma in situ of left breast: Secondary | ICD-10-CM | POA: Diagnosis present

## 2017-08-24 HISTORY — DX: Malignant neoplasm of unspecified site of unspecified female breast: C50.919

## 2017-08-24 NOTE — Progress Notes (Signed)
Radiation Oncology         [phone] ________________________________  Initial Outpatient Consultation  Name: [name] MRN: [id number]  Date: 08/24/2017  DOB: Feb 10, 1956  LK:[id number], Curt Bears, MD  [name], MD   REFERRING PHYSICIAN: [name], MD  DIAGNOSIS:  high grade ductal carcinoma in situ of the left breast  HISTORY OF PRESENT ILLNESS::[name] is a 61 y.o. female who presented for diagnostic mammogram and bilateral breast ultrasound on 08/07/17 for evaluation of a palpable left breast mass x 3 years and breast pain. These scans showed a large mass in the left breast involving 3 quadrants associated with extensive calcifications. The calcifications span at least 7.5 cm mammographically. There are at least 2 and possibly 3 suspicious prominent left axillary lymph nodes. There is a benign appearing cyst in the upper-outer retroareolar right breast. Ultrasound-guided biopsies were performed on 08/13/17. Left breast lower outer quadrant showed high grade ductal carcinoma in situ with necrosis, ER 40%, PR 20%. Left breast upper inner quadrant showed high grade ductal carcinoma with necrosis. There is a microscopic focus in the upper inner quadrant biopsy where microscopic invasion can not be ruled out. Left axillary lymph node showed benign lymph node tissue with no malignancy identified.   The patient is here for further evaluation and discussion of radiation treatment options in the management of her disease.  PREVIOUS RADIATION THERAPY: No  PAST MEDICAL HISTORY:  has a past medical history of Adjustment disorder with depressed mood (01/11/2009); Arthritis; Asthma; CAD (07/10/2008); Chronic back pain; COCAINE DEPENDENCY NOS (01/14/2007); CONVULSIONS, SEIZURES, NOS (01/14/2007); COPD (chronic obstructive pulmonary disease) ([place]); Coronary artery disease (01/14/2012); CVA (01/14/2007); Family history of anesthesia complication (6644); Glaucoma of both eyes;  Headache(784.0); HEPATITIS C (02/26/2009); Hepatitis C; Hyperlipemia; HYPERTENSION, BENIGN SYSTEMIC (01/14/2007); Memory loss; MI (myocardial infarction) ([place]) (12/20/2007); Pneumonia; PTSD (post-traumatic stress disorder); PTSD (post-traumatic stress disorder); Right sided weakness; Sciatica; Stroke ([place]); and TOBACCO DEPENDENCE (01/14/2007).    PAST SURGICAL HISTORY: Past Surgical History:  Procedure Laterality Date  . CATARACT EXTRACTION W/PHACO Right 07/11/2015   Procedure: CATARACT EXTRACTION PHACO AND INTRAOCULAR LENS PLACEMENT ([place]) RIGHT,INTERNAL PLEB REVISION;  Surgeon: Marylynn Pearson, MD;  Location: [place];  Service: Ophthalmology;  Laterality: Right;  . CESAREAN SECTION     x 2  . COLONOSCOPY W/ POLYPECTOMY    . CORONARY ANGIOPLASTY WITH STENT PLACEMENT  2009   1 stent  . EYE SURGERY    . MITOMYCIN C APPLICATION Right 03/47/4259   Procedure: MITOMYCIN C APPLICATION RIGHT EYE;  Surgeon: Marylynn Pearson, MD;  Location: [facility];  Service: Ophthalmology;  Laterality: Right;  . TONSILLECTOMY    . TRABECULECTOMY Left 06/29/2013   Procedure: TRABECULECTOMY WITH MYTOMICIN C LEFT EYE;  Surgeon: Marylynn Pearson, MD;  Location: [place];  Service: Ophthalmology;  Laterality: Left;  . TRABECULECTOMY Right 09/07/2013   Procedure: TRABECULECTOMY RIGHT EYE;  Surgeon: Marylynn Pearson, MD;  Location: [place];  Service: Ophthalmology;  Laterality: Right;    FAMILY HISTORY: family history includes Colon cancer in her paternal aunt and paternal uncle; Heart disease in her sister; Stroke in her mother.  SOCIAL HISTORY:  reports that she has been smoking Cigarettes.  She has a 11.75 pack-year smoking history. She has never used smokeless tobacco. She reports that she does not drink alcohol or use drugs.  ALLERGIES: Bee venom  MEDICATIONS:  Current Outpatient Prescriptions  Medication Sig Dispense Refill  . amLODipine (NORVASC) 5 MG tablet Take 10 mg by mouth every morning.     [name]  aspirin 81 MG EC tablet Take 81 mg by  mouth daily.      [name] atorvastatin (LIPITOR) 40 MG tablet Take 40 mg by mouth every morning.     . carvedilol (COREG) 12.5 MG tablet Take 12.5 mg by mouth 2 (two) times daily with a meal.    . clopidogrel (PLAVIX) 75 MG tablet Take 75 mg by mouth every morning.     . nitroGLYCERIN (NITROSTAT) 0.4 MG SL tablet Place 0.4 mg under the tongue every 5 (five) minutes as needed. x3 doses as needed for chest pain    . potassium chloride SA (K-DUR,KLOR-CON) 20 MEQ tablet Take 1 tablet (20 mEq total) by mouth 2 (two) times daily. 60 tablet 0  . albuterol (PROVENTIL HFA;VENTOLIN HFA) 108 (90 BASE) MCG/ACT inhaler Inhale 2 puffs into the lungs every 6 (six) hours as needed for wheezing. For wheezing     No current facility-administered medications for this encounter.    Facility-Administered Medications Ordered in Other Encounters  Medication Dose Route Frequency Provider Last Rate Last Dose  . mitoMYcin (MUTAMYCIN) Injection Use in OR only (0.4 mg/ml)  0.5 mL Right Eye Once Marylynn Pearson, MD        REVIEW OF SYSTEMS:  On review of systems, the patient reports that she is doing well overall. She is accompanied by her sister and niece today. She denies any chest pain, shortness of breath, cough, fevers, chills, night sweats, unintended weight changes. She denies any bowel or bladder disturbances, and denies abdominal pain, nausea or vomiting. She reports shooting pains in her left breast. She denies swelling or pain down her arm. She previously noticed breast drainage, not from the nipple. States her grandson bumped his head into her left breast two years ago, she had assumed the area in her breast was just a blood collection until it continued to increase in size and developed pain. States she has had cataract and glaucoma surgeries so her vision is limited.REVIEW OF SYSTEMS: A 10+ POINT REVIEW OF SYSTEMS WAS OBTAINED including neurology, dermatology, psychiatry, cardiac, respiratory, lymph, extremities, GI, GU,  musculoskeletal, constitutional, reproductive, HEENT. All pertinent positives are noted in the HPI. All others are negative.   PHYSICAL EXAM:  height is '5\' 4"'  (1.626 m) and weight is 120 lb 9.6 oz (54.7 kg). Her oral temperature is 98.3 F (36.8 C). Her blood pressure is 185/98 (abnormal) and her pulse is 55 (abnormal). Her oxygen saturation is 100%.   General: Alert and oriented, in no acute distress HEENT: Head is normocephalic. Disconjugate gaze. Extraocular movements are intact. Oropharynx is clear. Neck: Neck is supple, no palpable cervical or supraclavicular lymphadenopathy. Heart: Regular in rate and rhythm with no murmurs, rubs, or gallops. Chest: Clear to auscultation bilaterally, with no rhonchi, wheezes, or rales. Abdomen: Soft, nontender, nondistended, with no rigidity or guarding. Extremities: No cyanosis or edema. Lymphatics: see Neck Exam Skin: No concerning lesions. Musculoskeletal: symmetric strength and muscle tone throughout. Neurologic: Cranial nerves II through XII are grossly intact. No obvious focalities. Speech is fluent. Coordination is intact. Weakness noted along the right side from her prior CVA. Weakness more significant in the right upper extremity. Patient is able to ambulate with assistance Psychiatric: Judgment and insight are intact. Affect is appropriate. Breast: Right breast has no palpable mass or nipple discharge. Left breast shows steri strips present in the upper inner quadrant and lower outer quadrant. Patient has a large palpable mass in the central aspect of the left breast measuring approximately 5 x  4.5 cm. There is possibly a separate smaller mass in the the lower outer quadrant measuring approximately 1.5 cm. Patient has a palpable 1.5 cm lymph node in the left axilla which is obviously visible when lying flat and arm abducted.    ECOG = 2  0 - Asymptomatic (Fully active, able to carry on all predisease activities without restriction)  1 -  Symptomatic but completely ambulatory (Restricted in physically strenuous activity but ambulatory and able to carry out work of a light or sedentary nature. For example, light housework, office work)  2 - Symptomatic, <50% in bed during the day (Ambulatory and capable of all self care but unable to carry out any work activities. Up and about more than 50% of waking hours)  3 - Symptomatic, >50% in bed, but not bedbound (Capable of only limited self-care, confined to bed or chair 50% or more of waking hours)  4 - Bedbound (Completely disabled. Cannot carry on any self-care. Totally confined to bed or chair)  5 - Death   Eustace Pen MM, Creech RH, Tormey DC, et al. [id number]). "Toxicity and response criteria of the [facility]". [place] Oncol. 5 (6): 649-55  LABORATORY DATA:  Lab Results  Component Value Date   WBC 9.1 07/30/2017   HGB 12.6 07/30/2017   HCT 35.0 (L) 07/30/2017   MCV 83.3 07/30/2017   PLT 241 07/30/2017   NEUTROABS 4.0 07/30/2017   Lab Results  Component Value Date   NA 140 07/30/2017   K 2.7 (LL) 07/30/2017   CL 101 07/30/2017   CO2 29 07/30/2017   GLUCOSE 111 (H) 07/30/2017   CREATININE 0.70 07/30/2017   CALCIUM 9.8 07/30/2017      RADIOGRAPHY: US Breast Ltd Uni Left Inc Axilla  Result Date: 08/07/2017 CLINICAL DATA:  61 year old female presenting for evaluation of a palpable left breast mass for 3 years. EXAM: 2D DIGITAL DIAGNOSTIC BILATERAL MAMMOGRAM WITH CAD AND ADJUNCT TOMO BILATERAL BREAST ULTRASOUND COMPARISON:  None. ACR Breast Density Category d: The breast tissue is extremely dense, which lowers the sensitivity of mammography. FINDINGS: A BB has been placed on the upper-outer quadrant of the left breast indicating the palpable site of concern. Deep to the palpable marker and involving primarily the upper-outer quadrant, though extending into the upper inner and lower outer quadrants as well, there is a broad area of coarse heterogeneous,  pleomorphic and fine linear branching calcifications spanning at least 7.5 x 7.3 x 7.0 cm. Overall, the left breast appears smaller than the right breast. In the upper-outer retroareolar right breast there is a possible obscured mass. Mammographic images were processed with CAD. No palpable masses are identified in the retroareolar right breast. Physical exam of the left breast demonstrates a broad dense mass in the retroareolar left breast extending into the upper outer and lateral aspect of the left breast. Ultrasound of the upper-outer quadrant of the right breast at 10 o'clock, 1 cm from the nipple demonstrates a small benign-appearing anechoic cyst measuring 5 mm. Ultrasound of the upper inner, upper outer and lower outer quadrants of the left breast demonstrates diffusely abnormal tissue with echogenic foci compatible with calcifications identified mammographically. As the mass is large and ill-defined, accurate measurement is difficult, but does involve at least 3 quadrants. Ultrasound of the left axilla demonstrates 2 and possibly 3 questionably enlarged lymph nodes. IMPRESSION: 1. There is a large mass in the left breast involving 3 quadrants associated with extensive calcifications. The calcifications span at least 7.5  cm mammographically. 2. There are at least 2 and possibly 3 suspicious prominent left axillary lymph nodes. 3. There is a benign-appearing cyst in the upper-outer retroareolar right breast. RECOMMENDATION: Ultrasound-guided biopsy is recommended for 2 distant sites in the left breast, and for 1 of the left axillary lymph nodes. The patient has been scheduled for 08/13/2017 at 12:45 p.m. I have discussed the findings and recommendations with the patient. Results were also provided in writing at the conclusion of the visit. If applicable, a reminder letter will be sent to the patient regarding the next appointment. BI-RADS CATEGORY  5: Highly suggestive of malignancy. Electronically Signed    By: Ammie Ferrier M.D.   On: 08/07/2017 16:22   US Breast Ltd Uni Right Inc Axilla  Result Date: 08/07/2017 CLINICAL DATA:  61 year old female presenting for evaluation of a palpable left breast mass for 3 years. EXAM: 2D DIGITAL DIAGNOSTIC BILATERAL MAMMOGRAM WITH CAD AND ADJUNCT TOMO BILATERAL BREAST ULTRASOUND COMPARISON:  None. ACR Breast Density Category d: The breast tissue is extremely dense, which lowers the sensitivity of mammography. FINDINGS: A BB has been placed on the upper-outer quadrant of the left breast indicating the palpable site of concern. Deep to the palpable marker and involving primarily the upper-outer quadrant, though extending into the upper inner and lower outer quadrants as well, there is a broad area of coarse heterogeneous, pleomorphic and fine linear branching calcifications spanning at least 7.5 x 7.3 x 7.0 cm. Overall, the left breast appears smaller than the right breast. In the upper-outer retroareolar right breast there is a possible obscured mass. Mammographic images were processed with CAD. No palpable masses are identified in the retroareolar right breast. Physical exam of the left breast demonstrates a broad dense mass in the retroareolar left breast extending into the upper outer and lateral aspect of the left breast. Ultrasound of the upper-outer quadrant of the right breast at 10 o'clock, 1 cm from the nipple demonstrates a small benign-appearing anechoic cyst measuring 5 mm. Ultrasound of the upper inner, upper outer and lower outer quadrants of the left breast demonstrates diffusely abnormal tissue with echogenic foci compatible with calcifications identified mammographically. As the mass is large and ill-defined, accurate measurement is difficult, but does involve at least 3 quadrants. Ultrasound of the left axilla demonstrates 2 and possibly 3 questionably enlarged lymph nodes. IMPRESSION: 1. There is a large mass in the left breast involving 3 quadrants  associated with extensive calcifications. The calcifications span at least 7.5 cm mammographically. 2. There are at least 2 and possibly 3 suspicious prominent left axillary lymph nodes. 3. There is a benign-appearing cyst in the upper-outer retroareolar right breast. RECOMMENDATION: Ultrasound-guided biopsy is recommended for 2 distant sites in the left breast, and for 1 of the left axillary lymph nodes. The patient has been scheduled for 08/13/2017 at 12:45 p.m. I have discussed the findings and recommendations with the patient. Results were also provided in writing at the conclusion of the visit. If applicable, a reminder letter will be sent to the patient regarding the next appointment. BI-RADS CATEGORY  5: Highly suggestive of malignancy. Electronically Signed   By: Ammie Ferrier M.D.   On: 08/07/2017 16:22   Mm Diag Breast Tomo Bilateral  Result Date: 08/07/2017 CLINICAL DATA:  61 year old female presenting for evaluation of a palpable left breast mass for 3 years. EXAM: 2D DIGITAL DIAGNOSTIC BILATERAL MAMMOGRAM WITH CAD AND ADJUNCT TOMO BILATERAL BREAST ULTRASOUND COMPARISON:  None. ACR Breast Density Category d: The breast tissue  is extremely dense, which lowers the sensitivity of mammography. FINDINGS: A BB has been placed on the upper-outer quadrant of the left breast indicating the palpable site of concern. Deep to the palpable marker and involving primarily the upper-outer quadrant, though extending into the upper inner and lower outer quadrants as well, there is a broad area of coarse heterogeneous, pleomorphic and fine linear branching calcifications spanning at least 7.5 x 7.3 x 7.0 cm. Overall, the left breast appears smaller than the right breast. In the upper-outer retroareolar right breast there is a possible obscured mass. Mammographic images were processed with CAD. No palpable masses are identified in the retroareolar right breast. Physical exam of the left breast demonstrates a broad  dense mass in the retroareolar left breast extending into the upper outer and lateral aspect of the left breast. Ultrasound of the upper-outer quadrant of the right breast at 10 o'clock, 1 cm from the nipple demonstrates a small benign-appearing anechoic cyst measuring 5 mm. Ultrasound of the upper inner, upper outer and lower outer quadrants of the left breast demonstrates diffusely abnormal tissue with echogenic foci compatible with calcifications identified mammographically. As the mass is large and ill-defined, accurate measurement is difficult, but does involve at least 3 quadrants. Ultrasound of the left axilla demonstrates 2 and possibly 3 questionably enlarged lymph nodes. IMPRESSION: 1. There is a large mass in the left breast involving 3 quadrants associated with extensive calcifications. The calcifications span at least 7.5 cm mammographically. 2. There are at least 2 and possibly 3 suspicious prominent left axillary lymph nodes. 3. There is a benign-appearing cyst in the upper-outer retroareolar right breast. RECOMMENDATION: Ultrasound-guided biopsy is recommended for 2 distant sites in the left breast, and for 1 of the left axillary lymph nodes. The patient has been scheduled for 08/13/2017 at 12:45 p.m. I have discussed the findings and recommendations with the patient. Results were also provided in writing at the conclusion of the visit. If applicable, a reminder letter will be sent to the patient regarding the next appointment. BI-RADS CATEGORY  5: Highly suggestive of malignancy. Electronically Signed   By: Ammie Ferrier M.D.   On: 08/07/2017 16:22   [place] Axillary Node Core Biopsy Left  Addendum Date: 08/14/2017   ADDENDUM REPORT: 08/14/2017 15:19 ADDENDUM: Pathology revealed HIGH GRADE DUCTAL CARCINOMA IN SITU WITH NECROSIS of the Left breast, both locations, lower outer quadrant and upper inner quadrant. There is a microscopic focus in the upper inner quadrant biopsy where microscopic  invasion can not be ruled out. BENIGN LYMPH NODE TISSUE of the Left axilla. This was found to be concordant by Dr. Kristopher Oppenheim. However, based on the imaging findings, there is concern for additional areas of invasive disease. Pathology results were discussed with the patient's sister, Reyes Ivan by telephone, per patient request. Ms. Koleen Nimrod reported her sister did well after the biopsies with tenderness at the sites. Post biopsy instructions and care were reviewed and questions were answered. The patient's sister was encouraged to call [facility] for any additional concerns. Surgical consultation has been arranged with Dr. Nedra Hai at [facility] on August 19, 2017. Additional biopsy or MRI may be obtained as clinically necessary. Pathology results reported by Terie Purser, RN on 08/14/2017. Electronically Signed   By: Kristopher Oppenheim M.D.   On: 08/14/2017 15:19   Result Date: 08/14/2017 CLINICAL DATA:  61 year old female with suspicious left breast mass and indeterminate left axillary lymph nodes. EXAM: ULTRASOUND GUIDED LEFT BREAST  AND AXILLARY CORE NEEDLE BIOPSY COMPARISON:  Previous exam(s). FINDINGS: I met with the patient and we discussed the procedure of ultrasound-guided biopsy, including benefits and alternatives. We discussed the high likelihood of a successful procedure. We discussed the risks of the procedure, including infection, bleeding, tissue injury, clip migration, and inadequate sampling. Informed written consent was given. The usual time-out protocol was performed immediately prior to the procedure. Lesion quadrant: Lower outer and upper inner quadrants. Using sterile technique and 1% Lidocaine as local anesthetic, under direct ultrasound visualization, a 12 gauge spring-loaded device was used to perform biopsy of the lower outer quadrant mass using an inferior approach. At the conclusion of the procedure a ribbon tissue marker clip was  deployed into the biopsy cavity. Using sterile technique and 1% Lidocaine as local anesthetic, under direct ultrasound visualization, a 12 gauge spring-loaded device was used to perform biopsy of the upper inner quadrant mass using a medial approach. At the conclusion of the procedure a coil tissue marker clip was deployed into the biopsy cavity. Using sterile technique and 1% Lidocaine as local anesthetic, under direct ultrasound visualization, a 14 gauge spring-loaded device was used to perform biopsy of a left axillary lymph node. At the conclusion of the procedure a HydroMARK tissue marker clip was deployed into the biopsy cavity. Follow up 2 view mammogram was performed and dictated separately. IMPRESSION: Two area ultrasound-guided biopsy of a left breast mass and ultrasound-guided biopsy of a left axillary lymph node. No apparent complications. Electronically Signed: By: Kristopher Oppenheim M.D. On: 08/13/2017 15:15   Mm Clip Placement Left  Result Date: 08/13/2017 CLINICAL DATA:  61 year old female status post ultrasound-guided biopsy of left breast mass and left axillary lymph node. EXAM: DIAGNOSTIC LEFT MAMMOGRAM POST ULTRASOUND BIOPSY COMPARISON:  Previous exam(s). FINDINGS: Mammographic images were obtained following ultrasound guided biopsy of of left breast masses in the left axillary lymph node. A ribbon shaped and coil shaped clip are identified in the central inferior aspect and superior aspect in the expected location status post biopsy. A HydroMARK clip is identified in the left axilla. IMPRESSION: Two left breast post biopsy clips and left axillary post biopsy clip in the expected locations status post ultrasound-guided biopsy. Final Assessment: Post Procedure Mammograms for Marker Placement Electronically Signed   By: Kristopher Oppenheim M.D.   On: 08/13/2017 15:14   [place] Lt Breast Bx W Loc Dev 1st Lesion Img Bx Spec US Guide  Addendum Date: 08/14/2017   ADDENDUM REPORT: 08/14/2017 15:19 ADDENDUM:  Pathology revealed HIGH GRADE DUCTAL CARCINOMA IN SITU WITH NECROSIS of the Left breast, both locations, lower outer quadrant and upper inner quadrant. There is a microscopic focus in the upper inner quadrant biopsy where microscopic invasion can not be ruled out. BENIGN LYMPH NODE TISSUE of the Left axilla. This was found to be concordant by Dr. Kristopher Oppenheim. However, based on the imaging findings, there is concern for additional areas of invasive disease. Pathology results were discussed with the patient's sister, Reyes Ivan by telephone, per patient request. Ms. Koleen Nimrod reported her sister did well after the biopsies with tenderness at the sites. Post biopsy instructions and care were reviewed and questions were answered. The patient's sister was encouraged to call [facility] for any additional concerns. Surgical consultation has been arranged with Dr. Nedra Hai at [facility] on August 19, 2017. Additional biopsy or MRI may be obtained as clinically necessary. Pathology results reported by Terie Purser, RN on 08/14/2017. Electronically Signed  By: Kristopher Oppenheim M.D.   On: 08/14/2017 15:19   Result Date: 08/14/2017 CLINICAL DATA:  61 year old female with suspicious left breast mass and indeterminate left axillary lymph nodes. EXAM: ULTRASOUND GUIDED LEFT BREAST AND AXILLARY CORE NEEDLE BIOPSY COMPARISON:  Previous exam(s). FINDINGS: I met with the patient and we discussed the procedure of ultrasound-guided biopsy, including benefits and alternatives. We discussed the high likelihood of a successful procedure. We discussed the risks of the procedure, including infection, bleeding, tissue injury, clip migration, and inadequate sampling. Informed written consent was given. The usual time-out protocol was performed immediately prior to the procedure. Lesion quadrant: Lower outer and upper inner quadrants. Using sterile technique and 1% Lidocaine as local  anesthetic, under direct ultrasound visualization, a 12 gauge spring-loaded device was used to perform biopsy of the lower outer quadrant mass using an inferior approach. At the conclusion of the procedure a ribbon tissue marker clip was deployed into the biopsy cavity. Using sterile technique and 1% Lidocaine as local anesthetic, under direct ultrasound visualization, a 12 gauge spring-loaded device was used to perform biopsy of the upper inner quadrant mass using a medial approach. At the conclusion of the procedure a coil tissue marker clip was deployed into the biopsy cavity. Using sterile technique and 1% Lidocaine as local anesthetic, under direct ultrasound visualization, a 14 gauge spring-loaded device was used to perform biopsy of a left axillary lymph node. At the conclusion of the procedure a HydroMARK tissue marker clip was deployed into the biopsy cavity. Follow up 2 view mammogram was performed and dictated separately. IMPRESSION: Two area ultrasound-guided biopsy of a left breast mass and ultrasound-guided biopsy of a left axillary lymph node. No apparent complications. Electronically Signed: By: Kristopher Oppenheim M.D. 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 There is a microscopic focus in the upper inner quadrant biopsy where microscopic invasion can not be ruled out. BENIGN LYMPH NODE TISSUE of the Left axilla. This was found to be concordant by Dr. Kristopher Oppenheim. However, based on the imaging findings, there is concern for additional areas of invasive disease. Pathology results were discussed with the patient's sister, Reyes Ivan by telephone, per patient request. Ms. Koleen Nimrod reported her sister did well after the biopsies  with tenderness at the sites. Post biopsy instructions and care were reviewed and questions were answered. The patient's sister was encouraged to call [facility] for any additional concerns. Surgical consultation has been arranged with Dr. Nedra Hai at [facility] on August 19, 2017. Additional biopsy or MRI may be obtained as clinically necessary. Pathology results reported by Terie Purser, RN on 08/14/2017. Electronically Signed   By: Kristopher Oppenheim M.D.   On: 08/14/2017 15:19   Result Date: 08/14/2017 CLINICAL DATA:  61 year old female with suspicious left breast mass and indeterminate left axillary lymph nodes. EXAM: ULTRASOUND GUIDED LEFT BREAST AND AXILLARY CORE NEEDLE BIOPSY COMPARISON:  Previous exam(s). FINDINGS: I met with the patient and we discussed the procedure of ultrasound-guided biopsy, including benefits and alternatives. We discussed the high likelihood of a successful procedure. We discussed the risks of the procedure, including infection, bleeding, tissue injury, clip migration, and inadequate sampling. Informed written consent was given. The usual  time-out protocol was performed immediately prior to the procedure. Lesion quadrant: Lower outer and upper inner quadrants. Using sterile technique and 1% Lidocaine as local anesthetic, under direct ultrasound visualization, a 12 gauge spring-loaded device was used to perform biopsy of the lower outer quadrant mass using an inferior approach. At the conclusion of the procedure a ribbon tissue marker clip was deployed into the biopsy cavity. Using sterile technique and 1% Lidocaine as local anesthetic, under direct ultrasound visualization, a 12 gauge spring-loaded device was used to perform biopsy of the upper inner quadrant mass using a medial approach. At the conclusion of the procedure a coil tissue marker clip was deployed into the biopsy cavity. Using sterile technique and 1% Lidocaine as local  anesthetic, under direct ultrasound visualization, a 14 gauge spring-loaded device was used to perform biopsy of a left axillary lymph node. At the conclusion of the procedure a HydroMARK tissue marker clip was deployed into the biopsy cavity. Follow up 2 view mammogram was performed and dictated separately. IMPRESSION: Two area ultrasound-guided biopsy of a left breast mass and ultrasound-guided biopsy of a left axillary lymph node. No apparent complications. Electronically Signed: By: Kristopher Oppenheim M.D. On: 08/13/2017 15:15      IMPRESSION: Patient has high grade ductal carcinoma in situ in both the lower inner quadrant and lower outer quadrant of the left breast. Even though the lymph node biopsy was negative, there is still concern the patient may have metastatic spread as her mammogram and ultrasounds show at least two if not three prominent left axillary lymph nodes. Based on the size of the palpable mass as it relates to her breast size, and the multicentric nature, the patient would not be a candidate for breast conservation therapy. I discussed this with the patient today and she seems to understand.    The patient may require post-mastectomy radiation therapy if she is found to have invasive disease and a large tumor (greater than 5 cm) or if she is found to have lymph node metastasis on her surgical procedure.   PLAN: She will meet with Dr. Lindi Adie this afternoon at 1 pm. The patient will meet again with Dr. Ninfa Linden friday to schedule her surgery.       ------------------------------------------------  Blair Promise, PhD, MD  This document serves as a record of services personally performed by [name], MD. It was created on his behalf by Arlyce Harman, a trained medical scribe. The creation of this record is based on the scribe's personal observations and the provider's statements to them. This document has been checked and approved by the attending provider.

## 2017-08-24 NOTE — Assessment & Plan Note (Signed)
08/13/2017: Mammogram and ultrasound:Large left breast mass involving all quadrants measuring 7.5 cm with enlarged axillary lymph node, biopsy revealed high-grade  DCIS ER 40% PR 20%,, lymph node biopsy negative, Tis N0 stage 0  Pathology review: I discussed with the patient the difference between DCIS and invasive breast cancer. It is considered a precancerous lesion. DCIS is classified as a 0. It is generally detected through mammograms as calcifications. We discussed the significance of grades and its impact on prognosis. We also discussed the importance of ER and PR receptors and their implications to adjuvant treatment options. Prognosis of DCIS dependence on grade, comedo necrosis. It is anticipated that if not treated, 20-30% of DCIS can develop into invasive breast cancer.  Recommendation: 1.Left mastectomy 2. Followed by antiestrogen therapy with anastrozole5 years Patient history of  Stroke and coronary artery disease and therefore she did not good candidate for tamoxifen.  Return to clinic after surgery to discuss the final pathology report and come up with an adjuvant treatment plan.

## 2017-08-24 NOTE — Progress Notes (Signed)
Please see the Nurse Progress Note in the MD Initial Consult Encounter for this patient. 

## 2017-08-24 NOTE — Progress Notes (Signed)
[place] CONSULT NOTE  Patient Care Team: Sela Hilding, MD as PCP - General (Family Medicine)  CHIEF COMPLAINTS/PURPOSE OF CONSULTATION:  Newly diagnosed left breast DCIS  HISTORY OF PRESENTING ILLNESS:  [name] 61 y.o. female is here because of recent diagnosis of left breast DCIS. Patient has multiple health issues including stroke and cardiovascular disease whohad a discomfort in the breast after her grandson fell on her breasts. She went to see her primary care physician who sent her for mammogram and ultrasound. Biopsy of the mass revealed high-grade DCIS andthe lymph node came back as benign. She was sent to [place] for discussion regarding treatment options by Dr. Ninfa Linden.  I reviewed her records extensively and collaborated the history with the patient.  SUMMARY OF ONCOLOGIC HISTORY:   Ductal carcinoma in situ (DCIS) of left breast   08/13/2017 Initial Diagnosis    Large left breast mass involving all quadrants measuring 7.5 cm with enlarged axillary lymph node, biopsy revealed high-grade  DCIS ER 40% PR 20%,, lymph node biopsy negative, Tis N0 stage 0       Malignant neoplasm of overlapping sites of left breast in female, estrogen receptor positive ([place]) (Resolved)   08/19/2017 Initial Diagnosis    Malignant neoplasm of overlapping sites of left breast in female, estrogen receptor positive [facility])      MEDICAL HISTORY:  Past Medical History:  Diagnosis Date  . Adjustment disorder with depressed mood 01/11/2009  . Arthritis   . Asthma   . Breast cancer ([place])    left breast  . CAD 07/10/2008  . Chronic back pain   . COCAINE DEPENDENCY NOS 01/14/2007  . CONVULSIONS, SEIZURES, NOS 01/14/2007   last seizure May, 2016  . COPD (chronic obstructive pulmonary disease) ([place])   . Coronary artery disease 01/14/2012  . CVA 01/14/2007  . Family history of anesthesia complication 4196   Pts sister died after having anesthesia  . Glaucoma of both eyes   .  Headache(784.0)   . HEPATITIS C 02/26/2009  . Hepatitis C   . Hyperlipemia   . HYPERTENSION, BENIGN SYSTEMIC 01/14/2007  . Memory loss    d/t CVA  . MI (myocardial infarction) ([place]) 12/20/2007   had stent placed  . Pneumonia    hx of  . PTSD (post-traumatic stress disorder)   . PTSD (post-traumatic stress disorder)   . Right sided weakness    d/t stroke  . Sciatica   . Stroke [facility])    right side weakness  . TOBACCO DEPENDENCE 01/14/2007    SURGICAL HISTORY: Past Surgical History:  Procedure Laterality Date  . CATARACT EXTRACTION W/PHACO Right 07/11/2015   Procedure: CATARACT EXTRACTION PHACO AND INTRAOCULAR LENS PLACEMENT ([place]) RIGHT,INTERNAL PLEB REVISION;  Surgeon: Marylynn Pearson, MD;  Location: [facility];  Service: Ophthalmology;  Laterality: Right;  . CESAREAN SECTION     x 2  . COLONOSCOPY W/ POLYPECTOMY    . CORONARY ANGIOPLASTY WITH STENT PLACEMENT  2009   1 stent  . EYE SURGERY    . MITOMYCIN C APPLICATION Right 22/29/7989   Procedure: MITOMYCIN C APPLICATION RIGHT EYE;  Surgeon: Marylynn Pearson, MD;  Location: [facility];  Service: Ophthalmology;  Laterality: Right;  . TONSILLECTOMY    . TRABECULECTOMY Left 06/29/2013   Procedure: TRABECULECTOMY WITH MYTOMICIN C LEFT EYE;  Surgeon: Marylynn Pearson, MD;  Location: [facility];  Service: Ophthalmology;  Laterality: Left;  . TRABECULECTOMY Right 09/07/2013   Procedure: TRABECULECTOMY RIGHT EYE;  Surgeon: Marylynn Pearson, MD;  Location: [facility];  Service: Ophthalmology;  Laterality: Right;    SOCIAL HISTORY: Social History   Social History  . Marital status: Single    Spouse name: N/A  . Number of children: 2  . Years of education: N/A   Occupational History  . Not on file.   Social History Main Topics  . Smoking status: Current Every Day Smoker    Packs/day: 0.25    Years: 47.00    Types: Cigarettes  . Smokeless tobacco: Never Used  . Alcohol use No     Comment: quit 4 years ago  . Drug use: No     Comment: last use of cocaine 6  years ago  . Sexual activity: No   Other Topics Concern  . Not on file   Social History Narrative   On disability- due to CVA/ PTSD and MI-- weak on right side from stroke.       Has 2 children.  Living with sister here in [place].     FAMILY HISTORY: Family History  Problem Relation Age of Onset  . Stroke Mother   . Colon cancer Paternal Uncle   . Heart disease Sister   . Colon cancer Paternal Aunt     ALLERGIES:  is allergic to bee venom.  MEDICATIONS:  Current Outpatient Prescriptions  Medication Sig Dispense Refill  . albuterol (PROVENTIL HFA;VENTOLIN HFA) 108 (90 BASE) MCG/ACT inhaler Inhale 2 puffs into the lungs every 6 (six) hours as needed for wheezing. For wheezing    . amLODipine (NORVASC) 5 MG tablet Take 10 mg by mouth every morning.     [name] aspirin 81 MG EC tablet Take 81 mg by mouth daily.      [name] atorvastatin (LIPITOR) 40 MG tablet Take 40 mg by mouth every morning.     . carvedilol (COREG) 12.5 MG tablet Take 12.5 mg by mouth 2 (two) times daily with a meal.    . clopidogrel (PLAVIX) 75 MG tablet Take 75 mg by mouth every morning.     . nitroGLYCERIN (NITROSTAT) 0.4 MG SL tablet Place 0.4 mg under the tongue every 5 (five) minutes as needed. x3 doses as needed for chest pain    . potassium chloride SA (K-DUR,KLOR-CON) 20 MEQ tablet Take 1 tablet (20 mEq total) by mouth 2 (two) times daily. 60 tablet 0   No current facility-administered medications for this visit.    Facility-Administered Medications Ordered in Other Visits  Medication Dose Route Frequency Provider Last Rate Last Dose  . mitoMYcin (MUTAMYCIN) Injection Use in OR only (0.4 mg/ml)  0.5 mL Right Eye Once Marylynn Pearson, MD        REVIEW OF SYSTEMS:   Constitutional: Denies fevers, chills or abnormal night sweats Eyes: Denies blurriness of vision, double vision or watery eyes Ears, nose, mouth, throat, and face: Denies mucositis or sore throat Respiratory: Denies cough, dyspnea or  wheezes Cardiovascular: Denies palpitation, chest discomfort or lower extremity swelling Gastrointestinal:  Denies nausea, heartburn or change in bowel habits Skin: Denies abnormal skin rashes Lymphatics: Denies new lymphadenopathy or easy bruising Neurological:Denies numbness, tingling or new weaknesses Behavioral/Psych: Mood is stable, no new changes  Breast: large palpable mass in the left breast and x-ray  All other systems were reviewed with the patient and are negative.  PHYSICAL EXAMINATION: ECOG PERFORMANCE STATUS: 2 - Symptomatic, <50% confined to bed  Vitals:   08/24/17 1231  BP: (!) 168/76  Pulse: 61  Resp: 18  Temp: 98.5 F (36.9 C)  SpO2: 100%   Filed Weights   08/24/17 1231  Weight: 121 lb 9.6 oz (55.2 kg)    GENERAL:alert, no distress and comfortable SKIN: skin color, texture, turgor are normal, no rashes or significant lesions EYES: normal, conjunctiva are pink and non-injected, sclera clear OROPHARYNX:no exudate, no erythema and lips, buccal mucosa, and tongue normal  NECK: supple, thyroid normal size, non-tender, without nodularity LYMPH:  no palpable lymphadenopathy in the cervical, axillary or inguinal LUNGS: clear to auscultation and percussion with normal breathing effort HEART: regular rate & rhythm and no murmurs and no lower extremity edema ABDOMEN:abdomen soft, non-tender and normal bowel sounds Musculoskeletal:no cyanosis of digits and no clubbing  PSYCH: alert & oriented x 3 with fluent speech NEURO: no focal motor/sensory deficits BREAST:arge palpable mass in the left breast which is freely mobile, left axillary lymph node was positive.. No palpable axillary or supraclavicular lymphadenopathy (exam performed in the presence of a chaperone)   LABORATORY DATA:  I have reviewed the data as listed Lab Results  Component Value Date   WBC 9.1 07/30/2017   HGB 12.6 07/30/2017   HCT 35.0 (L) 07/30/2017   MCV 83.3 07/30/2017   PLT 241 07/30/2017    Lab Results  Component Value Date   NA 140 07/30/2017   K 2.7 (LL) 07/30/2017   CL 101 07/30/2017   CO2 29 07/30/2017    RADIOGRAPHIC STUDIES: I have personally reviewed the radiological reports and agreed with the findings in the report.  ASSESSMENT AND PLAN:  Ductal carcinoma in situ (DCIS) of left breast 08/13/2017: Mammogram and ultrasound:Large left breast mass involving all quadrants measuring 7.5 cm with enlarged axillary lymph node, biopsy revealed high-grade  DCIS ER 40% PR 20%,, lymph node biopsy negative, Tis N0 stage 0  Pathology review: I discussed with the patient the difference between DCIS and invasive breast cancer. It is considered a precancerous lesion. DCIS is classified as a 0. It is generally detected through mammograms as calcifications. We discussed the significance of grades and its impact on prognosis. We also discussed the importance of ER and PR receptors and their implications to adjuvant treatment options. Prognosis of DCIS dependence on grade, comedo necrosis. It is anticipated that if not treated, 20-30% of DCIS can develop into invasive breast cancer.  Recommendation: 1.Left mastectomy 2. Followed by antiestrogen therapy with anastrozole5 years Patient history of  Stroke and coronary artery disease and therefore she did not good candidate for tamoxifen.  Return to clinic after surgery to discuss the final pathology report and come up with an adjuvant treatment plan.     All questions were answered. The patient knows to call the clinic with any problems, questions or concerns.    Rulon Eisenmenger, MD 08/24/17

## 2017-08-28 ENCOUNTER — Other Ambulatory Visit: Payer: Self-pay | Admitting: Surgery

## 2017-08-28 ENCOUNTER — Ambulatory Visit: Payer: Medicaid Other | Admitting: Family Medicine

## 2017-08-28 DIAGNOSIS — C50112 Malignant neoplasm of central portion of left female breast: Secondary | ICD-10-CM

## 2017-09-01 ENCOUNTER — Telehealth: Payer: Self-pay | Admitting: Hematology and Oncology

## 2017-09-01 NOTE — Telephone Encounter (Signed)
Spoke with patient and scheduled appt per 10/16 los.

## 2017-09-02 NOTE — Pre-Procedure Instructions (Addendum)
[name]  09/02/2017      [place], [place] [address] Phone: [phone] Fax: [place], [address] [place] [address] Phone: [phone] Fax: [phone]  [place], [place] - [address] [facility]. [address] [place] [address] Phone: [phone] Fax: [phone]    Your procedure is scheduled on October 23  Report to [facility] at [facility] [employer] A.M.  Call this number if you have problems the morning of surgery:  [phone]   Remember:  Do not eat food or drink liquids after midnight.  Please complete your 8oz of Boost Breezethat was given to you at your preadmission appointment at 0630 on the [place] all other medications as directed by your physician except follow these medication instructions before surgery   Take these medicines the morning of surgery with A SIP OF WATER  albuterol (PROVENTIL HFA;VENTOLIN HFA) amLODipine (NORVASC) carvedilol (COREG) nitroGLYCERIN (NITROSTAT) only if needed  7 days prior to surgery STOP taking any Aspirin (unless otherwise instructed by your surgeon), Aleve, Naproxen, Ibuprofen, Motrin, Advil, Goody's, BC's, all herbal medications, fish oil, and all vitamins    Do not wear jewelry, make-up or nail polish.  Do not wear lotions, powders, or perfumes, or deoderant.  Do not shave 48 hours prior to surgery.   Do not bring valuables to the hospital.  [facility] is not responsible for any belongings or valuables.  Contacts, dentures or bridgework may not be worn into surgery.  Leave your suitcase in the car.  After surgery it may be brought to your room.  For patients admitted to the hospital, discharge time will be determined by your treatment team.  Patients discharged the day of surgery will not be allowed to drive  home.    Special instructions:   [place]- Preparing For Surgery  Before surgery, you can play an important role. Because skin is not sterile, your skin needs to be as free of germs as possible. You can reduce the number of germs on your skin by washing with CHG (chlorahexidine gluconate) Soap before surgery.  CHG is an antiseptic cleaner which kills germs and bonds with the skin to continue killing germs even after washing.  Please do not use if you have an allergy to CHG or antibacterial soaps. If your skin becomes reddened/irritated stop using the CHG.  Do not shave (including legs and underarms) for at least 48 hours prior to first CHG shower. It is OK to shave your face.  Please follow these instructions carefully.   1. Shower the NIGHT BEFORE SURGERY and the MORNING OF SURGERY with CHG.   2. If you chose to wash your hair, wash your hair first as usual with your normal shampoo.  3. After you shampoo, rinse your hair and body thoroughly to remove the shampoo.  4. Use CHG as you would any other liquid soap. You can apply CHG directly to the skin and wash gently with a scrungie or a clean washcloth.   5. Apply the CHG Soap to your body ONLY FROM THE NECK DOWN.  Do not use on open wounds or open sores. Avoid contact with your eyes, ears, mouth and genitals (private parts). Wash Face and genitals (private parts)  with your normal soap.  6. Wash thoroughly, paying  special attention to the area where your surgery will be performed.  7. Thoroughly rinse your body with warm water from the neck down.  8. DO NOT shower/wash with your normal soap after using and rinsing off the CHG Soap.  9. Pat yourself dry with a CLEAN TOWEL.  10. Wear CLEAN PAJAMAS to bed the night before surgery, wear comfortable clothes the morning of surgery  11. Place CLEAN SHEETS on your bed the night of your first shower and DO NOT SLEEP WITH PETS.    Day of Surgery: Do not apply any deodorants/lotions.  Please wear clean clothes to the hospital/surgery center.      Please read over the following fact sheets that you were given.

## 2017-09-03 ENCOUNTER — Encounter (HOSPITAL_COMMUNITY): Payer: Self-pay

## 2017-09-03 ENCOUNTER — Encounter (HOSPITAL_COMMUNITY)
Admission: RE | Admit: 2017-09-03 | Discharge: 2017-09-03 | Disposition: A | Discharge status: Home / Self Care | Payer: Medicaid Other | Source: Ambulatory Visit | Attending: Surgery | Admitting: Surgery

## 2017-09-03 DIAGNOSIS — I10 Essential (primary) hypertension: Secondary | ICD-10-CM | POA: Diagnosis not present

## 2017-09-03 DIAGNOSIS — G8929 Other chronic pain: Secondary | ICD-10-CM | POA: Diagnosis not present

## 2017-09-03 DIAGNOSIS — D0592 Unspecified type of carcinoma in situ of left breast: Secondary | ICD-10-CM | POA: Diagnosis not present

## 2017-09-03 DIAGNOSIS — G40909 Epilepsy, unspecified, not intractable, without status epilepticus: Secondary | ICD-10-CM | POA: Insufficient documentation

## 2017-09-03 DIAGNOSIS — M549 Dorsalgia, unspecified: Secondary | ICD-10-CM | POA: Insufficient documentation

## 2017-09-03 DIAGNOSIS — I69951 Hemiplegia and hemiparesis following unspecified cerebrovascular disease affecting right dominant side: Secondary | ICD-10-CM | POA: Insufficient documentation

## 2017-09-03 DIAGNOSIS — Z01812 Encounter for preprocedural laboratory examination: Secondary | ICD-10-CM | POA: Diagnosis not present

## 2017-09-03 DIAGNOSIS — J449 Chronic obstructive pulmonary disease, unspecified: Secondary | ICD-10-CM | POA: Insufficient documentation

## 2017-09-03 DIAGNOSIS — E785 Hyperlipidemia, unspecified: Secondary | ICD-10-CM | POA: Diagnosis not present

## 2017-09-03 DIAGNOSIS — I2510 Atherosclerotic heart disease of native coronary artery without angina pectoris: Secondary | ICD-10-CM | POA: Diagnosis not present

## 2017-09-03 HISTORY — DX: Unspecified disorder of eye and adnexa: H57.9

## 2017-09-03 LAB — COMPREHENSIVE METABOLIC PANEL
ALBUMIN: 4.6 g/dL (ref 3.5–5.0)
ALT: 13 U/L — ABNORMAL LOW (ref 14–54)
AST: 20 U/L (ref 15–41)
Alkaline Phosphatase: 46 U/L (ref 38–126)
Anion gap: 10 (ref 5–15)
BUN: 9 mg/dL (ref 6–20)
CHLORIDE: 102 mmol/L (ref 101–111)
CO2: 24 mmol/L (ref 22–32)
Calcium: 9.7 mg/dL (ref 8.9–10.3)
Creatinine, Ser: 0.8 mg/dL (ref 0.44–1.00)
GFR calc Af Amer: >60 mL/min (ref >60)
Glucose, Bld: 94 mg/dL (ref 65–99)
POTASSIUM: 3.7 mmol/L (ref 3.5–5.1)
Sodium: 136 mmol/L (ref 135–145)
TOTAL PROTEIN: 7.9 g/dL (ref 6.5–8.1)
Total Bilirubin: 0.8 mg/dL (ref 0.3–1.2)

## 2017-09-03 LAB — CBC
HEMATOCRIT: 39.1 % (ref 36.0–46.0)
Hemoglobin: 14 g/dL (ref 12.0–15.0)
MCH: 30.2 pg (ref 26.0–34.0)
MCHC: 35.8 g/dL (ref 30.0–36.0)
MCV: 84.4 fL (ref 78.0–100.0)
PLATELETS: 185 10*3/uL (ref 150–400)
RBC: 4.63 MIL/uL (ref 3.87–5.11)
RDW: 13.6 % (ref 11.5–15.5)
WBC: 9.4 10*3/uL (ref 4.0–10.5)

## 2017-09-03 NOTE — Progress Notes (Addendum)
PCP - Sela Hilding Cardiologist - Harwani  Chest x-ray - 03/14/17 EKG - 03/14/17 Stress Test - 03/2017 ECHO - 2008 Cardiac Cath 2009 -   Sending to anesthesia for review of records Last dose of Aspirin and Plavix to be 10/19   Patient denies shortness of breath, fever, cough and chest pain at PAT appointment   Patient verbalized understanding of instructions that were given to them at the PAT appointment. Patient was also instructed that they will need to review over the PAT instructions again at home before surgery.

## 2017-09-04 NOTE — Progress Notes (Signed)
Anesthesia Chart Review: Patient is a 61 year old female scheduled for left mastectomy with sentinel lymph node biopsy on 09/08/17 by Dr. [name]. Anesthesia posted as general with pectoral block.  History includes smoking, left breast DCIS, CAD/MI s/p RCA stent '09, HTN, PTSD, adjustment disorder with depression, COPD, seizure disorder (last '16), CVA with right hemiparesis '08, HLD, asthma, chronic back pain, Hepatitis C s/p [place]voni treatment and cure (03/10/17), tonsillectomy, glaucoma (with decreased vision), bilateral eye trabeculectomies '14, right eye cataract extraction 07/11/15, prior cocaine use (last use "15 years ago"), quit ETOH 4 years ago.  - PCP is Dr. Sela Hilding at the [facility]. - Cardiologist is Dr. Charolette Forward at [facility]. Last visit 06/26/17.  - HEM-ONC is Dr. [name]. RAD-ONC is Dr. [name].  Meds include albuterol, amlodipine, ASA 81 mg, Lipitor, Coreg, Plavix, Nitro, KCl. Per posting, patient to hold Plavix for three days prior to surgery. Last ASA/Plavix 09/04/17.  BP (!) 155/67   Pulse 67   Temp (!) 36.4 C   Resp 18   Ht 5' 4.5" (1.638 m)   Wt 113 lb 11.2 oz (51.6 kg)   SpO2 100%   BMI 19.22 kg/m   EKG 03/17/17 (Advance CV): SB at 50 bpm.   Nuclear stress test 04/06/17: IMPRESSION: 1. No reversible ischemia or infarction. 2. Normal left ventricular wall motion. 3. Left ventricular ejection fraction 62% 4. Non invasive risk stratification*: Low  Cardiac cath on 12/19/07: FINDINGS:  LV showed good LV systolic function. There was mild inferior wall hypokinesia, EF of 50-55% left main was patent. LAD has 10-15% proximal and mid stenosis. Diagonal one was small, diagonal two has 30- 40% proximal stenosis, this vessel was also small. Left circumflex has 15-20% mid stenosis. OM1 to OM3 were very, very small. OM4 has 30-40% stenosis. RCA has proximal and mid junction 99% stenosis and  30-40% mid stenosis. PDA is very small. PLV branches are patent.  PCI: Successful PTCA to proximal and mid junction of RCA was done using 3.0 x 12 mm long Voyager balloon for predilatation and then 3.5 x 23 mm long Promus drug eluting stent was deployed in mid and proximal and mid junction of RCA.  Echo 04/19/07: SUMMARY - Overall left ventricular systolic function was normal. Left    ventricular ejection fraction was estimated to be 60 %. There    were no left ventricular regional wall motion abnormalities.    Left ventricular wall thickness was mildly increased. Left    ventricular diastolic function parameters were normal. - Aortic valve thickness was mildly increased.  CXR 03/14/17: IMPRESSION: Stable examination:  No acute cardiopulmonary process.  Preoperative labs noted.  She denied SOB, CP, fever, cough at PAT. She had a negative stress test earlier this year and cardiology follow-up within the past three months. If no acute changes then I would anticipate that she could proceed as planned.  George Hugh [facility] Short Stay Center/Anesthesiology Phone [phone] 09/04/2017 12:57 PM

## 2017-09-07 NOTE — Progress Notes (Signed)
Pt notified of time change per OR/Dr. Trevor Mace request.  Pt now to arrive at [facility] to have Boost drink completed by 5am. Pt verbalized understanding.

## 2017-09-07 NOTE — H&P (Signed)
[name] 08/10/2017 2:10 PM Location: [facility] Patient #: [id number] DOB: 1956-09-28 Single / Language: [name] / Race: Black or African American Female   History of Present Illness (Morgan Valdes A. Ninfa Linden MD; 08/10/2017 2:40 PM) The patient is a 61 year old female who presents with a breast mass. This is a 61 year old female referred by Dr. Gabriel [name] for evaluation of a left breast mass. The patient has multiple comorbidities. She has felt a mass for at least 2 or 3 years. She had a mammogram and ultrasound showing a 7 x 7 x 7 cm mass in the left breast worrisome for malignancy as well as enlarged lymph nodes. She is scheduled for biopsy of this by the radiologist. She is reporting some nipple discharge. She has had a previous stroke as well as myocardial infarction. She has multiple other medical issues as well.    Past Surgical History ([name], [place]; 08/10/2017 2:10 PM) Tonsillectomy   Diagnostic Studies History ([name], [place]; 08/10/2017 2:10 PM) Pap Smear  >5 years ago  Allergies ([name], [place]; 08/10/2017 2:11 PM) No Known Drug Allergies 08/10/2017 Allergies Reconciled   Medication History ([name], RMA; 08/10/2017 2:16 PM) Albuterol Sulfate ((2.5 MG/3ML)0.083% Nebulized Soln, Inhalation) Active. AmLODIPine Besylate (5MG  Tablet, Oral) Active. Aspirin (81MG  Tablet, Oral) Active. Atorvastatin Calcium (40MG  Tablet, Oral) Active. Carvedilol (12.5MG  Tablet, Oral) Active. Clopidogrel Bisulfate (75MG  Tablet, Oral) Active. Nitroglycerin (0.4MG  Tab Sublingual, Sublingual) Active. Potassium Chloride (20MEQ Packet, Oral) Active. Medications Reconciled  Social History ([name], [place]; 08/10/2017 2:10 PM) Alcohol use  Remotely quit alcohol use. Caffeine use  Carbonated beverages, Coffee, Tea. Illicit drug use  Remotely quit drug use. Tobacco use  Former smoker.  Family History ([name], [place]; 08/10/2017  2:10 PM) Alcohol Abuse  Father. Cerebrovascular Accident  Father. Seizure disorder  Father.  Pregnancy / Birth History ([name], [place]; 08/10/2017 2:10 PM) Age at menarche  48 years. Age of menopause  33-50 Maternal age  95-35 Para  2  Other Problems ([name], [place]; 08/10/2017 2:10 PM) Breast Cancer  High blood pressure     Review of Systems (Morgan A. Brown RMA; 08/10/2017 2:10 PM) General Present- Appetite Loss and Night Sweats. Not Present- Chills, Fatigue, Fever, Weight Gain and Weight Loss. Skin Not Present- Change in Wart/Mole, Dryness, Hives, Jaundice, New Lesions, Non-Healing Wounds, Rash and Ulcer. HEENT Not Present- Earache, Hearing Loss, Hoarseness, Nose Bleed, Oral Ulcers, Ringing in the Ears, Seasonal Allergies, Sinus Pain, Sore Throat, Visual Disturbances, Wears glasses/contact lenses and Yellow Eyes. Respiratory Present- Bloody sputum. Not Present- Chronic Cough, Difficulty Breathing, Snoring and Wheezing. Breast Present- Nipple Discharge. Not Present- Breast Mass, Breast Pain and Skin Changes. Gastrointestinal Present- Gets full quickly at meals. Not Present- Abdominal Pain, Bloating, Bloody Stool, Change in Bowel Habits, Chronic diarrhea, Constipation, Difficulty Swallowing, Excessive gas, Hemorrhoids, Indigestion, Nausea, Rectal Pain and Vomiting. Female Genitourinary Not Present- Frequency, Nocturia, Painful Urination, Pelvic Pain and Urgency. Neurological Present- Headaches. Not Present- Decreased Memory, Fainting, Numbness, Seizures, Tingling, Tremor, Trouble walking and Weakness.  Vitals (Morgan A. Brown RMA; 08/10/2017 2:11 PM) 08/10/2017 2:11 PM Weight: 118.2 lb Height: 64in Body Surface Area: 1.56 m Body Mass Index: 20.29 kg/m  Temp.: 98.37F  Pulse: 71 (Regular)  P.OX: 97% (Room air) BP: 118/72 (Sitting, Left Arm, Standard)       Physical Exam ([name] A. Ninfa Linden MD; 08/10/2017 2:43 PM) General General  Appearance-Consistent with stated age. Hydration-Well hydrated. Voice-Normal. Note: She appears  older than her stated age. She has a flat affect   Head and Neck Head-normocephalic, atraumatic with no lesions or palpable masses. Trachea-midline. Thyroid Gland Characteristics - normal size and consistency.  Eye Eyeball - Bilateral-Extraocular movements intact. Sclera/Conjunctiva - Bilateral-No scleral icterus.  Chest and Lung Exam Chest and lung exam reveals -quiet, even and easy respiratory effort with no use of accessory muscles and on auscultation, normal breath sounds, no adventitious sounds and normal vocal resonance. Inspection Chest Wall - Normal. Back - normal.  Breast Breast - Left-Non Tender, No Biopsy scars, no Dimpling, No Inflammation, No Lumpectomy scars, No Mastectomy scars, No Peau d' Orange. Note: There is a large, mobile mass of her left breast in the upper outer and inner quadrant and going underneath the areola. I cannot palpate any adenopathy. Breast - Right-Symmetric, Non Tender, No Biopsy scars, no Dimpling, No Inflammation, No Lumpectomy scars, No Mastectomy scars, No Peau d' Orange. Breast Lump-No Palpable Breast Mass.  Cardiovascular Cardiovascular examination reveals -normal heart sounds, regular rate and rhythm with no murmurs and normal pedal pulses bilaterally.  Abdomen Inspection Inspection of the abdomen reveals - No Hernias. Skin - Scar - no surgical scars. Palpation/Percussion Palpation and Percussion of the abdomen reveal - Soft, Non Tender, No Rebound tenderness, No Rigidity (guarding) and No hepatosplenomegaly. Auscultation Auscultation of the abdomen reveals - Bowel sounds normal.  Neurologic - Did not examine.  Musculoskeletal - Did not examine.  Lymphatic Head & Neck  General Head & Neck Lymphatics: Bilateral - Description - Normal. Axillary  General Axillary Region: Bilateral - Description - Normal.  Tenderness - Non Tender. Femoral & Inguinal  Generalized Femoral & Inguinal Lymphatics: Bilateral - Description - Normal. Tenderness - Non Tender.    Assessment & Plan (Neamiah Sciarra A. Ninfa Linden MD; 08/10/2017 2:45 PM) LARGE MASS OF LEFT BREAST (N63.20) Impression: This is a patient with a large left breast mass and adenopathy on mammogram and ultrasound that is worrisome for malignancy. She currently would only be a candidate for mastectomy without neoadjuvant therapy. She is scheduled for a biopsy of this to prove whether or not it is malignant. We will tentatively refer her to medical and radiation oncology as well. Unfortunately, she has a lot of significant comorbidities. She did not seem to completely understand all the going on there were no family members present. I will see her back after the biopsy is completed and we know the results before deciding things further, which way to proceed Current Plans Addendum: The patient is a 60 year old female who presents with breast cancer. She is here for another follow-up regarding her left breast cancer. She is now seen both medical and radiation oncology who are both recommending a left mastectomy given the size of the mass. She is accompanied by family members today. She has no complaints. She is in agreement with both medical and radiation oncology as well as my opinion that she should have a left mastectomy and sentinel lymph node biopsy. I discussed procedure with the patient and her family. We also discussed immediate reconstruction. She has declined reconstruction at this point. She again has a lot of medical issues and is on Plavix for heart stents. We will get cardiac clearance. We will tentatively schedule her for a left mastectomy and sentinel lymph node biopsy. I discussed the risks of surgery which includes but is not limited to bleeding, infection, need for further surgery if nodes are positive, injury to surrounding structures,  cardiopulmonary issues, etc. She understands and  wishes to proceed with surgery

## 2017-09-08 ENCOUNTER — Observation Stay (HOSPITAL_COMMUNITY)
Admission: RE | Admit: 2017-09-08 | Discharge: 2017-09-09 | Disposition: A | Discharge status: Home / Self Care | Payer: Medicaid Other | Source: Ambulatory Visit | Attending: Surgery | Admitting: Surgery

## 2017-09-08 ENCOUNTER — Encounter (HOSPITAL_COMMUNITY)
Admission: RE | Admit: 2017-09-08 | Discharge: 2017-09-08 | Disposition: A | Discharge status: Home / Self Care | Payer: Medicaid Other | Source: Ambulatory Visit | Attending: Surgery | Admitting: Surgery

## 2017-09-08 ENCOUNTER — Ambulatory Visit (HOSPITAL_COMMUNITY): Payer: Medicaid Other | Admitting: Emergency Medicine

## 2017-09-08 ENCOUNTER — Encounter (HOSPITAL_COMMUNITY): Payer: Self-pay | Admitting: *Deleted

## 2017-09-08 ENCOUNTER — Encounter (HOSPITAL_COMMUNITY)
Admission: RE | Disposition: A | Discharge status: Home / Self Care | Payer: Self-pay | Source: Ambulatory Visit | Attending: Surgery

## 2017-09-08 ENCOUNTER — Ambulatory Visit (HOSPITAL_COMMUNITY): Payer: Medicaid Other | Admitting: Anesthesiology

## 2017-09-08 DIAGNOSIS — Z87891 Personal history of nicotine dependence: Secondary | ICD-10-CM | POA: Insufficient documentation

## 2017-09-08 DIAGNOSIS — C50912 Malignant neoplasm of unspecified site of left female breast: Secondary | ICD-10-CM | POA: Diagnosis not present

## 2017-09-08 DIAGNOSIS — C50112 Malignant neoplasm of central portion of left female breast: Secondary | ICD-10-CM | POA: Insufficient documentation

## 2017-09-08 DIAGNOSIS — Z79899 Other long term (current) drug therapy: Secondary | ICD-10-CM | POA: Diagnosis not present

## 2017-09-08 DIAGNOSIS — Z7902 Long term (current) use of antithrombotics/antiplatelets: Secondary | ICD-10-CM | POA: Diagnosis not present

## 2017-09-08 DIAGNOSIS — Z8673 Personal history of transient ischemic attack (TIA), and cerebral infarction without residual deficits: Secondary | ICD-10-CM | POA: Diagnosis not present

## 2017-09-08 DIAGNOSIS — Z955 Presence of coronary angioplasty implant and graft: Secondary | ICD-10-CM | POA: Diagnosis not present

## 2017-09-08 DIAGNOSIS — C773 Secondary and unspecified malignant neoplasm of axilla and upper limb lymph nodes: Secondary | ICD-10-CM | POA: Diagnosis not present

## 2017-09-08 DIAGNOSIS — I252 Old myocardial infarction: Secondary | ICD-10-CM | POA: Insufficient documentation

## 2017-09-08 DIAGNOSIS — I10 Essential (primary) hypertension: Secondary | ICD-10-CM | POA: Insufficient documentation

## 2017-09-08 DIAGNOSIS — Z7982 Long term (current) use of aspirin: Secondary | ICD-10-CM | POA: Diagnosis not present

## 2017-09-08 DIAGNOSIS — I2510 Atherosclerotic heart disease of native coronary artery without angina pectoris: Secondary | ICD-10-CM | POA: Insufficient documentation

## 2017-09-08 DIAGNOSIS — J449 Chronic obstructive pulmonary disease, unspecified: Secondary | ICD-10-CM | POA: Diagnosis not present

## 2017-09-08 DIAGNOSIS — G40909 Epilepsy, unspecified, not intractable, without status epilepticus: Secondary | ICD-10-CM | POA: Insufficient documentation

## 2017-09-08 DIAGNOSIS — D0512 Intraductal carcinoma in situ of left breast: Secondary | ICD-10-CM | POA: Diagnosis present

## 2017-09-08 HISTORY — PX: MASTECTOMY W/ SENTINEL NODE BIOPSY: SHX2001

## 2017-09-08 SURGERY — MASTECTOMY WITH SENTINEL LYMPH NODE BIOPSY
Anesthesia: General | Site: Breast | Laterality: Left

## 2017-09-08 MED ORDER — CARVEDILOL 12.5 MG PO TABS
12.5000 mg | ORAL_TABLET | Freq: Once | ORAL | Status: AC
  Administered 2017-09-08: 12.5 mg via ORAL
  Filled 2017-09-08 (×2): qty 1

## 2017-09-08 MED ORDER — LIDOCAINE 2% (20 MG/ML) 5 ML SYRINGE
INTRAMUSCULAR | Status: AC
  Filled 2017-09-08: qty 5

## 2017-09-08 MED ORDER — PROPOFOL 10 MG/ML IV BOLUS
INTRAVENOUS | Status: DC | PRN
  Administered 2017-09-08: 10 mg via INTRAVENOUS

## 2017-09-08 MED ORDER — AMLODIPINE BESYLATE 10 MG PO TABS
10.0000 mg | ORAL_TABLET | Freq: Every day | ORAL | Status: DC
  Administered 2017-09-08: 5 mg via ORAL
  Administered 2017-09-09: 10 mg via ORAL
  Filled 2017-09-08 (×2): qty 1

## 2017-09-08 MED ORDER — MIDAZOLAM HCL 2 MG/2ML IJ SOLN
2.0000 mg | Freq: Once | INTRAMUSCULAR | Status: AC
  Administered 2017-09-08: 2 mg via INTRAVENOUS

## 2017-09-08 MED ORDER — AMLODIPINE BESYLATE 5 MG PO TABS
ORAL_TABLET | ORAL | Status: AC
  Administered 2017-09-08: 5 mg via ORAL
  Filled 2017-09-08: qty 1

## 2017-09-08 MED ORDER — FENTANYL CITRATE (PF) 100 MCG/2ML IJ SOLN
100.0000 ug | Freq: Once | INTRAMUSCULAR | Status: AC
  Administered 2017-09-08: 100 ug via INTRAVENOUS

## 2017-09-08 MED ORDER — FENTANYL CITRATE (PF) 250 MCG/5ML IJ SOLN
INTRAMUSCULAR | Status: AC
  Filled 2017-09-08: qty 5

## 2017-09-08 MED ORDER — POTASSIUM CHLORIDE IN NACL 20-0.9 MEQ/L-% IV SOLN
INTRAVENOUS | Status: DC
  Administered 2017-09-08 – 2017-09-09 (×2): via INTRAVENOUS
  Filled 2017-09-08 (×2): qty 1000

## 2017-09-08 MED ORDER — ACETAMINOPHEN 500 MG PO TABS
ORAL_TABLET | ORAL | Status: AC
  Filled 2017-09-08: qty 2

## 2017-09-08 MED ORDER — GABAPENTIN 300 MG PO CAPS
300.0000 mg | ORAL_CAPSULE | Freq: Once | ORAL | Status: AC
  Administered 2017-09-08: 300 mg via ORAL
  Filled 2017-09-08: qty 1

## 2017-09-08 MED ORDER — TRAMADOL HCL 50 MG PO TABS: 50.0000 mg | ORAL_TABLET | Freq: Four times a day (QID) | ORAL | Status: DC | PRN

## 2017-09-08 MED ORDER — TECHNETIUM TC 99M SULFUR COLLOID FILTERED
1.0000 | Freq: Once | INTRAVENOUS | Status: AC | PRN
  Administered 2017-09-08: 1 via INTRADERMAL

## 2017-09-08 MED ORDER — 0.9 % SODIUM CHLORIDE (POUR BTL) OPTIME
TOPICAL | Status: DC | PRN
  Administered 2017-09-08: 1000 mL

## 2017-09-08 MED ORDER — ALBUTEROL SULFATE (2.5 MG/3ML) 0.083% IN NEBU: 2.5000 mg | INHALATION_SOLUTION | Freq: Four times a day (QID) | RESPIRATORY_TRACT | Status: DC | PRN

## 2017-09-08 MED ORDER — ENOXAPARIN SODIUM 40 MG/0.4ML ~~LOC~~ SOLN
40.0000 mg | SUBCUTANEOUS | Status: DC
  Administered 2017-09-09: 40 mg via SUBCUTANEOUS
  Filled 2017-09-08: qty 0.4

## 2017-09-08 MED ORDER — FENTANYL CITRATE (PF) 100 MCG/2ML IJ SOLN
INTRAMUSCULAR | Status: AC
  Administered 2017-09-08: 100 ug via INTRAVENOUS
  Filled 2017-09-08: qty 2

## 2017-09-08 MED ORDER — LACTATED RINGERS IV SOLN
INTRAVENOUS | Status: DC
  Administered 2017-09-08: 08:00:00 via INTRAVENOUS

## 2017-09-08 MED ORDER — ASPIRIN EC 81 MG PO TBEC
81.0000 mg | DELAYED_RELEASE_TABLET | Freq: Every day | ORAL | Status: DC
  Administered 2017-09-08 – 2017-09-09 (×2): 81 mg via ORAL
  Filled 2017-09-08 (×2): qty 1

## 2017-09-08 MED ORDER — FENTANYL CITRATE (PF) 100 MCG/2ML IJ SOLN
INTRAMUSCULAR | Status: DC | PRN
  Administered 2017-09-08: 50 ug via INTRAVENOUS

## 2017-09-08 MED ORDER — CARVEDILOL 12.5 MG PO TABS
12.5000 mg | ORAL_TABLET | Freq: Two times a day (BID) | ORAL | Status: DC
  Administered 2017-09-08 – 2017-09-09 (×2): 12.5 mg via ORAL
  Filled 2017-09-08 (×2): qty 1

## 2017-09-08 MED ORDER — NITROGLYCERIN 0.4 MG SL SUBL: 0.4000 mg | SUBLINGUAL_TABLET | SUBLINGUAL | Status: DC | PRN

## 2017-09-08 MED ORDER — SUCCINYLCHOLINE CHLORIDE 200 MG/10ML IV SOSY
PREFILLED_SYRINGE | INTRAVENOUS | Status: AC
  Filled 2017-09-08: qty 10

## 2017-09-08 MED ORDER — DEXAMETHASONE SODIUM PHOSPHATE 10 MG/ML IJ SOLN
INTRAMUSCULAR | Status: DC | PRN
  Administered 2017-09-08: 10 mg via INTRAVENOUS

## 2017-09-08 MED ORDER — METHYLENE BLUE 0.5 % INJ SOLN
INTRAVENOUS | Status: AC
  Filled 2017-09-08: qty 10

## 2017-09-08 MED ORDER — MEPERIDINE HCL 25 MG/ML IJ SOLN: 6.2500 mg | INTRAMUSCULAR | Status: DC | PRN

## 2017-09-08 MED ORDER — ONDANSETRON HCL 4 MG/2ML IJ SOLN
INTRAMUSCULAR | Status: AC
  Filled 2017-09-08: qty 2

## 2017-09-08 MED ORDER — MIDAZOLAM HCL 2 MG/2ML IJ SOLN
INTRAMUSCULAR | Status: AC
  Administered 2017-09-08: 2 mg via INTRAVENOUS
  Filled 2017-09-08: qty 2

## 2017-09-08 MED ORDER — DEXAMETHASONE SODIUM PHOSPHATE 10 MG/ML IJ SOLN
INTRAMUSCULAR | Status: AC
  Filled 2017-09-08: qty 1

## 2017-09-08 MED ORDER — PROPOFOL 10 MG/ML IV BOLUS
INTRAVENOUS | Status: AC
  Filled 2017-09-08: qty 20

## 2017-09-08 MED ORDER — CHLORHEXIDINE GLUCONATE CLOTH 2 % EX PADS: 6.0000 | MEDICATED_PAD | Freq: Once | CUTANEOUS | Status: DC

## 2017-09-08 MED ORDER — ONDANSETRON 4 MG PO TBDP: 4.0000 mg | ORAL_TABLET | Freq: Four times a day (QID) | ORAL | Status: DC | PRN

## 2017-09-08 MED ORDER — HYDROMORPHONE HCL 1 MG/ML IJ SOLN: 0.2500 mg | INTRAMUSCULAR | Status: DC | PRN

## 2017-09-08 MED ORDER — ONDANSETRON HCL 4 MG/2ML IJ SOLN
INTRAMUSCULAR | Status: DC | PRN
  Administered 2017-09-08: 4 mg via INTRAVENOUS

## 2017-09-08 MED ORDER — EPHEDRINE 5 MG/ML INJ
INTRAVENOUS | Status: AC
  Filled 2017-09-08: qty 10

## 2017-09-08 MED ORDER — OXYCODONE HCL 5 MG PO TABS
5.0000 mg | ORAL_TABLET | ORAL | Status: DC | PRN
  Administered 2017-09-08 – 2017-09-09 (×2): 10 mg via ORAL
  Filled 2017-09-08 (×2): qty 2

## 2017-09-08 MED ORDER — ALBUTEROL SULFATE HFA 108 (90 BASE) MCG/ACT IN AERS: 2.0000 | INHALATION_SPRAY | Freq: Four times a day (QID) | RESPIRATORY_TRACT | Status: DC | PRN

## 2017-09-08 MED ORDER — GABAPENTIN 300 MG PO CAPS
ORAL_CAPSULE | ORAL | Status: AC
  Administered 2017-09-08: 300 mg via ORAL
  Filled 2017-09-08: qty 1

## 2017-09-08 MED ORDER — SUGAMMADEX SODIUM 200 MG/2ML IV SOLN
INTRAVENOUS | Status: AC
  Filled 2017-09-08: qty 2

## 2017-09-08 MED ORDER — CEFAZOLIN SODIUM-DEXTROSE 2-4 GM/100ML-% IV SOLN
2.0000 g | INTRAVENOUS | Status: AC
  Administered 2017-09-08: 2 g via INTRAVENOUS
  Filled 2017-09-08: qty 100

## 2017-09-08 MED ORDER — PROMETHAZINE HCL 25 MG/ML IJ SOLN: 6.2500 mg | INTRAMUSCULAR | Status: DC | PRN

## 2017-09-08 MED ORDER — MIDAZOLAM HCL 2 MG/2ML IJ SOLN
INTRAMUSCULAR | Status: AC
  Filled 2017-09-08: qty 2

## 2017-09-08 MED ORDER — AMLODIPINE BESYLATE 5 MG PO TABS
5.0000 mg | ORAL_TABLET | ORAL | Status: AC
  Administered 2017-09-08: 5 mg via ORAL

## 2017-09-08 MED ORDER — ONDANSETRON HCL 4 MG/2ML IJ SOLN: 4.0000 mg | Freq: Four times a day (QID) | INTRAMUSCULAR | Status: DC | PRN

## 2017-09-08 MED ORDER — DIPHENHYDRAMINE HCL 12.5 MG/5ML PO ELIX: 12.5000 mg | ORAL_SOLUTION | Freq: Four times a day (QID) | ORAL | Status: DC | PRN

## 2017-09-08 MED ORDER — ACETAMINOPHEN 500 MG PO TABS
1000.0000 mg | ORAL_TABLET | Freq: Once | ORAL | Status: DC
  Filled 2017-09-08: qty 2

## 2017-09-08 MED ORDER — MIDAZOLAM HCL 2 MG/2ML IJ SOLN: 0.5000 mg | Freq: Once | INTRAMUSCULAR | Status: DC | PRN

## 2017-09-08 MED ORDER — DIPHENHYDRAMINE HCL 50 MG/ML IJ SOLN: 12.5000 mg | Freq: Four times a day (QID) | INTRAMUSCULAR | Status: DC | PRN

## 2017-09-08 MED ORDER — ROCURONIUM BROMIDE 10 MG/ML (PF) SYRINGE
PREFILLED_SYRINGE | INTRAVENOUS | Status: AC
  Filled 2017-09-08: qty 5

## 2017-09-08 MED ORDER — SODIUM CHLORIDE 0.9 % IJ SOLN
INTRAMUSCULAR | Status: AC
  Filled 2017-09-08: qty 10

## 2017-09-08 MED ORDER — MORPHINE SULFATE (PF) 4 MG/ML IV SOLN
1.0000 mg | INTRAVENOUS | Status: DC | PRN
  Administered 2017-09-08: 4 mg via INTRAVENOUS
  Filled 2017-09-08: qty 1

## 2017-09-08 MED ORDER — PHENYLEPHRINE 40 MCG/ML (10ML) SYRINGE FOR IV PUSH (FOR BLOOD PRESSURE SUPPORT)
PREFILLED_SYRINGE | INTRAVENOUS | Status: AC
  Filled 2017-09-08: qty 10

## 2017-09-08 MED ORDER — EPHEDRINE SULFATE 50 MG/ML IJ SOLN
INTRAMUSCULAR | Status: DC | PRN
  Administered 2017-09-08 (×2): 5 mg via INTRAVENOUS

## 2017-09-08 SURGICAL SUPPLY — 38 items
APPLIER CLIP 9.375 MED OPEN (MISCELLANEOUS) ×2
BINDER BREAST LRG (GAUZE/BANDAGES/DRESSINGS) IMPLANT
CANISTER SUCT 3000ML PPV (MISCELLANEOUS) ×2 IMPLANT
CHLORAPREP W/TINT 26ML (MISCELLANEOUS) ×2 IMPLANT
CLIP APPLIE 9.375 MED OPEN (MISCELLANEOUS) ×1 IMPLANT
CONT SPEC 4OZ CLIKSEAL STRL BL (MISCELLANEOUS) ×2 IMPLANT
COVER PROBE W GEL 5X96 (DRAPES) ×2 IMPLANT
COVER SURGICAL LIGHT HANDLE (MISCELLANEOUS) ×2 IMPLANT
DERMABOND ADVANCED (GAUZE/BANDAGES/DRESSINGS) ×1
DERMABOND ADVANCED .7 DNX12 (GAUZE/BANDAGES/DRESSINGS) ×1 IMPLANT
DRAIN CHANNEL 19F RND (DRAIN) ×2 IMPLANT
DRAPE LAPAROSCOPIC ABDOMINAL (DRAPES) ×2 IMPLANT
DRAPE UTILITY XL STRL (DRAPES) ×4 IMPLANT
ELECT REM PT RETURN 9FT ADLT (ELECTROSURGICAL) ×2
ELECTRODE REM PT RTRN 9FT ADLT (ELECTROSURGICAL) ×1 IMPLANT
EVACUATOR SILICONE 100CC (DRAIN) ×2 IMPLANT
GAUZE SPONGE 4X4 12PLY STRL (GAUZE/BANDAGES/DRESSINGS) ×2 IMPLANT
GLOVE SURG SIGNA 7.5 PF LTX (GLOVE) ×2 IMPLANT
GOWN STRL REUS W/ TWL LRG LVL3 (GOWN DISPOSABLE) ×2 IMPLANT
GOWN STRL REUS W/ TWL XL LVL3 (GOWN DISPOSABLE) ×1 IMPLANT
GOWN STRL REUS W/TWL LRG LVL3 (GOWN DISPOSABLE) ×2
GOWN STRL REUS W/TWL XL LVL3 (GOWN DISPOSABLE) ×1
KIT BASIN OR (CUSTOM PROCEDURE TRAY) ×2 IMPLANT
KIT ROOM TURNOVER OR (KITS) ×2 IMPLANT
NEEDLE FILTER BLUNT 18X 1/2SAF (NEEDLE)
NEEDLE FILTER BLUNT 18X1 1/2 (NEEDLE) IMPLANT
NS IRRIG 1000ML POUR BTL (IV SOLUTION) ×2 IMPLANT
PACK GENERAL/GYN (CUSTOM PROCEDURE TRAY) ×2 IMPLANT
PAD ABD 8X10 STRL (GAUZE/BANDAGES/DRESSINGS) ×2 IMPLANT
PAD ARMBOARD 7.5X6 YLW CONV (MISCELLANEOUS) ×4 IMPLANT
SPECIMEN JAR X LARGE (MISCELLANEOUS) ×2 IMPLANT
SUT ETHILON 3 0 FSL (SUTURE) ×2 IMPLANT
SUT MON AB 4-0 PC3 18 (SUTURE) ×2 IMPLANT
SUT SILK 2 0 SH (SUTURE) ×2 IMPLANT
SUT VIC AB 3-0 SH 27 (SUTURE) ×1
SUT VIC AB 3-0 SH 27XBRD (SUTURE) ×1 IMPLANT
TOWEL OR 17X26 10 PK STRL BLUE (TOWEL DISPOSABLE) ×2 IMPLANT
WATER STERILE IRR 1000ML POUR (IV SOLUTION) IMPLANT

## 2017-09-08 NOTE — Op Note (Signed)
LEFT MASTECTOMY WITH SENTINEL LYMPH NODE BIOPSY  Procedure Note  [name] 09/08/2017   Pre-op Diagnosis: LEFT BREAST DCIS      Post-op Diagnosis: same  Procedure(s): LEFT TOTAL MASTECTOMY WITH DEEP LEFT AXILLARY SENTINEL LYMPH NODE BIOPSY  Surgeon(s): [name], MD  Anesthesia: General  Staff:  Circulator: Deland Pretty, RN Scrub Person: Adella Hare  Estimated Blood Loss: Minimal               Specimens: sent to path  Indications: This is a 61 year old female with a large left breast mass.  She has had a stereotactic biopsy showing high-grade DCIS.  It is ER and PR positive.  It is too large to be excised with a lumpectomy so she is elected to undergo a total mastectomy with sentinel lymph node biopsy  Findings: There is a large mass in the left breast which was excised with the mastectomy.  Multiple matted lymph nodes in the axilla were excised with the aid of the neoprobe  Procedure: The patient was brought to the operating room and identified as the correct patient.  She was placed supine on the operating table and general anesthesia was induced.  Her left breast was prepped and draped in the usual sterile fashion.  She already had radioactive isotope injected by the radiation technologist in the holding area prior to coming back to the operating room.  I made an elliptical incision on the left breast going from medial to lateral incorporating the nipple areolar complex.  I then took this down into the breast tissue with electrocautery.  I then created inferior and superior skin flaps with the aid of the electrocautery.  The hard mass could be felt taking up most of the central breast.  Once the flaps were created I then excised the breast tissue and overlying skin off of the chest wall at the pectoralis fascia with the electrocautery again dissecting medial to lateral.  I then completed the total mastectomy excising the rest of the breast tissue in total.  I then  marked the lateral breast with a silk suture.  It was sent to pathology for evaluation.  With the aid of the neoprobe I then began dissecting down into the deep left axilla.  Identified a group of matted lymph nodes with uptake of radioactive isotope.  I excised these with both electrocautery and surgical clips.  I then found another deep lymph node in the axilla with the neoprobe and excised this as well.  The lymph nodes were sent to pathology for evaluation.  I then irrigated the chest wall and axilla with saline.  Hemostasis appeared to be achieved.  I made a separate skin incision with a scalpel and placed a 19 [place] Blake drain into the axilla and along the mastectomy flaps.  This was sewn in place with a nylon suture.  Again, hemostasis appeared to be achieved.  I then closed the subcutaneous tissue with interrupted 3-0 Vicryl sutures.  I closed the skin with a running 4-0 Monocryl.  Dermabond was then applied.  Gauze and a breast binder were then placed.  The patient tolerated the procedure well.  All the counts were correct at the end of the procedure.  The patient was then extubated in the operating room and taken in a stable condition to the recovery room.          [name] A   Date: 09/08/2017  Time: 10:25 AM

## 2017-09-08 NOTE — Progress Notes (Signed)
Patient arrived to 7H21 A&Ox2.  Patient noted to have incision in left breast area with skin glue closure and breast binder in place.  Patient states pain 8/10.  IV intact.  B/P noted to be a little high.  Family present in waiting area to see patient.  Will continue to monitor.

## 2017-09-08 NOTE — Anesthesia Procedure Notes (Signed)
Anesthesia Regional Block: Pectoralis block   Pre-Anesthetic Checklist: ,, timeout performed, Correct Patient, Correct Site, Correct Laterality, Correct Procedure, Correct Position, site marked, Risks and benefits discussed,  Surgical consent,  Pre-op evaluation,  At surgeon's request and post-op pain management  Laterality: Left  Prep: chloraprep       Needles:   Needle Type: Echogenic Needle     Needle Length: 9cm  Needle Gauge: 21     Additional Needles:   Procedures:,,,, ultrasound used (permanent image in chart),,,,  Narrative:  Start time: 09/08/2017 8:46 AM End time: 09/08/2017 8:59 AM Injection made incrementally with aspirations every 5 mL.  Performed by: Personally  Anesthesiologist: Glennon Mac, Deidrick Rainey  Additional Notes: Pt identified in Holding room.  Monitors applied. Working IV access confirmed. Sterile prep, drape L chest.  #21ga PNS sub-pec minor, between ribs 4,5 with US guidance.  25cc 0.5% Bupivacaine with 1:200k epi injected incrementally after negative test dose.  Patient asymptomatic, VSS, no heme aspirated, tolerated well.

## 2017-09-08 NOTE — Anesthesia Postprocedure Evaluation (Signed)
Anesthesia Post Note  Patient: [name]  Procedure(s) Performed: LEFT MASTECTOMY WITH SENTINEL LYMPH NODE BIOPSY (Left Breast)     Patient location during evaluation: PACU Anesthesia Type: General and Regional Level of consciousness: awake and alert, patient cooperative and oriented Pain management: pain level controlled Vital Signs Assessment: post-procedure vital signs reviewed and stable Respiratory status: spontaneous breathing, nonlabored ventilation and respiratory function stable Cardiovascular status: blood pressure returned to baseline and stable Postop Assessment: no apparent nausea or vomiting Anesthetic complications: no    Last Vitals:  Vitals:   09/08/17 1132 09/08/17 1144  BP:  (!) 171/69  Pulse:  (!) 49  Resp:  16  Temp: (!) 36.4 C (!) 36.3 C  SpO2:  100%    Last Pain:  Vitals:   09/08/17 1144  TempSrc: Oral  PainSc: 8                  Anai Lipson,E. Marlei Glomski

## 2017-09-08 NOTE — Anesthesia Preprocedure Evaluation (Addendum)
Anesthesia Evaluation  Patient identified by MRN, date of birth, ID band Patient awake    Reviewed: Allergy & Precautions, NPO status , Patient's Chart, lab work & pertinent test results, reviewed documented beta blocker date and time   History of Anesthesia Complications Negative for: history of anesthetic complications  Airway Mallampati: I  TM Distance: >3 FB Neck ROM: Full    Dental  (+) Poor Dentition, Missing, Chipped, Dental Advisory Given   Pulmonary COPD,  COPD inhaler, Current Smoker,    breath sounds clear to auscultation       Cardiovascular hypertension, Pt. on home beta blockers and Pt. on medications (-) angina+ CAD, + Past MI and + Cardiac Stents   Rhythm:Regular Rate:Normal  '08 ECHO: EF 60%, valves OK   Neuro/Psych Anxiety CVA (R weakness, memory issues), Residual Symptoms    GI/Hepatic negative GI ROS, (+) Hepatitis -, C  Endo/Other  negative endocrine ROS  Renal/GU negative Renal ROS     Musculoskeletal  (+) Arthritis ,   Abdominal   Peds  Hematology plavix   Anesthesia Other Findings Breast cancer  Reproductive/Obstetrics                            Anesthesia Physical Anesthesia Plan  ASA: III  Anesthesia Plan: General   Post-op Pain Management: GA combined w/ Regional for post-op pain   Induction: Intravenous  PONV Risk Score and Plan: 2 and Ondansetron and Dexamethasone  Airway Management Planned: LMA  Additional Equipment:   Intra-op Plan:   Post-operative Plan:   Informed Consent: I have reviewed the patients History and Physical, chart, labs and discussed the procedure including the risks, benefits and alternatives for the proposed anesthesia with the patient or authorized representative who has indicated his/her understanding and acceptance.   Dental advisory given  Plan Discussed with: CRNA and Surgeon  Anesthesia Plan Comments: (Plan routine  monitors, GA- LMA OK, pectoralis block for post op analgesia)        Anesthesia Quick Evaluation

## 2017-09-08 NOTE — Interval H&P Note (Signed)
History and Physical Interval Note: no change in H and  P  09/08/2017 9:02 AM  [name]  has presented today for surgery, with the diagnosis of LEFT BREAST CANCER  The various methods of treatment have been discussed with the patient and family. After consideration of risks, benefits and other options for treatment, the patient has consented to  Procedure(s): LEFT MASTECTOMY WITH SENTINEL LYMPH NODE BIOPSY (Left) as a surgical intervention .  The patient's history has been reviewed, patient examined, no change in status, stable for surgery.  I have reviewed the patient's chart and labs.  Questions were answered to the patient's satisfaction.     Arminda Foglio A

## 2017-09-08 NOTE — Transfer of Care (Signed)
Immediate Anesthesia Transfer of Care Note  Patient: [name]  Procedure(s) Performed: LEFT MASTECTOMY WITH SENTINEL LYMPH NODE BIOPSY (Left Breast)  Patient Location: PACU  Anesthesia Type:General  Level of Consciousness: drowsy  Airway & Oxygen Therapy: Patient Spontanous Breathing and Patient connected to face mask oxygen  Post-op Assessment: Report given to RN and Post -op Vital signs reviewed and stable  Post vital signs: Reviewed and stable  Last Vitals:  Vitals:   09/08/17 0920 09/08/17 1032  BP: (!) 160/78   Pulse: (!) 55   Resp: 12   Temp:  (P) 36.5 C  SpO2: 100%     Last Pain:  Vitals:   09/08/17 0724  TempSrc: Oral         Complications: No apparent anesthesia complications

## 2017-09-08 NOTE — Anesthesia Procedure Notes (Signed)
Procedure Name: LMA Insertion Date/Time: 09/08/2017 9:36 AM Performed by: Shirlyn Goltz Pre-anesthesia Checklist: Patient identified, Emergency Drugs available, Suction available and Patient being monitored Patient Re-evaluated:Patient Re-evaluated prior to induction Oxygen Delivery Method: Circle system utilized Preoxygenation: Pre-oxygenation with 100% oxygen Induction Type: IV induction Ventilation: Mask ventilation without difficulty LMA: LMA inserted LMA Size: 4.0 Number of attempts: 1 Placement Confirmation: positive ETCO2 and breath sounds checked- equal and bilateral Dental Injury: Teeth and Oropharynx as per pre-operative assessment

## 2017-09-09 ENCOUNTER — Encounter (HOSPITAL_COMMUNITY): Payer: Self-pay | Admitting: Surgery

## 2017-09-09 DIAGNOSIS — C50912 Malignant neoplasm of unspecified site of left female breast: Secondary | ICD-10-CM | POA: Diagnosis not present

## 2017-09-09 MED ORDER — PHENOL 1.4 % MT LIQD
2.0000 | OROMUCOSAL | Status: DC | PRN
  Administered 2017-09-09: 2 via OROMUCOSAL
  Filled 2017-09-09: qty 177

## 2017-09-09 MED ORDER — OXYCODONE HCL 5 MG PO TABS: 5.0000 mg | ORAL_TABLET | Freq: Four times a day (QID) | ORAL | 0 refills | Status: DC | PRN

## 2017-09-09 MED ORDER — MENTHOL 3 MG MT LOZG
1.0000 | LOZENGE | OROMUCOSAL | Status: DC | PRN
  Filled 2017-09-09: qty 9

## 2017-09-09 NOTE — Progress Notes (Signed)
Discharged home.  Discharge instructions and education given to patient

## 2017-09-09 NOTE — Progress Notes (Signed)
Patient ID: [name], female   DOB: 12/06/1955, 61 y.o.   MRN: [id number]   Did well over night Comfortable Flaps viable Drain serosang  Plan: D/c home

## 2017-09-09 NOTE — Discharge Summary (Signed)
Physician Discharge Summary  Patient ID: [name] MRN: [id number] DOB/AGE: 1956/09/29 61 y.o.  Admit date: 09/08/2017 Discharge date: 09/09/2017  Admission Diagnoses:  Discharge Diagnoses:  Active Problems:   Ductal carcinoma in situ (DCIS) of left breast   Discharged Condition: good  Hospital Course: uneventful post op recovery.  Discharge home POD#1  Consults: None  Significant Diagnostic Studies:   Treatments: surgery: left total mastectomy and sentinel node biopsy  Discharge Exam: Blood pressure (!) 148/74, pulse 63, temperature 98.1 F (36.7 C), resp. rate 18, height 5\' 4"  (1.626 m), weight 56.2 kg (124 lb), SpO2 99 %. General appearance: alert, cooperative and no distress Resp: clear to auscultation bilaterally Cardio: regular rate and rhythm, S1, S2 normal, no murmur, click, rub or gallop Incision/Wound:left chest flaps viable, drain serosang, incision clean  Disposition: 01-Home or Self Care  Discharge Instructions    Discharge patient    Complete by:  As directed    Teach drain care and recording output   Discharge disposition:  01-Home or Self Care   Discharge patient date:  09/09/2017     Allergies as of 09/09/2017      Reactions   Bee Venom Anaphylaxis   Other    Patient states she has been told not to take ibuprofen and tylenol      Medication List    TAKE these medications   albuterol 108 (90 Base) MCG/ACT inhaler Commonly known as:  PROVENTIL HFA;VENTOLIN HFA Inhale 2 puffs into the lungs every 6 (six) hours as needed for wheezing. For wheezing   amLODipine 5 MG tablet Commonly known as:  NORVASC Take 10 mg by mouth daily.   aspirin 81 MG EC tablet Take 81 mg by mouth daily.   atorvastatin 40 MG tablet Commonly known as:  LIPITOR Take 40 mg by mouth every morning.   carvedilol 12.5 MG tablet Commonly known as:  COREG Take 12.5 mg by mouth 2 (two) times daily with a meal.   clopidogrel 75 MG tablet Commonly known as:   PLAVIX Take 75 mg by mouth daily.   nitroGLYCERIN 0.4 MG SL tablet Commonly known as:  NITROSTAT Place 0.4 mg under the tongue every 5 (five) minutes as needed for chest pain.   oxyCODONE 5 MG immediate release tablet Commonly known as:  Oxy IR/ROXICODONE Take 1-2 tablets (5-10 mg total) by mouth every 6 (six) hours as needed for moderate pain or severe pain.   potassium chloride SA 20 MEQ tablet Commonly known as:  K-DUR,KLOR-CON Take 1 tablet (20 mEq total) by mouth 2 (two) times daily.      Follow-up Information    [name], MD. Schedule an appointment as soon as possible for a visit in 2 week(s).   Specialty:  General Surgery Why:  nurses only, drain removal Contact information: [place] [address] [phone]           Signed: Harl Bowie 09/09/2017, 8:02 AM

## 2017-09-09 NOTE — Discharge Instructions (Signed)
[facility] [place] °[phone] ° °MASTECTOMY: POST OP INSTRUCTIONS ° °Always review your discharge instruction sheet given to you by the facility where your surgery was performed. °IF YOU HAVE DISABILITY OR FAMILY LEAVE FORMS, YOU MUST BRING THEM TO THE OFFICE FOR PROCESSING.   °DO NOT GIVE THEM TO YOUR DOCTOR. °A prescription for pain medication may be given to you upon discharge.  Take your pain medication as prescribed, if needed.  If narcotic pain medicine is not needed, then you may take acetaminophen (Tylenol) or ibuprofen (Advil) as needed. °1. Take your usually prescribed medications unless otherwise directed. °2. If you need a refill on your pain medication, please contact your pharmacy.  They will contact our office to request authorization.  Prescriptions will not be filled after 5pm or on week-ends. °3. You should follow a light diet the first few days after arrival home, such as soup and crackers, etc.  Resume your normal diet the day after surgery. °4. Most patients will experience some swelling and bruising on the chest and underarm.  Ice packs will help.  Swelling and bruising can take several days to resolve.  °5. It is common to experience some constipation if taking pain medication after surgery.  Increasing fluid intake and taking a stool softener (such as Colace) will usually help or prevent this problem from occurring.  A mild laxative (Milk of Magnesia or Miralax) should be taken according to package instructions if there are no bowel movements after 48 hours. °6. Unless discharge instructions indicate otherwise, leave your bandage dry and in place until your next appointment in 3-5 days.  You may take a limited sponge bath.  No tube baths or showers until the drains are removed.  You may have steri-strips (small skin tapes) in place directly over the incision.  These strips should be left on the skin for 7-10 days.  If your surgeon used skin glue on the incision, you may  shower in 24 hours.  The glue will flake off over the next 2-3 weeks.  Any sutures or staples will be removed at the office during your follow-up visit. °7. DRAINS:  If you have drains in place, it is important to keep a list of the amount of drainage produced each day in your drains.  Before leaving the hospital, you should be instructed on drain care.  Call our office if you have any questions about your drains. °8. ACTIVITIES:  You may resume regular (light) daily activities beginning the next day--such as daily self-care, walking, climbing stairs--gradually increasing activities as tolerated.  You may have sexual intercourse when it is comfortable.  Refrain from any heavy lifting or straining until approved by your doctor. °a. You may drive when you are no longer taking prescription pain medication, you can comfortably wear a seatbelt, and you can safely maneuver your car and apply brakes. °b. RETURN TO WORK:  __________________________________________________________ °9. You should see your doctor in the office for a follow-up appointment approximately 3-5 days after your surgery.  Your doctor’s nurse will typically make your follow-up appointment when she calls you with your pathology report.  Expect your pathology report 2-3 business days after your surgery.  You may call to check if you do not hear from us after three days.   °10. OTHER INSTRUCTIONS: ______________________________________________________________________________________________ ____________________________________________________________________________________________ °WHEN TO CALL YOUR DOCTOR: °1. Fever over 101.0 °2. Nausea and/or vomiting °3. Extreme swelling or bruising °4. Continued bleeding from incision. °5. Increased pain, redness, or drainage from the incision. °  The clinic staff is available to answer your questions during regular business hours.  Please don’t hesitate to call and ask to speak to one of the nurses for clinical  concerns.  If you have a medical emergency, go to the nearest emergency room or call 911.  A surgeon from [facility] is always on call at the hospital. °[address], [address], [place], [place]  [address] ? [address] [address], [place], [place]   [address] °[phone] ? [phone] ? FAX [phone] °Web site: [email or web address] °

## 2017-09-17 ENCOUNTER — Encounter (HOSPITAL_COMMUNITY): Payer: Self-pay | Admitting: *Deleted

## 2017-09-17 ENCOUNTER — Other Ambulatory Visit: Payer: Self-pay | Admitting: General Surgery

## 2017-09-17 NOTE — H&P (Signed)
[name] is an 61 y.o. female.   Chief Complaint: Postoperative left breast hematoma HPI: She is status post a left breast total mastectomy and sentinel node biopsy.  She presented to the office today with a large postoperative hematoma.  Her drain is clogged with clot and not draining.  She is otherwise without complaints.  Past Medical History:  Diagnosis Date  . Adjustment disorder with depressed mood 01/11/2009  . Arthritis   . Asthma   . Breast cancer ([place])    left breast  . CAD 07/10/2008  . Chronic back pain   . COCAINE DEPENDENCY NOS 01/14/2007   stopped Cocaine 15 years   . CONVULSIONS, SEIZURES, NOS 01/14/2007   last seizure May, 2016  . COPD (chronic obstructive pulmonary disease) ([place])   . Coronary artery disease 01/14/2012  . CVA 01/14/2007  . Difficulty seeing    patient has glaucoma  . Family history of anesthesia complication 9622   Pts sister (57 years old) died after having anesthesia - induced a heart attcks  . Glaucoma of both eyes   . Headache(784.0)   . HEPATITIS C 02/26/2009  . Hepatitis C   . Hyperlipemia   . HYPERTENSION, BENIGN SYSTEMIC 01/14/2007  . Memory loss    d/t CVA  . MI (myocardial infarction) ([place]) 12/20/2007   had stent placed  . Pneumonia    hx of  . PTSD (post-traumatic stress disorder)   . PTSD (post-traumatic stress disorder)   . Right sided weakness    d/t stroke  . Sciatica   . Stroke [facility])    right side weakness  . TOBACCO DEPENDENCE 01/14/2007    Past Surgical History:  Procedure Laterality Date  . CATARACT EXTRACTION W/PHACO Right 07/11/2015   Procedure: CATARACT EXTRACTION PHACO AND INTRAOCULAR LENS PLACEMENT ([place]) RIGHT,INTERNAL PLEB REVISION;  Surgeon: Marylynn Pearson, MD;  Location: [facility];  Service: Ophthalmology;  Laterality: Right;  . CESAREAN SECTION     x 2  . COLONOSCOPY W/ POLYPECTOMY    . CORONARY ANGIOPLASTY WITH STENT PLACEMENT  2009   1 stent  . EYE SURGERY    . MASTECTOMY W/ SENTINEL NODE BIOPSY Left 09/08/2017    Procedure: LEFT MASTECTOMY WITH SENTINEL LYMPH NODE BIOPSY;  Surgeon: [name], MD;  Location: [facility];  Service: General;  Laterality: Left;  . MITOMYCIN C APPLICATION Right 29/79/8921   Procedure: MITOMYCIN C APPLICATION RIGHT EYE;  Surgeon: Marylynn Pearson, MD;  Location: [facility];  Service: Ophthalmology;  Laterality: Right;  . TONSILLECTOMY    . TRABECULECTOMY Left 06/29/2013   Procedure: TRABECULECTOMY WITH MYTOMICIN C LEFT EYE;  Surgeon: Marylynn Pearson, MD;  Location: [facility];  Service: Ophthalmology;  Laterality: Left;  . TRABECULECTOMY Right 09/07/2013   Procedure: TRABECULECTOMY RIGHT EYE;  Surgeon: Marylynn Pearson, MD;  Location: [facility];  Service: Ophthalmology;  Laterality: Right;    Family History  Problem Relation Age of Onset  . Stroke Mother   . Colon cancer Paternal Uncle   . Heart disease Sister   . Colon cancer Paternal Aunt    Social History:  reports that she has been smoking Cigarettes.  She has a 11.75 pack-year smoking history. She has never used smokeless tobacco. She reports that she does not drink alcohol or use drugs.  Allergies:  Allergies  Allergen Reactions  . Bee Venom Anaphylaxis  . Other     Patient states she has been told not to take ibuprofen and tylenol    No prescriptions prior to  admission.    No results found for this or any previous visit (from the past 48 hour(s)). No results found.  Review of Systems  All other systems reviewed and are negative.   There were no vitals taken for this visit. Physical Exam  Constitutional: She is oriented to person, place, and time. She appears well-developed and well-nourished. No distress.  HENT:  Head: Normocephalic and atraumatic.  Right Ear: External ear normal.  Left Ear: External ear normal.  Eyes: Pupils are equal, round, and reactive to light.  Neck: Normal range of motion. No tracheal deviation present.  Cardiovascular: Normal rate, regular rhythm and normal heart sounds.   Respiratory:  Effort normal and breath sounds normal. No respiratory distress.  GI: Soft. There is no tenderness.  Musculoskeletal: Normal range of motion.  Neurological: She is alert and oriented to person, place, and time.  Skin: Skin is warm and dry.  There is a large hematoma under the left breast mastectomy site  Psychiatric: Her behavior is normal.     Assessment/Plan Postoperative left breast hematoma  The plan will be to proceed to the operating room for evacuation of the hematoma.  I discussed this with the patient and the family in detail.  We discussed the risk of the procedure.  She agrees to proceed with surgery  Harl Bowie, MD 09/17/2017, 4:19 PM

## 2017-09-18 ENCOUNTER — Encounter (HOSPITAL_COMMUNITY): Payer: Self-pay | Admitting: Surgery

## 2017-09-18 ENCOUNTER — Ambulatory Visit (HOSPITAL_COMMUNITY): Payer: Medicaid Other | Admitting: Critical Care Medicine

## 2017-09-18 ENCOUNTER — Encounter (HOSPITAL_COMMUNITY)
Admission: RE | Disposition: A | Discharge status: Home / Self Care | Payer: Self-pay | Source: Ambulatory Visit | Attending: Surgery

## 2017-09-18 ENCOUNTER — Ambulatory Visit (HOSPITAL_COMMUNITY)
Admission: RE | Admit: 2017-09-18 | Discharge: 2017-09-18 | Disposition: A | Discharge status: Home / Self Care | Payer: Medicaid Other | Source: Ambulatory Visit | Attending: Surgery | Admitting: Surgery

## 2017-09-18 DIAGNOSIS — Z79899 Other long term (current) drug therapy: Secondary | ICD-10-CM | POA: Insufficient documentation

## 2017-09-18 DIAGNOSIS — Z7902 Long term (current) use of antithrombotics/antiplatelets: Secondary | ICD-10-CM | POA: Insufficient documentation

## 2017-09-18 DIAGNOSIS — B1920 Unspecified viral hepatitis C without hepatic coma: Secondary | ICD-10-CM | POA: Diagnosis not present

## 2017-09-18 DIAGNOSIS — Z79891 Long term (current) use of opiate analgesic: Secondary | ICD-10-CM | POA: Diagnosis not present

## 2017-09-18 DIAGNOSIS — Z8673 Personal history of transient ischemic attack (TIA), and cerebral infarction without residual deficits: Secondary | ICD-10-CM | POA: Insufficient documentation

## 2017-09-18 DIAGNOSIS — I252 Old myocardial infarction: Secondary | ICD-10-CM | POA: Diagnosis not present

## 2017-09-18 DIAGNOSIS — J449 Chronic obstructive pulmonary disease, unspecified: Secondary | ICD-10-CM | POA: Diagnosis not present

## 2017-09-18 DIAGNOSIS — Z961 Presence of intraocular lens: Secondary | ICD-10-CM | POA: Insufficient documentation

## 2017-09-18 DIAGNOSIS — Z9841 Cataract extraction status, right eye: Secondary | ICD-10-CM | POA: Diagnosis not present

## 2017-09-18 DIAGNOSIS — C50912 Malignant neoplasm of unspecified site of left female breast: Secondary | ICD-10-CM | POA: Diagnosis not present

## 2017-09-18 DIAGNOSIS — F17210 Nicotine dependence, cigarettes, uncomplicated: Secondary | ICD-10-CM | POA: Diagnosis not present

## 2017-09-18 DIAGNOSIS — Z9012 Acquired absence of left breast and nipple: Secondary | ICD-10-CM | POA: Diagnosis not present

## 2017-09-18 DIAGNOSIS — Z7982 Long term (current) use of aspirin: Secondary | ICD-10-CM | POA: Diagnosis not present

## 2017-09-18 DIAGNOSIS — I10 Essential (primary) hypertension: Secondary | ICD-10-CM | POA: Diagnosis not present

## 2017-09-18 DIAGNOSIS — E785 Hyperlipidemia, unspecified: Secondary | ICD-10-CM | POA: Diagnosis not present

## 2017-09-18 DIAGNOSIS — I2510 Atherosclerotic heart disease of native coronary artery without angina pectoris: Secondary | ICD-10-CM | POA: Diagnosis not present

## 2017-09-18 DIAGNOSIS — Y836 Removal of other organ (partial) (total) as the cause of abnormal reaction of the patient, or of later complication, without mention of misadventure at the time of the procedure: Secondary | ICD-10-CM | POA: Diagnosis not present

## 2017-09-18 DIAGNOSIS — Z823 Family history of stroke: Secondary | ICD-10-CM | POA: Insufficient documentation

## 2017-09-18 DIAGNOSIS — Z955 Presence of coronary angioplasty implant and graft: Secondary | ICD-10-CM | POA: Insufficient documentation

## 2017-09-18 DIAGNOSIS — Z86010 Personal history of colonic polyps: Secondary | ICD-10-CM | POA: Diagnosis not present

## 2017-09-18 DIAGNOSIS — M549 Dorsalgia, unspecified: Secondary | ICD-10-CM | POA: Diagnosis not present

## 2017-09-18 DIAGNOSIS — Z8669 Personal history of other diseases of the nervous system and sense organs: Secondary | ICD-10-CM | POA: Insufficient documentation

## 2017-09-18 DIAGNOSIS — Z8249 Family history of ischemic heart disease and other diseases of the circulatory system: Secondary | ICD-10-CM | POA: Insufficient documentation

## 2017-09-18 DIAGNOSIS — Z9889 Other specified postprocedural states: Secondary | ICD-10-CM | POA: Diagnosis not present

## 2017-09-18 DIAGNOSIS — H409 Unspecified glaucoma: Secondary | ICD-10-CM | POA: Diagnosis not present

## 2017-09-18 DIAGNOSIS — L7632 Postprocedural hematoma of skin and subcutaneous tissue following other procedure: Secondary | ICD-10-CM | POA: Insufficient documentation

## 2017-09-18 DIAGNOSIS — Z87892 Personal history of anaphylaxis: Secondary | ICD-10-CM | POA: Insufficient documentation

## 2017-09-18 DIAGNOSIS — G8929 Other chronic pain: Secondary | ICD-10-CM | POA: Insufficient documentation

## 2017-09-18 DIAGNOSIS — N6489 Other specified disorders of breast: Secondary | ICD-10-CM | POA: Diagnosis present

## 2017-09-18 DIAGNOSIS — Z91030 Bee allergy status: Secondary | ICD-10-CM | POA: Insufficient documentation

## 2017-09-18 DIAGNOSIS — Z800 Family history of malignant neoplasm of digestive organs: Secondary | ICD-10-CM | POA: Insufficient documentation

## 2017-09-18 HISTORY — PX: BREAST LUMPECTOMY: SHX2

## 2017-09-18 LAB — CBC
HEMATOCRIT: 28.9 % — AB (ref 36.0–46.0)
HEMOGLOBIN: 9.8 g/dL — AB (ref 12.0–15.0)
MCH: 29.5 pg (ref 26.0–34.0)
MCHC: 33.9 g/dL (ref 30.0–36.0)
MCV: 87 fL (ref 78.0–100.0)
PLATELETS: 360 10*3/uL (ref 150–400)
RBC: 3.32 MIL/uL — ABNORMAL LOW (ref 3.87–5.11)
RDW: 14.5 % (ref 11.5–15.5)
WBC: 10.7 10*3/uL — ABNORMAL HIGH (ref 4.0–10.5)

## 2017-09-18 LAB — BASIC METABOLIC PANEL
ANION GAP: 10 (ref 5–15)
BUN: 10 mg/dL (ref 6–20)
CHLORIDE: 105 mmol/L (ref 101–111)
CO2: 21 mmol/L — AB (ref 22–32)
Calcium: 9.3 mg/dL (ref 8.9–10.3)
Creatinine, Ser: 0.77 mg/dL (ref 0.44–1.00)
GFR calc Af Amer: >60 mL/min (ref >60)
GFR calc non Af Amer: >60 mL/min (ref >60)
GLUCOSE: 94 mg/dL (ref 65–99)
POTASSIUM: 5.8 mmol/L — AB (ref 3.5–5.1)
Sodium: 136 mmol/L (ref 135–145)

## 2017-09-18 SURGERY — BREAST LUMPECTOMY
Anesthesia: General | Site: Breast | Laterality: Left

## 2017-09-18 MED ORDER — CEFAZOLIN SODIUM-DEXTROSE 2-4 GM/100ML-% IV SOLN
2.0000 g | INTRAVENOUS | Status: AC
  Administered 2017-09-18: 2 g via INTRAVENOUS
  Filled 2017-09-18: qty 100

## 2017-09-18 MED ORDER — FENTANYL CITRATE (PF) 100 MCG/2ML IJ SOLN: 25.0000 ug | INTRAMUSCULAR | Status: DC | PRN

## 2017-09-18 MED ORDER — MIDAZOLAM HCL 2 MG/2ML IJ SOLN
INTRAMUSCULAR | Status: AC
  Filled 2017-09-18: qty 2

## 2017-09-18 MED ORDER — ONDANSETRON HCL 4 MG/2ML IJ SOLN
INTRAMUSCULAR | Status: DC | PRN
  Administered 2017-09-18: 4 mg via INTRAVENOUS

## 2017-09-18 MED ORDER — LIDOCAINE 2% (20 MG/ML) 5 ML SYRINGE
INTRAMUSCULAR | Status: DC | PRN
  Administered 2017-09-18: 100 mg via INTRAVENOUS

## 2017-09-18 MED ORDER — PROPOFOL 10 MG/ML IV BOLUS
INTRAVENOUS | Status: AC
  Filled 2017-09-18: qty 20

## 2017-09-18 MED ORDER — BUPIVACAINE-EPINEPHRINE 0.25% -1:200000 IJ SOLN
INTRAMUSCULAR | Status: DC | PRN
  Administered 2017-09-18: 3 mL

## 2017-09-18 MED ORDER — FENTANYL CITRATE (PF) 250 MCG/5ML IJ SOLN
INTRAMUSCULAR | Status: DC | PRN
  Administered 2017-09-18: 50 ug via INTRAVENOUS

## 2017-09-18 MED ORDER — DEXAMETHASONE SODIUM PHOSPHATE 10 MG/ML IJ SOLN
INTRAMUSCULAR | Status: DC | PRN
  Administered 2017-09-18: 10 mg via INTRAVENOUS

## 2017-09-18 MED ORDER — MORPHINE SULFATE (PF) 2 MG/ML IV SOLN: 1.0000 mg | INTRAVENOUS | Status: DC | PRN

## 2017-09-18 MED ORDER — ACETAMINOPHEN 650 MG RE SUPP: 650.0000 mg | RECTAL | Status: DC | PRN

## 2017-09-18 MED ORDER — SODIUM CHLORIDE 0.9 % IV SOLN
250.0000 mL | INTRAVENOUS | Status: DC | PRN
Start: 2017-09-18 — End: 2017-09-18

## 2017-09-18 MED ORDER — MIDAZOLAM HCL 5 MG/5ML IJ SOLN
INTRAMUSCULAR | Status: DC | PRN
  Administered 2017-09-18: 2 mg via INTRAVENOUS

## 2017-09-18 MED ORDER — 0.9 % SODIUM CHLORIDE (POUR BTL) OPTIME
TOPICAL | Status: DC | PRN
  Administered 2017-09-18: 1000 mL

## 2017-09-18 MED ORDER — KETOROLAC TROMETHAMINE 30 MG/ML IJ SOLN: 30.0000 mg | Freq: Once | INTRAMUSCULAR | Status: DC | PRN

## 2017-09-18 MED ORDER — ACETAMINOPHEN 500 MG PO TABS
1000.0000 mg | ORAL_TABLET | ORAL | Status: DC
  Filled 2017-09-18: qty 2

## 2017-09-18 MED ORDER — ACETAMINOPHEN 325 MG PO TABS: 325.0000 mg | ORAL_TABLET | ORAL | Status: DC | PRN

## 2017-09-18 MED ORDER — PROMETHAZINE HCL 25 MG/ML IJ SOLN: 6.2500 mg | INTRAMUSCULAR | Status: DC | PRN

## 2017-09-18 MED ORDER — SODIUM CHLORIDE 0.9% FLUSH: 3.0000 mL | Freq: Two times a day (BID) | INTRAVENOUS | Status: DC

## 2017-09-18 MED ORDER — PROPOFOL 10 MG/ML IV BOLUS
INTRAVENOUS | Status: DC | PRN
  Administered 2017-09-18: 150 mg via INTRAVENOUS

## 2017-09-18 MED ORDER — OXYCODONE HCL 5 MG PO TABS: 5.0000 mg | ORAL_TABLET | Freq: Once | ORAL | Status: DC | PRN

## 2017-09-18 MED ORDER — ACETAMINOPHEN 325 MG PO TABS: 650.0000 mg | ORAL_TABLET | ORAL | Status: DC | PRN

## 2017-09-18 MED ORDER — MEPERIDINE HCL 25 MG/ML IJ SOLN: 6.2500 mg | INTRAMUSCULAR | Status: DC | PRN

## 2017-09-18 MED ORDER — FENTANYL CITRATE (PF) 250 MCG/5ML IJ SOLN
INTRAMUSCULAR | Status: AC
  Filled 2017-09-18: qty 5

## 2017-09-18 MED ORDER — GABAPENTIN 300 MG PO CAPS
300.0000 mg | ORAL_CAPSULE | ORAL | Status: AC
Start: 2017-09-18 — End: 2017-09-18
  Administered 2017-09-18: 300 mg via ORAL
  Filled 2017-09-18: qty 1

## 2017-09-18 MED ORDER — SODIUM CHLORIDE 0.9% FLUSH: 3.0000 mL | INTRAVENOUS | Status: DC | PRN

## 2017-09-18 MED ORDER — ACETAMINOPHEN 160 MG/5ML PO SOLN: 325.0000 mg | ORAL | Status: DC | PRN

## 2017-09-18 MED ORDER — LACTATED RINGERS IV SOLN
INTRAVENOUS | Status: DC
  Administered 2017-09-18: 09:00:00 via INTRAVENOUS

## 2017-09-18 MED ORDER — OXYCODONE HCL 5 MG/5ML PO SOLN: 5.0000 mg | Freq: Once | ORAL | Status: DC | PRN

## 2017-09-18 MED ORDER — OXYCODONE HCL 5 MG PO TABS: 5.0000 mg | ORAL_TABLET | ORAL | Status: DC | PRN

## 2017-09-18 SURGICAL SUPPLY — 38 items
BINDER BREAST LRG (GAUZE/BANDAGES/DRESSINGS) ×2 IMPLANT
BINDER BREAST XLRG (GAUZE/BANDAGES/DRESSINGS) IMPLANT
CANISTER SUCT 3000ML PPV (MISCELLANEOUS) IMPLANT
CHLORAPREP W/TINT 26ML (MISCELLANEOUS) IMPLANT
CONT SPEC 4OZ CLIKSEAL STRL BL (MISCELLANEOUS) IMPLANT
COVER SURGICAL LIGHT HANDLE (MISCELLANEOUS) ×2 IMPLANT
DECANTER SPIKE VIAL GLASS SM (MISCELLANEOUS) IMPLANT
DERMABOND ADVANCED (GAUZE/BANDAGES/DRESSINGS) ×1
DERMABOND ADVANCED .7 DNX12 (GAUZE/BANDAGES/DRESSINGS) ×1 IMPLANT
DEVICE DUBIN SPECIMEN MAMMOGRA (MISCELLANEOUS) IMPLANT
DRAPE LAPAROTOMY 100X72 PEDS (DRAPES) ×2 IMPLANT
DRAPE UTILITY XL STRL (DRAPES) ×2 IMPLANT
ELECT CAUTERY BLADE 6.4 (BLADE) ×2 IMPLANT
ELECT REM PT RETURN 9FT ADLT (ELECTROSURGICAL) ×2
ELECTRODE REM PT RTRN 9FT ADLT (ELECTROSURGICAL) ×1 IMPLANT
GAUZE SPONGE 4X4 16PLY XRAY LF (GAUZE/BANDAGES/DRESSINGS) ×2 IMPLANT
GLOVE SURG SIGNA 7.5 PF LTX (GLOVE) ×2 IMPLANT
GOWN STRL REUS W/ TWL LRG LVL3 (GOWN DISPOSABLE) ×1 IMPLANT
GOWN STRL REUS W/ TWL XL LVL3 (GOWN DISPOSABLE) ×1 IMPLANT
GOWN STRL REUS W/TWL LRG LVL3 (GOWN DISPOSABLE) ×1
GOWN STRL REUS W/TWL XL LVL3 (GOWN DISPOSABLE) ×1
KIT BASIN OR (CUSTOM PROCEDURE TRAY) ×2 IMPLANT
KIT MARKER MARGIN INK (KITS) IMPLANT
KIT ROOM TURNOVER OR (KITS) ×2 IMPLANT
NEEDLE HYPO 25GX1X1/2 BEV (NEEDLE) ×2 IMPLANT
NS IRRIG 1000ML POUR BTL (IV SOLUTION) ×2 IMPLANT
PACK SURGICAL SETUP 50X90 (CUSTOM PROCEDURE TRAY) ×2 IMPLANT
PAD ARMBOARD 7.5X6 YLW CONV (MISCELLANEOUS) ×2 IMPLANT
PENCIL BUTTON HOLSTER BLD 10FT (ELECTRODE) ×2 IMPLANT
SUT MNCRL AB 4-0 PS2 18 (SUTURE) ×2 IMPLANT
SUT VIC AB 3-0 SH 27 (SUTURE) ×1
SUT VIC AB 3-0 SH 27X BRD (SUTURE) ×1 IMPLANT
SYR BULB 3OZ (MISCELLANEOUS) ×2 IMPLANT
SYR CONTROL 10ML LL (SYRINGE) ×2 IMPLANT
TOWEL OR 17X24 6PK STRL BLUE (TOWEL DISPOSABLE) ×2 IMPLANT
TOWEL OR 17X26 10 PK STRL BLUE (TOWEL DISPOSABLE) ×2 IMPLANT
TUBE CONNECTING 12X1/4 (SUCTIONS) IMPLANT
YANKAUER SUCT BULB TIP NO VENT (SUCTIONS) ×2 IMPLANT

## 2017-09-18 NOTE — Transfer of Care (Signed)
Immediate Anesthesia Transfer of Care Note  Patient: [name]  Procedure(s) Performed: EVACUATION HEMATOMA  LEFT BREAST (Left Breast)  Patient Location: PACU  Anesthesia Type:General  Level of Consciousness: drowsy and patient cooperative  Airway & Oxygen Therapy: Patient Spontanous Breathing and Patient connected to nasal cannula oxygen  Post-op Assessment: Report given to RN and Post -op Vital signs reviewed and stable  Post vital signs: Reviewed and stable  Last Vitals:  Vitals:   09/18/17 0820  BP: 108/63  Pulse: (!) 55  Resp: 18  Temp: (!) 36.4 C  SpO2: 100%    Last Pain:  Vitals:   09/18/17 0840  TempSrc:   PainSc: 5       Patients Stated Pain Goal: 3 (14/48/18 5631)  Complications: No apparent anesthesia complications

## 2017-09-18 NOTE — Anesthesia Preprocedure Evaluation (Signed)
Anesthesia Evaluation  Patient identified by MRN, date of birth, ID band Patient awake    Reviewed: Allergy & Precautions, NPO status , Patient's Chart, lab work & pertinent test results, reviewed documented beta blocker date and time   History of Anesthesia Complications Negative for: history of anesthetic complications  Airway Mallampati: I  TM Distance: >3 FB Neck ROM: Full    Dental  (+) Poor Dentition, Missing, Chipped, Dental Advisory Given   Pulmonary COPD,  COPD inhaler, Current Smoker,    Pulmonary exam normal breath sounds clear to auscultation       Cardiovascular hypertension, Pt. on home beta blockers and Pt. on medications (-) angina+ CAD, + Past MI and + Cardiac Stents  Normal cardiovascular exam Rhythm:Regular Rate:Normal  '08 ECHO: EF 60%, valves OK   Neuro/Psych Anxiety Depression CVA (R weakness, memory issues), Residual Symptoms    GI/Hepatic negative GI ROS, (+) Hepatitis -, C  Endo/Other  negative endocrine ROS  Renal/GU negative Renal ROS     Musculoskeletal  (+) Arthritis ,   Abdominal Normal abdominal exam  (+)   Peds  Hematology  (+) anemia , plavix   Anesthesia Other Findings Breast cancer  Reproductive/Obstetrics                             Anesthesia Physical  Anesthesia Plan  ASA: III  Anesthesia Plan: General   Post-op Pain Management: GA combined w/ Regional for post-op pain   Induction: Intravenous  PONV Risk Score and Plan: 3 and Ondansetron, Dexamethasone and Midazolam  Airway Management Planned: LMA  Additional Equipment:   Intra-op Plan:   Post-operative Plan:   Informed Consent: I have reviewed the patients History and Physical, chart, labs and discussed the procedure including the risks, benefits and alternatives for the proposed anesthesia with the patient or authorized representative who has indicated his/her understanding and  acceptance.   Dental advisory given  Plan Discussed with: CRNA and Surgeon  Anesthesia Plan Comments: (Plan routine monitors, GA- LMA OK, )        Anesthesia Quick Evaluation

## 2017-09-18 NOTE — Interval H&P Note (Signed)
History and Physical Interval Note:no change in H and P  09/18/2017 8:33 AM  [name]  has presented today for surgery, with the diagnosis of LEFT BREAST HEMATOMA  The various methods of treatment have been discussed with the patient and family. After consideration of risks, benefits and other options for treatment, the patient has consented to  Procedure(s): EVACUATION HEMATOMA  LEFT BREAST (Left) as a surgical intervention .  The patient's history has been reviewed, patient examined, no change in status, stable for surgery.  I have reviewed the patient's chart and labs.  Questions were answered to the patient's satisfaction.     Arlester Keehan A

## 2017-09-18 NOTE — Anesthesia Postprocedure Evaluation (Signed)
Anesthesia Post Note  Patient: [name]  Procedure(s) Performed: EVACUATION HEMATOMA  LEFT BREAST (Left Breast)     Patient location during evaluation: PACU Anesthesia Type: General Level of consciousness: awake Pain management: pain level controlled Vital Signs Assessment: post-procedure vital signs reviewed and stable Respiratory status: spontaneous breathing Cardiovascular status: stable Postop Assessment: no apparent nausea or vomiting Anesthetic complications: no    Last Vitals:  Vitals:   09/18/17 1245 09/18/17 1250  BP: (!) 117/34 135/65  Pulse: (!) 56 (!) 56  Resp: 14 16  Temp:    SpO2: 97% 100%    Last Pain:  Vitals:   09/18/17 1250  TempSrc:   PainSc: 0-No pain   Pain Goal: Patients Stated Pain Goal: 3 (09/18/17 0840)               Myrla Malanowski JR,JOHN Mateo Flow

## 2017-09-18 NOTE — Op Note (Signed)
EVACUATION HEMATOMA  LEFT BREAST  Procedure Note  [name] 09/18/2017   Pre-op Diagnosis: LEFT BREAST HEMATOMA     Post-op Diagnosis: same  Procedure(s): EVACUATION HEMATOMA  LEFT BREAST  Surgeon(s): [name], MD  Anesthesia: General  Staff:  Circulator: Nicholos Johns, RN Scrub Person: Teschner, Mindy K, CST  Estimated Blood Loss: Minimal               Indications: This is a 61 year old female who is 10 days status post a left mastectomy.  She has developed a hematoma underneath the mastectomy flaps so the decision has been made to proceed to the operating room for evacuation of the hematoma  Procedure: The patient was brought to the operating room and identified as the correct patient.  She was placed upon the operating table and general anesthesia was induced.  I removed her previous Blake drain.  The left chest was then prepped and draped in the usual sterile fashion.  I opened a small area of the mastectomy incision with a scalpel.  I then easily evacuated a large hematoma.  We irrigated the wound with saline.  There appeared to be no active bleeding.  I then closed the subtenons tissue with interrupted 3-0 Vicryl sutures and closed the skin with a running 4-0 Monocryl.  More Dermabond was placed.  I elected not to leave a drain.  A binder was then tightly secured in place.  The patient tolerated the procedure well.  All the counts were correct at the end of the procedure.  The patient was then extubated in the operating room and taken in a stable condition to the recovery room.          [name] A   Date: 09/18/2017  Time: 10:56 AM

## 2017-09-18 NOTE — Discharge Instructions (Signed)
Keep binder in place and tight for the next 3 days

## 2017-09-18 NOTE — Anesthesia Procedure Notes (Signed)
Procedure Name: LMA Insertion Date/Time: 09/18/2017 10:28 AM Performed by: Merrilyn Puma B Pre-anesthesia Checklist: Patient identified, Emergency Drugs available, Suction available, Patient being monitored and Timeout performed Patient Re-evaluated:Patient Re-evaluated prior to induction Oxygen Delivery Method: Circle system utilized Preoxygenation: Pre-oxygenation with 100% oxygen Induction Type: IV induction Ventilation: Mask ventilation without difficulty LMA: LMA inserted LMA Size: 4.0 Placement Confirmation: positive ETCO2 and breath sounds checked- equal and bilateral Tube secured with: Tape Dental Injury: Teeth and Oropharynx as per pre-operative assessment

## 2017-09-19 ENCOUNTER — Encounter (HOSPITAL_COMMUNITY): Payer: Self-pay | Admitting: Surgery

## 2017-09-21 ENCOUNTER — Ambulatory Visit: Payer: Medicaid Other | Admitting: Hematology and Oncology

## 2017-09-22 ENCOUNTER — Ambulatory Visit: Payer: Medicaid Other | Admitting: Family Medicine

## 2017-09-23 ENCOUNTER — Encounter: Payer: Self-pay | Admitting: Adult Health

## 2017-09-25 ENCOUNTER — Telehealth: Payer: Self-pay | Admitting: Hematology and Oncology

## 2017-09-25 NOTE — Telephone Encounter (Signed)
Scheduled appt per 11/6 sch message - patient is aware of appt date and time.

## 2017-09-28 ENCOUNTER — Ambulatory Visit (HOSPITAL_BASED_OUTPATIENT_CLINIC_OR_DEPARTMENT_OTHER): Payer: Medicaid Other | Admitting: Hematology and Oncology

## 2017-09-28 ENCOUNTER — Telehealth: Payer: Self-pay | Admitting: Hematology and Oncology

## 2017-09-28 DIAGNOSIS — Z170 Estrogen receptor positive status [ER+]: Secondary | ICD-10-CM

## 2017-09-28 DIAGNOSIS — C773 Secondary and unspecified malignant neoplasm of axilla and upper limb lymph nodes: Secondary | ICD-10-CM | POA: Diagnosis not present

## 2017-09-28 DIAGNOSIS — D0512 Intraductal carcinoma in situ of left breast: Secondary | ICD-10-CM | POA: Diagnosis not present

## 2017-09-28 DIAGNOSIS — C50812 Malignant neoplasm of overlapping sites of left female breast: Secondary | ICD-10-CM

## 2017-09-28 MED ORDER — ANASTROZOLE 1 MG PO TABS: 1.0000 mg | ORAL_TABLET | Freq: Every day | ORAL | 3 refills | Status: DC

## 2017-09-28 NOTE — Telephone Encounter (Signed)
Gave patient avs and calendar with appts per 11/12 los.  °

## 2017-09-28 NOTE — Assessment & Plan Note (Signed)
08/13/2017: Mammogram and ultrasound:Large left breast mass involving all quadrants measuring 7.5 cm with enlarged axillary lymph node, biopsy revealed high-grade  DCIS ER 40% PR 20%,, lymph node biopsy negative, Tis N0 stage 0  09/08/2017: Left mastectomy: IDC grade 2, 0.6 cm, extensive DCIS, high-grade with necrosis, margins negative, 1/8 lymph node with micrometastatic disease, T1BN1 mic stage I a, ER 100%, PR 100%, Ki-67 70%, HER-2 negative ratio 1.23.  Recommendation: 1.Left mastectomy 2. Followed by antiestrogen therapy with anastrozole5 years Patient history of  Stroke and coronary artery disease and therefore she did not good candidate for tamoxifen.  We will present her case in the tumor board regarding the micrometastatic disease in the axilla.  Clinically we treat Micromedics as lymph node negative.  Given her health and comorbidities may not be a good candidate for adjuvant radiation.  I will send a prescription for antiestrogen therapy.  She will start taking the medication once she hears back from [place] after the tumor board discussion.  Return to clinic in 3 months for survivorship care visit and follow-up.

## 2017-09-28 NOTE — Progress Notes (Signed)
Patient Care Team: Sela Hilding, MD as PCP - General (Family Medicine)  DIAGNOSIS:  Encounter Diagnosis  Name Primary?  . Malignant neoplasm of overlapping sites of left breast in female, estrogen receptor positive ([place])     SUMMARY OF ONCOLOGIC HISTORY:   Ductal carcinoma in situ (DCIS) of left breast   08/13/2017 Initial Diagnosis    Large left breast mass involving all quadrants measuring 7.5 cm with enlarged axillary lymph node, biopsy revealed high-grade  DCIS ER 40% PR 20%,, lymph node biopsy negative, Tis N0 stage 0      09/08/2017 Surgery    Left mastectomy: IDC grade 2, 0.6 cm, extensive high-grade DCIS with necrosis, margins negative, 1/8 lymph nodes with micrometastases, T1bN1Mic (Stage 1A)       Malignant neoplasm of overlapping sites of left breast in female, estrogen receptor positive ([place])   08/19/2017 Initial Diagnosis    Malignant neoplasm of overlapping sites of left breast in female, estrogen receptor positive ([place])      09/08/2017 Surgery    Left mastectomy: IDC grade 2, 0.6 cm, extensive DCIS, high-grade with necrosis, margins negative, 1/8 lymph node with micrometastatic disease, T1BN1 mic stage I a, ER 100%, PR 100%, Ki-67 70%, HER-2 negative ratio 1.23.       CHIEF COMPLIANT: Follow-up after recent left mastectomy  INTERVAL HISTORY: [name] is a 61 year old with above-mentioned history of left breast DCIS who underwent mastectomy and is here today to discuss the pathology report.  She had a hematoma which needed to be evacuated.  So she is recovering from that as well.  She has a prior history of stroke and does not have great performance status.  Because of this our plan is not to give her chemo.  She describes the pain is manageable.  REVIEW OF SYSTEMS:   Constitutional: Denies fevers, chills or abnormal weight loss Eyes: Denies blurriness of vision Ears, nose, mouth, throat, and face: Denies mucositis or sore throat Respiratory: Denies  cough, dyspnea or wheezes Cardiovascular: Denies palpitation, chest discomfort Gastrointestinal:  Denies nausea, heartburn or change in bowel habits Skin: Denies abnormal skin rashes Lymphatics: Denies new lymphadenopathy or easy bruising Neurological:Denies numbness, tingling or new weaknesses Behavioral/Psych: Mood is stable, no new changes  Extremities: No lower extremity edema All other systems were reviewed with the patient and are negative.  I have reviewed the past medical history, past surgical history, social history and family history with the patient and they are unchanged from previous note.  ALLERGIES:  is allergic to bee venom and other.  MEDICATIONS:  Current Outpatient Medications  Medication Sig Dispense Refill  . albuterol (PROVENTIL HFA;VENTOLIN HFA) 108 (90 BASE) MCG/ACT inhaler Inhale 2 puffs into the lungs every 6 (six) hours as needed for wheezing. For wheezing    . amLODipine (NORVASC) 5 MG tablet Take 10 mg by mouth daily.     [name] anastrozole (ARIMIDEX) 1 MG tablet Take 1 tablet (1 mg total) daily by mouth. 90 tablet 3  . aspirin 81 MG EC tablet Take 81 mg by mouth daily.      [name] atorvastatin (LIPITOR) 40 MG tablet Take 40 mg by mouth every morning.     . carvedilol (COREG) 12.5 MG tablet Take 12.5 mg by mouth 2 (two) times daily with a meal.    . clopidogrel (PLAVIX) 75 MG tablet Take 75 mg by mouth daily.     . nitroGLYCERIN (NITROSTAT) 0.4 MG SL tablet Place 0.4 mg under the tongue every 5 (  five) minutes as needed for chest pain.     [name] oxyCODONE (OXY IR/ROXICODONE) 5 MG immediate release tablet Take 1-2 tablets (5-10 mg total) by mouth every 6 (six) hours as needed for moderate pain or severe pain. 30 tablet 0  . potassium chloride SA (K-DUR,KLOR-CON) 20 MEQ tablet Take 1 tablet (20 mEq total) by mouth 2 (two) times daily. 60 tablet 0   No current facility-administered medications for this visit.    Facility-Administered Medications Ordered in Other Visits    Medication Dose Route Frequency Provider Last Rate Last Dose  . mitoMYcin (MUTAMYCIN) Injection Use in OR only (0.4 mg/ml)  0.5 mL Right Eye Once Marylynn Pearson, MD        PHYSICAL EXAMINATION: ECOG PERFORMANCE STATUS: 1 - Symptomatic but completely ambulatory  Vitals:   09/28/17 1442  BP: 127/73  Pulse: 69  Resp: 18  Temp: 98.2 F (36.8 C)  SpO2: 100%   Filed Weights   09/28/17 1442  Weight: 115 lb 8 oz (52.4 kg)    GENERAL:alert, no distress and comfortable SKIN: skin color, texture, turgor are normal, no rashes or significant lesions EYES: normal, Conjunctiva are pink and non-injected, sclera clear OROPHARYNX:no exudate, no erythema and lips, buccal mucosa, and tongue normal  NECK: supple, thyroid normal size, non-tender, without nodularity LYMPH:  no palpable lymphadenopathy in the cervical, axillary or inguinal LUNGS: clear to auscultation and percussion with normal breathing effort HEART: regular rate & rhythm and no murmurs and no lower extremity edema ABDOMEN:abdomen soft, non-tender and normal bowel sounds MUSCULOSKELETAL:no cyanosis of digits and no clubbing  NEURO: alert & oriented x 3 with fluent speech, no focal motor/sensory deficits EXTREMITIES: No lower extremity edema  LABORATORY DATA:  I have reviewed the data as listed   Chemistry      Component Value Date/Time   NA 136 09/18/2017 0814   K 5.8 (H) 09/18/2017 0814   CL 105 09/18/2017 0814   CO2 21 (L) 09/18/2017 0814   BUN 10 09/18/2017 0814   CREATININE 0.77 09/18/2017 0814   CREATININE 0.72 02/25/2016 1216      Component Value Date/Time   CALCIUM 9.3 09/18/2017 0814   ALKPHOS 46 09/03/2017 1024   AST 20 09/03/2017 1024   ALT 13 (L) 09/03/2017 1024   BILITOT 0.8 09/03/2017 1024       Lab Results  Component Value Date   WBC 10.7 (H) 09/18/2017   HGB 9.8 (L) 09/18/2017   HCT 28.9 (L) 09/18/2017   MCV 87.0 09/18/2017   PLT 360 09/18/2017   NEUTROABS 4.0 07/30/2017    ASSESSMENT &  PLAN:  Malignant neoplasm of overlapping sites of left breast in female, estrogen receptor positive ([place]) 08/13/2017: Mammogram and ultrasound:Large left breast mass involving all quadrants measuring 7.5 cm with enlarged axillary lymph node, biopsy revealed high-grade  DCIS ER 40% PR 20%,, lymph node biopsy negative, Tis N0 stage 0  09/08/2017: Left mastectomy: IDC grade 2, 0.6 cm, extensive DCIS, high-grade with necrosis, margins negative, 1/8 lymph node with micrometastatic disease, T1BN1 mic stage I a, ER 100%, PR 100%, Ki-67 70%, HER-2 negative ratio 1.23.  Recommendation: 1.Left mastectomy 2. Followed by antiestrogen therapy with anastrozole5 years Patient history of  Stroke and coronary artery disease and therefore she did not good candidate for tamoxifen.  We will present her case in the tumor board regarding the micrometastatic disease in the axilla.  Clinically we treat Micromedics as lymph node negative.  Given her health and comorbidities may not be  a good candidate for adjuvant radiation.  I will send a prescription for antiestrogen therapy.  She will start taking the medication once she hears back from [place] after the tumor board discussion.  Return to clinic in 3 months for survivorship care visit and follow-up.    I spent 25 minutes talking to the patient of which more than half was spent in counseling and coordination of care.  No orders of the defined types were placed in this encounter.  The patient has a good understanding of the overall plan. she agrees with it. she will call with any problems that may develop before the next visit here.   Rulon Eisenmenger, MD 09/28/17

## 2017-09-29 ENCOUNTER — Encounter: Payer: Self-pay | Admitting: *Deleted

## 2017-10-09 ENCOUNTER — Telehealth: Payer: Self-pay | Admitting: *Deleted

## 2017-10-09 NOTE — Telephone Encounter (Signed)
Left message for a return phone call regarding an appointment with Dr. Sondra Come.

## 2017-10-09 NOTE — Telephone Encounter (Signed)
Received call back from patient. Informed her that per recommendations from breast conference she should discuss XRT with Dr. Sondra Come.  Informed her someone would call her with an appointment.  Patient verbalized understanding.

## 2017-10-13 ENCOUNTER — Encounter: Payer: Self-pay | Admitting: Radiation Oncology

## 2017-10-16 NOTE — Progress Notes (Signed)
Location of Breast Cancer: left breast mass  Histology per Pathology Report:  09/08/17 Diagnosis 1. Breast, simple mastectomy, Left - INVASIVE DUCTAL CARCINOMA, NOTTINGHAM GRADE 2 OF 3, 0.6 CM - EXTENSIVE DUCTAL CARCINOMA IN SITU, HIGH GRADE, WITH NECROSIS AND CALCIFICATIONS - MARGINS UNINVOLVED BY CARCINOMA - FIBROCYSTIC CHANGES INCLUDING APOCRINE METAPLASIA - PREVIOUS BIOPSY SITE CHANGES - SEE ONCOLOGY TABLE BELOW 2. Lymph node, sentinel, biopsy, Left - NO CARCINOMA IDENTIFIED IN ONE LYMPH NODE (0/1) 3. Lymph node, sentinel, biopsy, Left - NO CARCINOMA IDENTIFIED IN ONE LYMPH NODE (0/1) 4. Lymph node, sentinel, biopsy, Left - NO CARCINOMA IDENTIFIED IN ONE LYMPH NODE (0/1) 5. Lymph node, sentinel, biopsy, Left - NO CARCINOMA IDENTIFIED IN ONE LYMPH NODE (0/1) 6. Lymph node, sentinel, biopsy, Left - NO CARCINOMA IDENTIFIED IN ONE LYMPH NODE (0/1) 7. Lymph node, sentinel, biopsy, Left - NO CARCINOMA IDENTIFIED IN ONE LYMPH NODE (0/1) 8. Lymph node, sentinel, biopsy, Left - NO CARCINOMA IDENTIFIED IN ONE LYMPH NODE (0/1) 9. Lymph node, sentinel, biopsy, Left - MICROMETASTASIS INVOLVING ONE LYMPH NODE (1/1) - SEE COMMENT  08/13/17 Diagnosis 1. Breast, left, needle core biopsy, LOQ - HIGH GRADE DUCTAL CARCINOMA IN SITU WITH NECROSIS. - SEE MICROSCOPIC DESCRIPTION. 2. Breast, left, needle core biopsy, UIQ - HIGH GRADE DUCTAL CARCINOMA WITH NECROSIS. - SEE MICROSCOPIC DESCRIPTION. 3. Lymph node, needle/core biopsy, left axilla - BENIGN LYMPH NODE TISSUE. - NO MALIGNANCY IDENTIFIED.  Receptor Status: ER(100%), PR (100%), Her2-neu (negative), Ki-(70%)  Did patient present with symptoms (if so, please note symptoms) or was this found on screening mammography?: patient had felt a lump for 2-3 years.  Past/Anticipated interventions by surgeon, if any: 09/18/17 - Procedure: EVACUATION HEMATOMA  LEFT BREAST;  Surgeon: [name], MD, 09/08/17 -  Procedure: LEFT MASTECTOMY WITH  SENTINEL LYMPH NODE BIOPSY;  Surgeon: [name], MD  Past/Anticipated interventions by medical oncology, if any: anastrozole5 years  Lymphedema issues, if any:  no  Pain issues, if any:  no.  SAFETY ISSUES:  Prior radiation? no  Pacemaker/ICD? no  Possible current pregnancy?no  Is the patient on methotrexate? no  Current Complaints / other details: Patient is here with her cousin.  BP (!) 164/100 (BP Location: Right Arm, Patient Position: Sitting) Comment: Patient did not take her bp medicaiton this morning.  Pulse 61   Temp 98.2 F (36.8 C) (Oral)   Ht '5\' 4"'  (1.626 m)   Wt 120 lb 12.8 oz (54.8 kg)   SpO2 100%   BMI 20.74 kg/m    Wt Readings from Last 3 Encounters:  10/19/17 120 lb 12.8 oz (54.8 kg)  09/28/17 115 lb 8 oz (52.4 kg)  09/18/17 124 lb (56.2 kg)

## 2017-10-19 ENCOUNTER — Ambulatory Visit
Admission: RE | Admit: 2017-10-19 | Discharge: 2017-10-19 | Disposition: A | Discharge status: Home / Self Care | Payer: Medicaid Other | Source: Ambulatory Visit | Attending: Radiation Oncology | Admitting: Radiation Oncology

## 2017-10-19 ENCOUNTER — Encounter: Payer: Self-pay | Admitting: Radiation Oncology

## 2017-10-19 ENCOUNTER — Other Ambulatory Visit: Payer: Self-pay

## 2017-10-19 VITALS — BP 164/100 | HR 61 | Temp 98.2°F | Ht 64.0 in | Wt 120.8 lb

## 2017-10-19 DIAGNOSIS — C50812 Malignant neoplasm of overlapping sites of left female breast: Secondary | ICD-10-CM

## 2017-10-19 DIAGNOSIS — Z170 Estrogen receptor positive status [ER+]: Principal | ICD-10-CM

## 2017-10-19 NOTE — Progress Notes (Signed)
Radiation Oncology         [phone] ________________________________  Name: [name] MRN: [id number]  Date: 10/19/2017  DOB: [birth year]  Re-consultation Visit Note  CC: Sela Hilding, MD  [name], MD    ICD-10-CM   1. Malignant neoplasm of overlapping sites of left breast in female, estrogen receptor positive ([place]) C50.812    Z17.0     Diagnosis: Stage Ia invasive ductal carcinoma with high grade DCIS of multiple sites of the left breast, Nottingham Grade 2 (pT1b, pN58m, pMX), ER/PR(+), Her2(-), Ki-67 of 70%.   Narrative:  The patient returns today for routine follow-up since initial consult on 08/24/17. She presents into the clinic today with her cousin. Since last being seen the patient underwent left mastectomy with sentinel lymph node biopsy performed by Dr BNinfa Lindenon 09/08/17. Surgical pathology from this procedure revealed invasive ductal carcinoma of multiple site with histochemical analysis as in the above diagnosis. Additionally, eight lymph nodes were surveyed during this procedure, one of which returned positive for the microscopic presence of carcinoma. Surgical margins were negative. Following this, she also developed a significant hematoma to her surgical bed under her mastectomy flaps. Dr BNinfa Lindendecided to drain this area which was tolerated without complication. She also recently followed up with Dr GLindi Adieon 09/18/17, during which time he deemed her not to be a good candidate for chemotherapy d/t her poor performance status. She is set to begin antiestrogen oral therapy consisting of anastrozole x5 years in the near future.   In the interim, she reports that she has been doing well since her surgery. She has been taking some pain medication to help with her since the surgeries, but this has been improving and overall manageable.  Otherwise she has been healing well from both procedures.                  ALLERGIES:  is allergic to bee venom and  other.  Meds: Current Outpatient Medications  Medication Sig Dispense Refill  . amLODipine (NORVASC) 5 MG tablet Take 10 mg by mouth daily.     .Marland Kitchenaspirin 81 MG EC tablet Take 81 mg by mouth daily.      . carvedilol (COREG) 12.5 MG tablet Take 12.5 mg by mouth 2 (two) times daily with a meal.    . clopidogrel (PLAVIX) 75 MG tablet Take 75 mg by mouth daily.     . nitroGLYCERIN (NITROSTAT) 0.4 MG SL tablet Place 0.4 mg under the tongue every 5 (five) minutes as needed for chest pain.     . potassium chloride SA (K-DUR,KLOR-CON) 20 MEQ tablet Take 1 tablet (20 mEq total) by mouth 2 (two) times daily. 60 tablet 0  . albuterol (PROVENTIL HFA;VENTOLIN HFA) 108 (90 BASE) MCG/ACT inhaler Inhale 2 puffs into the lungs every 6 (six) hours as needed for wheezing. For wheezing    . anastrozole (ARIMIDEX) 1 MG tablet Take 1 tablet (1 mg total) daily by mouth. (Patient not taking: Reported on 10/19/2017) 90 tablet 3  . oxyCODONE (OXY IR/ROXICODONE) 5 MG immediate release tablet Take 1-2 tablets (5-10 mg total) by mouth every 6 (six) hours as needed for moderate pain or severe pain. (Patient not taking: Reported on 10/19/2017) 30 tablet 0   No current facility-administered medications for this encounter.    Facility-Administered Medications Ordered in Other Encounters  Medication Dose Route Frequency Provider Last Rate Last Dose  . mitoMYcin (MUTAMYCIN) Injection Use in OR only (0.4 mg/ml)  0.5 mL  Right Eye Once Marylynn Pearson, MD        Physical Findings: The patient is in no acute distress. Patient is alert and oriented.  height is '5\' 4"'  (1.626 m) and weight is 120 lb 12.8 oz (54.8 kg). Her oral temperature is 98.2 F (36.8 C). Her blood pressure is 164/100 (abnormal) and her pulse is 61. Her oxygen saturation is 100%. .   Lungs are clear to auscultation bilaterally. Heart has regular rate and rhythm. No palpable cervical, supraclavicular, or axillary adenopathy. Abdomen soft, non-tender, normal bowel  sounds. Left chest wall with mastectomy scar that is healing well without signs of drainage or infection. Right Breast with no palpable mass or nipple discharge.   Lab Findings: Lab Results  Component Value Date   WBC 10.7 (H) 09/18/2017   HGB 9.8 (L) 09/18/2017   HCT 28.9 (L) 09/18/2017   MCV 87.0 09/18/2017   PLT 360 09/18/2017    Radiographic Findings: No results found.  Impression:   The patient presents today to discuss the potential role of radiotherapy in treating her invasive ductal carcinoma of multiple sites of the left breast, Nottingham Grade 2 (pT1b, pN35m, pMX), ER/PR(+), Her2(-), Ki-67 of 70%. Due to her margins being clear at the primary site of her tumor and the presence of microscopic disease in only one lymph node (technically N0), with her chronic health co-morbidities in mind, I do not recommend a course of radiotherapy to this patient. She should maintain close follow up with medical oncology and begin her antiestrogen oral therapy as Dr GLindi Adiesees fit.   Plan: Follow up in radiation oncology prn. She'll continue close f/u w/ medical oncology. I recommend her to begin anastrozole at this time.    ____________________________________ -----------------------------------  JBlair Promise PhD, MD  This document serves as a record of services personally performed by JGery Pray MD. It was created on his behalf by WReola Mosher a trained medical scribe. The creation of this record is based on the scribe's personal observations and the provider's statements to them. This document has been checked and approved by the attending provider.

## 2017-12-02 ENCOUNTER — Telehealth: Payer: Self-pay | Admitting: Hematology and Oncology

## 2017-12-02 NOTE — Telephone Encounter (Signed)
Rescheduled appts from 2/12 due to [facility] being out of the office - spoke with patient regarding appts

## 2017-12-29 ENCOUNTER — Encounter: Payer: Medicaid Other | Admitting: Adult Health

## 2018-01-06 ENCOUNTER — Telehealth: Payer: Self-pay

## 2018-01-06 NOTE — Telephone Encounter (Signed)
Spoke with patient to remind of SCP visit on 01/13/18 at 11 am.  Patient voiced thanks and said she would be here.

## 2018-01-13 ENCOUNTER — Inpatient Hospital Stay: Payer: Medicaid Other | Attending: Adult Health | Admitting: Adult Health

## 2018-01-13 ENCOUNTER — Telehealth: Payer: Self-pay | Admitting: Adult Health

## 2018-01-13 ENCOUNTER — Encounter: Payer: Self-pay | Admitting: Adult Health

## 2018-01-13 VITALS — BP 172/87 | HR 67 | Temp 98.2°F | Resp 18 | Ht 64.0 in | Wt 122.7 lb

## 2018-01-13 DIAGNOSIS — R531 Weakness: Secondary | ICD-10-CM

## 2018-01-13 DIAGNOSIS — F17210 Nicotine dependence, cigarettes, uncomplicated: Secondary | ICD-10-CM

## 2018-01-13 DIAGNOSIS — C773 Secondary and unspecified malignant neoplasm of axilla and upper limb lymph nodes: Secondary | ICD-10-CM | POA: Diagnosis not present

## 2018-01-13 DIAGNOSIS — F4310 Post-traumatic stress disorder, unspecified: Secondary | ICD-10-CM | POA: Diagnosis not present

## 2018-01-13 DIAGNOSIS — Z79899 Other long term (current) drug therapy: Secondary | ICD-10-CM | POA: Insufficient documentation

## 2018-01-13 DIAGNOSIS — Z8669 Personal history of other diseases of the nervous system and sense organs: Secondary | ICD-10-CM | POA: Insufficient documentation

## 2018-01-13 DIAGNOSIS — M545 Low back pain: Secondary | ICD-10-CM

## 2018-01-13 DIAGNOSIS — J449 Chronic obstructive pulmonary disease, unspecified: Secondary | ICD-10-CM

## 2018-01-13 DIAGNOSIS — Z9012 Acquired absence of left breast and nipple: Secondary | ICD-10-CM | POA: Diagnosis not present

## 2018-01-13 DIAGNOSIS — Z8701 Personal history of pneumonia (recurrent): Secondary | ICD-10-CM | POA: Diagnosis not present

## 2018-01-13 DIAGNOSIS — G8929 Other chronic pain: Secondary | ICD-10-CM | POA: Insufficient documentation

## 2018-01-13 DIAGNOSIS — Z7982 Long term (current) use of aspirin: Secondary | ICD-10-CM | POA: Diagnosis not present

## 2018-01-13 DIAGNOSIS — R110 Nausea: Secondary | ICD-10-CM

## 2018-01-13 DIAGNOSIS — Z8673 Personal history of transient ischemic attack (TIA), and cerebral infarction without residual deficits: Secondary | ICD-10-CM

## 2018-01-13 DIAGNOSIS — C50812 Malignant neoplasm of overlapping sites of left female breast: Secondary | ICD-10-CM | POA: Diagnosis not present

## 2018-01-13 DIAGNOSIS — I2510 Atherosclerotic heart disease of native coronary artery without angina pectoris: Secondary | ICD-10-CM | POA: Diagnosis not present

## 2018-01-13 DIAGNOSIS — Z79811 Long term (current) use of aromatase inhibitors: Secondary | ICD-10-CM

## 2018-01-13 DIAGNOSIS — Z800 Family history of malignant neoplasm of digestive organs: Secondary | ICD-10-CM | POA: Diagnosis not present

## 2018-01-13 DIAGNOSIS — E2839 Other primary ovarian failure: Secondary | ICD-10-CM

## 2018-01-13 DIAGNOSIS — Z1239 Encounter for other screening for malignant neoplasm of breast: Secondary | ICD-10-CM

## 2018-01-13 DIAGNOSIS — I252 Old myocardial infarction: Secondary | ICD-10-CM | POA: Diagnosis not present

## 2018-01-13 NOTE — Progress Notes (Signed)
CLINIC:  Survivorship   REASON FOR VISIT:  Routine follow-up post-treatment for a recent history of breast cancer.   BRIEF ONCOLOGIC HISTORY:    Ductal carcinoma in situ (DCIS) of left breast   08/13/2017 Initial Diagnosis    Large left breast mass involving all quadrants measuring 7.5 cm with enlarged axillary lymph node, biopsy revealed high-grade  DCIS ER 40% PR 20%,, lymph node biopsy negative, Tis N0 stage 0      09/08/2017 Surgery    Left mastectomy: IDC grade 2, 0.6 cm, extensive high-grade DCIS with necrosis, margins negative, 1/8 lymph nodes with micrometastases, T1bN1Mic (Stage 1A)       Malignant neoplasm of overlapping sites of left breast in female, estrogen receptor positive ([place])   08/19/2017 Initial Diagnosis    Malignant neoplasm of overlapping sites of left breast in female, estrogen receptor positive ([place])      09/08/2017 Surgery    Left mastectomy: IDC grade 2, 0.6 cm, extensive DCIS, high-grade with necrosis, margins negative, 1/8 lymph node with micrometastatic disease, T1BN1 mic stage I a, ER 100%, PR 100%, Ki-67 70%, HER-2 negative ratio 1.23.      09/2017 -  Anti-estrogen oral therapy    Anastrozole daily       INTERVAL HISTORY:  [name] presents to the [facility] today for our initial meeting to review her survivorship care plan detailing her treatment course for breast cancer, as well as monitoring long-term side effects of that treatment, education regarding health maintenance, screening, and overall wellness and health promotion.     Overall, [name] reports feeling moderately well.  She has had some nausea since starting the anastrozole.  She is still eating.  She denies any hot flashes, joint aches, vaginal dryness.  She is doing well otherwise.      REVIEW OF SYSTEMS:  Review of Systems  Constitutional: Negative for appetite change, chills, diaphoresis, fatigue, fever and unexpected weight change.  HENT:   Negative for hearing  loss and lump/mass.   Eyes: Negative for eye problems and icterus.  Respiratory: Negative for chest tightness, cough and shortness of breath.   Cardiovascular: Negative for chest pain, leg swelling and palpitations.  Gastrointestinal: Positive for nausea. Negative for abdominal distention, abdominal pain, constipation, diarrhea and vomiting.  Endocrine: Negative for hot flashes.  Musculoskeletal: Negative for arthralgias.  Skin: Negative for itching and rash.  Neurological: Negative for dizziness, extremity weakness, headaches and numbness.  Psychiatric/Behavioral: Negative for depression. The patient is not nervous/anxious.   Breast: Denies any new nodularity, masses, tenderness, nipple changes, or nipple discharge.      ONCOLOGY TREATMENT TEAM:  1. Surgeon:  Dr. Ninfa Linden at [facility] 2. Medical Oncologist: Dr. Lindi Adie  3. Radiation Oncologist: Dr. Sondra Come    PAST MEDICAL/SURGICAL HISTORY:  Past Medical History:  Diagnosis Date  . Adjustment disorder with depressed mood 01/11/2009  . Arthritis   . Asthma   . Breast cancer ([place])    left breast  . CAD 07/10/2008  . Chronic back pain   . COCAINE DEPENDENCY NOS 01/14/2007   stopped Cocaine 15 years   . CONVULSIONS, SEIZURES, NOS 01/14/2007   last seizure May, 2016  . COPD (chronic obstructive pulmonary disease) ([place])   . Coronary artery disease 01/14/2012  . CVA 01/14/2007  . Difficulty seeing    patient has glaucoma  . Family history of anesthesia complication 8366   Pts sister (65 years old) died after having anesthesia - induced a heart attcks  .  Glaucoma of both eyes   . Headache(784.0)   . HEPATITIS C 02/26/2009  . Hepatitis C   . Hyperlipemia   . HYPERTENSION, BENIGN SYSTEMIC 01/14/2007  . Memory loss    d/t CVA  . MI (myocardial infarction) ([place]) 12/20/2007   had stent placed  . Pneumonia    hx of  . PTSD (post-traumatic stress disorder)   . PTSD (post-traumatic stress disorder)   . Right sided weakness     d/t stroke  . Sciatica   . Stroke [facility])    right side weakness  . TOBACCO DEPENDENCE 01/14/2007   Past Surgical History:  Procedure Laterality Date  . BREAST LUMPECTOMY Left 09/18/2017   Procedure: EVACUATION HEMATOMA  LEFT BREAST;  Surgeon: [name], MD;  Location: [place];  Service: General;  Laterality: Left;  . CATARACT EXTRACTION W/PHACO Right 07/11/2015   Procedure: CATARACT EXTRACTION PHACO AND INTRAOCULAR LENS PLACEMENT ([place]) RIGHT,INTERNAL PLEB REVISION;  Surgeon: Marylynn Pearson, MD;  Location: [place];  Service: Ophthalmology;  Laterality: Right;  . CESAREAN SECTION     x 2  . COLONOSCOPY W/ POLYPECTOMY    . CORONARY ANGIOPLASTY WITH STENT PLACEMENT  2009   1 stent  . EYE SURGERY    . MASTECTOMY W/ SENTINEL NODE BIOPSY Left 09/08/2017   Procedure: LEFT MASTECTOMY WITH SENTINEL LYMPH NODE BIOPSY;  Surgeon: [name], MD;  Location: [place];  Service: General;  Laterality: Left;  . MITOMYCIN C APPLICATION Right 32/35/5732   Procedure: MITOMYCIN C APPLICATION RIGHT EYE;  Surgeon: Marylynn Pearson, MD;  Location: [place];  Service: Ophthalmology;  Laterality: Right;  . TONSILLECTOMY    . TRABECULECTOMY Left 06/29/2013   Procedure: TRABECULECTOMY WITH MYTOMICIN C LEFT EYE;  Surgeon: Marylynn Pearson, MD;  Location: [place];  Service: Ophthalmology;  Laterality: Left;  . TRABECULECTOMY Right 09/07/2013   Procedure: TRABECULECTOMY RIGHT EYE;  Surgeon: Marylynn Pearson, MD;  Location: [place];  Service: Ophthalmology;  Laterality: Right;     ALLERGIES:  Allergies  Allergen Reactions  . Bee Venom Anaphylaxis  . Other     Patient states she has been told not to take ibuprofen and tylenol     CURRENT MEDICATIONS:  Outpatient Encounter Medications as of 01/13/2018  Medication Sig  . albuterol (PROVENTIL HFA;VENTOLIN HFA) 108 (90 BASE) MCG/ACT inhaler Inhale 2 puffs into the lungs every 6 (six) hours as needed for wheezing. For wheezing  . amLODipine (NORVASC) 5 MG tablet Take 10 mg by  mouth daily.   [name] anastrozole (ARIMIDEX) 1 MG tablet Take 1 tablet (1 mg total) daily by mouth.  [name] aspirin 81 MG EC tablet Take 81 mg by mouth daily.    . carvedilol (COREG) 12.5 MG tablet Take 12.5 mg by mouth 2 (two) times daily with a meal.  . clopidogrel (PLAVIX) 75 MG tablet Take 75 mg by mouth daily.   . nitroGLYCERIN (NITROSTAT) 0.4 MG SL tablet Place 0.4 mg under the tongue every 5 (five) minutes as needed for chest pain.   . potassium chloride SA (K-DUR,KLOR-CON) 20 MEQ tablet Take 1 tablet (20 mEq total) by mouth 2 (two) times daily.  . [DISCONTINUED] oxyCODONE (OXY IR/ROXICODONE) 5 MG immediate release tablet Take 1-2 tablets (5-10 mg total) by mouth every 6 (six) hours as needed for moderate pain or severe pain. (Patient not taking: Reported on 10/19/2017)   Facility-Administered Encounter Medications as of 01/13/2018  Medication  . mitoMYcin (MUTAMYCIN) Injection Use in OR only (0.4 mg/ml)     ONCOLOGIC  FAMILY HISTORY:  Family History  Problem Relation Age of Onset  . Stroke Mother   . Colon cancer Paternal Uncle   . Heart disease Sister   . Colon cancer Paternal Aunt      GENETIC COUNSELING/TESTING: Not at this time  SOCIAL HISTORY:  [name] lives alone in [place], [place].  [name] is currently retired.  She denies any current or history of tobacco, alcohol, or illicit drug use.     PHYSICAL EXAMINATION:  Vital Signs:   Vitals:   01/13/18 1117  BP: (!) 172/87  Pulse: 67  Resp: 18  Temp: 98.2 F (36.8 C)  SpO2: 100%   Filed Weights   01/13/18 1117  Weight: 122 lb 11.2 oz (55.7 kg)   General: Well-nourished, well-appearing female in no acute distress.  She is accompanied in clinic by her sister today.   HEENT: Head is normocephalic.  Pupils equal and reactive to light. Conjunctivae clear without exudate.  Sclerae anicteric. Oral mucosa is pink, moist.  Oropharynx is pink without lesions or erythema.  Lymph: No cervical, supraclavicular, or  infraclavicular lymphadenopathy noted on palpation.  Cardiovascular: Regular rate and rhythm.[name] Respiratory: Clear to auscultation bilaterally. Chest expansion symmetric; breathing non-labored.  Breasts: s/p left mastectomy, no nodules, masses, skin changes noted, right breast without any nodules, masses, skin or nipple changes GI: Abdomen soft and round; non-tender, non-distended. Bowel sounds normoactive.  GU: Deferred.  Neuro: No focal deficits. Steady gait.  Psych: Mood and affect normal and appropriate for situation.  Extremities: No edema. MSK: No focal spinal tenderness to palpation.  Full range of motion in bilateral upper extremities Skin: Warm and dry.  LABORATORY DATA:  None for this visit.  DIAGNOSTIC IMAGING:  None for this visit.      ASSESSMENT AND PLAN:  Ms.. [name] is a pleasant 62 y.o. female with Stage IA left breast invasive ductal carcinoma, ER+/PR+/HER2-, diagnosed in 08/2017, treated with mastectomy and anti estrogen therapy with Anastrozole starting in 09/2017.  She presents to the Survivorship Clinic for our initial meeting and routine follow-up post-completion of treatment for breast cancer.    1. Stage IA left breast cancer:  [name] is continuing to recover from definitive treatment for breast cancer. She will follow-up with her medical oncologist, Dr. Lindi Adie in 6 months with history and physical exam per surveillance protocol.  She will continue her anti-estrogen therapy with Anastrozole. (see bullet #2). She was instructed to make Dr. Lindi Adie or myself aware if she begins to experience any worsening side effects of the medication and I could see her back in clinic to help manage those side effects, as needed. Today, a comprehensive survivorship care plan and treatment summary was reviewed with the patient today detailing her breast cancer diagnosis, treatment course, potential late/long-term effects of treatment, appropriate follow-up care with recommendations for  the future, and patient education resources.  A copy of this summary, along with a letter will be sent to the patient's primary care provider via mail/fax/In Basket message after today's visit.    2. Nausea: Thi sis new and she reports it started once she started taking Anastrozole.  I recommended that she switch the Anastrozole to at night and see if that would help.  If it doesn't, then we may need to consider a drug holiday for a week or two, or switching all together to Letrozole.   3. Bone health:  Given [name]'s age/history of breast cancer and her current treatment regimen including anti-estrogen  therapy with Anastrozole, she is at risk for bone demineralization.  She has not undergone DEXA testing and I ordered this for her today.  In the meantime, she was encouraged to increase her consumption of foods rich in calcium, as well as increase her weight-bearing activities.  She was given education on specific activities to promote bone health.  4. Cancer screening:  Due to [name]'s history and her age, she should receive screening for skin cancers, colon cancer, and gynecologic cancers.  The information and recommendations are listed on the patient's comprehensive care plan/treatment summary and were reviewed in detail with the patient.    5. Health maintenance and wellness promotion: [name] was encouraged to consume 5-7 servings of fruits and vegetables per day. We reviewed the "Nutrition Rainbow" handout, as well as the handout "Take Control of Your Health and Reduce Your Cancer Risk" from the [place].  She was also encouraged to engage in moderate to vigorous exercise for 30 minutes per day most days of the week. We discussed the [employer] fitness program, which is designed for cancer survivors to help them become more physically fit after cancer treatments.  She was instructed to limit her alcohol consumption and continue to abstain from tobacco use.     6. Support  services/counseling: It is not uncommon for this period of the patient's cancer care trajectory to be one of many emotions and stressors.  We discussed an opportunity for her to participate in the next session of [facility] ("Finding Your New Normal") support group series designed for patients after they have completed treatment.   [name] was encouraged to take advantage of our many other support services programs, support groups, and/or counseling in coping with her new life as a cancer survivor after completing anti-cancer treatment.  She was offered support today through active listening and expressive supportive counseling.  She was given information regarding our available services and encouraged to contact me with any questions or for help enrolling in any of our support group/programs.    Dispo:   -Return to cancer center in 6 months for f/u with Dr. Lindi Adie  -Right breast screening mammogram due in 07/2018 -Follow up with Dr. Ninfa Linden is due -Bone Density due -She is welcome to return back to the Survivorship Clinic at any time; no additional follow-up needed at this time.  -Consider referral back to survivorship as a long-term survivor for continued surveillance  A total of (30) minutes of face-to-face time was spent with this patient with greater than 50% of that time in counseling and care-coordination.   Gardenia Phlegm, [place] [phone]   Note: PRIMARY CARE PROVIDER Sela Hilding, [place] [phone]

## 2018-01-13 NOTE — Telephone Encounter (Signed)
Gave patient AVS and calendar of upcoming august appointments °

## 2018-05-26 ENCOUNTER — Telehealth: Payer: Self-pay | Admitting: Family Medicine

## 2018-05-26 NOTE — Telephone Encounter (Signed)
Informed pt that I contacted [facility] and that I faxed the information over there. When I called I spoke with Karena Addison, I tried to verify that they received the fax but the phones had already been sent over to [facility].  Pt stated that she would contact them tomorrow to be sure they received what they needed. Katharina Caper, April D, [place]

## 2018-05-26 NOTE — Telephone Encounter (Signed)
Pt called and said our office has to fax [facility] [employer] to let them know of her appointment on Monday so they can go get her and bring her to this appointment. Their fax number is [phone]. Pt said this needs to be done asap so they know to get her. Please advise

## 2018-05-31 ENCOUNTER — Other Ambulatory Visit: Payer: Self-pay

## 2018-05-31 ENCOUNTER — Ambulatory Visit (INDEPENDENT_AMBULATORY_CARE_PROVIDER_SITE_OTHER): Payer: Medicaid Other | Admitting: Family Medicine

## 2018-05-31 ENCOUNTER — Encounter: Payer: Self-pay | Admitting: Family Medicine

## 2018-05-31 VITALS — BP 132/66 | HR 57 | Temp 98.1°F | Ht 64.0 in | Wt 114.8 lb

## 2018-05-31 DIAGNOSIS — R110 Nausea: Secondary | ICD-10-CM | POA: Diagnosis not present

## 2018-05-31 DIAGNOSIS — I2510 Atherosclerotic heart disease of native coronary artery without angina pectoris: Secondary | ICD-10-CM

## 2018-05-31 DIAGNOSIS — Z8619 Personal history of other infectious and parasitic diseases: Secondary | ICD-10-CM | POA: Diagnosis not present

## 2018-05-31 MED ORDER — METOCLOPRAMIDE HCL 5 MG PO TABS: 5.0000 mg | ORAL_TABLET | Freq: Three times a day (TID) | ORAL | 0 refills | Status: DC | PRN

## 2018-05-31 NOTE — Assessment & Plan Note (Addendum)
Dr. Terrence Dupont manages her blood pressure cardiology concerns.  Pulse of 57, I am concerned if the patient continues to lose weight 2/2 nausea we may need to adjust her beta-blockade

## 2018-05-31 NOTE — Progress Notes (Signed)
   CC: nausea  HPI  Nausea - x 1 year. Thought it would improve after breast cancer treatment. After eating a meal, 10 min later she is nauseated and rushing to the bathroom in case she vomits, but denies ever vomiting several times. Occasional diarrhea. No actual vomiting. States this predates the breast cancer treatment (note from Oncology related nausea to her anastrozole). Having trouble sleeping too. No abdominal swelling. Is able to nibble on things. Very sure this was coming on before breast cancer treatment.   Harwani is her cardiologist. Doesn't take lipitor, stopped on her own.   Wt Readings from Last 3 Encounters:  05/31/18 114 lb 12.8 oz (52.1 kg)  01/13/18 122 lb 11.2 oz (55.7 kg)  10/19/17 120 lb 12.8 oz (54.8 kg)    ROS: Denies CP, SOB, abdominal pain, dysuria, changes in BMs.   CC, SH/smoking status, and VS noted  Objective: BP 132/66   Pulse (!) 57   Temp 98.1 F (36.7 C) (Oral)   Ht 5' 4" (1.626 m)   Wt 114 lb 12.8 oz (52.1 kg)   SpO2 99%   BMI 19.71 kg/m  Gen: NAD, alert, cooperative, and pleasant thin female.  HEENT: NCAT, oropharnyx normal CV: RRR, no murmur Resp: CTAB, no wheezes, non-labored Abd: SNTND, BS present, no guarding or organomegaly Ext: No edema, warm Neuro: Alert and oriented, Speech clear, No gross deficits  Assessment and plan:  Chronic nausea unclear etiology with concerning weight loss.  Will start with labs and will likely need imaging based on these.  Considered ascites secondary to history of hepatitis C with moderate fibrosis, however no ascites present on abdominal exam.  Could consider delayed gastric emptying as well as metastatic disease of some sort.  Will trial Reglan as this could aid in gastric emptying, and follow-up in 2 weeks after obtaining labs and likely imaging.  Return to the emergency room if worsening abdominal pain or persistent vomiting additionally, patient is smoking marijuana 3 to 4 days/week, asked her to stop  this completely over the next 2 weeks to see if this helps her nausea, could be related to cannabis use.  Coronary atherosclerosis Dr. Harwani manages her blood pressure cardiology concerns.  Pulse of 57, I am concerned if the patient continues to lose weight 2/2 nausea we may need to adjust her beta-blockade   Orders Placed This Encounter  Procedures  . CBC  . CMP14+EGFR  . Protime-INR  . TSH  . Sedimentation Rate    Meds ordered this encounter  Medications  . metoCLOPramide (REGLAN) 5 MG tablet    Sig: Take 1 tablet (5 mg total) by mouth every 8 (eight) hours as needed for nausea.    Dispense:  30 tablet    Refill:  0     Kate , MD, PGY3 05/31/2018 2:22 PM  

## 2018-05-31 NOTE — Patient Instructions (Signed)
It was a pleasure to see you today! Thank you for choosing [facility] for your primary care. [name] was seen for nausea.   Our plans for today were:  Try not to smoke, you can use the nausea medicine up to three times per day. Call me if you have any side effects from this. I will call you with the lab results and we will likely do some imaging of your belly after that.    You should return to our clinic to see Dr. Lindell Noe in 3 weeks for nausea.   Best,  Dr. Lindell Noe

## 2018-05-31 NOTE — Assessment & Plan Note (Addendum)
unclear etiology with concerning weight loss.  Will start with labs and will likely need imaging based on these.  Considered ascites secondary to history of hepatitis C with moderate fibrosis, however no ascites present on abdominal exam.  Could consider delayed gastric emptying as well as metastatic disease of some sort.  Will trial Reglan as this could aid in gastric emptying, and follow-up in 2 weeks after obtaining labs and likely imaging.  Return to the emergency room if worsening abdominal pain or persistent vomiting additionally, patient is smoking marijuana 3 to 4 days/week, asked her to stop this completely over the next 2 weeks to see if this helps her nausea, could be related to cannabis use.

## 2018-06-01 LAB — CMP14+EGFR
ALK PHOS: 48 IU/L (ref 39–117)
ALT: 7 IU/L (ref 0–32)
AST: 14 IU/L (ref 0–40)
Albumin/Globulin Ratio: 1.9 (ref 1.2–2.2)
Albumin: 4.7 g/dL (ref 3.6–4.8)
BUN/Creatinine Ratio: 13 (ref 12–28)
BUN: 13 mg/dL (ref 8–27)
Bilirubin Total: 0.3 mg/dL (ref 0.0–1.2)
CO2: 25 mmol/L (ref 20–29)
CREATININE: 1 mg/dL (ref 0.57–1.00)
Calcium: 10.1 mg/dL (ref 8.7–10.3)
Chloride: 100 mmol/L (ref 96–106)
GFR calc Af Amer: 69 mL/min/{1.73_m2} (ref >59)
GFR calc non Af Amer: 60 mL/min/{1.73_m2} (ref >59)
GLOBULIN, TOTAL: 2.5 g/dL (ref 1.5–4.5)
GLUCOSE: 84 mg/dL (ref 65–99)
Potassium: 4.3 mmol/L (ref 3.5–5.2)
SODIUM: 141 mmol/L (ref 134–144)
Total Protein: 7.2 g/dL (ref 6.0–8.5)

## 2018-06-01 LAB — CBC
Hematocrit: 35.4 % (ref 34.0–46.6)
Hemoglobin: 12 g/dL (ref 11.1–15.9)
MCH: 29.3 pg (ref 26.6–33.0)
MCHC: 33.9 g/dL (ref 31.5–35.7)
MCV: 87 fL (ref 79–97)
PLATELETS: 265 10*3/uL (ref 150–450)
RBC: 4.09 x10E6/uL (ref 3.77–5.28)
RDW: 13.6 % (ref 12.3–15.4)
WBC: 9 10*3/uL (ref 3.4–10.8)

## 2018-06-01 LAB — SEDIMENTATION RATE: Sed Rate: 2 mm/hr (ref 0–40)

## 2018-06-01 LAB — TSH: TSH: 0.812 u[IU]/mL (ref 0.450–4.500)

## 2018-06-01 LAB — PROTIME-INR
INR: 1.2 (ref 0.8–1.2)
PROTHROMBIN TIME: 11.9 s (ref 9.1–12.0)

## 2018-06-03 ENCOUNTER — Telehealth: Payer: Self-pay | Admitting: Family Medicine

## 2018-06-03 DIAGNOSIS — R634 Abnormal weight loss: Secondary | ICD-10-CM

## 2018-06-03 NOTE — Telephone Encounter (Signed)
Called patient to discuss lab results, no answer and generic voicemail.  Left voicemail asking her to call back the clinic.  If she calls back, please assess whether she is still having significant nausea and inability to eat.  Ask whether she picked up the new medicine as well.  Her labs do not show any cause for her chronic nausea.  If she is still nauseous, we need to pursue imaging.

## 2018-06-07 NOTE — Telephone Encounter (Signed)
Contacted pt to inform her of her appointment time.  It is scheduled for 06/17/18 @ 3:30, she is to arrive at 3:15.  Nothing to eat or drink after 11:30am on that day and she is to pick up contrast between now and the 31st. Pt understood. Katharina Caper, Keundra Petrucelli D, [place]

## 2018-06-07 NOTE — Telephone Encounter (Signed)
Pt called nurse line in regards to pcp [facility]. Pt is feeling much better since starting the new medication. Pt stated, "im eating and everything." Pt had no further questions or concerns.

## 2018-06-07 NOTE — Telephone Encounter (Signed)
Patient says the pills work good and she is able to eat. She "can't stay out of the kitchen." she understands that we still need to evaluate her anatomy for causes of chronic nausea, she would appreciate a morning appt for her CT scan (precepted w Hensel at visit).

## 2018-06-17 ENCOUNTER — Ambulatory Visit (HOSPITAL_COMMUNITY)
Admission: RE | Admit: 2018-06-17 | Discharge: 2018-06-17 | Disposition: A | Discharge status: Home / Self Care | Payer: Medicaid Other | Source: Ambulatory Visit | Attending: Family Medicine | Admitting: Family Medicine

## 2018-06-17 DIAGNOSIS — I700 Atherosclerosis of aorta: Secondary | ICD-10-CM | POA: Diagnosis not present

## 2018-06-17 DIAGNOSIS — R634 Abnormal weight loss: Secondary | ICD-10-CM | POA: Diagnosis not present

## 2018-06-17 MED ORDER — IOHEXOL 300 MG/ML  SOLN
80.0000 mL | Freq: Once | INTRAMUSCULAR | Status: AC | PRN
  Administered 2018-06-17: 80 mL via INTRAVENOUS

## 2018-06-21 ENCOUNTER — Telehealth: Payer: Self-pay | Admitting: Family Medicine

## 2018-06-21 NOTE — Telephone Encounter (Signed)
White team, can you call patient (I'm on night float)? Please let her know the following:  Her CT scan is normal, and does not show a cause of her chronic nausea. She should continue the medicine we started (metaclopramide) as this has been helping. I'm happy to see her again if the nausea comes back or gets worse. She may have something called gastroparesis (slow emptying of her stomach and bowels) that causes nausea, and the metaclopramide can help with the emptying speed. I'm happy to place a GI referral if she wants to see GI to confirm this diagnosis.

## 2018-06-24 NOTE — Telephone Encounter (Signed)
Spoke to pt. Informed her of the information below. Pt had good understanding. Ottis Stain, CMA

## 2018-07-06 ENCOUNTER — Other Ambulatory Visit: Payer: Self-pay

## 2018-07-06 MED ORDER — METOCLOPRAMIDE HCL 5 MG PO TABS: 5.0000 mg | ORAL_TABLET | Freq: Three times a day (TID) | ORAL | 0 refills | Status: DC | PRN

## 2018-07-06 NOTE — Telephone Encounter (Signed)
Pt requesting refill of metoclopramide for nausea to [place] [facility] Wallace Cullens, RN

## 2018-07-13 ENCOUNTER — Telehealth: Payer: Self-pay | Admitting: Hematology and Oncology

## 2018-07-13 ENCOUNTER — Ambulatory Visit: Payer: Medicaid Other | Admitting: Hematology and Oncology

## 2018-07-13 NOTE — Telephone Encounter (Signed)
Patient called to reschedule  °

## 2018-07-13 NOTE — Assessment & Plan Note (Deleted)
08/13/2017: Mammogram and ultrasound:Large left breast mass involving all quadrants measuring 7.5 cm with enlarged axillary lymph node, biopsy revealed high-grade  DCIS ER 40% PR 20%,, lymph node biopsy negative, Tis N0 stage 0  09/08/2017: Left mastectomy: IDC grade 2, 0.6 cm, extensive DCIS, high-grade with necrosis, margins negative, 1/8 lymph node with micrometastatic disease, T1BN1 mic stage I a, ER 100%, PR 100%, Ki-67 70%, HER-2 negative ratio 1.23 After discussion in tumor board, she was felt that she does not need radiation mainly because of her co-morbidities  Current treatment: Anastrozole 1 mg daily started 09/28/2017  Anastrozole toxicities:  Return to clinic in 1 year for surveillance and follow-up

## 2018-07-22 ENCOUNTER — Inpatient Hospital Stay: Payer: Medicaid Other | Admitting: Hematology and Oncology

## 2018-07-22 NOTE — Assessment & Plan Note (Deleted)
08/13/2017: Mammogram and ultrasound:Large left breast mass involving all quadrants measuring 7.5 cm with enlarged axillary lymph node, biopsy revealed high-grade  DCIS ER 40% PR 20%,, lymph node biopsy negative, Tis N0 stage 0  09/08/2017: Left mastectomy: IDC grade 2, 0.6 cm, extensive DCIS, high-grade with necrosis, margins negative, 1/8 lymph node with micrometastatic disease, T1BN1 mic stage I a, ER 100%, PR 100%, Ki-67 70%, HER-2 negative ratio 1.23.  Current treatment: Anastrozole 1 mg daily x5 years started November 2018 Patient history of  Stroke and coronary artery disease and therefore she did not good candidate for tamoxifen. Given her health and comorbidities may not be a good candidate for adjuvant radiation  Anastrozole toxicities: Mild nausea Denies arthralgias or myalgias.  Breast cancer surveillance: 1.  Breast exam 01/13/2018: Benign 2. Mammogram and bone density scheduled for 08/09/2018  Return to clinic in 1 year for follow-up

## 2018-08-05 ENCOUNTER — Ambulatory Visit: Payer: Medicaid Other | Admitting: Hematology and Oncology

## 2018-08-09 ENCOUNTER — Ambulatory Visit: Payer: Medicaid Other

## 2018-08-09 ENCOUNTER — Other Ambulatory Visit: Payer: Medicaid Other

## 2018-08-11 ENCOUNTER — Inpatient Hospital Stay: Payer: Medicaid Other | Attending: Hematology and Oncology | Admitting: Hematology and Oncology

## 2018-08-11 NOTE — Assessment & Plan Note (Deleted)
08/13/2017: Mammogram and ultrasound:Large left breast mass involving all quadrants measuring 7.5 cm with enlarged axillary lymph node, biopsy revealed high-grade  DCIS ER 40% PR 20%,, lymph node biopsy negative, Tis N0 stage 0  09/08/2017: Left mastectomy: IDC grade 2, 0.6 cm, extensive DCIS, high-grade with necrosis, margins negative, 1/8 lymph node with micrometastatic disease, T1BN1 mic stage I a, ER 100%, PR 100%, Ki-67 70%, HER-2 negative ratio 1.23.  Patient was not a good candidate for adjuvant radiation  Current treatment: Anastrozole 1 mg daily x5 years started 09/28/2017  Anastrozole toxicities:  Breast cancer surveillance: 1.  Mammograms annually 2. breast exams periodically  Return to clinic in 1 year for follow-up

## 2018-10-22 ENCOUNTER — Other Ambulatory Visit: Payer: Self-pay

## 2018-10-22 ENCOUNTER — Observation Stay (HOSPITAL_COMMUNITY)
Admission: EM | Admit: 2018-10-22 | Discharge: 2018-10-24 | Disposition: A | Discharge status: Home / Self Care | Payer: Medicaid Other | Attending: Internal Medicine | Admitting: Internal Medicine

## 2018-10-22 ENCOUNTER — Emergency Department (HOSPITAL_COMMUNITY): Payer: Medicaid Other

## 2018-10-22 ENCOUNTER — Encounter (HOSPITAL_COMMUNITY): Payer: Self-pay | Admitting: Emergency Medicine

## 2018-10-22 DIAGNOSIS — J45909 Unspecified asthma, uncomplicated: Secondary | ICD-10-CM | POA: Insufficient documentation

## 2018-10-22 DIAGNOSIS — Z79899 Other long term (current) drug therapy: Secondary | ICD-10-CM | POA: Insufficient documentation

## 2018-10-22 DIAGNOSIS — R55 Syncope and collapse: Secondary | ICD-10-CM | POA: Diagnosis not present

## 2018-10-22 DIAGNOSIS — E876 Hypokalemia: Secondary | ICD-10-CM | POA: Diagnosis present

## 2018-10-22 DIAGNOSIS — F17210 Nicotine dependence, cigarettes, uncomplicated: Secondary | ICD-10-CM | POA: Insufficient documentation

## 2018-10-22 DIAGNOSIS — R001 Bradycardia, unspecified: Secondary | ICD-10-CM

## 2018-10-22 DIAGNOSIS — Z955 Presence of coronary angioplasty implant and graft: Secondary | ICD-10-CM | POA: Insufficient documentation

## 2018-10-22 DIAGNOSIS — Z170 Estrogen receptor positive status [ER+]: Secondary | ICD-10-CM | POA: Insufficient documentation

## 2018-10-22 DIAGNOSIS — F1210 Cannabis abuse, uncomplicated: Secondary | ICD-10-CM | POA: Diagnosis not present

## 2018-10-22 DIAGNOSIS — I10 Essential (primary) hypertension: Secondary | ICD-10-CM | POA: Diagnosis present

## 2018-10-22 DIAGNOSIS — Z7902 Long term (current) use of antithrombotics/antiplatelets: Secondary | ICD-10-CM | POA: Diagnosis not present

## 2018-10-22 DIAGNOSIS — R110 Nausea: Secondary | ICD-10-CM | POA: Diagnosis present

## 2018-10-22 DIAGNOSIS — C50812 Malignant neoplasm of overlapping sites of left female breast: Secondary | ICD-10-CM

## 2018-10-22 DIAGNOSIS — Z7982 Long term (current) use of aspirin: Secondary | ICD-10-CM | POA: Insufficient documentation

## 2018-10-22 DIAGNOSIS — I2510 Atherosclerotic heart disease of native coronary artery without angina pectoris: Secondary | ICD-10-CM | POA: Diagnosis not present

## 2018-10-22 LAB — PROTIME-INR
INR: 1.26
Prothrombin Time: 15.6 seconds — ABNORMAL HIGH (ref 11.4–15.2)

## 2018-10-22 LAB — CBC WITH DIFFERENTIAL/PLATELET
Abs Immature Granulocytes: 0.02 10*3/uL (ref 0.00–0.07)
BASOS ABS: 0 10*3/uL (ref 0.0–0.1)
BASOS PCT: 0 %
EOS ABS: 0.1 10*3/uL (ref 0.0–0.5)
EOS PCT: 1 %
HCT: 31 % — ABNORMAL LOW (ref 36.0–46.0)
Hemoglobin: 10.1 g/dL — ABNORMAL LOW (ref 12.0–15.0)
Immature Granulocytes: 0 %
Lymphocytes Relative: 23 %
Lymphs Abs: 2.1 10*3/uL (ref 0.7–4.0)
MCH: 28.9 pg (ref 26.0–34.0)
MCHC: 32.6 g/dL (ref 30.0–36.0)
MCV: 88.6 fL (ref 80.0–100.0)
Monocytes Absolute: 0.7 10*3/uL (ref 0.1–1.0)
Monocytes Relative: 8 %
NEUTROS PCT: 68 %
NRBC: 0 % (ref 0.0–0.2)
Neutro Abs: 6.1 10*3/uL (ref 1.7–7.7)
PLATELETS: 206 10*3/uL (ref 150–400)
RBC: 3.5 MIL/uL — ABNORMAL LOW (ref 3.87–5.11)
RDW: 13.1 % (ref 11.5–15.5)
WBC: 8.9 10*3/uL (ref 4.0–10.5)

## 2018-10-22 LAB — COMPREHENSIVE METABOLIC PANEL
ALT: 8 U/L (ref 0–44)
ANION GAP: 6 (ref 5–15)
AST: 14 U/L — ABNORMAL LOW (ref 15–41)
Albumin: 3.5 g/dL (ref 3.5–5.0)
Alkaline Phosphatase: 39 U/L (ref 38–126)
BILIRUBIN TOTAL: 0.3 mg/dL (ref 0.3–1.2)
BUN: 15 mg/dL (ref 8–23)
CALCIUM: 8.3 mg/dL — AB (ref 8.9–10.3)
CO2: 28 mmol/L (ref 22–32)
Chloride: 107 mmol/L (ref 98–111)
Creatinine, Ser: 1.09 mg/dL — ABNORMAL HIGH (ref 0.44–1.00)
GFR, EST NON AFRICAN AMERICAN: 54 mL/min — AB (ref >60)
Glucose, Bld: 97 mg/dL (ref 70–99)
POTASSIUM: 3 mmol/L — AB (ref 3.5–5.1)
Sodium: 141 mmol/L (ref 135–145)
TOTAL PROTEIN: 6.4 g/dL — AB (ref 6.5–8.1)

## 2018-10-22 LAB — ETHANOL: Alcohol, Ethyl (B): <10 mg/dL (ref <10)

## 2018-10-22 LAB — TROPONIN I: Troponin I: <0.03 ng/mL (ref <0.03)

## 2018-10-22 LAB — LIPASE, BLOOD: LIPASE: 53 U/L — AB (ref 11–51)

## 2018-10-22 MED ORDER — SODIUM CHLORIDE 0.9 % IV SOLN
INTRAVENOUS | Status: DC
  Administered 2018-10-22: 21:00:00 via INTRAVENOUS

## 2018-10-22 MED ORDER — ONDANSETRON HCL 4 MG/2ML IJ SOLN
4.0000 mg | Freq: Once | INTRAMUSCULAR | Status: AC
  Administered 2018-10-22: 4 mg via INTRAVENOUS
  Filled 2018-10-22: qty 2

## 2018-10-22 MED ORDER — NALOXONE HCL 0.4 MG/ML IJ SOLN
0.4000 mg | Freq: Once | INTRAMUSCULAR | Status: AC
  Administered 2018-10-22: 0.4 mg via INTRAVENOUS
  Filled 2018-10-22: qty 1

## 2018-10-22 MED ORDER — SODIUM CHLORIDE 0.9 % IV BOLUS
500.0000 mL | Freq: Once | INTRAVENOUS | Status: AC
  Administered 2018-10-22: 500 mL via INTRAVENOUS

## 2018-10-22 NOTE — ED Triage Notes (Signed)
Per [facility] pt had syncopal episode and fell, upon EMS arrival pt on floor and needed sternal rub but has been alert since, sinus brady (HR 58) on monitor with HTN en route, CBG WNL, pt had vomited and urinated on herself upon EMS arrival, pt was given 252ml of fluids en route, family reports pt fell a few days ago as well but did not seek med attn

## 2018-10-22 NOTE — ED Notes (Signed)
EDP in room  

## 2018-10-22 NOTE — ED Notes (Signed)
XR in room 

## 2018-10-23 ENCOUNTER — Encounter (HOSPITAL_COMMUNITY): Payer: Self-pay | Admitting: *Deleted

## 2018-10-23 ENCOUNTER — Other Ambulatory Visit: Payer: Self-pay

## 2018-10-23 ENCOUNTER — Observation Stay (HOSPITAL_BASED_OUTPATIENT_CLINIC_OR_DEPARTMENT_OTHER): Payer: Medicaid Other

## 2018-10-23 DIAGNOSIS — R110 Nausea: Secondary | ICD-10-CM | POA: Diagnosis not present

## 2018-10-23 DIAGNOSIS — C50812 Malignant neoplasm of overlapping sites of left female breast: Secondary | ICD-10-CM

## 2018-10-23 DIAGNOSIS — R55 Syncope and collapse: Secondary | ICD-10-CM | POA: Diagnosis present

## 2018-10-23 DIAGNOSIS — I10 Essential (primary) hypertension: Secondary | ICD-10-CM

## 2018-10-23 DIAGNOSIS — E876 Hypokalemia: Secondary | ICD-10-CM | POA: Diagnosis present

## 2018-10-23 DIAGNOSIS — Z170 Estrogen receptor positive status [ER+]: Secondary | ICD-10-CM

## 2018-10-23 LAB — MAGNESIUM: Magnesium: 2.1 mg/dL (ref 1.7–2.4)

## 2018-10-23 LAB — RAPID URINE DRUG SCREEN, HOSP PERFORMED
Amphetamines: NOT DETECTED
BARBITURATES: NOT DETECTED
BENZODIAZEPINES: NOT DETECTED
COCAINE: NOT DETECTED
Opiates: NOT DETECTED
TETRAHYDROCANNABINOL: POSITIVE — AB

## 2018-10-23 LAB — ECHOCARDIOGRAM COMPLETE
Height: 64.5 in
Weight: 1824 oz

## 2018-10-23 LAB — CBG MONITORING, ED: Glucose-Capillary: 93 mg/dL (ref 70–99)

## 2018-10-23 LAB — PHOSPHORUS: PHOSPHORUS: 4.3 mg/dL (ref 2.5–4.6)

## 2018-10-23 MED ORDER — HYDRALAZINE HCL 20 MG/ML IJ SOLN: 10.0000 mg | Freq: Three times a day (TID) | INTRAMUSCULAR | Status: DC | PRN

## 2018-10-23 MED ORDER — ENOXAPARIN SODIUM 40 MG/0.4ML ~~LOC~~ SOLN
40.0000 mg | SUBCUTANEOUS | Status: DC
  Administered 2018-10-23 – 2018-10-24 (×2): 40 mg via SUBCUTANEOUS
  Filled 2018-10-23 (×2): qty 0.4

## 2018-10-23 MED ORDER — POTASSIUM CHLORIDE CRYS ER 20 MEQ PO TBCR
20.0000 meq | EXTENDED_RELEASE_TABLET | Freq: Once | ORAL | Status: AC
  Administered 2018-10-23: 20 meq via ORAL
  Filled 2018-10-23: qty 1

## 2018-10-23 MED ORDER — ALBUTEROL SULFATE (2.5 MG/3ML) 0.083% IN NEBU: 3.0000 mL | INHALATION_SOLUTION | Freq: Four times a day (QID) | RESPIRATORY_TRACT | Status: DC | PRN

## 2018-10-23 MED ORDER — SODIUM CHLORIDE 0.9 % IV SOLN
INTRAVENOUS | Status: AC
  Administered 2018-10-23: 16:00:00 via INTRAVENOUS

## 2018-10-23 MED ORDER — POTASSIUM CHLORIDE CRYS ER 20 MEQ PO TBCR
20.0000 meq | EXTENDED_RELEASE_TABLET | Freq: Two times a day (BID) | ORAL | Status: DC
  Administered 2018-10-23 – 2018-10-24 (×3): 20 meq via ORAL
  Filled 2018-10-23 (×2): qty 1

## 2018-10-23 MED ORDER — METOCLOPRAMIDE HCL 10 MG PO TABS
5.0000 mg | ORAL_TABLET | Freq: Three times a day (TID) | ORAL | Status: DC | PRN
  Administered 2018-10-23: 5 mg via ORAL
  Filled 2018-10-23: qty 1

## 2018-10-23 MED ORDER — AMLODIPINE BESYLATE 5 MG PO TABS
10.0000 mg | ORAL_TABLET | Freq: Every day | ORAL | Status: DC
  Filled 2018-10-23: qty 2

## 2018-10-23 MED ORDER — ANASTROZOLE 1 MG PO TABS
1.0000 mg | ORAL_TABLET | Freq: Every day | ORAL | Status: DC
  Administered 2018-10-23 – 2018-10-24 (×2): 1 mg via ORAL
  Filled 2018-10-23 (×3): qty 1

## 2018-10-23 MED ORDER — HYDROCHLOROTHIAZIDE 12.5 MG PO CAPS
12.5000 mg | ORAL_CAPSULE | Freq: Every day | ORAL | Status: DC
  Administered 2018-10-24: 12.5 mg via ORAL
  Filled 2018-10-23 (×3): qty 1

## 2018-10-23 MED ORDER — NITROGLYCERIN 0.4 MG SL SUBL: 0.4000 mg | SUBLINGUAL_TABLET | SUBLINGUAL | Status: DC | PRN

## 2018-10-23 MED ORDER — LISINOPRIL 10 MG PO TABS
20.0000 mg | ORAL_TABLET | Freq: Every day | ORAL | Status: DC
  Administered 2018-10-23 – 2018-10-24 (×2): 20 mg via ORAL
  Filled 2018-10-23 (×2): qty 2

## 2018-10-23 MED ORDER — CLOPIDOGREL BISULFATE 75 MG PO TABS
75.0000 mg | ORAL_TABLET | Freq: Every day | ORAL | Status: DC
  Administered 2018-10-23 – 2018-10-24 (×2): 75 mg via ORAL
  Filled 2018-10-23 (×2): qty 1

## 2018-10-23 MED ORDER — SODIUM CHLORIDE 0.9% FLUSH
3.0000 mL | Freq: Two times a day (BID) | INTRAVENOUS | Status: DC
  Administered 2018-10-23 – 2018-10-24 (×3): 3 mL via INTRAVENOUS

## 2018-10-23 MED ORDER — ASPIRIN EC 81 MG PO TBEC
81.0000 mg | DELAYED_RELEASE_TABLET | Freq: Every day | ORAL | Status: DC
  Administered 2018-10-23 – 2018-10-24 (×2): 81 mg via ORAL
  Filled 2018-10-23 (×2): qty 1

## 2018-10-23 NOTE — Progress Notes (Signed)
Patient's son is concerned about mother's care. Patient's son would like MD to contact her cardiologist Dr. Charolette Forward in [place] to assist in her care as a Optometrist. Patient's son states he has been her cardiologist for over 10 years. Patient's son would also like to be called at [phone] please for a status update.   Thank You   Vista Deck

## 2018-10-23 NOTE — ED Notes (Signed)
Attempted to give report to nurse taking this pt and nurse was unable to take report at this time. Charge nurse on unit was called and I will attempt to give report again as soon as possible.

## 2018-10-23 NOTE — H&P (Signed)
History and Physical    [name]:[id number] DOB: 05-15-1956 DOA: 10/22/2018  PCP: Sela Hilding, MD  Patient coming from: Home  I have personally briefly reviewed patient's old medical records in [place]  Chief Complaint: Syncope  HPI: [name] is a 62 y.o. female with medical history significant of CAD s/p stent, chronic nausea and cannabis use, BRCA believed to be in remission following lumpectomy on anastrozole, HTN.  Patient had 2 syncopal episodes at home today.  EMS called following the 2nd episode.  Noted to have S.Brady by EMS into the low 40s.  Patient tells me she IS still on carvedilol, just doesn't have the medication with her today.  After the syncope she had an episode of vomiting.  It did not occur before.   Chart review shows that she was to have hematology oncology follow-up for the breast cancer sometime this summer but did not make the appointment tried to reschedule it.  Followed by Dr. Terrence Dupont from cardiology standpoint.  And [name] they last saw her in July they did do a CT scan of her abdomen which was negative.  Family practice note states that she has had chronic nausea issues preceding even the diagnosis of breast cancer.  But no mention of any syncope.  Patient with prior history of stroke and has weakness to the right side.   ED Course: HR in the 50s while in ED.  K 3.0, remainder of workup unremarkable.   Review of Systems: Positive for intermittent R 2nd toe pain at the tip for the past 1 week.  As per HPI otherwise 10 point review of systems negative.   Past Medical History:  Diagnosis Date  . Adjustment disorder with depressed mood 01/11/2009  . Arthritis   . Asthma   . Breast cancer ([place])    left breast  . CAD 07/10/2008  . Chronic back pain   . COCAINE DEPENDENCY NOS 01/14/2007   stopped Cocaine 15 years   . CONVULSIONS, SEIZURES, NOS 01/14/2007   last seizure May, 2016  . COPD (chronic  obstructive pulmonary disease) ([place])   . Coronary artery disease 01/14/2012  . CVA 01/14/2007  . Difficulty seeing    patient has glaucoma  . Family history of anesthesia complication 0947   Pts sister (50 years old) died after having anesthesia - induced a heart attcks  . Glaucoma of both eyes   . Headache(784.0)   . HEPATITIS C 02/26/2009  . Hepatitis C   . Hyperlipemia   . HYPERTENSION, BENIGN SYSTEMIC 01/14/2007  . Memory loss    d/t CVA  . MI (myocardial infarction) ([place]) 12/20/2007   had stent placed  . Pneumonia    hx of  . PTSD (post-traumatic stress disorder)   . PTSD (post-traumatic stress disorder)   . Right sided weakness    d/t stroke  . Sciatica   . Stroke [facility])    right side weakness  . TOBACCO DEPENDENCE 01/14/2007    Past Surgical History:  Procedure Laterality Date  . BREAST LUMPECTOMY Left 09/18/2017   Procedure: EVACUATION HEMATOMA  LEFT BREAST;  Surgeon: [name], MD;  Location: [facility];  Service: General;  Laterality: Left;  . CATARACT EXTRACTION W/PHACO Right 07/11/2015   Procedure: CATARACT EXTRACTION PHACO AND INTRAOCULAR LENS PLACEMENT ([place]) RIGHT,INTERNAL PLEB REVISION;  Surgeon: Marylynn Pearson, MD;  Location: [facility];  Service: Ophthalmology;  Laterality: Right;  . CESAREAN SECTION     x 2  .  COLONOSCOPY W/ POLYPECTOMY    . CORONARY ANGIOPLASTY WITH STENT PLACEMENT  2009   1 stent  . EYE SURGERY    . MASTECTOMY W/ SENTINEL NODE BIOPSY Left 09/08/2017   Procedure: LEFT MASTECTOMY WITH SENTINEL LYMPH NODE BIOPSY;  Surgeon: [name], MD;  Location: [facility];  Service: General;  Laterality: Left;  . MITOMYCIN C APPLICATION Right 25/36/6440   Procedure: MITOMYCIN C APPLICATION RIGHT EYE;  Surgeon: Marylynn Pearson, MD;  Location: [facility];  Service: Ophthalmology;  Laterality: Right;  . TONSILLECTOMY    . TRABECULECTOMY Left 06/29/2013   Procedure: TRABECULECTOMY WITH MYTOMICIN C LEFT EYE;  Surgeon: Marylynn Pearson, MD;  Location: [facility];  Service:  Ophthalmology;  Laterality: Left;  . TRABECULECTOMY Right 09/07/2013   Procedure: TRABECULECTOMY RIGHT EYE;  Surgeon: Marylynn Pearson, MD;  Location: [facility];  Service: Ophthalmology;  Laterality: Right;     reports that she has been smoking cigarettes. She has a 11.75 pack-year smoking history. She has never used smokeless tobacco. She reports that she does not drink alcohol or use drugs.  Allergies  Allergen Reactions  . Bee Venom Anaphylaxis  . Other     Patient states she has been told not to take ibuprofen and tylenol    Family History  Problem Relation Age of Onset  . Stroke Mother   . Colon cancer Paternal Uncle   . Heart disease Sister   . Colon cancer Paternal Aunt      Prior to Admission medications   Medication Sig Start Date End Date Taking? Authorizing Provider  albuterol (PROVENTIL HFA;VENTOLIN HFA) 108 (90 BASE) MCG/ACT inhaler Inhale 2 puffs into the lungs every 6 (six) hours as needed for wheezing. For wheezing   Yes [provider]  amLODipine (NORVASC) 5 MG tablet Take 10 mg by mouth daily.    Yes [provider]  anastrozole (ARIMIDEX) 1 MG tablet Take 1 tablet (1 mg total) daily by mouth. 09/28/17  Yes [name], MD  aspirin 81 MG EC tablet Take 81 mg by mouth daily.     Yes [provider]  clopidogrel (PLAVIX) 75 MG tablet Take 75 mg by mouth daily.    Yes [provider]  metoCLOPramide (REGLAN) 5 MG tablet Take 1 tablet (5 mg total) by mouth every 8 (eight) hours as needed for nausea. 07/06/18  Yes Sela Hilding, MD  nitroGLYCERIN (NITROSTAT) 0.4 MG SL tablet Place 0.4 mg under the tongue every 5 (five) minutes as needed for chest pain.    Yes [provider]  potassium chloride SA (K-DUR,KLOR-CON) 20 MEQ tablet Take 1 tablet (20 mEq total) by mouth 2 (two) times daily. 3/47/42  Yes Delora Fuel, MD    Physical Exam: Vitals:   10/23/18 0000 10/23/18 0015 10/23/18 0030 10/23/18 0050  BP: 105/63  121/69     Pulse: (!) 53 (!) 58 (!) 55   Resp:      Temp:    (!) 97.5 F (36.4 C)  TempSrc:    Oral  SpO2: 97% 100% 99%   Weight:      Height:        Constitutional: NAD, calm, comfortable Eyes: PERRL, lids and conjunctivae normal ENMT: Mucous membranes are moist. Posterior pharynx clear of any exudate or lesions.Normal dentition.  Neck: normal, supple, no masses, no thyromegaly Respiratory: clear to auscultation bilaterally, no wheezing, no crackles. Normal respiratory effort. No accessory muscle use.  Cardiovascular: Bradycardic, regular Abdomen: no tenderness, no masses palpated. No hepatosplenomegaly. Bowel sounds  positive.  Musculoskeletal: no clubbing / cyanosis. No joint deformity upper and lower extremities. Good ROM, no contractures. Normal muscle tone.  Skin: no rashes, lesions, ulcers. No induration, no ulcer, wound, skin changes of R 2nd toe specifically. Neurologic: CN 2-12 grossly intact. Sensation intact, DTR normal. Strength 5/5 in all 4.  Psychiatric: Normal judgment and insight. Alert and oriented x 3. Normal mood.    Labs on Admission: I have personally reviewed following labs and imaging studies  CBC: Recent Labs  Lab 10/22/18 2218  WBC 8.9  NEUTROABS 6.1  HGB 10.1*  HCT 31.0*  MCV 88.6  PLT 371   Basic Metabolic Panel: Recent Labs  Lab 10/22/18 2218  NA 141  K 3.0*  CL 107  CO2 28  GLUCOSE 97  BUN 15  CREATININE 1.09*  CALCIUM 8.3*   GFR: Estimated Creatinine Clearance: 42.6 mL/min (A) (by C-G formula based on SCr of 1.09 mg/dL (H)). Liver Function Tests: Recent Labs  Lab 10/22/18 2218  AST 14*  ALT 8  ALKPHOS 39  BILITOT 0.3  PROT 6.4*  ALBUMIN 3.5   Recent Labs  Lab 10/22/18 2218  LIPASE 53*   No results for input(s): AMMONIA in the last 168 hours. Coagulation Profile: Recent Labs  Lab 10/22/18 2218  INR 1.26   Cardiac Enzymes: Recent Labs  Lab 10/22/18 2218  TROPONINI <0.03   BNP (last 3 results) No results for input(s):  PROBNP in the last 8760 hours. HbA1C: No results for input(s): HGBA1C in the last 72 hours. CBG: No results for input(s): GLUCAP in the last 168 hours. Lipid Profile: No results for input(s): CHOL, HDL, LDLCALC, TRIG, CHOLHDL, LDLDIRECT in the last 72 hours. Thyroid Function Tests: No results for input(s): TSH, T4TOTAL, FREET4, T3FREE, THYROIDAB in the last 72 hours. Anemia Panel: No results for input(s): VITAMINB12, FOLATE, FERRITIN, TIBC, IRON, RETICCTPCT in the last 72 hours. Urine analysis:    Component Value Date/Time   COLORURINE YELLOW 05/12/2014 1930   APPEARANCEUR CLEAR 05/12/2014 1930   LABSPEC <1.005 (L) 05/12/2014 1930   PHURINE 7.0 05/12/2014 1930   GLUCOSEU NEGATIVE 05/12/2014 1930   HGBUR NEGATIVE 05/12/2014 1930   BILIRUBINUR NEGATIVE 05/12/2014 1930   KETONESUR NEGATIVE 05/12/2014 1930   PROTEINUR NEGATIVE 05/12/2014 1930   UROBILINOGEN 1.0 05/12/2014 1930   NITRITE NEGATIVE 05/12/2014 1930   LEUKOCYTESUR NEGATIVE 05/12/2014 1930    Radiological Exams on Admission: Ct Head Wo Contrast  Result Date: 10/22/2018 CLINICAL DATA:  Altered level of consciousness. EXAM: CT HEAD WITHOUT CONTRAST TECHNIQUE: Contiguous axial images were obtained from the base of the skull through the vertex without intravenous contrast. COMPARISON:  309 FINDINGS: BRAIN: There is sulcal and ventricular prominence consistent with superficial and central atrophy. No intraparenchymal hemorrhage, mass effect nor midline shift. Moderate degree of periventricular and subcortical white matter hypodensities consistent with chronic small vessel ischemic disease are identified. No acute large vascular territory infarcts. No abnormal extra-axial fluid collections. Basal cisterns are not effaced and midline. VASCULAR: Moderate calcific atherosclerosis of the carotid siphons. SKULL: No skull fracture. No significant scalp soft tissue swelling. SINUSES/ORBITS: The mastoid air-cells are clear. The included  paranasal sinuses are well-aerated.The included ocular globes and orbital contents are non-suspicious. Status post right lens replacement. OTHER: None. IMPRESSION: Moderate small vessel ischemic disease. Cerebral atrophy. No acute intracranial abnormality. Electronically Signed   By: Ashley Royalty M.D.   On: 10/22/2018 22:41   Dg Chest Port 1 View  Result Date: 10/22/2018 CLINICAL DATA:  Syncopal episode.  EXAM: PORTABLE CHEST 1 VIEW COMPARISON:  Chest radiograph March 06, 2017 FINDINGS: Cardiomediastinal silhouette is normal. No pleural effusions or focal consolidations. Trachea projects midline and there is no pneumothorax. Soft tissue planes and included osseous structures are non-suspicious. Old RIGHT rib fractures. IMPRESSION: No active disease. Electronically Signed   By: Elon Alas M.D.   On: 10/22/2018 22:16    EKG: Independently reviewed.  Assessment/Plan Principal Problem:   Syncope Active Problems:   HYPERTENSION, BENIGN SYSTEMIC   Malignant neoplasm of overlapping sites of left breast in female, estrogen receptor positive (HCC)   Chronic nausea   Hypokalemia    1. Syncope - 1. Symptomatic bradycardia suspected, note PCP was concerned that this might become a problem with ongoing wt loss over the summer in their last office visit note. 2. Will stop carvedilol. 3. Tele monitor 4. Syncope pathway 5. 2d echo 6. Check Mg, PO4 2. HTN - 1. Continue amlodipine 2. Continue lisinopril / HCTZ(she takes this once a day despite bottle being BID prescription for lisinopril / HCTZ 20/12.5). 3. Hypokalemia - 1. KDur 46mq once now 2. Continue home dose KDur 20 meq BID 4. Chronic nausea - continue PRN Reglan 5. BRCA - believed to be in remission 1. Continue Anastrazole 2. Patient overdue for oncology follow up 6. H/o CAD - continue ASA / plavix  DVT prophylaxis: Lovenox Code Status: Full Family Communication: Family at bedside Disposition Plan: Home after admit Consults  called: None [place] [facility] Pager [phone] works nights!  If 7AM-7PM, please contact the primary day team physician taking care of patient  [email or web address] Password [email or web address]  10/23/2018, 2:09 AM

## 2018-10-23 NOTE — ED Notes (Signed)
Hosp MD inroom

## 2018-10-23 NOTE — Progress Notes (Signed)
*  PRELIMINARY RESULTS* Echocardiogram 2D Echocardiogram has been performed.  [name] 10/23/2018, 2:06 PM

## 2018-10-23 NOTE — Progress Notes (Signed)
Patient seen and examined.  Stable vital signs (except for sinus bradycardia) and no complaining of acute distress.  Denies chest pain, palpitations, lightheadedness, shortness of breath, fever or any other complaints.  At this moment will complete work-up as dictated by Dr. Alcario Drought for syncope/near syncope in the setting of symptomatic bradycardia.  Will stop calcium channel blockers and beta-blockers.  Replete electrolytes follow 2D echo results.  Will maintain adequate hydration with gentle fluid resuscitation and will repeat orthostatic vital signs in a.m. Please refer to H&P written by Dr. Alcario Drought for further info/details on admission.   Barton Dubois MD [phone]

## 2018-10-23 NOTE — ED Provider Notes (Signed)
[facility] EMERGENCY DEPARTMENT Provider Note   CSN: [id number] Arrival date & time: 10/22/18  2056     History   Chief Complaint Chief Complaint  Patient presents with  . Fall    HPI [name] is a 62 y.o. female.  Patient brought in by EMS for syncopal episode.  Patient's noted to have a sinus bradycardia by EMS with a heart rate in the low 40s.  Upon arrival here heart rate was 46.  Monitor showed sinus bradycardia.  Patient denied being on any beta-blocker medicine.  But she was on it in the past.  Has a history of liver fibrosis chronic hepatitis C.  Has had a history of posttraumatic stress disorder.  History of substance abuse.  History of coronary artery disease with a stent.  And history of breast cancer which is according to the daughter remission.  Daughter states that she had 2 syncopal episodes today.  After the syncope she had an episode of vomiting.  It did not occur before.  Patient's heart rate has gone up into the 50s here consistent with sinus bradycardia.  Patient will answer questions.  She does appear to be somnolent.  Chart review shows that she was to have hematology oncology follow-up for the breast cancer sometime this summer but did not make the appointment tried to reschedule it.  Followed by Dr. Terrence Dupont from cardiology standpoint.  And [name] they last saw her in July they did do a CT scan of her abdomen which was negative.  Family practice note states that she has had chronic nausea issues preceding even the diagnosis of breast cancer.  But no mention of any syncope.  Patient with prior history of stroke and has weakness to the right side.     Past Medical History:  Diagnosis Date  . Adjustment disorder with depressed mood 01/11/2009  . Arthritis   . Asthma   . Breast cancer ([place])    left breast  . CAD 07/10/2008  . Chronic back pain   . COCAINE DEPENDENCY NOS 01/14/2007   stopped Cocaine 15 years   . CONVULSIONS, SEIZURES,  NOS 01/14/2007   last seizure May, 2016  . COPD (chronic obstructive pulmonary disease) ([place])   . Coronary artery disease 01/14/2012  . CVA 01/14/2007  . Difficulty seeing    patient has glaucoma  . Family history of anesthesia complication 4332   Pts sister (29 years old) died after having anesthesia - induced a heart attcks  . Glaucoma of both eyes   . Headache(784.0)   . HEPATITIS C 02/26/2009  . Hepatitis C   . Hyperlipemia   . HYPERTENSION, BENIGN SYSTEMIC 01/14/2007  . Memory loss    d/t CVA  . MI (myocardial infarction) ([place]) 12/20/2007   had stent placed  . Pneumonia    hx of  . PTSD (post-traumatic stress disorder)   . PTSD (post-traumatic stress disorder)   . Right sided weakness    d/t stroke  . Sciatica   . Stroke [facility])    right side weakness  . TOBACCO DEPENDENCE 01/14/2007    Patient Active Problem List   Diagnosis Date Noted  . Chronic nausea 05/31/2018  . Malignant neoplasm of overlapping sites of left breast in female, estrogen receptor positive ([place]) 08/19/2017  . Ductal carcinoma in situ (DCIS) of left breast 08/04/2017  . Liver fibrosis 11/20/2015  . Chronic hepatitis C without hepatic coma ([place]) 07/25/2014  . Hyperkalemia 05/30/2014  . Back  pain 05/30/2014  . Flank pain 12/02/2012  . Hip pain, left 05/27/2012  . Vision changes 04/08/2012  . Healthcare maintenance 12/15/2011  . Hyperlipidemia 12/13/2011  . ADJUSTMENT DISORDER WITH DEPRESSED MOOD 01/11/2009  . MILD COGNITIVE IMPAIRMENT SO STATED 07/10/2008  . Coronary atherosclerosis 07/10/2008  . TOBACCO DEPENDENCE 01/14/2007  . HYPERTENSION, BENIGN SYSTEMIC 01/14/2007  . CVA 01/14/2007  . CONVULSIONS, SEIZURES, NOS 01/14/2007    Past Surgical History:  Procedure Laterality Date  . BREAST LUMPECTOMY Left 09/18/2017   Procedure: EVACUATION HEMATOMA  LEFT BREAST;  Surgeon: [name], MD;  Location: [place];  Service: General;  Laterality: Left;  . CATARACT EXTRACTION W/PHACO Right 07/11/2015    Procedure: CATARACT EXTRACTION PHACO AND INTRAOCULAR LENS PLACEMENT ([place]) RIGHT,INTERNAL PLEB REVISION;  Surgeon: Marylynn Pearson, MD;  Location: [place];  Service: Ophthalmology;  Laterality: Right;  . CESAREAN SECTION     x 2  . COLONOSCOPY W/ POLYPECTOMY    . CORONARY ANGIOPLASTY WITH STENT PLACEMENT  2009   1 stent  . EYE SURGERY    . MASTECTOMY W/ SENTINEL NODE BIOPSY Left 09/08/2017   Procedure: LEFT MASTECTOMY WITH SENTINEL LYMPH NODE BIOPSY;  Surgeon: [name], MD;  Location: [place];  Service: General;  Laterality: Left;  . MITOMYCIN C APPLICATION Right 64/40/3474   Procedure: MITOMYCIN C APPLICATION RIGHT EYE;  Surgeon: Marylynn Pearson, MD;  Location: [place];  Service: Ophthalmology;  Laterality: Right;  . TONSILLECTOMY    . TRABECULECTOMY Left 06/29/2013   Procedure: TRABECULECTOMY WITH MYTOMICIN C LEFT EYE;  Surgeon: Marylynn Pearson, MD;  Location: [place];  Service: Ophthalmology;  Laterality: Left;  . TRABECULECTOMY Right 09/07/2013   Procedure: TRABECULECTOMY RIGHT EYE;  Surgeon: Marylynn Pearson, MD;  Location: [place];  Service: Ophthalmology;  Laterality: Right;     OB History   None      Home Medications    Prior to Admission medications   Medication Sig Start Date End Date Taking? Authorizing Provider  albuterol (PROVENTIL HFA;VENTOLIN HFA) 108 (90 BASE) MCG/ACT inhaler Inhale 2 puffs into the lungs every 6 (six) hours as needed for wheezing. For wheezing   Yes [provider]  amLODipine (NORVASC) 5 MG tablet Take 10 mg by mouth daily.    Yes [provider]  anastrozole (ARIMIDEX) 1 MG tablet Take 1 tablet (1 mg total) daily by mouth. 09/28/17  Yes [name], MD  aspirin 81 MG EC tablet Take 81 mg by mouth daily.     Yes [provider]  carvedilol (COREG) 12.5 MG tablet Take 12.5 mg by mouth 2 (two) times daily with a meal.   Yes [provider]  clopidogrel (PLAVIX) 75 MG tablet Take 75 mg by mouth daily.    Yes [provider]  metoCLOPramide (REGLAN) 5 MG tablet Take 1 tablet (5 mg total) by mouth every 8 (eight) hours as needed for nausea. 07/06/18  Yes Sela Hilding, MD  nitroGLYCERIN (NITROSTAT) 0.4 MG SL tablet Place 0.4 mg under the tongue every 5 (five) minutes as needed for chest pain.    Yes [provider]  potassium chloride SA (K-DUR,KLOR-CON) 20 MEQ tablet Take 1 tablet (20 mEq total) by mouth 2 (two) times daily. 2/59/56  Yes Delora Fuel, MD    Family History Family History  Problem Relation Age of Onset  . Stroke Mother   . Colon cancer Paternal Uncle   . Heart disease Sister   . Colon cancer Paternal Aunt     Social  History Social History   Tobacco Use  . Smoking status: Current Every Day Smoker    Packs/day: 0.25    Years: 47.00    Pack years: 11.75    Types: Cigarettes  . Smokeless tobacco: Never Used  Substance Use Topics  . Alcohol use: No    Comment: quit 4 years ago  . Drug use: No    Comment: last use of cocaine 20 years ago     Allergies   Bee venom and Other   Review of Systems Review of Systems  Constitutional: Negative for fever.  HENT: Negative for congestion.   Eyes: Negative for visual disturbance.  Respiratory: Negative for shortness of breath.   Cardiovascular: Negative for chest pain.  Gastrointestinal: Positive for nausea and vomiting. Negative for abdominal pain.  Genitourinary: Negative for dysuria.  Musculoskeletal: Negative for back pain.  Skin: Negative for rash.  Neurological: Positive for syncope.  Hematological: Does not bruise/bleed easily.  Psychiatric/Behavioral: Negative for confusion.     Physical Exam Updated Vital Signs BP 121/69   Pulse (!) 55   Temp (!) 97.5 F (36.4 C) (Oral)   Resp 18   Ht 1.638 m (5' 4.5")   Wt 51.7 kg   SpO2 99%   BMI 19.27 kg/m   Physical Exam  Constitutional: She appears well-developed and well-nourished. No distress.  HENT:  Head: Normocephalic and atraumatic.  Mouth/Throat:  Oropharynx is clear and moist.  Eyes: Pupils are equal, round, and reactive to light. Conjunctivae and EOM are normal.  Neck: Neck supple.  Cardiovascular: Normal rate, regular rhythm and normal heart sounds.  Sinus bradycardia  Pulmonary/Chest: Effort normal and breath sounds normal. No respiratory distress.  Abdominal: Soft. Bowel sounds are normal. She exhibits no distension. There is no tenderness.  Musculoskeletal: Normal range of motion. She exhibits no edema.  Neurological: She is alert. No cranial nerve deficit or sensory deficit. She exhibits normal muscle tone. Coordination normal.  Pre-existing weakness today right arm and leg leg greater than arm.  Patient states this is from prior stroke.  Skin: Skin is warm.  Nursing note and vitals reviewed.    ED Treatments / Results  Labs (all labs ordered are listed, but only abnormal results are displayed) Labs Reviewed  COMPREHENSIVE METABOLIC PANEL - Abnormal; Notable for the following components:      Result Value   Potassium 3.0 (*)    Creatinine, Ser 1.09 (*)    Calcium 8.3 (*)    Total Protein 6.4 (*)    AST 14 (*)    GFR calc non Af Amer 54 (*)    All other components within normal limits  LIPASE, BLOOD - Abnormal; Notable for the following components:   Lipase 53 (*)    All other components within normal limits  CBC WITH DIFFERENTIAL/PLATELET - Abnormal; Notable for the following components:   RBC 3.50 (*)    Hemoglobin 10.1 (*)    HCT 31.0 (*)    All other components within normal limits  PROTIME-INR - Abnormal; Notable for the following components:   Prothrombin Time 15.6 (*)    All other components within normal limits  ETHANOL  TROPONIN I  RAPID URINE DRUG SCREEN, HOSP PERFORMED    EKG EKG Interpretation  Date/Time:  Friday October 22 2018 21:03:58 EST Ventricular Rate:  45 PR Interval:    QRS Duration: 95 QT Interval:  475 QTC Calculation: 411 R Axis:   71 Text Interpretation:  Sinus bradycardia  Probable left ventricular hypertrophy  No significant change since last tracing Confirmed by Fredia Sorrow [phone]) on 10/22/2018 9:17:30 PM   Radiology Ct Head Wo Contrast  Result Date: 10/22/2018 CLINICAL DATA:  Altered level of consciousness. EXAM: CT HEAD WITHOUT CONTRAST TECHNIQUE: Contiguous axial images were obtained from the base of the skull through the vertex without intravenous contrast. COMPARISON:  309 FINDINGS: BRAIN: There is sulcal and ventricular prominence consistent with superficial and central atrophy. No intraparenchymal hemorrhage, mass effect nor midline shift. Moderate degree of periventricular and subcortical white matter hypodensities consistent with chronic small vessel ischemic disease are identified. No acute large vascular territory infarcts. No abnormal extra-axial fluid collections. Basal cisterns are not effaced and midline. VASCULAR: Moderate calcific atherosclerosis of the carotid siphons. SKULL: No skull fracture. No significant scalp soft tissue swelling. SINUSES/ORBITS: The mastoid air-cells are clear. The included paranasal sinuses are well-aerated.The included ocular globes and orbital contents are non-suspicious. Status post right lens replacement. OTHER: None. IMPRESSION: Moderate small vessel ischemic disease. Cerebral atrophy. No acute intracranial abnormality. Electronically Signed   By: Ashley Royalty M.D.   On: 10/22/2018 22:41   Dg Chest Port 1 View  Result Date: 10/22/2018 CLINICAL DATA:  Syncopal episode. EXAM: PORTABLE CHEST 1 VIEW COMPARISON:  Chest radiograph March 06, 2017 FINDINGS: Cardiomediastinal silhouette is normal. No pleural effusions or focal consolidations. Trachea projects midline and there is no pneumothorax. Soft tissue planes and included osseous structures are non-suspicious. Old RIGHT rib fractures. IMPRESSION: No active disease. Electronically Signed   By: Elon Alas M.D.   On: 10/22/2018 22:16    Procedures Procedures  (including critical care time)  Medications Ordered in ED Medications  0.9 %  sodium chloride infusion ( Intravenous Stopped 10/22/18 2331)  sodium chloride 0.9 % bolus 500 mL (0 mLs Intravenous Stopped 10/22/18 2257)  naloxone [facility]) injection 0.4 mg (0.4 mg Intravenous Given 10/22/18 2127)  ondansetron (ZOFRAN) injection 4 mg (4 mg Intravenous Given 10/22/18 2126)     Initial Impression / Assessment and Plan / ED Course  I have reviewed the triage vital signs and the nursing notes.  Pertinent labs & imaging results that were available during my care of the patient were reviewed by me and considered in my medical decision making (see chart for details).     I feel that patient needs admission for cardiac monitoring for the 2 syncopal episodes.  Patient has a mild hypokalemia.  No other significant electrolyte abnormalities.  CT head was negative chest x-ray without acute findings.  Patient was given some Narcan without any significant changes there.  Urine drug screen is pending.  Alcohol was not elevated.  No evidence of any alcohol in her system.  Discussed with Dr. Alcario Drought the hospitalist he will admit for cardiac monitoring.  Patient was given antinausea medicine here.  No further vomiting.  Patient's liver function test without any significant abnormalities.  Lipase slightly elevated.  No like leukocytosis no anemia.  Head CT does not show any significant evidence of past stroke.  Final Clinical Impressions(s) / ED Diagnoses   Final diagnoses:  Syncope, unspecified syncope type    ED Discharge Orders    None       Fredia Sorrow, MD 10/23/18 0126

## 2018-10-23 NOTE — ED Notes (Signed)
Pt asleep. Family member notified of urine sample need

## 2018-10-23 NOTE — ED Notes (Signed)
[name] daughter

## 2018-10-24 DIAGNOSIS — C50812 Malignant neoplasm of overlapping sites of left female breast: Secondary | ICD-10-CM | POA: Diagnosis not present

## 2018-10-24 DIAGNOSIS — I10 Essential (primary) hypertension: Secondary | ICD-10-CM | POA: Diagnosis not present

## 2018-10-24 DIAGNOSIS — R001 Bradycardia, unspecified: Secondary | ICD-10-CM

## 2018-10-24 DIAGNOSIS — E876 Hypokalemia: Secondary | ICD-10-CM | POA: Diagnosis not present

## 2018-10-24 DIAGNOSIS — R55 Syncope and collapse: Secondary | ICD-10-CM | POA: Diagnosis not present

## 2018-10-24 DIAGNOSIS — F1210 Cannabis abuse, uncomplicated: Secondary | ICD-10-CM

## 2018-10-24 LAB — GLUCOSE, CAPILLARY: Glucose-Capillary: 98 mg/dL (ref 70–99)

## 2018-10-24 LAB — HIV ANTIBODY (ROUTINE TESTING W REFLEX): HIV Screen 4th Generation wRfx: NONREACTIVE

## 2018-10-24 MED ORDER — POTASSIUM CHLORIDE CRYS ER 20 MEQ PO TBCR: 20.0000 meq | EXTENDED_RELEASE_TABLET | Freq: Every day | ORAL | Status: AC

## 2018-10-24 MED ORDER — LISINOPRIL 40 MG PO TABS: 40.0000 mg | ORAL_TABLET | Freq: Every day | ORAL | 1 refills | Status: DC

## 2018-10-24 MED ORDER — AMLODIPINE BESYLATE 5 MG PO TABS: 5.0000 mg | ORAL_TABLET | Freq: Every day | ORAL | Status: DC

## 2018-10-24 NOTE — Progress Notes (Signed)
IV removed, tolerated well. Patient and family at bedside, discharge instructions given.

## 2018-10-24 NOTE — Discharge Summary (Signed)
Physician Discharge Summary  [name] LKG:[id number] DOB: Feb 24, 1956 DOA: 10/22/2018  PCP: Sela Hilding, MD  Admit date: 10/22/2018 Discharge date: 10/24/2018  Time spent: 35 minutes  Recommendations for Outpatient Follow-up:  1. Repeat basic metabolic panel to follow electrolytes and renal function 2. Reassess blood pressure and further adjust antihypertensive regimen as needed 3. Outpatient follow-up with cardiology   Discharge Diagnoses:  Principal Problem:   Syncope Active Problems:   HYPERTENSION, BENIGN SYSTEMIC   Malignant neoplasm of overlapping sites of left breast in female, estrogen receptor positive (HCC)   Chronic nausea   Hypokalemia   Bradycardia   Marijuana abuse   Discharge Condition: Stable and improved.  No lightheadedness or further episode of near syncope/syncope.  Patient denies chest pain and shortness of breath.  Discharged with instructions to follow-up with PCP in 10 days and outpatient follow-up with Dr. Terrence Dupont (patient's cardiologist).  Diet recommendation: Heart healthy/low-sodium diet.  Filed Weights   10/22/18 2106 10/24/18 0445  Weight: 51.7 kg 57 kg    History of present illness:  As per H&P written by Dr. Alcario Drought on 10/23/2018 62 y.o. female with medical history significant of CAD s/p stent, chronic nausea and cannabis use, BRCA believed to be in remission following lumpectomy on anastrozole, HTN.  Patient had 2 syncopal episodes at home today.  EMS called following the 2nd episode.  Noted to have S.Brady by EMS into the low 40s.  Patient tells me she IS still on carvedilol, just doesn't have the medication with her today.  After the syncope she had an episode of vomiting. It did not occur before.  Chart review shows that she was to have hematology oncology follow-up for the breast cancer sometime this summer but did not make the appointment tried to reschedule it. Followed by Dr. Terrence Dupont from cardiology standpoint. And  [name] they last saw her in July they did do a CT scan of her abdomen which was negative. Family practice note states that she has had chronic nausea issues preceding even the diagnosis of breast cancer. But no mention of any syncope. Patient with prior history of stroke and has weakness to the right side.  Hospital Course:  1-syncope -Appears to be associated with orthostatic changes and symptomatic bradycardia -Medications that can affect her heart rate has been discontinue (carvedilol) -Patient Norvasc dose has been cut in half -HCTZ discontinued at discharge -Patient instructed to keep herself well-hydrated and she will follow-up with cardiologist (Dr. Terrence Dupont) for further evaluation and management including decision on event monitoring test.  2-essential hypertension -Blood pressure has remained stable after stopping medication -At discharge she will take half dose of amlodipine will discontinue HCTZ and adjust lisinopril -Patient instructed to follow low-sodium diet.  3-hypokalemia -Most likely associated with poor oral intake and continue use of diuretics -Diuretic has been discontinued -Electrolytes were repleted -Lisinopril dose has been adjusted -Patient will continue taking oral maintenance supplementation.  4-history of chronic nausea -Continue Reglan as previously prescribed -Patient has been instructed to stop the use of marijuana.  5-marijuana abuse -UDS positive -Cessation counseling has been provided  6-history of breast cancer -Appears to be in remission -Continue anastrozole -Continue outpatient follow-up with oncology service.  7-history of coronary artery disease -Negative troponin -No acute ischemic changes on EKG -Reassuring echo results -Continue aspirin and Plavix -Outpatient follow-up with cardiology.  Procedures:  See below for x-ray reports  2D echo: Preserved ejection fraction, no significant valve problems and  no wall motion abnormalities.  Consultations:  None  Discharge Exam: Vitals:   10/24/18 0505 10/24/18 1419  BP: (!) 142/77 (!) 175/77  Pulse: (!) 54 (!) 55  Resp: 18 18  Temp: 98.7 F (37.1 C) 98.9 F (37.2 C)  SpO2: 100% 100%    General: Afebrile, no chest pain, no nausea, no vomiting, denies palpitations or feeling of skipping beats.  Patient is no lightheaded and is asking to be discharged home. Cardiovascular: Regular rate, sinus rhythm, no rubs, no gallops, no JVD. Respiratory: Scattered rhonchi, no wheezing, no crackles, speaking in full sentences.  No using accessory muscles. Abdomen: Soft, nontender, distended, positive bowel sounds Extremities: No edema, no cyanosis, no clubbing.  Discharge Instructions   Discharge Instructions    Diet - low sodium heart healthy   Complete by:  As directed    Discharge instructions   Complete by:  As directed    Medications as prescribed Arrange follow-up with PCP in 10 days Follow-up with Dr. Terrence Dupont for further evaluation and adjustment in your chronic medications. Maintain adequate hydration and follow heart healthy diet.     Allergies as of 10/24/2018      Reactions   Bee Venom Anaphylaxis   Other    Patient states she has been told not to take ibuprofen and tylenol      Medication List    TAKE these medications   albuterol 108 (90 Base) MCG/ACT inhaler Commonly known as:  PROVENTIL HFA;VENTOLIN HFA Inhale 2 puffs into the lungs every 6 (six) hours as needed for wheezing. For wheezing   amLODipine 5 MG tablet Commonly known as:  NORVASC Take 1 tablet (5 mg total) by mouth daily. What changed:  how much to take   anastrozole 1 MG tablet Commonly known as:  ARIMIDEX Take 1 tablet (1 mg total) daily by mouth.   aspirin 81 MG EC tablet Take 81 mg by mouth daily.   clopidogrel 75 MG tablet Commonly known as:  PLAVIX Take 75 mg by mouth daily.   lisinopril 40 MG tablet Commonly known as:   PRINIVIL,ZESTRIL Take 1 tablet (40 mg total) by mouth daily. Start taking on:  10/25/2018   metoCLOPramide 5 MG tablet Commonly known as:  REGLAN Take 1 tablet (5 mg total) by mouth every 8 (eight) hours as needed for nausea.   nitroGLYCERIN 0.4 MG SL tablet Commonly known as:  NITROSTAT Place 0.4 mg under the tongue every 5 (five) minutes as needed for chest pain.   potassium chloride SA 20 MEQ tablet Commonly known as:  K-DUR,KLOR-CON Take 1 tablet (20 mEq total) by mouth daily. What changed:  when to take this      Allergies  Allergen Reactions  . Bee Venom Anaphylaxis  . Other     Patient states she has been told not to take ibuprofen and tylenol   Follow-up Information    Sela Hilding, MD. Schedule an appointment as soon as possible for a visit in 10 day(s).   Specialty:  Family Medicine Contact information: [place] [address] [phone]        Charolette Forward, MD. Schedule an appointment as soon as possible for a visit in 2 week(s).   Specialty:  Cardiology Contact information: [place] [address] [phone]           The results of significant diagnostics from this hospitalization (including imaging, microbiology, ancillary and laboratory) are listed below for reference.    Significant Diagnostic Studies: Ct Head  Wo Contrast  Result Date: 10/22/2018 CLINICAL DATA:  Altered level of consciousness. EXAM: CT HEAD WITHOUT CONTRAST TECHNIQUE: Contiguous axial images were obtained from the base of the skull through the vertex without intravenous contrast. COMPARISON:  309 FINDINGS: BRAIN: There is sulcal and ventricular prominence consistent with superficial and central atrophy. No intraparenchymal hemorrhage, mass effect nor midline shift. Moderate degree of periventricular and subcortical white matter hypodensities consistent with chronic small vessel ischemic disease are identified. No acute large  vascular territory infarcts. No abnormal extra-axial fluid collections. Basal cisterns are not effaced and midline. VASCULAR: Moderate calcific atherosclerosis of the carotid siphons. SKULL: No skull fracture. No significant scalp soft tissue swelling. SINUSES/ORBITS: The mastoid air-cells are clear. The included paranasal sinuses are well-aerated.The included ocular globes and orbital contents are non-suspicious. Status post right lens replacement. OTHER: None. IMPRESSION: Moderate small vessel ischemic disease. Cerebral atrophy. No acute intracranial abnormality. Electronically Signed   By: Ashley Royalty M.D.   On: 10/22/2018 22:41   Dg Chest Port 1 View  Result Date: 10/22/2018 CLINICAL DATA:  Syncopal episode. EXAM: PORTABLE CHEST 1 VIEW COMPARISON:  Chest radiograph March 06, 2017 FINDINGS: Cardiomediastinal silhouette is normal. No pleural effusions or focal consolidations. Trachea projects midline and there is no pneumothorax. Soft tissue planes and included osseous structures are non-suspicious. Old RIGHT rib fractures. IMPRESSION: No active disease. Electronically Signed   By: Elon Alas M.D.   On: 10/22/2018 22:16   Labs: Basic Metabolic Panel: Recent Labs  Lab 10/22/18 2218  NA 141  K 3.0*  CL 107  CO2 28  GLUCOSE 97  BUN 15  CREATININE 1.09*  CALCIUM 8.3*  MG 2.1  PHOS 4.3   Liver Function Tests: Recent Labs  Lab 10/22/18 2218  AST 14*  ALT 8  ALKPHOS 39  BILITOT 0.3  PROT 6.4*  ALBUMIN 3.5   Recent Labs  Lab 10/22/18 2218  LIPASE 53*   CBC: Recent Labs  Lab 10/22/18 2218  WBC 8.9  NEUTROABS 6.1  HGB 10.1*  HCT 31.0*  MCV 88.6  PLT 206   Cardiac Enzymes: Recent Labs  Lab 10/22/18 2218  TROPONINI <0.03   CBG: Recent Labs  Lab 10/23/18 0555 10/24/18 0503  GLUCAP 93 98    Signed:  Barton Dubois MD.  [facility] 10/24/2018, 3:57 PM

## 2018-10-26 ENCOUNTER — Telehealth: Payer: Self-pay | Admitting: Family Medicine

## 2018-10-26 NOTE — Telephone Encounter (Deleted)
Pt has appointment scheduled for this on 11/03/18 with Dr. Andria Frames. Katharina Caper, April D, [place]

## 2018-10-26 NOTE — Telephone Encounter (Signed)
Appt made for 12/18 with Dr. Andria Frames. Dr. Lindell Noe is not available until 12/08/2018. Ottis Stain, CMA

## 2018-10-26 NOTE — Telephone Encounter (Signed)
White team, patient was hospitalized recently at [facility]. Can someone call and help her schedule a hospital follow up appointment with me or white team?

## 2018-11-03 ENCOUNTER — Ambulatory Visit: Payer: Medicaid Other | Admitting: Family Medicine

## 2018-11-11 ENCOUNTER — Ambulatory Visit: Payer: Medicaid Other | Admitting: Family Medicine

## 2018-11-19 ENCOUNTER — Ambulatory Visit: Payer: Medicaid Other | Admitting: Family Medicine

## 2018-11-26 ENCOUNTER — Ambulatory Visit: Payer: Medicaid Other | Admitting: Family Medicine

## 2018-12-07 ENCOUNTER — Telehealth: Payer: Self-pay | Admitting: Hematology and Oncology

## 2018-12-07 ENCOUNTER — Other Ambulatory Visit: Payer: Self-pay | Admitting: Hematology and Oncology

## 2018-12-07 NOTE — Telephone Encounter (Signed)
Scheduled appt per 1/21 sch message - left message for patient with appt date and time and sent reminder letter in the mail.

## 2018-12-08 ENCOUNTER — Telehealth: Payer: Self-pay | Admitting: Family Medicine

## 2018-12-08 NOTE — Telephone Encounter (Signed)
Called patient to invite her to [facility] clinic. She has had several no shows recently unfortunately. Seems like there was a question of transportation recently - if she calls back please offer her my 3pm or 4pm slot on 1/27 - Janett Billow can access these slots. Please also ask her if she needs transportation help and ask Hilda Blades or Tomasa Hosteller for a voucher if needed.

## 2018-12-15 ENCOUNTER — Other Ambulatory Visit: Payer: Self-pay

## 2018-12-15 MED ORDER — ANASTROZOLE 1 MG PO TABS: 1.0000 mg | ORAL_TABLET | Freq: Every day | ORAL | 0 refills | Status: DC

## 2018-12-16 ENCOUNTER — Encounter: Payer: Self-pay | Admitting: Hematology and Oncology

## 2018-12-16 ENCOUNTER — Telehealth: Payer: Self-pay | Admitting: Hematology and Oncology

## 2018-12-16 NOTE — Telephone Encounter (Signed)
Spoke with patient and got her set up with the transportation program. Gave her my contact information if she has any questions. She is currently set up with a ride on 2/7.

## 2018-12-16 NOTE — Telephone Encounter (Signed)
[facility] PAL - moved 2/3 f/u to 2/7. Spoke with patient. Patient expressed concern about appointment changing due to she has to get transportation and given them 5-7 days notice. Patient also expressed concern about missing appointments due to offices will not call transportion for her. Per patient the way things are being done is changing and they won't take your word for it anymore.   Transporation assessment completed. Patient aware she will receive a call. Patient given appointment for 2/7 @ 10:15 am.

## 2018-12-17 ENCOUNTER — Inpatient Hospital Stay: Payer: Medicaid Other | Admitting: Hematology and Oncology

## 2018-12-23 NOTE — Progress Notes (Signed)
Patient Care Team: Morgan Hilding, MD as PCP - General (Family Medicine) Morgan Lose, MD as Consulting Physician (Hematology and Oncology) [name], Morgan Massed, NP as Nurse Practitioner (Hematology and Oncology) Morgan Pray, MD as Consulting Physician (Radiation Oncology) Morgan Keens, MD as Consulting Physician (General Surgery)  DIAGNOSIS:    ICD-10-CM   1. Malignant neoplasm of overlapping sites of left breast in female, estrogen receptor positive ([place]) C50.812    Z17.0     SUMMARY OF ONCOLOGIC HISTORY:   Ductal carcinoma in situ (DCIS) of left breast   08/13/2017 Initial Diagnosis    Large left breast mass involving all quadrants measuring 7.5 cm with enlarged axillary lymph node, biopsy revealed high-grade  DCIS ER 40% PR 20%,, lymph node biopsy negative, Tis N0 stage 0    09/08/2017 Surgery    Left mastectomy: IDC grade 2, 0.6 cm, extensive high-grade DCIS with necrosis, margins negative, 1/8 lymph nodes with micrometastases, T1bN1Mic (Stage 1A)     Malignant neoplasm of overlapping sites of left breast in female, estrogen receptor positive ([place])   08/19/2017 Initial Diagnosis    Malignant neoplasm of overlapping sites of left breast in female, estrogen receptor positive ([place])    09/08/2017 Surgery    Left mastectomy: IDC grade 2, 0.6 cm, extensive DCIS, high-grade with necrosis, margins negative, 1/8 lymph node with micrometastatic disease, T1BN1 mic stage I a, ER 100%, PR 100%, Ki-67 70%, HER-2 negative ratio 1.23.    09/2017 -  Anti-estrogen oral therapy    Anastrozole 17m daily     CHIEF COMPLIANT: Follow-up of anastrozole therapy  INTERVAL HISTORY: [name] a 63y.o. with above-mentioned history of left breast cancer treated with left mastectomy and is currently on anti-estrogen therapy. The patient was last see by Morgan Bihari NP one year ago. Her most recent ECHO from 10/23/18 showed an ejection fraction in the range of 60-65%. She  presents to the clinic today alone. She reports she doesn't have a nurse aid anymore and has had trouble making it to her appointments. She reports she ran out of anastrozole in November and was only recently able to refill it, missing about 2 months. She reports hot flashes that wake her up and nausea. She has poor eyesight and uses a cane to get around, and doesn't have many friends or family members helping her out. She reviewed her medication with me.   REVIEW OF SYSTEMS:   Constitutional: Denies fevers, chills or abnormal weight loss (+) hot flashes Eyes: Denies blurriness of vision Ears, nose, mouth, throat, and face: Denies mucositis or sore throat Respiratory: Denies cough, dyspnea or wheezes Cardiovascular: Denies palpitation, chest discomfort Gastrointestinal: Denies heartburn or change in bowel habits (+) nausea Skin: Denies abnormal skin rashes Lymphatics: Denies new lymphadenopathy or easy bruising Neurological: Denies numbness, tingling or new weaknesses Behavioral/Psych: Mood is stable, no new changes  Extremities: No lower extremity edema Breast: denies any pain or lumps or nodules in either breasts All other systems were reviewed with the patient and are negative.  I have reviewed the past medical history, past surgical history, social history and family history with the patient and they are unchanged from previous note.  ALLERGIES:  is allergic to bee venom and other.  MEDICATIONS:  Current Outpatient Medications  Medication Sig Dispense Refill  . albuterol (PROVENTIL HFA;VENTOLIN HFA) 108 (90 BASE) MCG/ACT inhaler Inhale 2 puffs into the lungs every 6 (six) hours as needed for wheezing. For wheezing    . amLODipine (NORVASC)  5 MG tablet Take 1 tablet (5 mg total) by mouth daily.    [name] anastrozole (ARIMIDEX) 1 MG tablet Take 1 tablet (1 mg total) by mouth daily. 90 tablet 3  . aspirin 81 MG EC tablet Take 81 mg by mouth daily.      . clopidogrel (PLAVIX) 75 MG tablet Take  75 mg by mouth daily.     [name] lisinopril (PRINIVIL,ZESTRIL) 40 MG tablet Take 1 tablet (40 mg total) by mouth daily. 30 tablet 1  . metoCLOPramide (REGLAN) 5 MG tablet Take 1 tablet (5 mg total) by mouth every 8 (eight) hours as needed for nausea. 30 tablet 3  . nitroGLYCERIN (NITROSTAT) 0.4 MG SL tablet Place 0.4 mg under the tongue every 5 (five) minutes as needed for chest pain.     . potassium chloride SA (K-DUR,KLOR-CON) 20 MEQ tablet Take 1 tablet (20 mEq total) by mouth daily.     No current facility-administered medications for this visit.    Facility-Administered Medications Ordered in Other Visits  Medication Dose Route Frequency Provider Last Rate Last Dose  . mitoMYcin (MUTAMYCIN) Injection Use in OR only (0.4 mg/ml)  0.5 mL Right Eye Once Marylynn Pearson, MD        PHYSICAL EXAMINATION: ECOG PERFORMANCE STATUS: 1 - Symptomatic but completely ambulatory  Vitals:   12/24/18 1013  BP: (!) 153/78  Pulse: 71  Resp: 16  Temp: 98.3 F (36.8 C)  SpO2: 100%   Filed Weights   12/24/18 1013  Weight: 121 lb 4.8 oz (55 kg)    GENERAL: alert, no distress and comfortable SKIN: skin color, texture, turgor are normal, no rashes or significant lesions EYES: normal, Conjunctiva are pink and non-injected, sclera clear OROPHARYNX: no exudate, no erythema and lips, buccal mucosa, and tongue normal  NECK: supple, thyroid normal size, non-tender, without nodularity LYMPH: no palpable lymphadenopathy in the cervical, axillary or inguinal LUNGS: clear to auscultation and percussion with normal breathing effort HEART: regular rate & rhythm and no murmurs and no lower extremity edema ABDOMEN: abdomen soft, non-tender and normal bowel sounds MUSCULOSKELETAL: no cyanosis of digits and no clubbing  NEURO: alert & oriented x 3 with fluent speech, no focal motor/sensory deficits EXTREMITIES: No lower extremity edema BREAST: Left mastectomy.  Right breast no palpable lumps or nodules. (exam  performed in the presence of a chaperone)  LABORATORY DATA:  I have reviewed the data as listed CMP Latest Ref Rng & Units 10/22/2018 05/31/2018 09/18/2017  Glucose 70 - 99 mg/dL 97 84 94  BUN 8 - 23 mg/dL _0 Creatinine 0.44 - 1.00 mg/dL 1.09(H) 1.00 0.77  Sodium 135 - 145 mmol/L 141 141 136  Potassium 3.5 - 5.1 mmol/L 3.0(L) 4.3 5.8(H)  Chloride 98 - 111 mmol/L 107 100 105  CO2 22 - 32 mmol/L 28 25 21(L)  Calcium 8.9 - 10.3 mg/dL 8.3(L) 10.1 9.3  Total Protein 6.5 - 8.1 g/dL 6.4(L) 7.2 -  Total Bilirubin 0.3 - 1.2 mg/dL 0.3 0.3 -  Alkaline Phos 38 - 126 U/L 39 48 -  AST 15 - 41 U/L 14(L) 14 -  ALT 0 - 44 U/L 8 7 -    Lab Results  Component Value Date   WBC 8.9 10/22/2018   HGB 10.1 (L) 10/22/2018   HCT 31.0 (L) 10/22/2018   MCV 88.6 10/22/2018   PLT 206 10/22/2018   NEUTROABS 6.1 10/22/2018    ASSESSMENT & PLAN:  Malignant neoplasm of overlapping sites of left breast  in female, estrogen receptor positive ([place]) 08/13/2017: Mammogram and ultrasound:Large left breast mass involving all quadrants measuring 7.5 cm with enlarged axillary lymph node, biopsy revealed high-grade  DCIS ER 40% PR 20%,, lymph node biopsy negative, Tis N0 stage 0  09/08/2017: Left mastectomy: IDC grade 2, 0.6 cm, extensive DCIS, high-grade with necrosis, margins negative, 1/8 lymph node with micrometastatic disease, T1BN1 mic stage I a, ER 100%, PR 100%, Ki-67 70%, HER-2 negative ratio 1.23. Did not receive radiation because of comorbidities  Current treatment: antiestrogen therapy with anastrozole5 years started 09/28/2017 Patient history of  Stroke and coronary artery disease   Anastrozole toxicities: Denies any hot flashes or myalgias.  However because of lack of transportation she was not able to pick up her medication until recently.  Breast cancer surveillance: 1.  Breast exam 12/24/2018: Benign 2.  Right breast mammogram will need to be performed.  We will reach out for social work to  arrange for transportation to get her mammogram done on the right breast.  Return to clinic in 1 year for follow-up    No orders of the defined types were placed in this encounter.  The patient has a good understanding of the overall plan. she agrees with it. she will call with any problems that may develop before the next visit here.  Morgan Lose, MD 12/24/2018  Julious Oka Dorshimer am acting as scribe for Dr. Nicholas [name].  I have reviewed the above documentation for accuracy and completeness, and I agree with the above.

## 2018-12-24 ENCOUNTER — Inpatient Hospital Stay: Payer: Medicaid Other | Attending: Hematology and Oncology | Admitting: Hematology and Oncology

## 2018-12-24 ENCOUNTER — Telehealth: Payer: Self-pay | Admitting: Hematology and Oncology

## 2018-12-24 DIAGNOSIS — Z7982 Long term (current) use of aspirin: Secondary | ICD-10-CM

## 2018-12-24 DIAGNOSIS — Z79899 Other long term (current) drug therapy: Secondary | ICD-10-CM

## 2018-12-24 DIAGNOSIS — C773 Secondary and unspecified malignant neoplasm of axilla and upper limb lymph nodes: Secondary | ICD-10-CM | POA: Diagnosis not present

## 2018-12-24 DIAGNOSIS — Z170 Estrogen receptor positive status [ER+]: Secondary | ICD-10-CM

## 2018-12-24 DIAGNOSIS — R110 Nausea: Secondary | ICD-10-CM

## 2018-12-24 DIAGNOSIS — Z79811 Long term (current) use of aromatase inhibitors: Secondary | ICD-10-CM | POA: Diagnosis not present

## 2018-12-24 DIAGNOSIS — C50812 Malignant neoplasm of overlapping sites of left female breast: Secondary | ICD-10-CM | POA: Diagnosis not present

## 2018-12-24 DIAGNOSIS — R232 Flushing: Secondary | ICD-10-CM

## 2018-12-24 DIAGNOSIS — Z9012 Acquired absence of left breast and nipple: Secondary | ICD-10-CM

## 2018-12-24 DIAGNOSIS — Z8673 Personal history of transient ischemic attack (TIA), and cerebral infarction without residual deficits: Secondary | ICD-10-CM

## 2018-12-24 DIAGNOSIS — I2510 Atherosclerotic heart disease of native coronary artery without angina pectoris: Secondary | ICD-10-CM | POA: Diagnosis not present

## 2018-12-24 MED ORDER — ANASTROZOLE 1 MG PO TABS: 1.0000 mg | ORAL_TABLET | Freq: Every day | ORAL | 3 refills | Status: DC

## 2018-12-24 MED ORDER — METOCLOPRAMIDE HCL 5 MG PO TABS: 5.0000 mg | ORAL_TABLET | Freq: Three times a day (TID) | ORAL | 3 refills | Status: DC | PRN

## 2018-12-24 NOTE — Telephone Encounter (Signed)
Gave avs and calendar ° °

## 2018-12-24 NOTE — Assessment & Plan Note (Addendum)
08/13/2017: Mammogram and ultrasound:Large left breast mass involving all quadrants measuring 7.5 cm with enlarged axillary lymph node, biopsy revealed high-grade  DCIS ER 40% PR 20%,, lymph node biopsy negative, Tis N0 stage 0  09/08/2017: Left mastectomy: IDC grade 2, 0.6 cm, extensive DCIS, high-grade with necrosis, margins negative, 1/8 lymph node with micrometastatic disease, T1BN1 mic stage I a, ER 100%, PR 100%, Ki-67 70%, HER-2 negative ratio 1.23. Did not receive radiation because of comorbidities  Current treatment: antiestrogen therapy with anastrozole5 years started 09/28/2017 Patient history of  Stroke and coronary artery disease   Anastrozole toxicities: Denies any hot flashes or myalgias.  However because of lack of transportation she was not able to pick up her medication until recently.  Breast cancer surveillance: 1.  Breast exam 12/24/2018: Benign 2.  Right breast mammogram will need to be performed.  We will reach out for social work to arrange for transportation to get her mammogram done on the right breast.  Return to clinic in 1 year for follow-up

## 2018-12-27 ENCOUNTER — Telehealth: Payer: Self-pay

## 2018-12-27 NOTE — Telephone Encounter (Signed)
Nurse inquired about transportation for future mammogram appointments with social workers.  Per social workers - pt notifies Medicaid transportation of appointment and they will accommodate.   Nurse left voicemail for patient to call back regarding transportation update.

## 2019-01-28 ENCOUNTER — Other Ambulatory Visit: Payer: Self-pay | Admitting: Cardiology

## 2019-01-28 ENCOUNTER — Other Ambulatory Visit (HOSPITAL_COMMUNITY): Payer: Self-pay | Admitting: Cardiology

## 2019-01-28 DIAGNOSIS — R079 Chest pain, unspecified: Secondary | ICD-10-CM

## 2019-02-09 ENCOUNTER — Encounter (HOSPITAL_COMMUNITY): Payer: Medicaid Other

## 2019-03-09 ENCOUNTER — Ambulatory Visit (HOSPITAL_COMMUNITY): Payer: Medicaid Other

## 2019-04-18 ENCOUNTER — Ambulatory Visit (HOSPITAL_COMMUNITY): Payer: Medicaid Other

## 2019-04-25 ENCOUNTER — Other Ambulatory Visit: Payer: Self-pay

## 2019-04-25 ENCOUNTER — Ambulatory Visit (HOSPITAL_COMMUNITY)
Admission: RE | Admit: 2019-04-25 | Discharge: 2019-04-25 | Disposition: A | Discharge status: Home / Self Care | Payer: Medicaid Other | Source: Ambulatory Visit | Attending: Cardiology | Admitting: Cardiology

## 2019-04-25 DIAGNOSIS — R079 Chest pain, unspecified: Secondary | ICD-10-CM

## 2019-04-25 MED ORDER — REGADENOSON 0.4 MG/5ML IV SOLN
0.4000 mg | Freq: Once | INTRAVENOUS | Status: AC
  Administered 2019-04-25: 0.4 mg via INTRAVENOUS

## 2019-04-25 MED ORDER — TECHNETIUM TC 99M TETROFOSMIN IV KIT
30.0000 | PACK | Freq: Once | INTRAVENOUS | Status: AC | PRN
  Administered 2019-04-25: 11:00:00 30 via INTRAVENOUS

## 2019-04-25 MED ORDER — REGADENOSON 0.4 MG/5ML IV SOLN
INTRAVENOUS | Status: AC
  Filled 2019-04-25: qty 5

## 2019-04-25 MED ORDER — TECHNETIUM TC 99M TETROFOSMIN IV KIT
10.0000 | PACK | Freq: Once | INTRAVENOUS | Status: AC | PRN
  Administered 2019-04-25: 10 via INTRAVENOUS

## 2019-12-14 ENCOUNTER — Ambulatory Visit (INDEPENDENT_AMBULATORY_CARE_PROVIDER_SITE_OTHER): Payer: Medicare Other | Admitting: Family Medicine

## 2019-12-14 ENCOUNTER — Encounter: Payer: Self-pay | Admitting: Family Medicine

## 2019-12-14 ENCOUNTER — Other Ambulatory Visit: Payer: Self-pay

## 2019-12-14 VITALS — BP 148/84 | HR 98 | Wt 109.4 lb

## 2019-12-14 DIAGNOSIS — R110 Nausea: Secondary | ICD-10-CM | POA: Diagnosis not present

## 2019-12-14 DIAGNOSIS — Z0000 Encounter for general adult medical examination without abnormal findings: Secondary | ICD-10-CM | POA: Diagnosis not present

## 2019-12-14 DIAGNOSIS — D0512 Intraductal carcinoma in situ of left breast: Secondary | ICD-10-CM

## 2019-12-14 MED ORDER — ONDANSETRON HCL 4 MG PO TABS: 4.0000 mg | ORAL_TABLET | Freq: Three times a day (TID) | ORAL | 0 refills | Status: DC | PRN

## 2019-12-14 NOTE — Patient Instructions (Signed)
It was a pleasure to see you today! Thank you for choosing [facility] for your primary care. [name] was seen for today we talked about your nausea. Come back to the clinic if there is anything that we can do for you.  Today we prescribe some medicine to hopefully help with your nausea, we discussed that it is very very important that you are able to increase her calorie intake because you keep losing weight.  We are going to withhold drawing labs because you are going to see the cancer doctor next week but for some reason if that appointment falls through please call us so that we can start a follow-up ourselves.  We are going to have our social worker try to work on helping arrange a ride for you to get your mammogram, there is very important you get this done.    Please bring all your medications to every doctors visit   Sign up for My Chart to have easy access to your labs results, and communication with your Primary care physician.     Please check-out at the front desk before leaving the clinic.     Best,  Dr. [name] FAMILY MEDICINE RESIDENT - PGY3 12/14/2019 3:00 PM

## 2019-12-14 NOTE — Progress Notes (Signed)
    Subjective:  [name] is a 64 y.o. female who presents to the [facility] today with a chief complaint of weight loss and nausea.   HPI: Patient with chronic nausea and loss of appetite over the last few years, also has history of malignant breast cancer followed up by oncology.  She was unable to navigate the assistance programs required to get the follow-up mammogram that oncology had ordered in March but still does want to get this.  She has not been throwing up but her nausea make sure not want to eat very much.  She is concerned she is losing weight and chart review confirms that she is now down to 110 pounds from 140 few years ago.  She is denies any structural problems with eating or new change in pain  Objective:  Physical Exam: BP (!) 148/84   Pulse 98   Wt 109 lb 6.4 oz (49.6 kg)   SpO2 99%   BMI 18.49 kg/m   [id number] but conversing comfortably, not in distress, very thin CV: RRR with no murmurs appreciated Pulm: NWOB, CTAB with no crackles, wheezes, or rhonchi MSK: no edema, cyanosis, or clubbing noted Skin: warm, dry Neuro: grossly normal, moves all extremities Psych: Normal affect and thought content  No results found for this or any previous visit (from the past 72 hour(s)).   Assessment/Plan:  Ductal carcinoma in situ (DCIS) of left breast Patient with diagnosis of malignant breast cancer, was told over a year ago to get follow-up mammogram but patient did not have transportation and had troubles navigating the assistance available to her to get this done.  Is scheduled to see oncology follow-up in the next few weeks, will refer to social work chronic care management to attempt to get this mammogram performed before she sees the oncologist.  Chronic nausea Nausea is impacting patient's diet, she has lost over 30 pounds in the last year and a half and is concerned that she will "waste away ".  We discussed risks and benefits of starting Zofran and given that she is  chronically losing weight we will try this as it attempt to make her feel comfortable enough to eat an appropriate amount of food to gain her weight.  We also discussed that with a prior diagnosis of cancer there is concern that there could be a cancer etiology to this on our part and that it is very important that she follows up with her oncology doctor as well as gets the mammogram that we are having social work try to help her arrange a ride for.  Patient understands   [name], DO FAMILY MEDICINE RESIDENT - PGY3 12/15/2019 6:45 PM

## 2019-12-15 NOTE — Assessment & Plan Note (Signed)
Patient with diagnosis of malignant breast cancer, was told over a year ago to get follow-up mammogram but patient did not have transportation and had troubles navigating the assistance available to her to get this done.  Is scheduled to see oncology follow-up in the next few weeks, will refer to social work chronic care management to attempt to get this mammogram performed before she sees the oncologist.

## 2019-12-15 NOTE — Assessment & Plan Note (Signed)
Nausea is impacting patient's diet, she has lost over 30 pounds in the last year and a half and is concerned that she will "waste away ".  We discussed risks and benefits of starting Zofran and given that she is chronically losing weight we will try this as it attempt to make her feel comfortable enough to eat an appropriate amount of food to gain her weight.  We also discussed that with a prior diagnosis of cancer there is concern that there could be a cancer etiology to this on our part and that it is very important that she follows up with her oncology doctor as well as gets the mammogram that we are having social work try to help her arrange a ride for.  Patient understands

## 2019-12-16 ENCOUNTER — Ambulatory Visit: Payer: Medicare Other | Admitting: Licensed Clinical Social Worker

## 2019-12-16 DIAGNOSIS — I10 Essential (primary) hypertension: Secondary | ICD-10-CM

## 2019-12-16 DIAGNOSIS — Z789 Other specified health status: Secondary | ICD-10-CM

## 2019-12-16 DIAGNOSIS — D0512 Intraductal carcinoma in situ of left breast: Secondary | ICD-10-CM

## 2019-12-16 DIAGNOSIS — R531 Weakness: Secondary | ICD-10-CM

## 2019-12-16 DIAGNOSIS — B182 Chronic viral hepatitis C: Secondary | ICD-10-CM

## 2019-12-16 NOTE — Addendum Note (Signed)
Addended by: Coral Else on: 12/16/2019 10:37 AM   Modules accepted: Orders

## 2019-12-16 NOTE — Chronic Care Management (AMB) (Signed)
Social Work Care Management  Initial Visit  12/16/2019 Name: [name] MRN: [id number] DOB: 1956-09-17  Assessment: [name] is a 64 y.o. year old female who sees [name], DO for primary care. The care management team was consulted for assistance with care management and care coordination needs related to transportation concerns. Patient reports no concerns with transportation see care plan below with details. Patient is requesting DME (tub/ shower seat and toilet seat for commode)  SDOH (Social Determinants of Health) screening performed today: . See Care Plan for related entries addressed during this encounter.  No other needs identified at this time.  Review of patient status, including review of consultants reports, relevant laboratory and other test results, and collaboration with appropriate care team members and the patient's provider was performed as part of comprehensive patient evaluation and provision of care management services.  Patient agreed to services provided today and verbal consent was obtained.  Outpatient Encounter Medications as of 12/16/2019  Medication Sig  . albuterol (PROVENTIL HFA;VENTOLIN HFA) 108 (90 BASE) MCG/ACT inhaler Inhale 2 puffs into the lungs every 6 (six) hours as needed for wheezing. For wheezing  . amLODipine (NORVASC) 5 MG tablet Take 1 tablet (5 mg total) by mouth daily.  [name] anastrozole (ARIMIDEX) 1 MG tablet Take 1 tablet (1 mg total) by mouth daily.  [name] aspirin 81 MG EC tablet Take 81 mg by mouth daily.    . clopidogrel (PLAVIX) 75 MG tablet Take 75 mg by mouth daily.   [name] lisinopril (PRINIVIL,ZESTRIL) 40 MG tablet Take 1 tablet (40 mg total) by mouth daily.  . metoCLOPramide (REGLAN) 5 MG tablet Take 1 tablet (5 mg total) by mouth every 8 (eight) hours as needed for nausea.  . nitroGLYCERIN (NITROSTAT) 0.4 MG SL tablet Place 0.4 mg under the tongue every 5 (five) minutes as needed for chest pain.   [name] ondansetron (ZOFRAN) 4 MG tablet Take 1  tablet (4 mg total) by mouth every 8 (eight) hours as needed for nausea or vomiting.  . potassium chloride SA (K-DUR,KLOR-CON) 20 MEQ tablet Take 1 tablet (20 mEq total) by mouth daily.   Facility-Administered Encounter Medications as of 12/16/2019  Medication  . mitoMYcin (MUTAMYCIN) Injection Use in OR only (0.4 mg/ml)   Goals Addressed            This Visit's Progress   . need shower and toilet seat for Commode       Current Barriers:  . Chronic Disease Management support, education, and care coordination needs related to HTN and chronic Hep C. . Unable to take a bath and use toilet due to difficulty getting up without the assistance of something to hold on to   Clinical Goal(s)  Over the next 30 days,  . LCSW will inform PCP of patient concerns and request for DME . LCSW will follow up with patient to make sure she received equipment    Interventions  . Assessed care coordination needs . Collaborated with appropriate clinical care team members regarding patient needs  Patient Self Care Activities  Patient is unable to independently obtain DME with coordination for CCM with PCP  Initial goal documentation     . COMPLETED: Transportation   On track    Current Barriers:  . [employer] support, education, and care coordination needs related to Transportation in a patient with HTN and chronic hep C   . Difficulty with vision to read mail which caused a laps in transportation service . Patient  has difficulty with transportation services  reports concerns have been resolved now has transportation with "[employer]".  Also states sister will take her to all medical appointments and help arrange and call [employer] 3 days before appointment if needed  Clinical Goal(s): No goals as transportation concerns have been resolved per patient  Interventions provided by LCSW:  . Assessed patient's care coordination needs, what she has done in the past for transportation, barriers to  getting to appointments, and concerns with previous and current transportation.  Patient Self Care Activities & Deficits:  . Patient is unable to independently navigate transportation without assistance of her sister . Acknowledges deficits related to transportation and is motivated to resolve concern  . Patient is able to contact her sister for all transportation needs as discussed today  Initial goal documentation      Follow up plan: No ongoing CCM needs identified for patient. 1. PCP notified of DME request  2.  LCSW will call patient as a check in to make sure she received DME after PCP places the order   [name], [place] [facility] [place]   [phone] 10:14 AM

## 2019-12-16 NOTE — Progress Notes (Signed)
Ordered dme shower stool and walker to help patient with ability to bathe and use toilet, mammo ordered in conjunction with prior oncology note.   Please help patient schedule mammo as they have had trouble navigating this in the past.  -Dr. Criss Rosales

## 2019-12-16 NOTE — Patient Instructions (Signed)
Licensed Clinical Social Worker Visit Information Ms. Cerar  it was nice speaking with you. Please call me directly if you have questions [phone] Goals we discussed today:  Goals Addressed            This Visit's Progress   . need shower and toilet seat for commode       Current Barriers:  . Chronic Disease Management support, education, and care coordination needs related to HTN and chronic Hep C. . Unable to take a bath and use toilet due to difficulty getting up without the assistance of something to hold on to   Clinical Goal(s)  Over the next 30 days,  . LCSW will inform PCP of patient concerns and request for DME . LCSW will follow up with patient to make sure she received equipment    Interventions  . Assessed care coordination needs . Collaborated with appropriate clinical care team members regarding patient needs  Patient Self Care Activities  Patient is unable to independently obtain DME with coordination for CCM with PCP  Initial goal documentation     . COMPLETED: Transportation   On track    Current Barriers:  . [employer] support, education, and care coordination needs related to Transportation in a patient with HTN and chronic hep C   . Difficulty with vision to read mail which caused a laps in transportation service . Patient has difficulty with transportation services  reports concerns have been resolved now has transportation with "[employer]".  Also states sister will take her to all medical appointments and help arrange and call [employer] 3 days before appointment if needed  Clinical Goal(s): No goals as transportation concerns have been resolved per patient  Interventions provided by LCSW:  . Assessed patient's care coordination needs, what she has done in the past for transportation, barriers to getting to appointments, and concerns with previous and current transportation.  Patient Self Care Activities & Deficits:  . Patient is unable to  independently navigate transportation without assistance of her sister . Acknowledges deficits related to transportation and is motivated to resolve concern  . Patient is able to contact her sister for all transportation needs as discussed today  Initial goal documentation          Materials provided: Ms. Scicchitano was given information about Care Management services today including:  1. Care Management services include personalized support from designated clinical staff supervised by her physician, including individualized plan of care and coordination with other care providers 2. 24/7 contact [phone] for assistance for urgent and routine care needs. 3. Care Management services at any time by phone call to the office staff.  The patient verbalized understanding of instructions provided today and declined a print copy of patient instruction materials.  Follow up plan:  SW will follow up with patient by phone over the next 2 to 3 weeks  Maurine Cane, LCSW

## 2019-12-19 ENCOUNTER — Telehealth: Payer: Self-pay

## 2019-12-19 NOTE — Telephone Encounter (Signed)
Community message sent to [employer] for patients shower stool and rolling walker. Will await response.   Talbot Grumbling, RN

## 2019-12-23 ENCOUNTER — Ambulatory Visit: Payer: Self-pay | Admitting: Licensed Clinical Social Worker

## 2019-12-23 NOTE — Chronic Care Management (AMB) (Signed)
   Social Work  Care Management Collaboration 12/23/2019 Name: [name] MRN: [id number] DOB: 02/18/1956  [name] is a 64 y.o. year old female who sees [name], DO for primary care. LCSW was consulted by PCP to assistance patient with transportation to [facility]. Provider also asking LCSW to coordinate setting up the mammo appointment.  Patient was not interviewed or contacted during this encounter however LCSW reviewed chart and notes.  Patient denies needing help with transportation, states this has been resolved,  She is waiting for PCP to set up appointment for mammo.  Recommendation:   PCP will need to [facility].  The [facility] will reach out to patient and schedule the appointment.   Intervention: Collaborated with care team to meet patient's needs.  Review of patient status, including review of consultants reports, relevant laboratory and other test results, and collaboration with appropriate care team members and the patient's provider was performed as part of comprehensive patient evaluation and provision of chronic care management services.    Plan: LCSW will follow up with patient in 1 to 2 weeks after order is placed.   [name], LCSW Clinical Social Worker [place] / [place]   [phone] 2:31 PM

## 2019-12-23 NOTE — Progress Notes (Signed)
[name],   It appears her [name] is already scheduled for 2/19.  Do I need to order another one?  -Dr. Criss Rosales

## 2019-12-26 ENCOUNTER — Inpatient Hospital Stay: Payer: Medicare Other | Attending: Hematology and Oncology | Admitting: Hematology and Oncology

## 2019-12-26 ENCOUNTER — Other Ambulatory Visit: Payer: Self-pay

## 2019-12-26 ENCOUNTER — Inpatient Hospital Stay: Payer: Medicare Other

## 2019-12-26 ENCOUNTER — Other Ambulatory Visit: Payer: Self-pay | Admitting: Hematology and Oncology

## 2019-12-26 VITALS — BP 179/90 | HR 73 | Temp 98.7°F | Resp 18 | Ht 64.5 in | Wt 127.8 lb

## 2019-12-26 DIAGNOSIS — R630 Anorexia: Secondary | ICD-10-CM | POA: Insufficient documentation

## 2019-12-26 DIAGNOSIS — Z8673 Personal history of transient ischemic attack (TIA), and cerebral infarction without residual deficits: Secondary | ICD-10-CM | POA: Diagnosis not present

## 2019-12-26 DIAGNOSIS — Z170 Estrogen receptor positive status [ER+]: Secondary | ICD-10-CM | POA: Insufficient documentation

## 2019-12-26 DIAGNOSIS — I2510 Atherosclerotic heart disease of native coronary artery without angina pectoris: Secondary | ICD-10-CM | POA: Insufficient documentation

## 2019-12-26 DIAGNOSIS — Z9012 Acquired absence of left breast and nipple: Secondary | ICD-10-CM | POA: Diagnosis not present

## 2019-12-26 DIAGNOSIS — R5383 Other fatigue: Secondary | ICD-10-CM | POA: Insufficient documentation

## 2019-12-26 DIAGNOSIS — D0512 Intraductal carcinoma in situ of left breast: Secondary | ICD-10-CM | POA: Diagnosis not present

## 2019-12-26 DIAGNOSIS — Z7982 Long term (current) use of aspirin: Secondary | ICD-10-CM | POA: Insufficient documentation

## 2019-12-26 DIAGNOSIS — C50812 Malignant neoplasm of overlapping sites of left female breast: Secondary | ICD-10-CM | POA: Diagnosis not present

## 2019-12-26 DIAGNOSIS — Z79899 Other long term (current) drug therapy: Secondary | ICD-10-CM | POA: Insufficient documentation

## 2019-12-26 DIAGNOSIS — Z79811 Long term (current) use of aromatase inhibitors: Secondary | ICD-10-CM | POA: Diagnosis not present

## 2019-12-26 DIAGNOSIS — Z7902 Long term (current) use of antithrombotics/antiplatelets: Secondary | ICD-10-CM | POA: Insufficient documentation

## 2019-12-26 LAB — CBC WITH DIFFERENTIAL (CANCER CENTER ONLY)
Abs Immature Granulocytes: 0.01 10*3/uL (ref 0.00–0.07)
Basophils Absolute: 0 10*3/uL (ref 0.0–0.1)
Basophils Relative: 0 %
Eosinophils Absolute: 0.1 10*3/uL (ref 0.0–0.5)
Eosinophils Relative: 1 %
HCT: 32.7 % — ABNORMAL LOW (ref 36.0–46.0)
Hemoglobin: 11.1 g/dL — ABNORMAL LOW (ref 12.0–15.0)
Immature Granulocytes: 0 %
Lymphocytes Relative: 36 %
Lymphs Abs: 3 10*3/uL (ref 0.7–4.0)
MCH: 29.4 pg (ref 26.0–34.0)
MCHC: 33.9 g/dL (ref 30.0–36.0)
MCV: 86.5 fL (ref 80.0–100.0)
Monocytes Absolute: 0.7 10*3/uL (ref 0.1–1.0)
Monocytes Relative: 8 %
Neutro Abs: 4.4 10*3/uL (ref 1.7–7.7)
Neutrophils Relative %: 55 %
Platelet Count: 276 10*3/uL (ref 150–400)
RBC: 3.78 MIL/uL — ABNORMAL LOW (ref 3.87–5.11)
RDW: 13.3 % (ref 11.5–15.5)
WBC Count: 8.2 10*3/uL (ref 4.0–10.5)
nRBC: 0 % (ref 0.0–0.2)

## 2019-12-26 LAB — CMP (CANCER CENTER ONLY)
ALT: 9 U/L (ref 0–44)
AST: 16 U/L (ref 15–41)
Albumin: 3.7 g/dL (ref 3.5–5.0)
Alkaline Phosphatase: 51 U/L (ref 38–126)
Anion gap: 6 (ref 5–15)
BUN: 9 mg/dL (ref 8–23)
CO2: 28 mmol/L (ref 22–32)
Calcium: 9.1 mg/dL (ref 8.9–10.3)
Chloride: 105 mmol/L (ref 98–111)
Creatinine: 0.87 mg/dL (ref 0.44–1.00)
GFR, Est AFR Am: >60 mL/min (ref >60)
GFR, Estimated: >60 mL/min (ref >60)
Glucose, Bld: 89 mg/dL (ref 70–99)
Potassium: 4 mmol/L (ref 3.5–5.1)
Sodium: 139 mmol/L (ref 135–145)
Total Bilirubin: 0.2 mg/dL — ABNORMAL LOW (ref 0.3–1.2)
Total Protein: 7.1 g/dL (ref 6.5–8.1)

## 2019-12-26 MED ORDER — CARVEDILOL 6.25 MG PO TABS: 6.2500 mg | ORAL_TABLET | Freq: Two times a day (BID) | ORAL | Status: DC

## 2019-12-26 MED ORDER — ATORVASTATIN CALCIUM 20 MG PO TABS: 20.0000 mg | ORAL_TABLET | Freq: Every day | ORAL | Status: DC

## 2019-12-26 NOTE — Assessment & Plan Note (Signed)
08/13/2017: Mammogram and ultrasound:Large left breast mass involving all quadrants measuring 7.5 cm with enlarged axillary lymph node, biopsy revealed high-grade DCIS ER 40% PR 20%,, lymph node biopsy negative, Tis N0 stage 0  09/08/2017:Left mastectomy: IDC grade 2, 0.6 cm, extensive DCIS, high-grade with necrosis, margins negative, 1/8 lymph node with micrometastatic disease, T1BN1 mic stage I a, ER 100%, PR 100%, Ki-67 70%, HER-2 negative ratio 1.23. Did not receive radiation because of comorbidities  Current treatment: antiestrogen therapy with anastrozole5 years started 09/28/2017 Patient history of Stroke and coronary artery disease   Anastrozole toxicities: Denies any hot flashes or myalgias.  However because of lack of transportation she was not able to pick up her medication until recently.  Breast cancer surveillance: 1.  Breast exam 12/26/2019: Benign 2.  Right breast mammogram scheduled for 01/06/2020  Return to clinic in 1 year for follow-up

## 2019-12-26 NOTE — Progress Notes (Signed)
Patient Care Team: Morgan Sires, DO as PCP - General (Family Medicine) Morgan Lose, MD as Consulting Physician (Hematology and Oncology) [name], Morgan Massed, NP as Nurse Practitioner (Hematology and Oncology) Morgan Pray, MD as Consulting Physician (Radiation Oncology) Morgan Keens, MD as Consulting Physician (General Surgery)  DIAGNOSIS:    ICD-10-CM   1. Fatigue, unspecified type  R53.83 CBC with Differential ([place])    CMP ([place] only)    Thyroid Panel With TSH  2. Ductal carcinoma in situ (DCIS) of left breast  D05.12 CT Chest W Contrast    CT Abdomen Pelvis W Contrast    CBC with Differential ([place])    CMP ([place] only)    SUMMARY OF ONCOLOGIC HISTORY: Oncology History  Ductal carcinoma in situ (DCIS) of left breast  08/13/2017 Initial Diagnosis   Large left breast mass involving all quadrants measuring 7.5 cm with enlarged axillary lymph node, biopsy revealed high-grade  DCIS ER 40% PR 20%,, lymph node biopsy negative, Tis N0 stage 0   09/08/2017 Surgery   Left mastectomy: IDC grade 2, 0.6 cm, extensive high-grade DCIS with necrosis, margins negative, 1/8 lymph nodes with micrometastases, T1bN1Mic (Stage 1A)   Malignant neoplasm of overlapping sites of left breast in female, estrogen receptor positive ([place])  08/19/2017 Initial Diagnosis   Malignant neoplasm of overlapping sites of left breast in female, estrogen receptor positive ([place])   09/08/2017 Surgery   Left mastectomy: IDC grade 2, 0.6 cm, extensive DCIS, high-grade with necrosis, margins negative, 1/8 lymph node with micrometastatic disease, T1BN1 mic stage I a, ER 100%, PR 100%, Ki-67 70%, HER-2 negative ratio 1.23.   09/2017 -  Anti-estrogen oral therapy   Anastrozole 64m daily     CHIEF COMPLIANT: Follow-up of left breast cancer on anastrozole therapy  INTERVAL HISTORY: Morgan TUOHYis a 64y.o. with above-mentioned history of left breast cancer treated  with left mastectomy and is currently on anti-estrogen therapy with anastrozole. She presents to the clinic today for annual follow-up.  She tells me that she hurts all over and has poor appetite. Her legs are swollen. Shes sees Dr.Harwani with cardiology Shes also C/O nausea  ALLERGIES:  is allergic to bee venom and other.  MEDICATIONS:  Current Outpatient Medications  Medication Sig Dispense Refill  . albuterol (PROVENTIL HFA;VENTOLIN HFA) 108 (90 BASE) MCG/ACT inhaler Inhale 2 puffs into the lungs every 6 (six) hours as needed for wheezing. For wheezing    . amLODipine (NORVASC) 5 MG tablet Take 1 tablet (5 mg total) by mouth daily.    .Marland Kitchenanastrozole (ARIMIDEX) 1 MG tablet Take 1 tablet (1 mg total) by mouth daily. 90 tablet 3  . aspirin 81 MG EC tablet Take 81 mg by mouth daily.      .Marland Kitchenatorvastatin (LIPITOR) 20 MG tablet Take 1 tablet (20 mg total) by mouth daily.    . carvedilol (COREG) 6.25 MG tablet Take 1 tablet (6.25 mg total) by mouth 2 (two) times daily with a meal.    . clopidogrel (PLAVIX) 75 MG tablet Take 75 mg by mouth daily.     .Marland Kitchenlisinopril (PRINIVIL,ZESTRIL) 40 MG tablet Take 1 tablet (40 mg total) by mouth daily. 30 tablet 1  . metoCLOPramide (REGLAN) 5 MG tablet Take 1 tablet (5 mg total) by mouth every 8 (eight) hours as needed for nausea. 30 tablet 3  . nitroGLYCERIN (NITROSTAT) 0.4 MG SL tablet Place 0.4 mg under the tongue every 5 (five) minutes  as needed for chest pain.     [name] ondansetron (ZOFRAN) 4 MG tablet Take 1 tablet (4 mg total) by mouth every 8 (eight) hours as needed for nausea or vomiting. 20 tablet 0  . potassium chloride SA (K-DUR,KLOR-CON) 20 MEQ tablet Take 1 tablet (20 mEq total) by mouth daily.     No current facility-administered medications for this visit.   Facility-Administered Medications Ordered in Other Visits  Medication Dose Route Frequency Provider Last Rate Last Admin  . mitoMYcin (MUTAMYCIN) Injection Use in OR only (0.4 mg/ml)  0.5 mL  Right Eye Once Marylynn Pearson, MD        PHYSICAL EXAMINATION: ECOG PERFORMANCE STATUS: 1 - Symptomatic but completely ambulatory  Vitals:   12/26/19 1511  BP: (!) 179/90  Pulse: 73  Resp: 18  Temp: 98.7 F (37.1 C)  SpO2: 100%   Filed Weights   12/26/19 1511  Weight: 127 lb 12.8 oz (58 kg)      LABORATORY DATA:  I have reviewed the data as listed CMP Latest Ref Rng & Units 10/22/2018 05/31/2018 09/18/2017  Glucose 70 - 99 mg/dL 97 84 94  BUN 8 - 23 mg/dL '15 13 10  ' Creatinine 0.44 - 1.00 mg/dL 1.09(H) 1.00 0.77  Sodium 135 - 145 mmol/L 141 141 136  Potassium 3.5 - 5.1 mmol/L 3.0(L) 4.3 5.8(H)  Chloride 98 - 111 mmol/L 107 100 105  CO2 22 - 32 mmol/L 28 25 21(L)  Calcium 8.9 - 10.3 mg/dL 8.3(L) 10.1 9.3  Total Protein 6.5 - 8.1 g/dL 6.4(L) 7.2 -  Total Bilirubin 0.3 - 1.2 mg/dL 0.3 0.3 -  Alkaline Phos 38 - 126 U/L 39 48 -  AST 15 - 41 U/L 14(L) 14 -  ALT 0 - 44 U/L 8 7 -    Lab Results  Component Value Date   WBC 8.9 10/22/2018   HGB 10.1 (L) 10/22/2018   HCT 31.0 (L) 10/22/2018   MCV 88.6 10/22/2018   PLT 206 10/22/2018   NEUTROABS 6.1 10/22/2018    ASSESSMENT & PLAN:  Ductal carcinoma in situ (DCIS) of left breast 08/13/2017: Mammogram and ultrasound:Large left breast mass involving all quadrants measuring 7.5 cm with enlarged axillary lymph node, biopsy revealed high-grade DCIS ER 40% PR 20%,, lymph node biopsy negative, Tis N0 stage 0  09/08/2017:Left mastectomy: IDC grade 2, 0.6 cm, extensive DCIS, high-grade with necrosis, margins negative, 1/8 lymph node with micrometastatic disease, T1BN1 mic stage I a, ER 100%, PR 100%, Ki-67 70%, HER-2 negative ratio 1.23. Did not receive radiation because of comorbidities  Current treatment: antiestrogen therapy with anastrozole5 years started 09/28/2017 Patient history of Stroke and coronary artery disease   Anastrozole toxicities: Denies any hot flashes or myalgias.  Her brother has been feeding her and  helping with transportation. Diffuse aches and fatigue: Could be cardiac but wll rule out malignancy because she has abdominal pain. We will get CT CAP I will see her after that and discuss  Breast cancer surveillance: 1.  Breast exam 12/26/2019: Benign 2.  Right breast mammogram scheduled for 01/06/2020  Return to clinic after scans Labs today    Orders Placed This Encounter  Procedures  . CT Chest W Contrast    Standing Status:   Future    Standing Expiration Date:   12/25/2020    Order Specific Question:   ** REASON FOR EXAM (FREE TEXT)    Answer:   Diffuse body pains    Order Specific Question:   If  indicated for the ordered procedure, I authorize the administration of contrast media per Radiology protocol    Answer:   Yes    Order Specific Question:   Preferred imaging location?    Answer:   [facility]    Order Specific Question:   Radiology Contrast Protocol - do NOT remove file path    Answer:   \\[email or web address]  . CT Abdomen Pelvis W Contrast    Standing Status:   Future    Standing Expiration Date:   12/25/2020    Order Specific Question:   ** REASON FOR EXAM (FREE TEXT)    Answer:   Body pains with breast cancer    Order Specific Question:   If indicated for the ordered procedure, I authorize the administration of contrast media per Radiology protocol    Answer:   Yes    Order Specific Question:   Preferred imaging location?    Answer:   [facility]    Order Specific Question:   Is Oral Contrast requested for this exam?    Answer:   Yes, Per Radiology protocol    Order Specific Question:   Radiology Contrast Protocol - do NOT remove file path    Answer:   \\[email or web address].pdf  . CBC with Differential ([place] Only)    Standing Status:   Future    Standing Expiration Date:   12/25/2020  . CMP ([place] only)    Standing Status:   Future    Standing Expiration Date:   12/25/2020  . Thyroid  Panel With TSH    Standing Status:   Future    Standing Expiration Date:   12/25/2020   The patient has a good understanding of the overall plan. she agrees with it. she will call with any problems that may develop before the next visit here.  Total time spent: 20 mins including face to face time and time spent for planning, charting and coordination of care  Morgan Lose, MD 12/26/2019  I, Cloyde Reams Dorshimer, am acting as scribe for Dr. Nicholas [name].  I have reviewed the above documentation for accuracy and completeness, and I agree with the above.

## 2019-12-27 ENCOUNTER — Telehealth: Payer: Self-pay | Admitting: Hematology and Oncology

## 2019-12-27 LAB — THYROID PANEL WITH TSH
Free Thyroxine Index: 1.9 (ref 1.2–4.9)
T3 Uptake Ratio: 29 % (ref 24–39)
T4, Total: 6.5 ug/dL (ref 4.5–12.0)
TSH: 3.29 u[IU]/mL (ref 0.450–4.500)

## 2019-12-27 NOTE — Telephone Encounter (Signed)
I talk with patient regarding 2/16 

## 2019-12-28 NOTE — Telephone Encounter (Signed)
[name] with [facility] processing request for patient.   Talbot Grumbling, RN

## 2019-12-30 ENCOUNTER — Ambulatory Visit (HOSPITAL_COMMUNITY): Payer: Medicare Other
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
## 2020-01-03 ENCOUNTER — Inpatient Hospital Stay (HOSPITAL_BASED_OUTPATIENT_CLINIC_OR_DEPARTMENT_OTHER): Payer: Medicare Other | Admitting: Hematology and Oncology

## 2020-01-03 ENCOUNTER — Other Ambulatory Visit: Payer: Self-pay

## 2020-01-03 DIAGNOSIS — Z170 Estrogen receptor positive status [ER+]: Secondary | ICD-10-CM | POA: Diagnosis not present

## 2020-01-03 DIAGNOSIS — C50812 Malignant neoplasm of overlapping sites of left female breast: Secondary | ICD-10-CM | POA: Diagnosis not present

## 2020-01-03 NOTE — Assessment & Plan Note (Signed)
08/13/2017: Mammogram and ultrasound:Large left breast mass involving all quadrants measuring 7.5 cm with enlarged axillary lymph node, biopsy revealed high-grade DCIS ER 40% PR 20%,, lymph node biopsy negative, Tis N0 stage 0  09/08/2017:Left mastectomy: IDC grade 2, 0.6 cm, extensive DCIS, high-grade with necrosis, margins negative, 1/8 lymph node with micrometastatic disease, T1BN1 mic stage I a, ER 100%, PR 100%, Ki-67 70%, HER-2 negative ratio 1.23. Did not receive radiation because of comorbidities  Current treatment: antiestrogen therapy with anastrozole5 yearsstarted 09/28/2017 Patient history of Stroke and coronary artery disease  Anastrozole toxicities:Denies any hot flashes or myalgias.  Her brother has been feeding her and helping with transportation. Diffuse aches and fatigue: Could be cardiac but wll rule out malignancy because she has abdominal pain. We will get CT CAP I will see her after that and discuss  Breast cancer surveillance: 1.Breast exam 12/26/2019: Benign 2.Right breast mammogram scheduled for 01/06/2020  Return to clinic after scans

## 2020-01-04 ENCOUNTER — Telehealth: Payer: Self-pay

## 2020-01-04 ENCOUNTER — Ambulatory Visit: Payer: Medicare Other | Admitting: Licensed Clinical Social Worker

## 2020-01-04 DIAGNOSIS — I6789 Other cerebrovascular disease: Secondary | ICD-10-CM

## 2020-01-04 DIAGNOSIS — Z7189 Other specified counseling: Secondary | ICD-10-CM

## 2020-01-04 MED ORDER — COMFORT SHIELD ADULT DIAPERS MISC: 1.0000 | 1 refills | Status: AC | PRN

## 2020-01-04 NOTE — Chronic Care Management (AMB) (Signed)
Social Work Care Management  Follow Up   01/04/2020 Name: AZARRIAH VERDEROSA MRN: [id number] DOB: 04/17/1956  Referred by: [name], DO      Reason for referral : No chief complaint on file.    JACOLE KINDERKNECHT is a 64 y.o. year old female who is a primary care patient of [name], DO.  Reason for follow-up: Phone encounter with patient today for ongoing assessment and brief interventions to assist with care coordination needs.   Assessment: Patient states [facility] [employer] will transport her to [place] appointment 01/06/20.  Her aide continues to come daily and assist with ADL's.  Intervention: SDOH (Social Determinants of Health) screening and general assessment of needs completed today.  See Care Plan for related entries.  Advanced Directives: not addressed during this encounter.  Review of patient status, including review of consultants reports, relevant laboratory and other test results, and collaboration with appropriate care team members and the patient's provider was performed as part of comprehensive patient evaluation and provision of chronic care management services.   Outpatient Encounter Medications as of 01/04/2020  Medication Sig  . albuterol (PROVENTIL HFA;VENTOLIN HFA) 108 (90 BASE) MCG/ACT inhaler Inhale 2 puffs into the lungs every 6 (six) hours as needed for wheezing. For wheezing  . amLODipine (NORVASC) 5 MG tablet Take 1 tablet (5 mg total) by mouth daily.  [name] anastrozole (ARIMIDEX) 1 MG tablet Take 1 tablet (1 mg total) by mouth daily.  [name] aspirin 81 MG EC tablet Take 81 mg by mouth daily.    [name] atorvastatin (LIPITOR) 20 MG tablet Take 1 tablet (20 mg total) by mouth daily.  . carvedilol (COREG) 6.25 MG tablet Take 1 tablet (6.25 mg total) by mouth 2 (two) times daily with a meal.  . clopidogrel (PLAVIX) 75 MG tablet Take 75 mg by mouth daily.   [name] lisinopril (PRINIVIL,ZESTRIL) 40 MG tablet Take 1 tablet (40 mg total) by mouth daily.  . metoCLOPramide (REGLAN) 5 MG  tablet Take 1 tablet (5 mg total) by mouth every 8 (eight) hours as needed for nausea.  . nitroGLYCERIN (NITROSTAT) 0.4 MG SL tablet Place 0.4 mg under the tongue every 5 (five) minutes as needed for chest pain.   [name] ondansetron (ZOFRAN) 4 MG tablet Take 1 tablet (4 mg total) by mouth every 8 (eight) hours as needed for nausea or vomiting.  . potassium chloride SA (K-DUR,KLOR-CON) 20 MEQ tablet Take 1 tablet (20 mEq total) by mouth daily.   Facility-Administered Encounter Medications as of 01/04/2020  Medication  . mitoMYcin (MUTAMYCIN) Injection Use in OR only (0.4 mg/ml)   Goals Addressed            This Visit's Progress   . I Need Depends       Current Barriers:  . Patient with HTN and chronic Hep C. needs incontinent supplies  . Unable to get to bathroom and wetting self several times a day . Acknowledges deficits and needs support, education and care coordination in order to meet this unmet need  Clinical Goal(s):  Over the next 30 days, patient will receive assistance with obtaining incontinent supplies  Interventions: . Assessed patient needs, what she has used in the past and how she is currently meeting need . Collaborate with PCP regarding patient needs and request  . Collaborate with RN pool as needed regarding patient needs Patient Self Care Activities: . Patient is unable to independently navigate getting incontinent supplies without care coordination support  Initial goal documentation      .  need shower and toilet seat for commode   On track    Current Barriers and progress:  . Chronic Disease Management support, education, and care coordination needs related to HTN and chronic Hep C. . Unable to take a bath and use toilet due to difficulty getting up without the assistance of something to hold on to  . Patient reports shower bench was delivered yesterday still waiting on seat for commode Clinical Goal(s)  Over the next 30 days,  . LCSW will inform PCP of patient  concerns and request for DME . LCSW will follow up with patient to make sure she received equipment   Interventions  . Assessed care coordination needs and barriers  . Collaborated with appropriate clinical care team members regarding patient needs Patient Self Care Activities  Patient is unable to independently obtain DME with coordination for CCM with PCP Please see past updates related to this goal by clicking on the "Past Updates" button in the selected goal       Plan:  1. LCSW will inform PCP of patient's request for incontinent supplies ( size small) 2. LCSW will follow up with patient by phone in two weeks  [name], [place] / [place]   [phone] 9:27 AM   Phone follow

## 2020-01-04 NOTE — Patient Instructions (Signed)
Licensed Clinical Social Worker Visit Information Ms. Goldwasser  it was nice speaking with you. Please call me directly if you have questions [phone] Goals we discussed today:  Goals Addressed            This Visit's Progress   . I Need Depends       Current Barriers:  . Patient with HTN and chronic Hep C. needs incontinent supplies  . Unable to get to bathroom and wetting self several times a day . Acknowledges deficits and needs support, education and care coordination in order to meet this unmet need  Clinical Goal(s):  Over the next 30 days, patient will receive assistance with obtaining incontinent supplies  Interventions: . Assessed patient needs, what she has used in the past and how she is currently meeting need . Collaborate with PCP regarding patient needs and request  . Collaborate with RN pool as needed regarding patient needs Patient Self Care Activities: . Patient is unable to independently navigate getting incontinent supplies without care coordination support  Initial goal documentation      . need shower and toilet seat for commode   On track    Current Barriers and progress:  . Chronic Disease Management support, education, and care coordination needs related to HTN and chronic Hep C. . Unable to take a bath and use toilet due to difficulty getting up without the assistance of something to hold on to  . Patient reports shower bench was delivered yesterday still waiting on seat for commode Clinical Goal(s)  Over the next 30 days,  . LCSW will inform PCP of patient concerns and request for DME . LCSW will follow up with patient to make sure she received equipment   Interventions  . Assessed care coordination needs and barriers  . Collaborated with appropriate clinical care team members regarding patient needs Patient Self Care Activities  Patient is unable to independently obtain DME with coordination for CCM with PCP Please see past updates related to this goal  by clicking on the "Past Updates" button in the selected goal      Follow up plan: 2 week phone follow up  Maurine Cane, LCSW

## 2020-01-04 NOTE — Addendum Note (Signed)
Addended by: Coral Else on: 01/04/2020 01:31 PM   Modules accepted: Orders

## 2020-01-04 NOTE — Progress Notes (Signed)
Adult diapers for incontinence, please process DME request  -Dr. Criss Rosales

## 2020-01-04 NOTE — Telephone Encounter (Signed)
Called patient to ask about size for adult diapers.   Filled out necessary paperwork and faxed to [facility]. Copy placed in batch scanning.   Talbot Grumbling, RN

## 2020-01-09 ENCOUNTER — Telehealth: Payer: Self-pay | Admitting: *Deleted

## 2020-01-09 NOTE — Telephone Encounter (Signed)
RN placed call to pt to verify upcoming apts.  Pt also requesting RN call pt sister Mardene Celeste to review apts as well.  RN spoke with Mardene Celeste who verified understanding up upcoming apts.  Schedule also mailed to pt address on file.

## 2020-01-10 ENCOUNTER — Ambulatory Visit (HOSPITAL_COMMUNITY): Admission: RE | Admit: 2020-01-10 | Payer: Medicare Other | Source: Ambulatory Visit

## 2020-01-13 ENCOUNTER — Inpatient Hospital Stay: Payer: Medicare Other | Admitting: Hematology and Oncology

## 2020-01-18 ENCOUNTER — Ambulatory Visit: Payer: Medicare Other | Admitting: Licensed Clinical Social Worker

## 2020-01-18 ENCOUNTER — Other Ambulatory Visit: Payer: Self-pay

## 2020-01-18 ENCOUNTER — Other Ambulatory Visit: Payer: Self-pay | Admitting: Family Medicine

## 2020-01-18 DIAGNOSIS — R159 Full incontinence of feces: Secondary | ICD-10-CM

## 2020-01-18 DIAGNOSIS — R262 Difficulty in walking, not elsewhere classified: Secondary | ICD-10-CM

## 2020-01-18 DIAGNOSIS — Z7189 Other specified counseling: Secondary | ICD-10-CM

## 2020-01-18 DIAGNOSIS — I6789 Other cerebrovascular disease: Secondary | ICD-10-CM

## 2020-01-18 NOTE — Patient Instructions (Signed)
Licensed Clinical Social Worker Visit Information Ms. Osias  it was nice speaking with you. Please call me directly if you have questions [phone] Goals we discussed today:  Goals Addressed            This Visit's Progress   . I Need Depends   On track     CARE PLAN ENTRY (see longtitudinal plan of care for additional care plan information)  Current Barriers & progress:  . Patient with HTN and chronic Hep C. needs incontinent supplies  . Unable to get to bathroom and wetting self several times a day . Acknowledges deficits and needs support, education and care coordination in order to meet this unmet need  . Patient received supplies however the briefs are too small Clinical Goal(s):  Over the next 30 days, patient will receive incontinent supplies that meet her needs Interventions: . Assessed patient needs, and how current supplies are working . Collaborate with [facility] regarding patient needs for getting a larger size brief Patient Self Care Activities: . Patient is unable to independently navigate getting incontinent supplies without care coordination support  . Patient will make the small briefs work until she is able to get what she needs Please see past updates related to this goal by clicking on the "Past Updates" button in the selected goal        . need toilet seat for commode   Not on track     [place] (see longtitudinal plan of care for additional care plan information)  Current Barriers and progress:  . Chronic Disease Management support, education, and care coordination needs related to HTN and chronic Hep C. . Unable to take a bath and use toilet due to difficulty getting up without the assistance of something to hold on to  . Patient reports shower bench was delivered still waiting on seat for commode . Patient continues to have difficulty getting up from commode and needs a raised seat and something to hold on to Clinical Goal(s)  Over the next 30  days patient will have needs met and feel safe when getting up from commode   Interventions  . Assessed care coordination needs and barriers  . Collaborated with PCP regarding patient needs and request for DME . Reach out to [facility] for an update on DME Patient Self Care Activities  . Patient is unable to independently obtain DME without coordination of CCM  Please see past updates related to this goal by clicking on the "Past Updates" button in the selected goal      . Patient needs Advance Directives       CARE PLAN ENTRY (see longtitudinal plan of care for additional care plan information)  Current Barriers:  . Patient with HTN and chronic Hep C. does not have an Advance Directive . Acknowledges deficits, education and support in order to complete this document . Limited education about the importance of naming a healthcare power of attorney Clinical Social Work Goal(s):  [name] Over the next 20 days, the patient will review mailed EMMI education on Advance Directive as evidenced by patient self report of review . Over the next 30 days, patient will verbalize basic understanding of Advanced Directives and importance of completion . Over the next 30 days, with assistance of LCSW, the patient will complete mailed Advance Directive packet  . Over the next 45 days, the patient will  work with care management team on completion of Advance Directive  . Over the next 45 days,  the patient will have Advance Directive notarized and provide a copy to provider office Interventions provided by LCSW: . Interviewed patient about Financial controller and provided education about the importance of completing advanced directives . Mailed the patient an EMMI educational handout on Advance Directives as well as an Emergency planning/management officer . Advised patient to review information mailed by LCSW . Will provided education and assistance to client regarding Advanced Directives. . A voluntary discussion about advanced  care planning including explanation and discussion of advanced directives, healthcare proxy and living will was discussed with the patient.  Patient Self Care Activities:  . Is unable to complete documentation independently . Able to identify next of kin or [place] . Patient will review the mailed document  Initial goal documentation      Materials provided: Yes: advance Directives Ms. Nori Riis received Care Management services today:  1. Care Management services include personalized support from designated clinical staff supervised by her physician, including individualized plan of care and coordination with other care providers 2. 24/7 contact [phone] for assistance for urgent and routine care needs. 3. Care Management are voluntary services and be declined at any time by calling the office.  Patient verbally agreed to assistance and services provided by embedded care coordination/care management team today.   Patient verbalizes understanding of instructions provided today.  Follow up plan:  SW will follow up with patient by phone over the next 2 to 3 weeks  Maurine Cane, LCSW

## 2020-01-18 NOTE — Chronic Care Management (AMB) (Signed)
Care Management   Clinical Social Work Follow Up   01/18/2020 Name: TRISHELLE DEVORA MRN: [id number] DOB: [birth year]  Referred by: [name], DO  Reason for referral : Care Coordination (F/U for supplies)  KEILYN HAGGARD is a 64 y.o. year old female who is a primary care patient of [name], DO.  Reason for follow-up: Phone encounter with patient today for ongoing assessment and brief interventions to assist with care coordination needs.   Assessment: Patient has not reviewed Raised commode seat. Over reports she is doing well.  Review of patient status, including review of consultants reports, relevant laboratory and other test results, and collaboration with appropriate care team members and the patient's provider was performed as part of comprehensive patient evaluation and provision of care management services.    Advance Directive Status: N See Care Plan for related entries. SDOH (Social Determinants of Health) assessments performed: No   Goals Addressed            This Visit's Progress   . I Need Depends   On track     CARE PLAN ENTRY (see longtitudinal plan of care for additional care plan information)  Current Barriers & progress:  . Patient with HTN and chronic Hep C. needs incontinent supplies  . Unable to get to bathroom and wetting self several times a day . Acknowledges deficits and needs support, education and care coordination in order to meet this unmet need  . Patient received supplies however the briefs are too small Clinical Goal(s):  Over the next 30 days, patient will receive incontinent supplies that meet her needs Interventions: . Assessed patient needs, and how current supplies are working . Collaborate with [facility] regarding patient needs for getting a larger size brief Patient Self Care Activities: . Patient is unable to independently navigate getting incontinent supplies without care coordination support  . Patient will make the small briefs work  until she is able to get what she needs Please see past updates related to this goal by clicking on the "Past Updates" button in the selected goal       . need toilet seat for commode   Not on track     [place] (see longtitudinal plan of care for additional care plan information)  Current Barriers and progress:  . Chronic Disease Management support, education, and care coordination needs related to HTN and chronic Hep C. . Unable to take a bath and use toilet due to difficulty getting up without the assistance of something to hold on to  . Patient reports shower bench was delivered still waiting on seat for commode . Patient continues to have difficulty getting up from commode and needs a raised seat and something to hold on to Clinical Goal(s)  Over the next 30 days patient will have needs met and feel safe when getting up from commode   Interventions  . Assessed care coordination needs and barriers  . Collaborated with PCP regarding patient needs and request for DME . Reach out to [facility] for an update on DME Patient Self Care Activities  . Patient is unable to independently obtain DME without coordination of CCM  Please see past updates related to this goal by clicking on the "Past Updates" button in the selected goal      . Patient needs Advance Directives       CARE PLAN ENTRY (see longtitudinal plan of care for additional care plan information)  Current Barriers:  . Patient with HTN  and chronic Hep C. does not have an Advance Directive . Acknowledges deficits, education and support in order to complete this document . Limited education about the importance of naming a healthcare power of attorney Clinical Social Work Goal(s):  [name] Over the next 20 days, the patient will review mailed EMMI education on Advance Directive as evidenced by patient self report of review . Over the next 30 days, patient will verbalize basic understanding of Advanced Directives and importance of  completion . Over the next 30 days, with assistance of LCSW, the patient will complete mailed Advance Directive packet  . Over the next 45 days, the patient will  work with care management team on completion of Advance Directive  . Over the next 45 days, the patient will have Advance Directive notarized and provide a copy to provider office Interventions provided by LCSW: . Interviewed patient about Financial controller and provided education about the importance of completing advanced directives . Mailed the patient an EMMI educational handout on Advance Directives as well as an Emergency planning/management officer . Advised patient to review information mailed by LCSW . Will provided education and assistance to client regarding Advanced Directives. . A voluntary discussion about advanced care planning including explanation and discussion of advanced directives, healthcare proxy and living will was discussed with the patient.  Patient Self Care Activities:  . Is unable to complete documentation independently . Able to identify next of kin or [place] . Patient will review the mailed document  Initial goal documentation      Outpatient Encounter Medications as of 01/18/2020  Medication Sig  . albuterol (PROVENTIL HFA;VENTOLIN HFA) 108 (90 BASE) MCG/ACT inhaler Inhale 2 puffs into the lungs every 6 (six) hours as needed for wheezing. For wheezing  . amLODipine (NORVASC) 5 MG tablet Take 1 tablet (5 mg total) by mouth daily.  [name] anastrozole (ARIMIDEX) 1 MG tablet Take 1 tablet (1 mg total) by mouth daily.  [name] aspirin 81 MG EC tablet Take 81 mg by mouth daily.    [name] atorvastatin (LIPITOR) 20 MG tablet Take 1 tablet (20 mg total) by mouth daily.  . carvedilol (COREG) 6.25 MG tablet Take 1 tablet (6.25 mg total) by mouth 2 (two) times daily with a meal.  . clopidogrel (PLAVIX) 75 MG tablet Take 75 mg by mouth daily.   . Incontinence Supply Disposable (COMFORT SHIELD ADULT  DIAPERS) MISC 1 each by Does not apply route every 4 (four) hours as needed.  [name] lisinopril (PRINIVIL,ZESTRIL) 40 MG tablet Take 1 tablet (40 mg total) by mouth daily.  . metoCLOPramide (REGLAN) 5 MG tablet Take 1 tablet (5 mg total) by mouth every 8 (eight) hours as needed for nausea.  . nitroGLYCERIN (NITROSTAT) 0.4 MG SL tablet Place 0.4 mg under the tongue every 5 (five) minutes as needed for chest pain.   [name] ondansetron (ZOFRAN) 4 MG tablet Take 1 tablet (4 mg total) by mouth every 8 (eight) hours as needed for nausea or vomiting.  . potassium chloride SA (K-DUR,KLOR-CON) 20 MEQ tablet Take 1 tablet (20 mEq total) by mouth daily.   Facility-Administered Encounter Medications as of 01/18/2020  Medication  . mitoMYcin (MUTAMYCIN) Injection Use in OR only (0.4 mg/ml)   Plan: Patient will review advance directive packet prior to next outreach  LCSW will  1. mail Advance Directive packet 2. F/U with PCP and [facility] on DME request 3. F/U with [facility] for correct brief size   [name], LCSW  Clinical Social Worker [place] / [place]   [phone] 9:47 AM

## 2020-01-18 NOTE — Progress Notes (Signed)
DME bedside commode ordered

## 2020-01-19 ENCOUNTER — Ambulatory Visit: Payer: Self-pay | Admitting: Licensed Clinical Social Worker

## 2020-01-19 NOTE — Chronic Care Management (AMB) (Signed)
   Social Work  Care Management Collaboration 01/19/2020 Name: [name] MRN: [id number] DOB: 05-21-1956  [name] is a 64 y.o. year old female who sees [name], DO for primary care. LCSW was consulted by [employer] to assistance patient with getting correct size in briefs for incontinence supplies. Patient was not interviewed or contacted during this encounter however LCSW collaborated with Ronalee Belts from [employer]. Recommendation: After collaboration, [employer] will contact patient and arrange to send her the correct brief size. Intervention:  Review of patient status, including review of consultants reports, relevant laboratory and other test results, and collaboration with appropriate care team members and the patient's provider was performed as part of comprehensive patient evaluation and provision of chronic care management services.   Plan: LCSW will F/U with patient in the next 3 to 4 weeks  [name], [place] / [place]   [phone] 11:04 AM

## 2020-01-20 ENCOUNTER — Other Ambulatory Visit: Payer: Self-pay

## 2020-01-20 ENCOUNTER — Ambulatory Visit (HOSPITAL_COMMUNITY)
Admission: RE | Admit: 2020-01-20 | Discharge: 2020-01-20 | Disposition: A | Discharge status: Home / Self Care | Payer: Medicare Other | Source: Ambulatory Visit | Attending: Hematology and Oncology | Admitting: Hematology and Oncology

## 2020-01-20 DIAGNOSIS — R52 Pain, unspecified: Secondary | ICD-10-CM | POA: Diagnosis not present

## 2020-01-20 DIAGNOSIS — I2510 Atherosclerotic heart disease of native coronary artery without angina pectoris: Secondary | ICD-10-CM | POA: Diagnosis not present

## 2020-01-20 DIAGNOSIS — D0512 Intraductal carcinoma in situ of left breast: Secondary | ICD-10-CM | POA: Diagnosis not present

## 2020-01-20 DIAGNOSIS — I700 Atherosclerosis of aorta: Secondary | ICD-10-CM | POA: Diagnosis not present

## 2020-01-20 DIAGNOSIS — Z9012 Acquired absence of left breast and nipple: Secondary | ICD-10-CM | POA: Diagnosis not present

## 2020-01-20 MED ORDER — SODIUM CHLORIDE (PF) 0.9 % IJ SOLN
INTRAMUSCULAR | Status: AC
  Filled 2020-01-20: qty 50

## 2020-01-20 MED ORDER — IOHEXOL 300 MG/ML  SOLN
100.0000 mL | Freq: Once | INTRAMUSCULAR | Status: AC | PRN
  Administered 2020-01-20: 100 mL via INTRAVENOUS

## 2020-01-22 NOTE — Progress Notes (Signed)
Patient Care Team: [name], DO as PCP - General (Family Medicine) [name], MD as Consulting Physician (Hematology and Oncology) [name], [name], NP as Nurse Practitioner (Hematology and Oncology) [name], MD as Consulting Physician (Radiation Oncology) [name], MD as Consulting Physician (General Surgery)  DIAGNOSIS:    ICD-10-CM   1. Malignant neoplasm of overlapping sites of left breast in female, estrogen receptor positive ([place])  C50.812    Z17.0     SUMMARY OF ONCOLOGIC HISTORY: Oncology History  Ductal carcinoma in situ (DCIS) of left breast  08/13/2017 Initial Diagnosis   Large left breast mass involving all quadrants measuring 7.5 cm with enlarged axillary lymph node, biopsy revealed high-grade  DCIS ER 40% PR 20%,, lymph node biopsy negative, Tis N0 stage 0   09/08/2017 Surgery   Left mastectomy: IDC grade 2, 0.6 cm, extensive high-grade DCIS with necrosis, margins negative, 1/8 lymph nodes with micrometastases, T1bN1Mic (Stage 1A)   Malignant neoplasm of overlapping sites of left breast in female, estrogen receptor positive ([place])  08/19/2017 Initial Diagnosis   Malignant neoplasm of overlapping sites of left breast in female, estrogen receptor positive ([place])   09/08/2017 Surgery   Left mastectomy: IDC grade 2, 0.6 cm, extensive DCIS, high-grade with necrosis, margins negative, 1/8 lymph node with micrometastatic disease, T1BN1 mic stage I a, ER 100%, PR 100%, Ki-67 70%, HER-2 negative ratio 1.23.   09/2017 -  Anti-estrogen oral therapy   Anastrozole 1m daily     CHIEF COMPLIANT: Follow-up ofleft breast cancer onanastrozole therapy  INTERVAL HISTORY: Morgan LOBERGis a 64y.o. with above-mentioned history of leftbreast cancer treated with leftmastectomyand is currently on anti-estrogen therapywith anastrozole.CT CAP on 01/20/20 showed no evidence of metastatic disease. She presents to the clinic today to review her scans.  She  complains of nausea that makes it difficult for her to eat.  She has been drinking muscle milk which is helping her.     ALLERGIES:  is allergic to bee venom and other.  MEDICATIONS:  Current Outpatient Medications  Medication Sig Dispense Refill  . albuterol (PROVENTIL HFA;VENTOLIN HFA) 108 (90 BASE) MCG/ACT inhaler Inhale 2 puffs into the lungs every 6 (six) hours as needed for wheezing. For wheezing    . amLODipine (NORVASC) 5 MG tablet Take 1 tablet (5 mg total) by mouth daily.    .Marland Kitchenanastrozole (ARIMIDEX) 1 MG tablet Take 1 tablet (1 mg total) by mouth daily. 90 tablet 3  . aspirin 81 MG EC tablet Take 81 mg by mouth daily.      .Marland Kitchenatorvastatin (LIPITOR) 20 MG tablet Take 1 tablet (20 mg total) by mouth daily.    . carvedilol (COREG) 6.25 MG tablet Take 1 tablet (6.25 mg total) by mouth 2 (two) times daily with a meal.    . clopidogrel (PLAVIX) 75 MG tablet Take 75 mg by mouth daily.     . Incontinence Supply Disposable (COMFORT SHIELD ADULT DIAPERS) MISC 1 each by Does not apply route every 4 (four) hours as needed. 360 each 1  . lisinopril (PRINIVIL,ZESTRIL) 40 MG tablet Take 1 tablet (40 mg total) by mouth daily. 30 tablet 1  . metoCLOPramide (REGLAN) 5 MG tablet Take 1 tablet (5 mg total) by mouth every 8 (eight) hours as needed for nausea. 30 tablet 3  . nitroGLYCERIN (NITROSTAT) 0.4 MG SL tablet Place 0.4 mg under the tongue every 5 (five) minutes as needed for chest pain.     .Marland Kitchenondansetron (ZOFRAN) 4  MG tablet Take 1 tablet (4 mg total) by mouth every 8 (eight) hours as needed for nausea or vomiting. 20 tablet 0  . potassium chloride SA (K-DUR,KLOR-CON) 20 MEQ tablet Take 1 tablet (20 mEq total) by mouth daily.     No current facility-administered medications for this visit.   Facility-Administered Medications Ordered in Other Visits  Medication Dose Route Frequency Provider Last Rate Last Admin  . mitoMYcin (MUTAMYCIN) Injection Use in OR only (0.4 mg/ml)  0.5 mL Right Eye Once  Marylynn Pearson, MD        PHYSICAL EXAMINATION: ECOG PERFORMANCE STATUS: 1 - Symptomatic but completely ambulatory  Vitals:   01/23/20 1101  BP: (!) 179/90  Pulse: 70  Resp: 18  Temp: 98.7 F (37.1 C)  SpO2: 100%   Filed Weights   01/23/20 1101  Weight: 121 lb 11.2 oz (55.2 kg)    LABORATORY DATA:  I have reviewed the data as listed CMP Latest Ref Rng & Units 12/26/2019 10/22/2018 05/31/2018  Glucose 70 - 99 mg/dL 89 97 84  BUN 8 - 23 mg/dL '9 15 13  ' Creatinine 0.44 - 1.00 mg/dL 0.87 1.09(H) 1.00  Sodium 135 - 145 mmol/L 139 141 141  Potassium 3.5 - 5.1 mmol/L 4.0 3.0(L) 4.3  Chloride 98 - 111 mmol/L 105 107 100  CO2 22 - 32 mmol/L '28 28 25  ' Calcium 8.9 - 10.3 mg/dL 9.1 8.3(L) 10.1  Total Protein 6.5 - 8.1 g/dL 7.1 6.4(L) 7.2  Total Bilirubin 0.3 - 1.2 mg/dL 0.2(L) 0.3 0.3  Alkaline Phos 38 - 126 U/L 51 39 48  AST 15 - 41 U/L 16 14(L) 14  ALT 0 - 44 U/L '9 8 7    ' Lab Results  Component Value Date   WBC 8.2 12/26/2019   HGB 11.1 (L) 12/26/2019   HCT 32.7 (L) 12/26/2019   MCV 86.5 12/26/2019   PLT 276 12/26/2019   NEUTROABS 4.4 12/26/2019    ASSESSMENT & PLAN:  Malignant neoplasm of overlapping sites of left breast in female, estrogen receptor positive ([place]) 08/13/2017: Mammogram and ultrasound:Large left breast mass involving all quadrants measuring 7.5 cm with enlarged axillary lymph node, biopsy revealed high-grade DCIS ER 40% PR 20%,, lymph node biopsy negative, Tis N0 stage 0  09/08/2017:Left mastectomy: IDC grade 2, 0.6 cm, extensive DCIS, high-grade with necrosis, margins negative, 1/8 lymph node with micrometastatic disease, T1BN1 mic stage I a, ER 100%, PR 100%, Ki-67 70%, HER-2 negative ratio 1.23. Did not receive radiation because of comorbidities  Current treatment: antiestrogen therapy with anastrozole5 yearsstarted 09/28/2017 Patient history of Stroke and coronary artery disease  Anastrozole toxicities:Denies any hot flashes or myalgias.     CT CAP 01/20/2020: No evidence of metastatic disease.  Breast cancer surveillance: 1.Breast exam2/06/2020: Benign 2.Right breast mammogramscheduled for 01/27/2020  Return to clinic in 1 year for follow-up  No orders of the defined types were placed in this encounter.  The patient has a good understanding of the overall plan. she agrees with it. she will call with any problems that may develop before the next visit here.  Total time spent: 20 mins including face to face time and time spent for planning, charting and coordination of care  [name], MD 01/23/2020  I, Cloyde Reams Dorshimer, am acting as scribe for Dr. [name].  I have reviewed the above documentation for accuracy and completeness, and I agree with the above.

## 2020-01-23 ENCOUNTER — Other Ambulatory Visit: Payer: Self-pay

## 2020-01-23 ENCOUNTER — Inpatient Hospital Stay: Payer: Medicare Other | Attending: Hematology and Oncology | Admitting: Hematology and Oncology

## 2020-01-23 ENCOUNTER — Telehealth: Payer: Self-pay | Admitting: Hematology and Oncology

## 2020-01-23 DIAGNOSIS — Z7902 Long term (current) use of antithrombotics/antiplatelets: Secondary | ICD-10-CM | POA: Diagnosis not present

## 2020-01-23 DIAGNOSIS — Z8673 Personal history of transient ischemic attack (TIA), and cerebral infarction without residual deficits: Secondary | ICD-10-CM | POA: Insufficient documentation

## 2020-01-23 DIAGNOSIS — Z9012 Acquired absence of left breast and nipple: Secondary | ICD-10-CM | POA: Insufficient documentation

## 2020-01-23 DIAGNOSIS — I2510 Atherosclerotic heart disease of native coronary artery without angina pectoris: Secondary | ICD-10-CM | POA: Insufficient documentation

## 2020-01-23 DIAGNOSIS — C50812 Malignant neoplasm of overlapping sites of left female breast: Secondary | ICD-10-CM | POA: Insufficient documentation

## 2020-01-23 DIAGNOSIS — R110 Nausea: Secondary | ICD-10-CM | POA: Insufficient documentation

## 2020-01-23 DIAGNOSIS — Z7982 Long term (current) use of aspirin: Secondary | ICD-10-CM | POA: Insufficient documentation

## 2020-01-23 DIAGNOSIS — Z79811 Long term (current) use of aromatase inhibitors: Secondary | ICD-10-CM | POA: Insufficient documentation

## 2020-01-23 DIAGNOSIS — Z79899 Other long term (current) drug therapy: Secondary | ICD-10-CM | POA: Insufficient documentation

## 2020-01-23 DIAGNOSIS — Z170 Estrogen receptor positive status [ER+]: Secondary | ICD-10-CM | POA: Diagnosis not present

## 2020-01-23 NOTE — Assessment & Plan Note (Signed)
08/13/2017: Mammogram and ultrasound:Large left breast mass involving all quadrants measuring 7.5 cm with enlarged axillary lymph node, biopsy revealed high-grade DCIS ER 40% PR 20%,, lymph node biopsy negative, Tis N0 stage 0  09/08/2017:Left mastectomy: IDC grade 2, 0.6 cm, extensive DCIS, high-grade with necrosis, margins negative, 1/8 lymph node with micrometastatic disease, T1BN1 mic stage I a, ER 100%, PR 100%, Ki-67 70%, HER-2 negative ratio 1.23. Did not receive radiation because of comorbidities  Current treatment: antiestrogen therapy with anastrozole5 yearsstarted 09/28/2017 Patient history of Stroke and coronary artery disease  Anastrozole toxicities:Denies any hot flashes or myalgias. Her brother has been feeding her and helping with transportation. Diffuse aches and fatigue:  CT CAP 01/20/2020: No evidence of metastatic disease.  Breast cancer surveillance: 1.Breast exam2/06/2020: Benign 2.Right breast mammogramscheduled for 01/27/2020

## 2020-01-23 NOTE — Telephone Encounter (Signed)
I could not reach patient regarding schedule  °

## 2020-01-26 ENCOUNTER — Telehealth: Payer: Self-pay | Admitting: Family Medicine

## 2020-01-27 ENCOUNTER — Other Ambulatory Visit: Payer: Self-pay | Admitting: Family Medicine

## 2020-01-27 ENCOUNTER — Other Ambulatory Visit: Payer: Self-pay

## 2020-01-27 ENCOUNTER — Ambulatory Visit
Admission: RE | Admit: 2020-01-27 | Discharge: 2020-01-27 | Disposition: A | Discharge status: Home / Self Care | Payer: Medicare Other | Source: Ambulatory Visit | Attending: Family Medicine | Admitting: Family Medicine

## 2020-01-27 DIAGNOSIS — Z1239 Encounter for other screening for malignant neoplasm of breast: Secondary | ICD-10-CM

## 2020-01-27 DIAGNOSIS — D0512 Intraductal carcinoma in situ of left breast: Secondary | ICD-10-CM

## 2020-01-27 DIAGNOSIS — Z853 Personal history of malignant neoplasm of breast: Secondary | ICD-10-CM

## 2020-01-28 IMAGING — CR DG CHEST 1V PORT
1 series · 1 of 1 positions shown · non-contrast
Comparison: Chest radiograph March 06, 2017

CLINICAL DATA: Syncopal episode.

EXAM:
PORTABLE CHEST 1 VIEW

[portable]
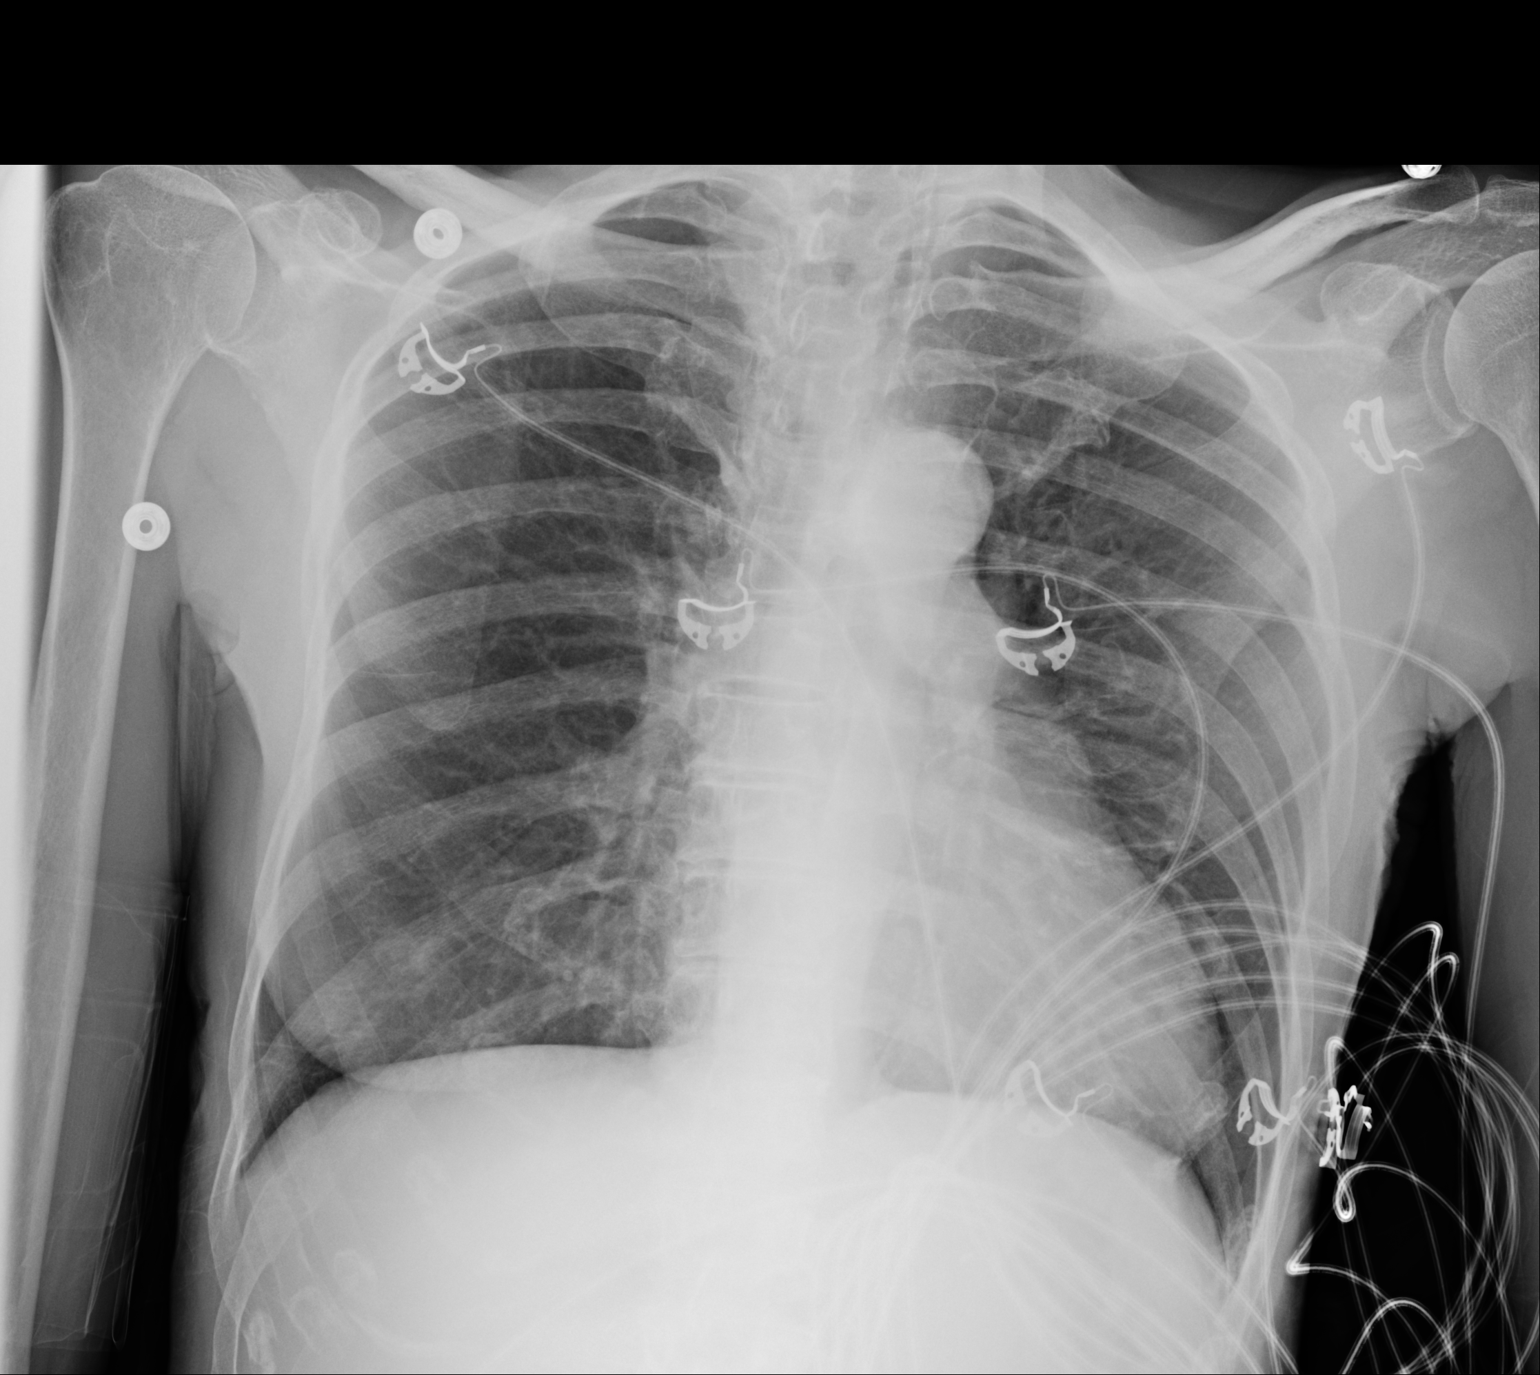

[1 of 1 positions shown; findings below may reference images not displayed]

FINDINGS: Cardiomediastinal silhouette is normal. No pleural effusions or
focal consolidations. Trachea projects midline and there is no
pneumothorax. Soft tissue planes and included osseous structures are
non-suspicious. Old RIGHT rib fractures.
IMPRESSION: No active disease.

## 2020-01-28 IMAGING — CT CT HEAD W/O CM
3 series · 15 of 47 positions shown, 18 images · non-contrast
Comparison: 309

CLINICAL DATA: Altered level of consciousness.

EXAM:
CT HEAD WITHOUT CONTRAST
TECHNIQUE: Contiguous axial images were obtained from the base of the skull
through the vertex without intravenous contrast.

[Series 2: head wo · axial · 0.44mm/px · z∈[+45,+170]mm · 9 of 31 slices shown, 12 images]
[im 3/31  brain]
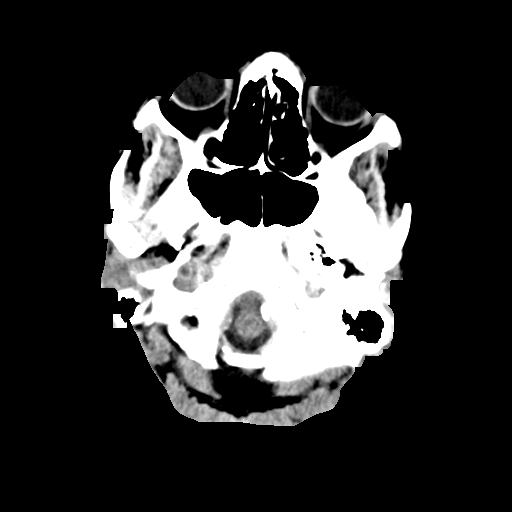
[im 3/31  bone]
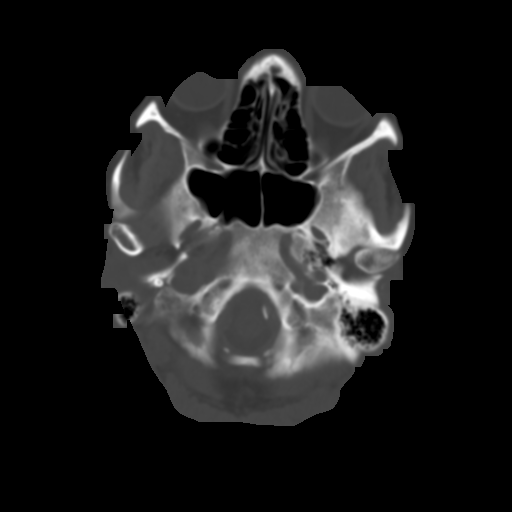
[im 6/31  brain]
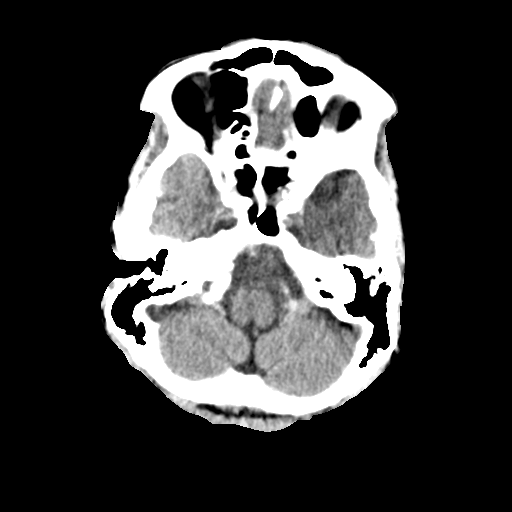
[im 9/31  brain]
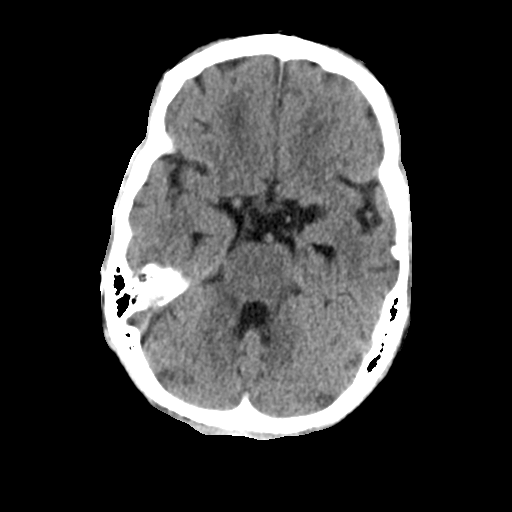
[im 12/31  brain]
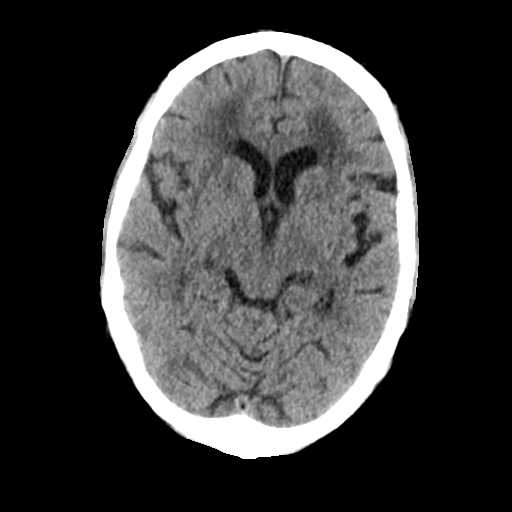
[im 16/31  brain]
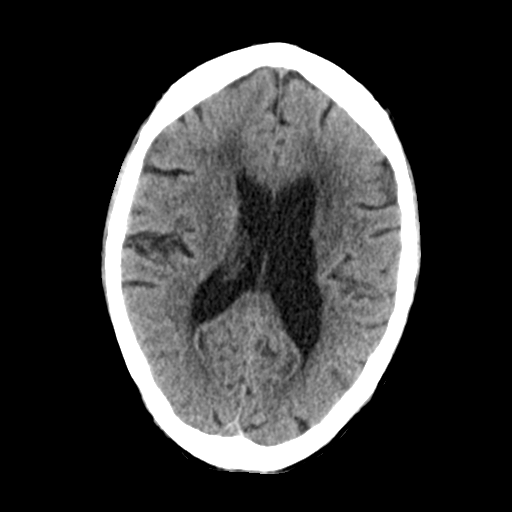
[im 16/31  bone]
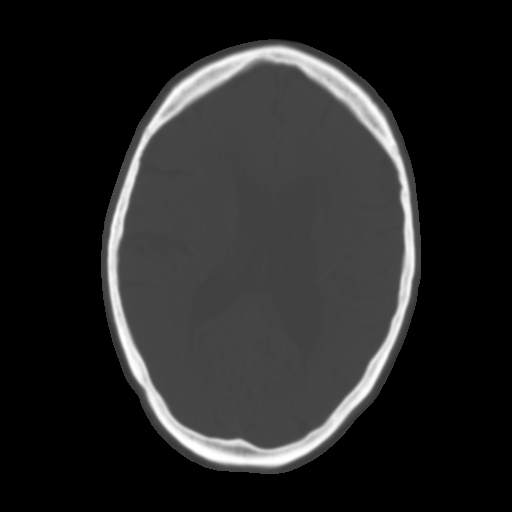
[im 19/31  brain]
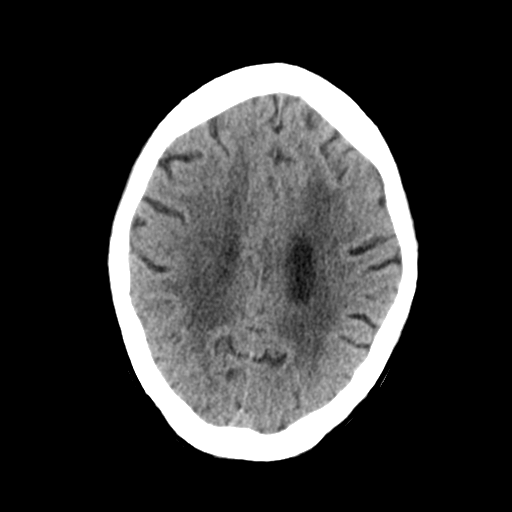
[im 22/31  brain]
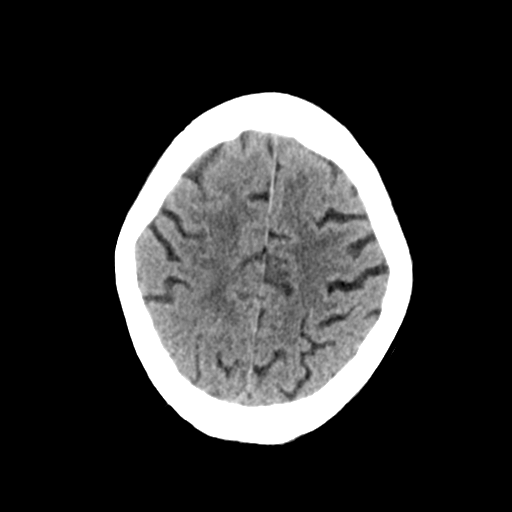
[im 25/31  brain]
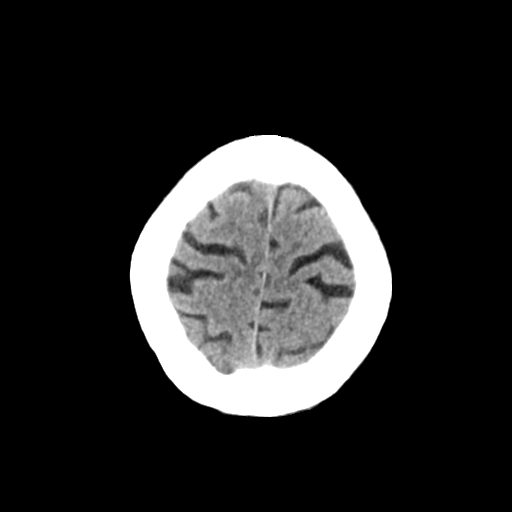
[im 28/31  brain]
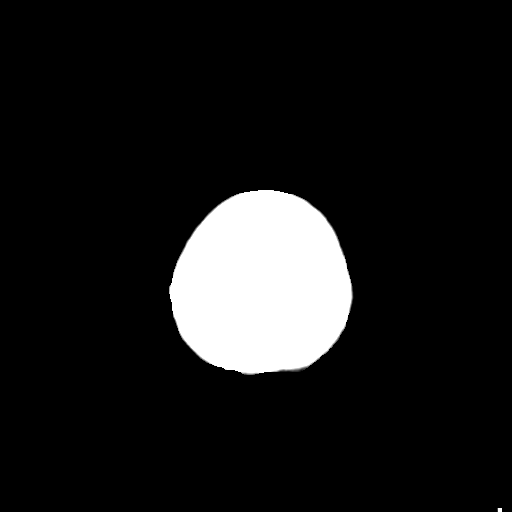
[im 28/31  bone]
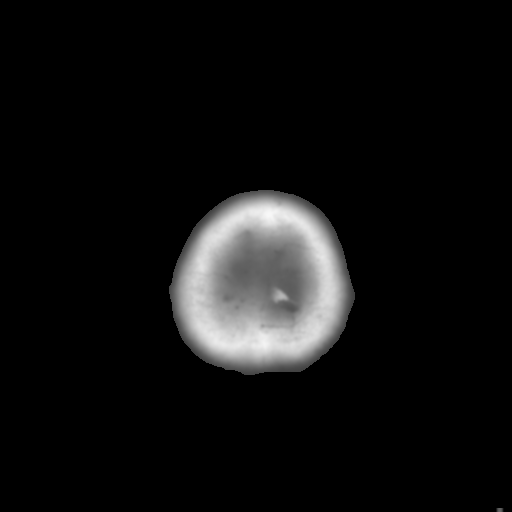

[Series 4: coronal soft tissue · coronal · 0.35mm/px · 3 of 74 slices shown]
[im 25/74  brain]
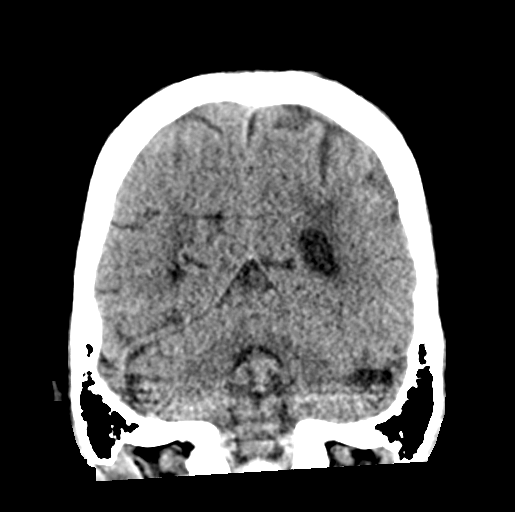
[im 33/74  brain]
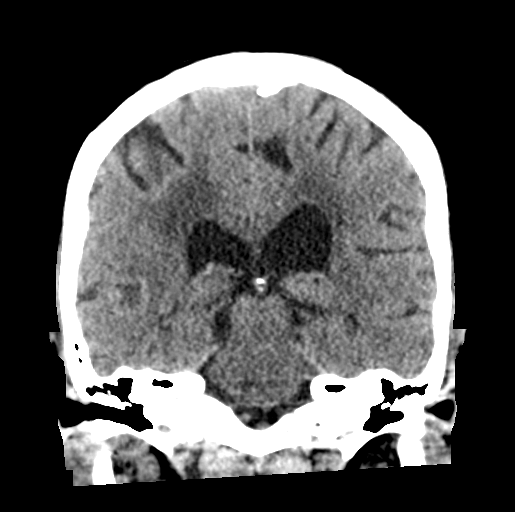
[im 41/74  brain]
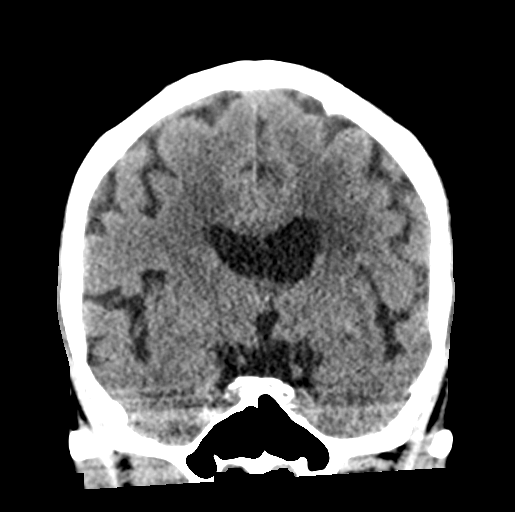

[Series 5: sagittal soft tissue · sagittal · 0.33mm/px · 3 of 61 slices shown]
[im 21/61  brain]
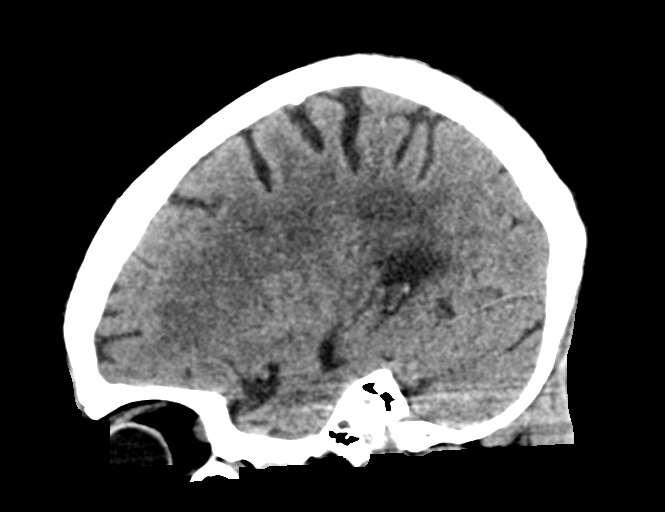
[im 31/61  brain]
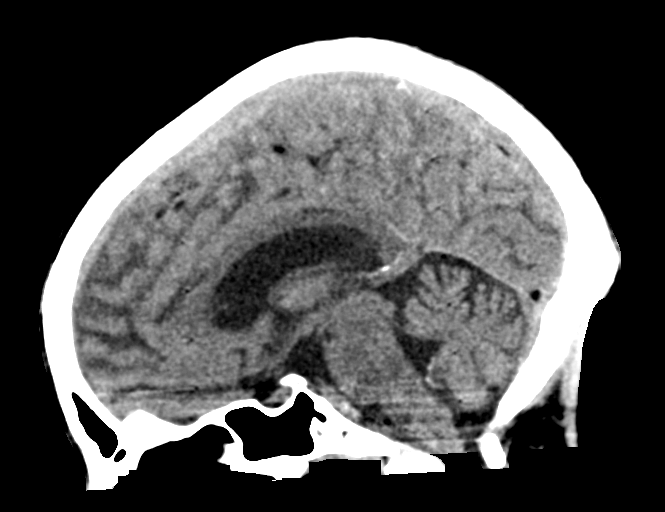
[im 41/61  brain]
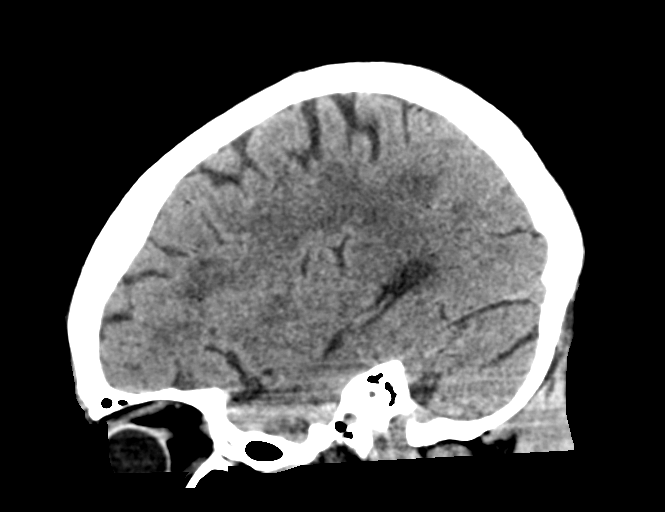

[15 of 47 positions shown; findings below may reference images not displayed]

FINDINGS: BRAIN: There is sulcal and ventricular prominence consistent with
superficial and central atrophy. No intraparenchymal hemorrhage,
mass effect nor midline shift. Moderate degree of periventricular
and subcortical white matter hypodensities consistent with chronic
small vessel ischemic disease are identified. No acute large
vascular territory infarcts. No abnormal extra-axial fluid
collections. Basal cisterns are not effaced and midline.

VASCULAR: Moderate calcific atherosclerosis of the carotid siphons.

SKULL: No skull fracture. No significant scalp soft tissue swelling.

SINUSES/ORBITS: The mastoid air-cells are clear. The included
paranasal sinuses are well-aerated.The included ocular globes and
orbital contents are non-suspicious. Status post right lens
replacement.

OTHER: None.
IMPRESSION: Moderate small vessel ischemic disease. Cerebral atrophy. No acute
intracranial abnormality.

## 2020-02-01 ENCOUNTER — Other Ambulatory Visit: Payer: Self-pay

## 2020-02-01 ENCOUNTER — Ambulatory Visit: Payer: Medicare Other | Admitting: Licensed Clinical Social Worker

## 2020-02-01 ENCOUNTER — Encounter: Payer: Self-pay | Admitting: Licensed Clinical Social Worker

## 2020-02-01 DIAGNOSIS — Z139 Encounter for screening, unspecified: Secondary | ICD-10-CM

## 2020-02-01 DIAGNOSIS — I10 Essential (primary) hypertension: Secondary | ICD-10-CM

## 2020-02-01 NOTE — Patient Instructions (Signed)
Licensed Clinical Social Worker Visit Information [name]  it was nice speaking with you. Please call me directly if you have questions [phone] Goals we discussed today:  Goals Addressed            This Visit's Progress   . Client will have food insecurities addressed by [facility]       CARE PLAN ENTRY (see longitudinal plan of care for additional care plan information)  Current Barriers:  . Patient with HTN and chronic Hep C. needs community resources for food and coupons for Glucerna  . Patient reports she does not always feel like eating so she drinks Glucerna but not always able to afford it . Acknowledges deficits and needs support, education and care coordination in order to meet this unmet need  . Limited food benefits that don't last for the month . Limited income with purchasing food and supplemental drinks  Clinical Goal(s)  . Over the next 30 days patient will be able to have food needs met as demonstrated by having food delivered by [facility] foodbox program and not having a fear of running out of food. . patient will work with [facility] to establish food delivery day and time   Interventions provided by LCSW:  . Assessed patient's care coordination needs and barriers . Provided patient with information about [facility] Foodbox program.  . Received verbal permission from patient to complete, and submit [facility] foodbox referral . Mailed Glucerna coupons to patient . Collaborated with appropriate clinical care team members regarding patient needs Patient Self Care Activities & Deficits:  . Patient is unable to independently navigate community resource options without care coordination support  . Acknowledges deficits and is motivated to resolve concern  . Unable to perform ADLs independently has an aide that comes daily Initial goal documentation    . COMPLETED: I Need Depends        CARE PLAN ENTRY (see longtitudinal plan of care for additional care plan  information)  Current Barriers & progress:  . Patient with HTN and chronic Hep C. needs incontinent supplies  . Unable to get to bathroom and wetting self several times a day . Acknowledges deficits and needs support, education and care coordination in order to meet this unmet need  . Patient received supplies however the briefs are too small . Correct size for incontinent supplies will be delivered tomorrow , patient has spoken to vendor  Clinical Goal(s):  Over the next 30 days, patient will receive incontinent supplies that meet her needs Interventions: . Assessed patient needs, and how current supplies are working . Collaborate with [employer] regarding patient needs for getting a larger size brief Patient Self Care Activities: . Patient is unable to independently navigate getting incontinent supplies without care coordination support  . Patient will make the small briefs work until she is able to get what she needs Please see past updates related to this goal by clicking on the "Past Updates" button in the selected goal        . COMPLETED: need toilet seat for commode        CARE PLAN ENTRY (see longtitudinal plan of care for additional care plan information)  Current Barriers and progress:  . Chronic Disease Management support, education, and care coordination needs related to HTN and chronic Hep C. . Unable to take a bath and use toilet due to difficulty getting up without the assistance of something to hold on to  . Patient reports shower bench  was delivered still waiting on seat for commode . Patient continues to have difficulty getting up from commode and needs a raised seat and something to hold on to . Patient reports seat has been delivered Clinical Goal(s)  Over the next 30 days patient will have needs met and feel safe when getting up from commode   Interventions  . Assessed care coordination needs and barriers  . Collaborated with PCP regarding patient needs and request  for DME . Reach out to [employer] for an update on DME Patient Self Care Activities  . Patient is unable to independently obtain DME without coordination of CCM  Please see past updates related to this goal by clicking on the "Past Updates" button in the selected goal      . Patient needs Advance Directives       CARE PLAN ENTRY (see longtitudinal plan of care for additional care plan information)  Current Barriers  Progress:  . Patient with HTN and chronic Hep C. does not have an Advance Directive . Acknowledges deficits, education and support in order to complete this document . Limited education about the importance of naming a healthcare power of attorney . Received advance directive package but was torn and delayed by mail, patient requested LCSW send another copy Clinical Social Work Goal(s):  [name] Over the next 30 days, the patient will review mailed EMMI education on Advance Directive as evidenced by patient self report of review . Over the next 45 days, with assistance of LCSW, the patient will complete mailed Advance Directive packet, have notarized and provide a copy to provider office Interventions provided by LCSW: . Mailed the patient 2nd EMMI educational handout on Advance Directives as well as an Forensic scientist packet . Advised patient to review information mailed by LCSW . Will provided education and assistance to client regarding Advanced Directives. Patient Self Care Activities:  . Is unable to complete documentation independently . Able to identify next of kin or [place] . Patient will review the mailed document  Please see past updates related to this goal by clicking on the "Past Updates" button in the selected goal       Materials provided: Yes: coupons, advance directives and food program [name] received Care Management services today:  1. Care Management services include personalized support from designated clinical staff  supervised by her physician, including individualized plan of care and coordination with other care providers 2. 24/7 contact [phone] for assistance for urgent and routine care needs. 3. Care Management are voluntary services and be declined at any time by calling the office. Patient  verbally agreed to assistance and services provided by embedded care coordination/care management team today.   Patient verbalizes understanding of instructions provided today.  Follow up plan:  SW will follow up with patient by phone over the next 3 to 4 weeks  Morgan Cane, LCSW

## 2020-02-01 NOTE — Chronic Care Management (AMB) (Signed)
Care Management   Clinical Social Work Follow Up   02/01/2020 Name: [name] MRN: [id number] DOB: 1956/09/12  Referred by: [name], DO  Reason for referral : Care Coordination (F/U for ongoing needs)  [name] is a 64 y.o. year old female who is a primary care patient of [name], DO.  Reason for follow-up: Phone encounter with patient today for ongoing assessment and brief interventions to assist with care coordination needs.   Assessment: Patient has make progress towards goal.  States she has received all DME .  Review of patient status, including review of consultants reports, relevant laboratory and other test results, and collaboration with appropriate care team members and the patient's provider was performed as part of comprehensive patient evaluation and provision of care management services.    Advance Directive Status: N See Care Plan for related entries. SDOH (Social Determinants of Health) assessments performed: Yes SDOH Interventions     Most Recent Value  SDOH Interventions  SDOH Interventions for the Following Domains  Food Insecurity  Food Insecurity Interventions  Other (Comment) [signed patient up for [facility] foodbox program]      Goals Addressed            This Visit's Progress   . Client will have food insecurities addressed by [employer]       CARE PLAN ENTRY (see longitudinal plan of care for additional care plan information)  Current Barriers:  . Patient with HTN and chronic Hep C. needs community resources for food and coupons for Glucerna  . Patient reports she does not always feel like eating so she drinks Glucerna but not always able to afford it . Acknowledges deficits and needs support, education and care coordination in order to meet this unmet need  . Limited food benefits that don't last for the month . Limited income with purchasing food and supplemental drinks  Clinical Goal(s)  . Over the next 30 days patient will be able  to have food needs met as demonstrated by having food delivered by [facility] foodbox program and not having a fear of running out of food. . patient will work with [employer] to establish food delivery day and time   Interventions provided by LCSW:  . Assessed patient's care coordination needs and barriers . Provided patient with information about [facility] Foodbox program.  . Received verbal permission from patient to complete, and submit [facility] foodbox referral . Mailed Glucerna coupons to patient  . Collaborated with appropriate clinical care team members regarding patient needs Patient Self Care Activities & Deficits:  . Patient is unable to independently navigate community resource options without care coordination support  . Acknowledges deficits and is motivated to resolve concern  . Unable to perform ADLs independently has an aide that comes daily Initial goal documentation    . COMPLETED: I Need Depends        CARE PLAN ENTRY (see longtitudinal plan of care for additional care plan information)  Current Barriers & progress:  . Patient with HTN and chronic Hep C. needs incontinent supplies  . Unable to get to bathroom and wetting self several times a day . Acknowledges deficits and needs support, education and care coordination in order to meet this unmet need  . Patient received supplies however the briefs are too small . Correct size for incontinent supplies will be delivered tomorrow , patient has spoken to vendor  Clinical Goal(s):  Over the next 30 days, patient will receive  incontinent supplies that meet her needs Interventions: . Assessed patient needs, and how current supplies are working . Collaborate with [employer] regarding patient needs for getting a larger size brief Patient Self Care Activities: . Patient is unable to independently navigate getting incontinent supplies without care coordination support  . Patient will make the small briefs work until she is able to get what  she needs Please see past updates related to this goal by clicking on the "Past Updates" button in the selected goal        . COMPLETED: need toilet seat for commode        CARE PLAN ENTRY (see longtitudinal plan of care for additional care plan information)  Current Barriers and progress:  . Chronic Disease Management support, education, and care coordination needs related to HTN and chronic Hep C. . Unable to take a bath and use toilet due to difficulty getting up without the assistance of something to hold on to  . Patient reports shower bench was delivered still waiting on seat for commode . Patient continues to have difficulty getting up from commode and needs a raised seat and something to hold on to . Patient reports seat has been delivered Clinical Goal(s)  Over the next 30 days patient will have needs met and feel safe when getting up from commode   Interventions  . Assessed care coordination needs and barriers  . Collaborated with PCP regarding patient needs and request for DME . Reach out to [employer] for an update on DME Patient Self Care Activities  . Patient is unable to independently obtain DME without coordination of CCM  Please see past updates related to this goal by clicking on the "Past Updates" button in the selected goal      . Patient needs Advance Directives       CARE PLAN ENTRY (see longtitudinal plan of care for additional care plan information)  Current Barriers  Progress:  . Patient with HTN and chronic Hep C. does not have an Advance Directive . Acknowledges deficits, education and support in order to complete this document . Limited education about the importance of naming a healthcare power of attorney . Received advance directive package but was torn and delayed by mail, patient requested LCSW send another copy Clinical Social Work Goal(s):  [name] Over the next 30 days, the patient will review mailed EMMI education on Advance Directive as evidenced by patient  self report of review . Over the next 45 days, with assistance of LCSW, the patient will complete mailed Advance Directive packet, have notarized and provide a copy to provider office Interventions provided by LCSW: . Mailed the patient 2nd EMMI educational handout on Advance Directives as well as an Forensic scientist packet . Advised patient to review information mailed by LCSW . Will provided education and assistance to client regarding Advanced Directives. Patient Self Care Activities:  . Is unable to complete documentation independently . Able to identify next of kin or [place] . Patient will review the mailed document  Please see past updates related to this goal by clicking on the "Past Updates" button in the selected goal        Outpatient Encounter Medications as of 02/01/2020  Medication Sig  . albuterol (PROVENTIL HFA;VENTOLIN HFA) 108 (90 BASE) MCG/ACT inhaler Inhale 2 puffs into the lungs every 6 (six) hours as needed for wheezing. For wheezing  . amLODipine (NORVASC) 5 MG tablet Take 1 tablet (5 mg total)  by mouth daily.  [name] anastrozole (ARIMIDEX) 1 MG tablet Take 1 tablet (1 mg total) by mouth daily.  [name] aspirin 81 MG EC tablet Take 81 mg by mouth daily.    [name] atorvastatin (LIPITOR) 20 MG tablet Take 1 tablet (20 mg total) by mouth daily.  . carvedilol (COREG) 6.25 MG tablet Take 1 tablet (6.25 mg total) by mouth 2 (two) times daily with a meal.  . clopidogrel (PLAVIX) 75 MG tablet Take 75 mg by mouth daily.   . Incontinence Supply Disposable (COMFORT SHIELD ADULT DIAPERS) MISC 1 each by Does not apply route every 4 (four) hours as needed.  [name] lisinopril (PRINIVIL,ZESTRIL) 40 MG tablet Take 1 tablet (40 mg total) by mouth daily.  . metoCLOPramide (REGLAN) 5 MG tablet Take 1 tablet (5 mg total) by mouth every 8 (eight) hours as needed for nausea.  . nitroGLYCERIN (NITROSTAT) 0.4 MG SL tablet Place 0.4 mg under the tongue every 5 (five) minutes  as needed for chest pain.   [name] ondansetron (ZOFRAN) 4 MG tablet Take 1 tablet (4 mg total) by mouth every 8 (eight) hours as needed for nausea or vomiting.  . potassium chloride SA (K-DUR,KLOR-CON) 20 MEQ tablet Take 1 tablet (20 mEq total) by mouth daily.   Facility-Administered Encounter Medications as of 02/01/2020  Medication  . mitoMYcin (MUTAMYCIN) Injection Use in OR only (0.4 mg/ml)   Plan:  1. LCSW will complete food referral 2. F/U with patient in 3 to 4 weeks  [name], [place] / [place]   [phone] 9:59 AM

## 2020-02-10 ENCOUNTER — Ambulatory Visit: Payer: Self-pay | Admitting: Licensed Clinical Social Worker

## 2020-02-10 NOTE — Chronic Care Management (AMB) (Signed)
   Social Work  Care Management Collaboration 02/10/2020 Name: [name] MRN: [id number] DOB: [birth year]  [name] is a 64 y.o. year old female who sees [name], DO for primary care. Patient was not interviewed or contacted during this encounter however LCSW collaborated with [facility].  Intervention:  Review of patient status, including review of consultants reports, relevant laboratory and other test results, and collaboration with appropriate care team members and the patient's provider was performed as part of comprehensive patient evaluation and provision of chronic care management services.   Goals Addressed            This Visit's Progress   . Client will have food insecurities addressed by [facility] [employer] [facility]   On track    [place] (see longitudinal plan of care for additional care plan information) Current Barriers & progress:  . Patient with HTN and chronic Hep C. needs community resources for food and coupons for Glucerna  . Patient reports she does not always feel like eating so she drinks Glucerna but not always able to afford it . Acknowledges deficits and needs support, education and care coordination in order to meet this unmet need  . Limited food benefits that don't last for the month . Limited income with purchasing food and supplemental drinks  . [facility] will contact patient Monday to schedule food delivery Clinical Goal(s)  . Over the next 30 days patient will be able to have food needs met as demonstrated by having food delivered by [facility] foodbox program and not having a fear of running out of food. . patient will work with [facility] to establish food delivery day and time   Interventions provided by LCSW:  . Assessed patient's care coordination needs and barriers . Provided patient with information about [facility] Foodbox program.  . Received verbal permission from patient to complete, and submit [facility] foodbox referral . Mailed  Glucerna coupons to patient . Collaborated with appropriate clinical care team members regarding patient needs Patient Self Care Activities & Deficits:  . Patient is unable to independently navigate community resource options without care coordination support  . Acknowledges deficits and is motivated to resolve concern  . Unable to perform ADLs independently has an aide that comes daily Please see past updates related to this goal by clicking on the "Past Updates" button in the selected goal       Plan: LCSW will F/U with patient in two weeks  [name], [place] / [place]   [phone] 10:31 AM

## 2020-02-14 ENCOUNTER — Ambulatory Visit: Payer: Self-pay | Admitting: Licensed Clinical Social Worker

## 2020-02-14 ENCOUNTER — Encounter: Payer: Self-pay | Admitting: Licensed Clinical Social Worker

## 2020-02-14 NOTE — Chronic Care Management (AMB) (Signed)
Clinical Social Work  Care Management Outreach   02/14/2020 Name: [name] MRN: [id number] DOB: 07-17-1956  [name] is a 64 y.o. year old female who is a primary care patient of [name], DO .  The Care Management team was consulted for assistance with Food Insecurity.   LCSW reached out to [name] today by phone to inform her food would be delivered this Friday.  The outreach was unsuccessful.  Phone rang busy.    Review of patient status, including review of consultants reports, relevant laboratory and other test results, and collaboration with appropriate care team members and the patient's provider was performed as part of comprehensive patient evaluation and provision of care management services.   Goals Addressed            This Visit's Progress   . Client will have food insecurities addressed by [facility]   On track    [place] (see longitudinal plan of care for additional care plan information) Current Barriers & progress:  . Patient with HTN and chronic Hep C. needs community resources for food and coupons for Glucerna  . Patient reports she does not always feel like eating so she drinks Glucerna but not always able to afford it . Acknowledges deficits and needs support, education and care coordination in order to meet this unmet need  . Limited food benefits that don't last for the month . Limited income with purchasing food and supplemental drinks  . [facility] unable to reach patient to schedule food delivery Clinical Goal(s)  . Over the next 30 days patient will be able to have food needs met as demonstrated by having food delivered by [facility] foodbox program and not having a fear of running out of food. . patient will work with [facility] to establish food delivery day and time   Interventions provided by LCSW:  . Collaborated with [facility] regarding patient food needs . Called patient to inform her of food delivery  unable to contact patient Patient Self Care Activities & Deficits:  . Patient is unable to independently navigate community resource options without care coordination support  . Acknowledges deficits and is motivated to resolve concern  . Unable to perform ADLs independently has an aide that comes daily Please see past updates related to this goal by clicking on the "Past Updates" button in the selected goal       Follow Up Plan: LCSW will continue to try to call patient.  [facility] has also not been able to contact patient.  [name], LCSW Clinical Social Worker [place] / [place]   [phone] 2:41 PM

## 2020-02-15 ENCOUNTER — Ambulatory Visit: Payer: Self-pay | Admitting: Licensed Clinical Social Worker

## 2020-02-15 DIAGNOSIS — Z139 Encounter for screening, unspecified: Secondary | ICD-10-CM

## 2020-02-15 DIAGNOSIS — Z7189 Other specified counseling: Secondary | ICD-10-CM

## 2020-02-16 NOTE — Chronic Care Management (AMB) (Signed)
Care Management   Clinical Social Work Follow Up   02/16/2020 Name: [name] MRN: [id number] DOB: 06/05/1956  Referred by: [name], DO  Reason for referral : Care Coordination (food delivery)  [name] is a 64 y.o. year old female who is a primary care patient of [name], DO.  Reason for follow-up: Phone encounter with patient today for ongoing assessment and brief interventions to assist with care coordination needs.   Assessment: Patient will receive her first food delivery from [facility] [employer] this Friday.  She has also been placed on the wait list for meals on wheels.  Review of patient status, including review of consultants reports, relevant laboratory and other test results, and collaboration with appropriate care team members and the patient's provider was performed as part of comprehensive patient evaluation and provision of care management services.   SDOH (Social Determinants of Health) assessments performed: Yes SDOH Interventions     Most Recent Value  SDOH Interventions  SDOH Interventions for the Following Domains  Food Insecurity  Food Insecurity Interventions  Other (Comment) [referral to Meals on wheels and food delivery with [facility] [employer] [facility]]      Goals Addressed            This Visit's Progress   . Client will have food insecurities addressed by [facility]       CARE PLAN ENTRY (see longitudinal plan of care for additional care plan information) Current Barriers & progress:  . Patient with HTN and chronic Hep C. needs community resources for food and coupons for Glucerna  . Patient reports she does not always feel like eating so she drinks Glucerna but not always able to afford it . Acknowledges deficits and needs support, education and care coordination in order to meet this unmet need  . Limited food benefits that don't last for the month . Limited income with purchasing food and supplemental drinks  . [facility] has contacted  patient and scheduled food delivery  . Patient is on the wait list for [employer] on wheels Clinical Goal(s)  . Over the next 30 days patient will be able to have food needs met as demonstrated by having food delivered by [facility] foodbox program and not having a fear of running out of food. . patient will work with [facility] to establish food delivery day and time   Interventions provided by LCSW:  . Collaborated with [facility] regarding patient food needs . Called patient to inform her of food delivery from [employer] . Also discussed meals on wheels. Received permission from patient to make referral  . [facility] to place patient on wait list ( anticipate 3 to 4 months) Patient Self Care Activities & Deficits:  . Patient is unable to independently navigate community resource options without care coordination support  . Acknowledges deficits and is motivated to resolve concern  . Unable to perform ADLs independently has an aide that comes daily Please see past updates related to this goal by clicking on the "Past Updates" button in the selected goal        Outpatient Encounter Medications as of 02/15/2020  Medication Sig  . albuterol (PROVENTIL HFA;VENTOLIN HFA) 108 (90 BASE) MCG/ACT inhaler Inhale 2 puffs into the lungs every 6 (six) hours as needed for wheezing. For wheezing  . amLODipine (NORVASC) 5 MG tablet Take 1 tablet (5 mg total) by mouth daily.  [name] anastrozole (ARIMIDEX) 1  MG tablet Take 1 tablet (1 mg total) by mouth daily.  [name] aspirin 81 MG EC tablet Take 81 mg by mouth daily.    [name] atorvastatin (LIPITOR) 20 MG tablet Take 1 tablet (20 mg total) by mouth daily.  . carvedilol (COREG) 6.25 MG tablet Take 1 tablet (6.25 mg total) by mouth 2 (two) times daily with a meal.  . clopidogrel (PLAVIX) 75 MG tablet Take 75 mg by mouth daily.   . Incontinence Supply Disposable (COMFORT SHIELD ADULT DIAPERS) MISC 1 each by Does not apply route every 4  (four) hours as needed.  [name] lisinopril (PRINIVIL,ZESTRIL) 40 MG tablet Take 1 tablet (40 mg total) by mouth daily.  . metoCLOPramide (REGLAN) 5 MG tablet Take 1 tablet (5 mg total) by mouth every 8 (eight) hours as needed for nausea.  . nitroGLYCERIN (NITROSTAT) 0.4 MG SL tablet Place 0.4 mg under the tongue every 5 (five) minutes as needed for chest pain.   [name] ondansetron (ZOFRAN) 4 MG tablet Take 1 tablet (4 mg total) by mouth every 8 (eight) hours as needed for nausea or vomiting.  . potassium chloride SA (K-DUR,KLOR-CON) 20 MEQ tablet Take 1 tablet (20 mEq total) by mouth daily.   Facility-Administered Encounter Medications as of 02/15/2020  Medication  . mitoMYcin (MUTAMYCIN) Injection Use in OR only (0.4 mg/ml)   Plan: LCSW will F/U with patient in 2 to 3 weeks  [name], [place] / [place]   [phone] 9:10 AM

## 2020-02-16 NOTE — Patient Instructions (Signed)
Licensed Clinical Social Worker Visit Information [name]  it was nice speaking with you. Please call me directly if you have questions [phone] Goals we discussed today:  Goals Addressed            This Visit's Progress   . Client will have food insecurities addressed by [facility]       CARE PLAN ENTRY (see longitudinal plan of care for additional care plan information) Current Barriers & progress:  . Patient with HTN and chronic Hep C. needs community resources for food and coupons for Glucerna  . Patient reports she does not always feel like eating so she drinks Glucerna but not always able to afford it . Acknowledges deficits and needs support, education and care coordination in order to meet this unmet need  . Limited food benefits that don't last for the month . Limited income with purchasing food and supplemental drinks  . [facility] has contacted patient and scheduled food delivery  . Patient is on the wait list for [employer] on wheels Clinical Goal(s)  . Over the next 30 days patient will be able to have food needs met as demonstrated by having food delivered by [facility] foodbox program and not having a fear of running out of food. . patient will work with [facility] to establish food delivery day and time   Interventions provided by LCSW:  . Collaborated with [facility] regarding patient food needs . Called patient to inform her of food delivery from [facility] [employer] . Also discussed meals on wheels. Received permission from patient to make referral  . [facility] to place patient on wait list ( anticipate 3 to 4 months) Patient Self Care Activities & Deficits:  . Patient is unable to independently navigate community resource options without care coordination support  . Acknowledges deficits and is motivated to resolve concern  . Unable to perform ADLs independently has an aide that comes daily Please see past updates  related to this goal by clicking on the "Past Updates" button in the selected goal       Materials provided: Verbal education about meals on wheels provided by phone [name] received Care Management services today:  1. Care Management services include personalized support from designated clinical staff supervised by her physician, including individualized plan of care and coordination with other care providers 2. 24/7 contact [phone] for assistance for urgent and routine care needs. 3. Care Management are voluntary services and be declined at any time by calling the office.  Patient  verbally agreed to assistance and services provided by embedded care coordination/care management team today.   Patient verbalizes understanding of instructions provided today.  Follow up plan:  SW will follow up with patient by phone over the next 2 to 3 weeks  Maurine Cane, LCSW

## 2020-02-29 ENCOUNTER — Ambulatory Visit: Payer: Medicare Other | Admitting: Licensed Clinical Social Worker

## 2020-02-29 ENCOUNTER — Other Ambulatory Visit: Payer: Self-pay

## 2020-02-29 ENCOUNTER — Encounter: Payer: Self-pay | Admitting: Licensed Clinical Social Worker

## 2020-02-29 DIAGNOSIS — Z7189 Other specified counseling: Secondary | ICD-10-CM

## 2020-02-29 NOTE — Patient Instructions (Signed)
Licensed Clinical Social Worker Visit Information [name]  it was nice speaking with you. Please call me directly if you have questions [phone] Goals we discussed today: scheduling your covid vaccine  Goals Addressed            This Visit's Progress   . COMPLETED: Client will have food insecurities addressed by [facility]       CARE PLAN ENTRY (see longitudinal plan of care for additional care plan information) Current Barriers & progress:  . Patient with HTN and chronic Hep C. needs community resources for food and coupons for Glucerna  . Patient reports she does not always feel like eating so she drinks Glucerna but not always able to afford it . Acknowledges deficits and needs support, education and care coordination in order to meet this unmet need  . Limited food benefits that don't last for the month . Limited income with purchasing food and supplemental drinks  . [facility] has delivered food to patient and scheduled monthly drop off time . Patient is on the wait list for [employer] on wheels Clinical Goal(s)  . Over the next 30 days patient will be able to have food needs met as demonstrated by having food delivered by [facility] foodbox program and not having a fear of running out of food. . patient will work with [facility] to establish food delivery day and time   Interventions provided by LCSW:  . Collaborated with [facility] regarding patient food needs . Called patient to inform her of food delivery from [facility] [employer] . Also discussed meals on wheels. Received permission from patient to make referral  . [facility] to place patient on wait list ( anticipate 3 to 4 months) Patient Self Care Activities & Deficits:  . Patient is unable to independently navigate community resource options without care coordination support  . Acknowledges deficits and is motivated to resolve concern  . Unable to perform ADLs independently has  an aide that comes daily Please see past updates related to this goal by clicking on the "Past Updates" button in the selected goal      . I would like to get Covid shot (pt-stated)   On track    [place] (see longitudinal plan of care for additional care plan information)  Current Barriers:  . Acknowledges deficits and needs support, education and care coordination in order to schedule Covid vaccine . Limited social support . Literacy concerns . Lacks knowledge of community resource: as to where to go and how to sign up Clinical Goal(s):  Over the next 45 days, patient will receive both Covid vaccines and be able to feel safely without fear of getting Covid Interventions: . Assessed patient needs, what she has done and current barriers . With patient on the phone and LCSW was able to schedule vaccine appointment Sat. April 17th at 11:00 [place] . Assessed transportation barriers to appointment Patient Self Care Activities: . Patient is unable to independently navigate scheduling Covid vaccine without care coordination support  Initial goal documentation      . Patient needs Advance Directives   On track    [name] (see longtitudinal plan of care for additional care plan information)  Current Barriers  Progress:  . Patient with HTN and chronic Hep C. does not have an Advance Directive . Acknowledges deficits, education and support in order to complete this document . Limited education  about the importance of naming a healthcare power of attorney . Received advance directive package unable to read it due to poor eye sight. Will get aide and family to assist her Clinical Social Work Goal(s):  [name] Over the next 30 days, the patient will review mailed EMMI education on Advance Directive as evidenced by patient self report of review . Over the next 45 days, with assistance of LCSW, the patient will complete mailed Advance Directive packet, have notarized and  provide a copy to provider office Interventions provided by LCSW: . Mailed the patient 2nd EMMI educational handout on Advance Directives as well as an Forensic scientist packet . Advised patient to review information mailed by LCSW . Will provided education and assistance to client regarding Advanced Directives. Patient Self Care Activities:  . Is unable to complete documentation independently . Able to identify next of kin or [place] . Patient will review the mailed document  Please see past updates related to this goal by clicking on the "Past Updates" button in the selected goal       Materials provided Ms. Nori Riis received Care Management services today:  1. Care Management services include personalized support from designated clinical staff supervised by her physician, including individualized plan of care and coordination with other care providers 2. 24/7 contact [phone] for assistance for urgent and routine care needs. 3. Care Management are voluntary services and be declined at any time by calling the office. Patient verbalizes understanding of instructions provided today.  Follow up plan:  SW will follow up with patient by phone over the next 4 to 5 weeks  Maurine Cane, LCSW

## 2020-02-29 NOTE — Chronic Care Management (AMB) (Signed)
Care Management   Clinical Social Work Follow Up   02/29/2020 Name: [name] MRN: [id number] DOB: 11/22/1955  Referred by: [name], DO  Reason for referral : Care Coordination ([place] F/U)  [name] is a 64 y.o. year old female who is a primary care patient of [name], DO.  Reason for follow-up: Phone encounter with patient today for ongoing assessment and brief interventions to assist with care coordination needs.   Assessment: Patient is making progress towards completing previous goals.  States she would like to get the covid vaccine but does not know what to do or where to start. LCSW assisted patient with setting up appointment.  Review of patient status, including review of consultants reports, relevant laboratory and other test results, and collaboration with appropriate care team members and the patient's provider was performed as part of comprehensive patient evaluation and provision of care management services.    Advance Directive Status: N See Care Plan  for related entries. SDOH (Social Determinants of Health) assessments performed: Yes   Goals Addressed            This Visit's Progress   . COMPLETED: Client will have food insecurities addressed by [facility]       CARE PLAN ENTRY (see longitudinal plan of care for additional care plan information) Current Barriers & progress:  . Patient with HTN and chronic Hep C. needs community resources for food and coupons for Glucerna  . Patient reports she does not always feel like eating so she drinks Glucerna but not always able to afford it . Acknowledges deficits and needs support, education and care coordination in order to meet this unmet need  . Limited food benefits that don't last for the month . Limited income with purchasing food and supplemental drinks  . [facility] has delivered food to patient and scheduled monthly drop off time . Patient is on the wait list for [employer]  on wheels Clinical Goal(s)  . Over the next 30 days patient will be able to have food needs met as demonstrated by having food delivered by [facility] foodbox program and not having a fear of running out of food. . patient will work with [facility] to establish food delivery day and time   Interventions provided by LCSW:  . Collaborated with [facility] regarding patient food needs . Called patient to inform her of food delivery from [facility] . Also discussed meals on wheels. Received permission from patient to make referral  . [facility] to place patient on wait list ( anticipate 3 to 4 months) Patient Self Care Activities & Deficits:  . Patient is unable to independently navigate community resource options without care coordination support  . Acknowledges deficits and is motivated to resolve concern  . Unable to perform ADLs independently has an aide that comes daily Please see past updates related to this goal by clicking on the "Past Updates" button in the selected goal      . I would like to get Covid shot (pt-stated)       [place] (see longitudinal plan of care for additional care plan information)  Current Barriers:  . Acknowledges deficits and needs support, education and care coordination in order to schedule Covid vaccine . Limited social support . Literacy concerns . Lacks knowledge of community resource: as to where to go and how to sign up Clinical Goal(s):  Over the next 45  days, patient will receive both Covid vaccines and be able to feel safely without fear of getting Covid Interventions: . Assessed patient needs, what she has done and current barriers . With patient on the phone and LCSW was able to schedule vaccine appointment Sat. April 17th at 11:00 [place] . Assessed transportation barriers to appointment Patient Self Care Activities: . Patient is unable to independently navigate scheduling Covid vaccine without care  coordination support  Initial goal documentation      . Patient needs Advance Directives   On track    [place] (see longtitudinal plan of care for additional care plan information)  Current Barriers  Progress:  . Patient with HTN and chronic Hep C. does not have an Advance Directive . Acknowledges deficits, education and support in order to complete this document . Limited education about the importance of naming a healthcare power of attorney . Received advance directive package unable to read it due to poor eye sight. Will get aide and family to assist her Clinical Social Work Goal(s):  [name] Over the next 30 days, the patient will review mailed EMMI education on Advance Directive as evidenced by patient self report of review . Over the next 45 days, with assistance of LCSW, the patient will complete mailed Advance Directive packet, have notarized and provide a copy to provider office Interventions provided by LCSW: . Mailed the patient 2nd EMMI educational handout on Advance Directives as well as an Forensic scientist packet . Advised patient to review information mailed by LCSW . Will provided education and assistance to client regarding Advanced Directives. Patient Self Care Activities:  . Is unable to complete documentation independently . Able to identify next of kin or [place] . Patient will review the mailed document  Please see past updates related to this goal by clicking on the "Past Updates" button in the selected goal        Outpatient Encounter Medications as of 02/29/2020  Medication Sig  . albuterol (PROVENTIL HFA;VENTOLIN HFA) 108 (90 BASE) MCG/ACT inhaler Inhale 2 puffs into the lungs every 6 (six) hours as needed for wheezing. For wheezing  . amLODipine (NORVASC) 5 MG tablet Take 1 tablet (5 mg total) by mouth daily.  [name] anastrozole (ARIMIDEX) 1 MG tablet Take 1 tablet (1 mg total) by mouth daily.  [name] aspirin 81 MG EC tablet  Take 81 mg by mouth daily.    [name] atorvastatin (LIPITOR) 20 MG tablet Take 1 tablet (20 mg total) by mouth daily.  . carvedilol (COREG) 6.25 MG tablet Take 1 tablet (6.25 mg total) by mouth 2 (two) times daily with a meal.  . clopidogrel (PLAVIX) 75 MG tablet Take 75 mg by mouth daily.   . Incontinence Supply Disposable (COMFORT SHIELD ADULT DIAPERS) MISC 1 each by Does not apply route every 4 (four) hours as needed.  [name] lisinopril (PRINIVIL,ZESTRIL) 40 MG tablet Take 1 tablet (40 mg total) by mouth daily.  . metoCLOPramide (REGLAN) 5 MG tablet Take 1 tablet (5 mg total) by mouth every 8 (eight) hours as needed for nausea.  . nitroGLYCERIN (NITROSTAT) 0.4 MG SL tablet Place 0.4 mg under the tongue every 5 (five) minutes as needed for chest pain.   [name] ondansetron (ZOFRAN) 4 MG tablet Take 1 tablet (4 mg total) by mouth every 8 (eight) hours as needed for nausea or vomiting.  . potassium chloride SA (K-DUR,KLOR-CON) 20 MEQ tablet Take 1 tablet (20 mEq  total) by mouth daily.   Facility-Administered Encounter Medications as of 02/29/2020  Medication  . mitoMYcin (MUTAMYCIN) Injection Use in OR only (0.4 mg/ml)   Plan: LCSW will F/U with patient in 4 to5  weeks   [name], [place] / [place]   [phone] 10:13 AM

## 2020-03-01 ENCOUNTER — Ambulatory Visit: Payer: Self-pay | Admitting: Licensed Clinical Social Worker

## 2020-03-01 NOTE — Chronic Care Management (AMB) (Signed)
Care Management   Clinical Social Work Follow Up   03/01/2020 Name: [name] MRN: [id number] DOB: 1956-01-05  Referred by: [name]  Reason for referral : [name] is a 64 y.o. year old female who is a primary care patient of [name].  Reason for follow-up: Phone encounter with patient today to make sure she had a ride for her covid  Vaccine appointment.  Also assessed need for more Glucerna coupons.  coupons mailed    Review of patient status, including review of consultants reports, relevant laboratory and other test results, and collaboration with appropriate care team members and the patient's provider was performed as part of comprehensive patient evaluation and provision of care management services.    Goals Addressed            This Visit's Progress   . I would like to get Covid shot (pt-stated)   On track    [place] (see longitudinal plan of care for additional care plan information)  Current Barriers & Progress:  . Acknowledges deficits and needs support, education and care coordination in order to schedule Covid vaccine . Limited social support . Literacy concerns . Lacks knowledge of community resource: as to where to go and how to sign up Clinical Goal(s):  Over the next 45 days, patient will receive both Covid vaccines and be able to feel safely without fear of getting Covid Interventions: . Assessed patient needs, what she has done and current barriers . With patient on the phone and LCSW was able to schedule vaccine appointment Sat. April 17th at 11:00 [place] . Assessed transportation barriers to appointment . Called to remind patient of appointment, made sure she wrote it down and discussed transportation  Patient Self Care Activities: . Patient is unable to independently navigate scheduling Covid vaccine without care coordination support  Please see past updates related to this goal by clicking  on the "Past Updates" button in the selected goal        Outpatient Encounter Medications as of 03/01/2020  Medication Sig  . albuterol (PROVENTIL HFA;VENTOLIN HFA) 108 (90 BASE) MCG/ACT inhaler Inhale 2 puffs into the lungs every 6 (six) hours as needed for wheezing. For wheezing  . amLODipine (NORVASC) 5 MG tablet Take 1 tablet (5 mg total) by mouth daily.  [name] anastrozole (ARIMIDEX) 1 MG tablet Take 1 tablet (1 mg total) by mouth daily.  [name] aspirin 81 MG EC tablet Take 81 mg by mouth daily.    [name] atorvastatin (LIPITOR) 20 MG tablet Take 1 tablet (20 mg total) by mouth daily.  . carvedilol (COREG) 6.25 MG tablet Take 1 tablet (6.25 mg total) by mouth 2 (two) times daily with a meal.  . clopidogrel (PLAVIX) 75 MG tablet Take 75 mg by mouth daily.   . Incontinence Supply Disposable (COMFORT SHIELD ADULT DIAPERS) MISC 1 each by Does not apply route every 4 (four) hours as needed.  [name] lisinopril (PRINIVIL,ZESTRIL) 40 MG tablet Take 1 tablet (40 mg total) by mouth daily.  . metoCLOPramide (REGLAN) 5 MG tablet Take 1 tablet (5 mg total) by mouth every 8 (eight) hours as needed for nausea.  . nitroGLYCERIN (NITROSTAT) 0.4 MG SL tablet Place 0.4 mg under the tongue every 5 (five) minutes as needed for chest pain.   [name] ondansetron (ZOFRAN) 4 MG tablet Take 1 tablet (4 mg total) by mouth every 8 (eight) hours as needed for nausea or  vomiting.  . potassium chloride SA (K-DUR,KLOR-CON) 20 MEQ tablet Take 1 tablet (20 mEq total) by mouth daily.   Facility-Administered Encounter Medications as of 03/01/2020  Medication  . mitoMYcin (MUTAMYCIN) Injection Use in OR only (0.4 mg/ml)   Plan: LCSW will F/U with patient in 4 to 6 weeks  [name], [name] / [name]   [phone] 11:42 AM

## 2020-03-03 ENCOUNTER — Ambulatory Visit: Payer: Medicare Other

## 2020-03-28 ENCOUNTER — Ambulatory Visit: Payer: Medicare Other | Admitting: Licensed Clinical Social Worker

## 2020-03-28 DIAGNOSIS — Z789 Other specified health status: Secondary | ICD-10-CM

## 2020-03-28 DIAGNOSIS — Z7189 Other specified counseling: Secondary | ICD-10-CM

## 2020-03-28 NOTE — Chronic Care Management (AMB) (Signed)
Care Management   Clinical Social Work Follow Up   03/28/2020 Name: [name] MRN: [id number] DOB: Jan 19, 1956  Referred by: [name], DO  Reason for referral : Care Coordination (F/U)  [name] is a 64 y.o. year old female who is a primary care patient of [name], DO.  Reason for follow-up: Phone encounter with patient today for ongoing assessment and brief interventions to assist with care coordination needs.   Assessment: Patient continues to experience some difficulty with managing from day to day with limited support system.  Her aide continues to assist her daily at 11:00.  continues to get food delivered from [employer], is on wait list for meals on wheels. Patient also requested more Glucerna coupons. ( LCSW will mail several).  Recommendation: Patient may benefit from, and is in agreement to explore the PACE program for additional in-home support.  Referral placed.  Phone appointment for patient to talk to [place] [phone]) scheduled Friday at 11:00.  Patient will receive first hand information and decided it this program is a good fit for her.   Review of patient status, including review of consultants reports, relevant laboratory and other test results, and collaboration with appropriate care team members and the patient's provider was performed as part of comprehensive patient evaluation and provision of care management services.    Advance Directive Status: N See Care Plan or related entries. SDOH (Social Determinants of Health) assessments performed: Yes  Goals Addressed            This Visit's Progress   . I would like to get Covid shot (pt-stated)   Not on track    [place] (see longitudinal plan of care for additional care plan information)  Current Barriers & Progress:  . Acknowledges deficits and needs support, education and care coordination in order to schedule Covid vaccine . Limited social support . Literacy  concerns . Lacks knowledge of community resource: as to where to go and how to sign up . Patient did not get vaccine, stated transportation barriers, does not want to make another appointment at this time Clinical Goal(s):  Over the next 45 days, patient will receive both Covid vaccines and be able to feel safely without fear of getting Covid Interventions: . Assessed patient needs and barriers to appointment . Assessed transportation barriers to appointment . Called to remind patient of appointment, made sure she wrote it down and discussed transportation  Patient Self Care Activities: . Patient is unable to independently navigate scheduling Covid vaccine without care coordination support  Please see past updates related to this goal by clicking on the "Past Updates" button in the selected goal      . Patient needs Advance Directives   Not on track    [place] (see longtitudinal plan of care for additional care plan information)  Current Barriers  Progress:  . Patient with HTN and chronic Hep C. does not have an Advance Directive . Acknowledges deficits, education and support in order to complete this document . Limited education about the importance of naming a healthcare power of attorney . Received advance directive package unable to read it due to poor eye sight. Will get aide and family to assist her . Has  given information to her sister to help her, will discuss at a later date Clinical Social Work Goal(s):  [name] Over the next 30 days, the patient will review mailed EMMI education on Advance Directive as evidenced  by patient self report of review . Over the next 45 days, with assistance of LCSW, the patient will complete mailed Advance Directive packet, have notarized and provide a copy to provider office Interventions provided by LCSW: . Mailed the patient 2nd EMMI educational handout on Advance Directives as well as an Forensic scientist packet . Advised patient to review  information mailed by LCSW . provided education regarding Advanced Directives. Patient Self Care Activities:  . Is unable to complete documentation independently . Able to identify next of kin or [place] . Patient will review the mailed document  Please see past updates related to this goal by clicking on the "Past Updates" button in the selected goal        Outpatient Encounter Medications as of 03/28/2020  Medication Sig  . albuterol (PROVENTIL HFA;VENTOLIN HFA) 108 (90 BASE) MCG/ACT inhaler Inhale 2 puffs into the lungs every 6 (six) hours as needed for wheezing. For wheezing  . amLODipine (NORVASC) 5 MG tablet Take 1 tablet (5 mg total) by mouth daily.  [name] anastrozole (ARIMIDEX) 1 MG tablet Take 1 tablet (1 mg total) by mouth daily.  [name] aspirin 81 MG EC tablet Take 81 mg by mouth daily.    [name] atorvastatin (LIPITOR) 20 MG tablet Take 1 tablet (20 mg total) by mouth daily.  . carvedilol (COREG) 6.25 MG tablet Take 1 tablet (6.25 mg total) by mouth 2 (two) times daily with a meal.  . clopidogrel (PLAVIX) 75 MG tablet Take 75 mg by mouth daily.   . Incontinence Supply Disposable (COMFORT SHIELD ADULT DIAPERS) MISC 1 each by Does not apply route every 4 (four) hours as needed.  [name] lisinopril (PRINIVIL,ZESTRIL) 40 MG tablet Take 1 tablet (40 mg total) by mouth daily.  . metoCLOPramide (REGLAN) 5 MG tablet Take 1 tablet (5 mg total) by mouth every 8 (eight) hours as needed for nausea.  . nitroGLYCERIN (NITROSTAT) 0.4 MG SL tablet Place 0.4 mg under the tongue every 5 (five) minutes as needed for chest pain.   [name] ondansetron (ZOFRAN) 4 MG tablet Take 1 tablet (4 mg total) by mouth every 8 (eight) hours as needed for nausea or vomiting.  . potassium chloride SA (K-DUR,KLOR-CON) 20 MEQ tablet Take 1 tablet (20 mEq total) by mouth daily.   Facility-Administered Encounter Medications as of 03/28/2020  Medication  . mitoMYcin (MUTAMYCIN) Injection Use in OR only  (0.4 mg/ml)   Plan: [place] [facility] will call LCSW to provide an update after her encounter with patient LCSW will F/U based on encounter and ongoing need.  [name], [place] / [place]   [phone] 1:46 PM

## 2020-03-28 NOTE — Patient Instructions (Signed)
Licensed Clinical Social Worker Visit Information Ms. Kimbel  it was nice speaking with you. Please call me directly if you have questions [phone] Goals we discussed today:  Goals Addressed            This Visit's Progress   . I would like to get Covid shot (pt-stated)   Not on track    [place] (see longitudinal plan of care for additional care plan information)  Current Barriers & Progress:  . Acknowledges deficits and needs support, education and care coordination in order to schedule Covid vaccine . Limited social support . Literacy concerns . Lacks knowledge of community resource: as to where to go and how to sign up . Patient did not get vaccine, stated transportation barriers, does not want to make another appointment at this time Clinical Goal(s):  Over the next 45 days, patient will receive both Covid vaccines and be able to feel safely without fear of getting Covid Interventions: . Assessed patient needs and barriers to appointment . Assessed transportation barriers to appointment . Called to remind patient of appointment, made sure she wrote it down and discussed transportation  Patient Self Care Activities: . Patient is unable to independently navigate scheduling Covid vaccine without care coordination support  Please see past updates related to this goal by clicking on the "Past Updates" button in the selected goal      . Patient needs Advance Directives   Not on track    [place] (see longtitudinal plan of care for additional care plan information)  Current Barriers  Progress:  . Patient with HTN and chronic Hep C. does not have an Advance Directive . Acknowledges deficits, education and support in order to complete this document . Limited education about the importance of naming a healthcare power of attorney . Received advance directive package unable to read it due to poor eye sight. Will get aide and family to assist her . Has  given  information to her sister to help her, will discuss at a later date Clinical Social Work Goal(s):  [name] Over the next 30 days, the patient will review mailed EMMI education on Advance Directive as evidenced by patient self report of review . Over the next 45 days, with assistance of LCSW, the patient will complete mailed Advance Directive packet, have notarized and provide a copy to provider office Interventions provided by LCSW: . Mailed the patient 2nd EMMI educational handout on Advance Directives as well as an Forensic scientist packet . Advised patient to review information mailed by LCSW . provided education regarding Advanced Directives. Patient Self Care Activities:  . Is unable to complete documentation independently . Able to identify next of kin or [place] . Patient will review the mailed document  Please see past updates related to this goal by clicking on the "Past Updates" button in the selected goal       Materials provided: Verbal education about PACE program provided by phone Ms. Nori Riis received Care Management services today:  1. Care Management services include personalized support from designated clinical staff supervised by her physician, including individualized plan of care and coordination with other care providers 2. 24/7 contact [phone] for assistance for urgent and routine care needs. 3. Care Management are voluntary services and be declined at any time by calling the office.  Patient verbally agreed to assistance and services provided by embedded care coordination/care management team today.   Patient verbalizes understanding of instructions  provided today.  Follow up plan:  SW will follow up with patient by phone over the next 2 to 3 weeks  Maurine Cane, LCSW

## 2020-03-30 ENCOUNTER — Other Ambulatory Visit: Payer: Self-pay

## 2020-03-30 ENCOUNTER — Telehealth: Payer: Medicare Other

## 2020-03-30 ENCOUNTER — Ambulatory Visit: Payer: Self-pay | Admitting: Licensed Clinical Social Worker

## 2020-03-30 NOTE — Chronic Care Management (AMB) (Signed)
   Social Work  Care Management Collaboration 03/30/2020 Name: [name] MRN: [id number] DOB: 12-01-1955  [name] is a 64 y.o. year old female who sees Morgan Sires, [name] for primary care.   Intervention: Patient was not interviewed or contacted during this encounter.  LCSW collaborated with [employer]. She completed information session with patient today about the PACE program.  [facility]ACE will F/U with patient in the next week and also provide LCSW an update.  Review of patient status, including review of consultants reports, relevant laboratory and other test results, and collaboration with appropriate care team members and the patient's provider was performed as part of comprehensive patient evaluation and provision of chronic care management services.    Plan: LCSW will F/U with patient after F/U encounter with [facility]ACE intake worker  [name], [place] / [place]   [phone] 12:40 PM

## 2020-04-03 ENCOUNTER — Ambulatory Visit: Payer: Self-pay | Admitting: Licensed Clinical Social Worker

## 2020-04-03 NOTE — Chronic Care Management (AMB) (Signed)
   Social Work  Care Management Collaboration 04/03/2020 Name: [name] MRN: [id number] DOB: 04-29-1956  [name] is a 64 y.o. year old female who sees [name], DO for primary care.   LCSW received phone call from [place] with PACE program to provide an update on encounter with patient.  Patient is open to talking with her further about the PACE program but not making a commitment as of today.  Patient's main concerns are who would be her PCP and if she is able to keep her current aide.     Intervention: Patient was not interviewed or contacted during this encounter.  LCSW collaborated with care Freight forwarder at [employer] and mailed Glucerna coupons to patient as per her request. Review of patient status, including review of consultants reports, relevant laboratory and other test results, and collaboration with appropriate care team members and the patient's provider was performed as part of comprehensive patient evaluation and provision of chronic care management services.   Plan:  1. PACE case manager will F/U with patient in one week 2. LCSW will continue to collaborate patient's needs and care coordination  [name], [place] / [place]   [phone] 10:56 AM

## 2020-04-04 ENCOUNTER — Ambulatory Visit: Payer: Self-pay | Admitting: Licensed Clinical Social Worker

## 2020-04-04 NOTE — Chronic Care Management (AMB) (Signed)
   Social Work  Care Management Collaboration 04/04/2020 Name: [name] MRN: [id number] DOB: Oct 05, 1956  [name] is a 64 y.o. year old female who sees [name], DO for primary care.   Intervention: Patient was not interviewed or contacted during this encounter.  LCSW collaborated with [facility]ACE case Multimedia programmer ref. Patient transitioning to the PACE program.  Review of patient status, including review of consultants reports, relevant laboratory and other test results, and collaboration with appropriate care team members and the patient's provider was performed as part of comprehensive patient evaluation and provision of chronic care management services.    Plan: LCSW will continue to follow patient.  Will reachout to her after patient has made her decision about the transisiton.  [name], [place] / [place]   [phone] 3:53 PM

## 2020-04-10 ENCOUNTER — Other Ambulatory Visit: Payer: Self-pay

## 2020-04-10 ENCOUNTER — Emergency Department (HOSPITAL_COMMUNITY): Payer: Medicare Other

## 2020-04-10 ENCOUNTER — Encounter (HOSPITAL_COMMUNITY): Payer: Self-pay

## 2020-04-10 ENCOUNTER — Emergency Department (HOSPITAL_COMMUNITY)
Admission: EM | Admit: 2020-04-10 | Discharge: 2020-04-11 | Disposition: A | Discharge status: Home / Self Care | Payer: Medicare Other | Attending: Emergency Medicine | Admitting: Emergency Medicine

## 2020-04-10 DIAGNOSIS — F17210 Nicotine dependence, cigarettes, uncomplicated: Secondary | ICD-10-CM | POA: Diagnosis not present

## 2020-04-10 DIAGNOSIS — Y9389 Activity, other specified: Secondary | ICD-10-CM | POA: Insufficient documentation

## 2020-04-10 DIAGNOSIS — W010XXA Fall on same level from slipping, tripping and stumbling without subsequent striking against object, initial encounter: Secondary | ICD-10-CM | POA: Diagnosis not present

## 2020-04-10 DIAGNOSIS — W19XXXA Unspecified fall, initial encounter: Secondary | ICD-10-CM

## 2020-04-10 DIAGNOSIS — M1990 Unspecified osteoarthritis, unspecified site: Secondary | ICD-10-CM | POA: Diagnosis not present

## 2020-04-10 DIAGNOSIS — Y929 Unspecified place or not applicable: Secondary | ICD-10-CM | POA: Diagnosis not present

## 2020-04-10 DIAGNOSIS — M25552 Pain in left hip: Secondary | ICD-10-CM

## 2020-04-10 DIAGNOSIS — Y999 Unspecified external cause status: Secondary | ICD-10-CM | POA: Insufficient documentation

## 2020-04-10 MED ORDER — HYDROCODONE-ACETAMINOPHEN 5-325 MG PO TABS
1.0000 | ORAL_TABLET | Freq: Once | ORAL | Status: AC
  Administered 2020-04-10: 1 via ORAL
  Filled 2020-04-10: qty 1

## 2020-04-10 NOTE — ED Provider Notes (Addendum)
[facility] EMERGENCY DEPARTMENT Provider Note   CSN: [id number] Arrival date & time: 04/10/20  2053   Time seen 11:17 PM (patient not at room at 11:05 PM)  History Chief Complaint  Patient presents with  . Hip Pain    [name] is a 64 y.o. female.  HPI   Patient states about 6 PM today she was coming out of a store and she tripped over a speed bump and fell onto her left hip.  She states she "tapped" her head on the left but she did not have loss of consciousness.  She denies headache or neck pain.  She states after she fell she was unable to bear weight on her left leg due to pain in her hip.  She went home and took a shower because she had urinary incontinence when she fell.  However due to the fact she was still unable to walk she came to the ED.  Patient states she uses a walker or a 4 pronged cane normally to ambulate.  PCP [name], DO   Past Medical History:  Diagnosis Date  . Adjustment disorder with depressed mood 01/11/2009  . Arthritis   . Asthma   . Breast cancer ([place])    left breast  . CAD 07/10/2008  . Chronic back pain   . COCAINE DEPENDENCY NOS 01/14/2007   stopped Cocaine 15 years   . CONVULSIONS, SEIZURES, NOS 01/14/2007   last seizure May, 2016  . COPD (chronic obstructive pulmonary disease) ([place])   . Coronary artery disease 01/14/2012  . CVA 01/14/2007  . Difficulty seeing    patient has glaucoma  . Family history of anesthesia complication [id number]   Pts sister (105 years old) died after having anesthesia - induced a heart attcks  . Glaucoma of both eyes   . Headache(784.0)   . HEPATITIS C 02/26/2009  . Hepatitis C   . Hyperlipemia   . HYPERTENSION, BENIGN SYSTEMIC 01/14/2007  . Memory loss    d/t CVA  . MI (myocardial infarction) ([place]) 12/20/2007   had stent placed  . Pneumonia    hx of  . PTSD (post-traumatic stress disorder)   . PTSD (post-traumatic stress disorder)   . Right sided weakness    d/t stroke  . Sciatica   . Stroke [facility])    right side weakness  . TOBACCO DEPENDENCE 01/14/2007    Patient Active Problem List   Diagnosis Date Noted  . Bradycardia   . Marijuana abuse   . Syncope 10/23/2018  . Hypokalemia 10/23/2018  . Chronic nausea 05/31/2018  . Malignant neoplasm of overlapping sites of left breast in female, estrogen receptor positive ([place]) 08/19/2017  . Ductal carcinoma in situ (DCIS) of left breast 08/04/2017  . Liver fibrosis 11/20/2015  . Chronic hepatitis C without hepatic coma ([place]) 07/25/2014  . Hyperkalemia 05/30/2014  . Back pain 05/30/2014  . Flank pain 12/02/2012  . Hip pain, left 05/27/2012  . Vision changes 04/08/2012  . Healthcare maintenance 12/15/2011  . Hyperlipidemia 12/13/2011  . ADJUSTMENT DISORDER WITH DEPRESSED MOOD 01/11/2009  . MILD COGNITIVE IMPAIRMENT SO STATED 07/10/2008  . Coronary atherosclerosis 07/10/2008  . TOBACCO DEPENDENCE 01/14/2007  . HYPERTENSION, BENIGN SYSTEMIC 01/14/2007  . CVA 01/14/2007  . CONVULSIONS, SEIZURES, NOS 01/14/2007    Past Surgical History:  Procedure Laterality Date  . BREAST LUMPECTOMY Left 09/18/2017   Procedure: EVACUATION HEMATOMA  LEFT BREAST;  Surgeon: [name], MD;  Location: [place];  Service: General;  Laterality: Left;  .  CATARACT EXTRACTION W/PHACO Right 07/11/2015   Procedure: CATARACT EXTRACTION PHACO AND INTRAOCULAR LENS PLACEMENT ([place]) RIGHT,INTERNAL PLEB REVISION;  Surgeon: Marylynn Pearson, MD;  Location: [place];  Service: Ophthalmology;  Laterality: Right;  . CESAREAN SECTION     x 2  . COLONOSCOPY W/ POLYPECTOMY    . CORONARY ANGIOPLASTY WITH STENT PLACEMENT  2009   1 stent  . EYE SURGERY    . MASTECTOMY W/ SENTINEL NODE BIOPSY Left 09/08/2017   Procedure: LEFT MASTECTOMY WITH SENTINEL LYMPH NODE BIOPSY;  Surgeon: [name], MD;  Location: [place];  Service: General;  Laterality: Left;  . MITOMYCIN C APPLICATION Right [id number]   Procedure: MITOMYCIN C APPLICATION RIGHT EYE;  Surgeon: Marylynn Pearson, MD;   Location: [place];  Service: Ophthalmology;  Laterality: Right;  . TONSILLECTOMY    . TRABECULECTOMY Left 06/29/2013   Procedure: TRABECULECTOMY WITH MYTOMICIN C LEFT EYE;  Surgeon: Marylynn Pearson, MD;  Location: [place];  Service: Ophthalmology;  Laterality: Left;  . TRABECULECTOMY Right 09/07/2013   Procedure: TRABECULECTOMY RIGHT EYE;  Surgeon: Marylynn Pearson, MD;  Location: [place];  Service: Ophthalmology;  Laterality: Right;     OB History   No obstetric history on file.     Family History  Problem Relation Age of Onset  . Stroke Mother   . Colon cancer Paternal Uncle   . Heart disease Sister   . Colon cancer Paternal Aunt     Social History   Tobacco Use  . Smoking status: Current Some Day Smoker    Packs/day: 0.25    Years: 47.00    Pack years: 11.75    Types: Cigarettes  . Smokeless tobacco: Never Used  Substance Use Topics  . Alcohol use: No    Comment: quit 4 years ago  . Drug use: No    Comment: last use of cocaine 20 years ago  lives at home Son stays with her  Home Medications Prior to Admission medications   Medication Sig Start Date End Date Taking? Authorizing Provider  albuterol (PROVENTIL HFA;VENTOLIN HFA) 108 (90 BASE) MCG/ACT inhaler Inhale 2 puffs into the lungs every 6 (six) hours as needed for wheezing. For wheezing    [provider]  amLODipine (NORVASC) 5 MG tablet Take 1 tablet (5 mg total) by mouth daily. 10/24/18   Barton Dubois, MD  anastrozole (ARIMIDEX) 1 MG tablet Take 1 tablet (1 mg total) by mouth daily. 12/24/18   [name], MD  aspirin 81 MG EC tablet Take 81 mg by mouth daily.      [provider]  atorvastatin (LIPITOR) 20 MG tablet Take 1 tablet (20 mg total) by mouth daily. 12/26/19   [name], MD  carvedilol (COREG) 6.25 MG tablet Take 1 tablet (6.25 mg total) by mouth 2 (two) times daily with a meal. 12/26/19   [name], MD  clopidogrel (PLAVIX) 75 MG tablet Take 75 mg by mouth daily.     [provider]  lisinopril (PRINIVIL,ZESTRIL) 40 MG tablet Take 1 tablet (40 mg total) by mouth daily. 10/25/18   Barton Dubois, MD  metoCLOPramide (REGLAN) 5 MG tablet Take 1 tablet (5 mg total) by mouth every 8 (eight) hours as needed for nausea. 12/24/18   [name], MD  nitroGLYCERIN (NITROSTAT) 0.4 MG SL tablet Place 0.4 mg under the tongue every 5 (five) minutes as needed for chest pain.     [provider]  ondansetron (ZOFRAN) 4 MG tablet Take 1 tablet (4 mg total)  by mouth every 8 (eight) hours as needed for nausea or vomiting. 12/14/19   [name], DO  potassium chloride SA (K-DUR,KLOR-CON) 20 MEQ tablet Take 1 tablet (20 mEq total) by mouth daily. 10/24/18   Barton Dubois, MD  traMADol Veatrice Bourbon) 50 MG tablet Take 1 or 2 po Q 6hrs for pain 04/11/20   Rolland Porter, MD    Allergies    Bee venom and Other  Review of Systems   Review of Systems  All other systems reviewed and are negative.   Physical Exam Updated Vital Signs BP (!) 145/83   Pulse 75   Temp 99 F (37.2 C) (Oral)   Resp 18   Ht 5' 4.5" (1.638 m)   Wt 49.9 kg   SpO2 99%   BMI 18.59 kg/m   Physical Exam Vitals and nursing note reviewed.  Constitutional:      Appearance: Normal appearance. She is normal weight.  HENT:     Head: Normocephalic and atraumatic.     Comments: There are no signs of trauma, abrasions, swelling, her head is nontender to palpation    Nose: Nose normal.  Eyes:     Extraocular Movements: Extraocular movements intact.     Conjunctiva/sclera: Conjunctivae normal.     Pupils: Pupils are equal, round, and reactive to light.     Comments: Pupils are small, when I asked patient to look at me she does not seem to know where I am, she states she is legally blind from glaucoma.  Cardiovascular:     Rate and Rhythm: Normal rate and regular rhythm.     Pulses: Normal pulses.  Pulmonary:     Effort: Pulmonary effort is normal. No respiratory distress.     Breath sounds: Normal breath sounds.    Musculoskeletal:     Cervical back: Normal range of motion.     Comments: Patient states she has pain and she points to her posterior left hip.  There is no shortening of her left leg, there is no internal or external rotation.  She is able to flex her knee however that causes a lot of pain in her posterior left hip.  Skin:    General: Skin is warm and dry.  Neurological:     General: No focal deficit present.     Mental Status: She is alert and oriented to person, place, and time.     Cranial Nerves: No cranial nerve deficit.  Psychiatric:        Mood and Affect: Mood normal.        Behavior: Behavior normal.        Thought Content: Thought content normal.     ED Results / Procedures / Treatments   Labs (all labs ordered are listed, but only abnormal results are displayed) Labs Reviewed - No data to display  EKG None  Radiology CT Hip Left Wo Contrast  Result Date: 04/11/2020 CLINICAL DATA:  Left hip pain after fall. Unable to bear weight. EXAM: CT OF THE LEFT HIP WITHOUT CONTRAST TECHNIQUE: Multidetector CT imaging of the left hip was performed according to the standard protocol. Multiplanar CT image reconstructions were also generated. COMPARISON:  Radiograph earlier this day. FINDINGS: Bones/Joint/Cartilage There multiple scattered cortical lucencies throughout the visualized pelvic bones that appear to represent nutrient channels. No convincing finding of acute hip fracture. Moderate generalized spurring of the acetabulum with mild joint space narrowing. No significant hip joint effusion. Ligaments Suboptimally assessed by CT. Muscles and Tendons Confluent intramuscular hematoma.  Muscle bulk is maintained. Soft tissues Soft tissue edema laterally. No confluent subcutaneous soft tissue hematoma. Vascular calcification of the included iliac and common femoral vessels. IMPRESSION: 1. No convincing finding of acute hip fracture. There are scattered cortical lucencies throughout  visualized osseous structures that likely represent nutrient channels. If there is persistent clinical concern for fracture, recommend further evaluation with MRI. 2. Moderate left hip osteoarthritis. Electronically Signed   By: Keith Rake M.D.   On: 04/11/2020 00:11   DG Hip Unilat W or Wo Pelvis 2-3 Views Left  Result Date: 04/10/2020 CLINICAL DATA:  Pain, left hip pain and inability to ambulate since fall today EXAM: DG HIP (WITH OR WITHOUT PELVIS) 2-3V LEFT COMPARISON:  Radiograph 05/23/2012 FINDINGS: Bones of the pelvis are intact and congruent. No abnormal diastatic widening of the SI joints or symphysis pubis. The proximal femora are intact and aligned. No visible fracture of the left proximal femur, acetabulum, or obturator ring. Degenerative changes in the spine, SI joints and both hips are mild-to-moderate and minimally progressed from comparison exam in 2013. IMPRESSION: No acute osseous abnormality. Mild to moderate degenerative changes in the spine, SI joints and both hips. Electronically Signed   By: Lovena Le M.D.   On: 04/10/2020 23:17    Procedures Procedures (including critical care time)  Medications Ordered in ED Medications  HYDROcodone-acetaminophen (NORCO/VICODIN) 5-325 MG per tablet 1 tablet (1 tablet Oral Given 04/10/20 2336)    ED Course  I have reviewed the triage vital signs and the nursing notes.  Pertinent labs & imaging results that were available during my care of the patient were reviewed by me and considered in my medical decision making (see chart for details).    MDM Rules/Calculators/A&P                      We discussed her x-ray results which did not show any acute injury, however patient is having a lot of pain and she is unable to bear weight.  MRI is not available at night, CT of the hip was done to look for occult pelvic fracture.  Patient was given hydrocodone orally for pain.  Patient CT scan does not show any evidence of a occult  fracture.  She was discharged home to follow-up with her primary care doctor.  If she continues to have pain at that point MRI may be anticipated.  Patient already has a cane and a walker at home.   Final Clinical Impression(s) / ED Diagnoses Final diagnoses:  Fall, initial encounter  Left hip pain    Rx / DC Orders ED Discharge Orders         Ordered    traMADol (ULTRAM) 50 MG tablet     04/11/20 0032        OTC  Acetaminophen  Plan discharge  Rolland Porter, MD, Barbette Or, MD 04/11/20 Sande Rives    Rolland Porter, MD 04/11/20 [phone]

## 2020-04-10 NOTE — ED Triage Notes (Signed)
Pt presents to ED following fall. Pt c/o left hip pain. Pt denies LOC or hitting head. Pt on plavix.

## 2020-04-11 MED ORDER — TRAMADOL HCL 50 MG PO TABS: ORAL_TABLET | ORAL | 0 refills | Status: DC

## 2020-04-11 NOTE — Discharge Instructions (Addendum)
Use ice packs for comfort.  Take the tramadol with acetaminophen 650 mg every 6 hours for pain.  Use your walker until you are able to walk better with your cane.  If you continue to have pain please let your doctor know or see Dr. Aline Brochure, the orthopedist in [place], at that point they may consider doing an MRI of your hip area.

## 2020-04-19 ENCOUNTER — Ambulatory Visit: Payer: Self-pay | Admitting: Licensed Clinical Social Worker

## 2020-04-19 NOTE — Chronic Care Management (AMB) (Signed)
    Clinical Social Work  Care Management Outreach   04/19/2020 Name: [name] MRN: [id number] DOB: 20-Oct-1956 [name] is a 64 y.o. year old female who is a primary care patient of [name], DO .   LCSW reached out to [employer] today by phone for ongoing assessment of needs and to follow up on conversation about the PACE program.  The outreach was unsuccessful. Unable to leave a HIPPA compliant phone message due to no voice message set up.  LCSW also received message from PACE intake worker that she has not been able to reach patient.  Review of patient status, including review of consultants reports, relevant laboratory and other test results, and collaboration with appropriate care team members and the patient's provider was performed as part of comprehensive patient evaluation and provision of care management services.    Follow Up Plan: LCSW will call again in 3 to 5 days  [name], [place] / [place]   [phone] 9:51 AM

## 2020-04-26 ENCOUNTER — Other Ambulatory Visit: Payer: Self-pay

## 2020-04-26 ENCOUNTER — Ambulatory Visit: Payer: Medicare Other | Admitting: Licensed Clinical Social Worker

## 2020-04-26 DIAGNOSIS — Z7189 Other specified counseling: Secondary | ICD-10-CM

## 2020-04-26 NOTE — Chronic Care Management (AMB) (Signed)
Care Management   Clinical Social Work Follow Up   04/26/2020 Name: [name] MRN: [id number] DOB: 12-21-1955 Referred by: [name], DO  Reason for referral : Care Coordination (F/U call) [name] is a 64 y.o. year old female who is a primary care patient of [name], DO.   Reason for follow-up: Phone encounter with patient today for ongoing assessment and brief interventions to assist with care coordination needs.   Assessment: Patient reports she is doing well. No longer needs to part of [facility] foodbox program requested to stop delivery.  Patient's church is delivering food to her weekly.  Patient is not interested in the PACE program at this time, will continue with current medical providers. Would like LCSW to talk to son before moving forward with advance directives. Intervention: Discussed advance directives,  Motivational Interviewing also provided Plan:  1. Patient will have son call LCSW to review advance directives, 2. LCSW will mail Glucerna coupons  3. F/U with patient in 4 to 5 weeks  Review of patient status, including review of consultants reports, relevant laboratory and other test results, and collaboration with appropriate care team members and the patient's provider was performed as part of comprehensive patient evaluation and provision of care management services.    Advance Directive Status: N See Care Plan and [employer] application for related entries. SDOH (Social Determinants of Health) assessments performed: No needs identified  Goals Addressed            This Visit's Progress   . Patient needs Advance Directives   Not on track    Social Circle (see longtitudinal plan of care for additional care plan information)  Current Barriers & Progress:  . Patient with HTN and chronic Hep C. does not have an Advance Directive . Acknowledges deficits, education and support in order to complete this document . Limited education about the importance of naming a  healthcare power of attorney . Received advance directive package unable to read it due to poor eye sight. Will get aide and family to assist her . Has  given information to her sister to help her, will discuss at a later date . Patient would like LCSW to explain this her son Clinical Social Work Goal(s):  [name] Over the next 30 days, the patient will review mailed EMMI education on Advance Directive as evidenced by patient self report of review . Over the next 45 days, with assistance of LCSW, the patient will complete mailed Advance Directive packet, have notarized and provide a copy to provider office Interventions provided by LCSW: . Assessed barriers with completing advance directives . provided education regarding Advanced Directives. Patient Self Care Activities:  . Is unable to complete documentation independently . Able to identify next of kin or [place] of Attorney/Health Care Agent Please see past updates related to this goal by clicking on the "Past Updates" button in the selected goal        Outpatient Encounter Medications as of 04/26/2020  Medication Sig  . albuterol (PROVENTIL HFA;VENTOLIN HFA) 108 (90 BASE) MCG/ACT inhaler Inhale 2 puffs into the lungs every 6 (six) hours as needed for wheezing. For wheezing  . amLODipine (NORVASC) 5 MG tablet Take 1 tablet (5 mg total) by mouth daily.  [name] anastrozole (ARIMIDEX) 1 MG tablet Take 1 tablet (1 mg total) by mouth daily.  [name] aspirin 81 MG EC tablet Take 81 mg by mouth daily.    [name] atorvastatin (LIPITOR) 20 MG tablet Take  1 tablet (20 mg total) by mouth daily.  . carvedilol (COREG) 6.25 MG tablet Take 1 tablet (6.25 mg total) by mouth 2 (two) times daily with a meal.  . clopidogrel (PLAVIX) 75 MG tablet Take 75 mg by mouth daily.   [name] lisinopril (PRINIVIL,ZESTRIL) 40 MG tablet Take 1 tablet (40 mg total) by mouth daily.  . metoCLOPramide (REGLAN) 5 MG tablet Take 1 tablet (5 mg total) by mouth every 8 (eight) hours as needed for  nausea.  . nitroGLYCERIN (NITROSTAT) 0.4 MG SL tablet Place 0.4 mg under the tongue every 5 (five) minutes as needed for chest pain.   [name] ondansetron (ZOFRAN) 4 MG tablet Take 1 tablet (4 mg total) by mouth every 8 (eight) hours as needed for nausea or vomiting.  . potassium chloride SA (K-DUR,KLOR-CON) 20 MEQ tablet Take 1 tablet (20 mEq total) by mouth daily.  . traMADol (ULTRAM) 50 MG tablet Take 1 or 2 po Q 6hrs for pain   Facility-Administered Encounter Medications as of 04/26/2020  Medication  . mitoMYcin (MUTAMYCIN) Injection Use in OR only (0.4 mg/ml)   [name], [place] / [place]   [phone] 2:42 PM

## 2020-06-01 ENCOUNTER — Other Ambulatory Visit: Payer: Self-pay

## 2020-06-01 ENCOUNTER — Ambulatory Visit: Payer: Self-pay | Admitting: Licensed Clinical Social Worker

## 2020-06-01 ENCOUNTER — Ambulatory Visit: Payer: Medicare Other | Admitting: Licensed Clinical Social Worker

## 2020-06-01 DIAGNOSIS — Z7189 Other specified counseling: Secondary | ICD-10-CM

## 2020-06-01 NOTE — Chronic Care Management (AMB) (Signed)
Care Management   Clinical Social Work Follow Up   06/01/2020 Name: [name] MRN: [id number] DOB: Aug 19, 1956 Referred by: Benay Pike, MD  Reason for referral : Care Coordination (monthly checkin)  [name] is a 64 y.o. year old female who is a primary care patient of Benay Pike, MD.  Reason for follow-up: assess for barriers and progress for ongoing care coordination of needs .   Assessment: Patient reports she is doing well.  See care plan below for update   Plan: LCSW will assist patient with resolving concerns with Bill. e-mail sent to [employer]   Advance Directive Status: N See Care Plan for related entries. ; SDOH (Social Determinants of Health) assessments performed: Yes ; No needs identified   Goals Addressed              This Visit's Progress   .  I Need Depends         CARE PLAN ENTRY (see longtitudinal plan of care for additional care plan information)  Current Barriers & progress:  . Patient with HTN and chronic Hep C. needs incontinent supplies  . Unable to get to bathroom and wetting self several times a day . Acknowledges deficits and needs support, education and care coordination in order to meet this unmet need  . Patient received supplies however the briefs are too small . patient has encountered problems.  Have received bills for her supplies which were to be at no cost to her.  Patient unable to pay the bill  Clinical Goal(s):  Over the next 30 days, patient will receive incontinent supplies that meet her needs Interventions: . Assessed patient needs, and how current supplies are working . Collaborate with [employer] regarding patient needs for bill and if patient will have to pay for supplies  . Waiting to receive response from [name] with [employer]  Patient Self Care Activities: . Patient is unable to independently navigate getting incontinent supplies without care coordination support  . Patient will call company about concerns of having to  pay the bill . Please see past updates related to this goal by clicking on the "Past Updates" button in the selected goal        .  I would like to get Covid shot (pt-stated)   On track     [name] (see longitudinal plan of care for additional care plan information)  Current Barriers & Progress:  . Acknowledges deficits and needs support, education and care coordination in order to schedule Covid vaccine . Limited social support . Literacy concerns . Lacks knowledge of community resource: as to where to go and how to sign up . Patient states scheduled to get vaccine today Clinical Goal(s):  Over the next 45 days, patient will receive both Covid vaccines and be able to feel safely without fear of getting Covid Interventions: . Assessed for barriers to appointment Patient Self Care Activities: . Patient is unable to independently navigate scheduling Covid vaccine without care coordination support  Please see past updates related to this goal by clicking on the "Past Updates" button in the selected goal      .  Patient needs Advance Directives   Not on track     [name] (see longtitudinal plan of care for additional care plan information)  Current Barriers & Progress:  . Patient with HTN and chronic Hep C. does not have an Advance Directive . Acknowledges deficits, education and support in order to complete  this document . Limited education about the importance of naming a healthcare power of attorney . Received advance directive package unable to read it due to poor eye sight. Will get aide and family to assist her . Has  given information to her sister and son would like discuss at a later date Clinical Social Work Goal(s):  [name] Over the next 30 days, the patient will review mailed EMMI education on Advance Directive as evidenced by patient self report of review . Over the next 45 days, with assistance of LCSW, the patient will complete mailed Advance Directive packet,  have notarized and provide a copy to provider office Interventions provided by LCSW: . Assessed barriers with completing advance directives . provided education regarding Advanced Directives. Patient Self Care Activities:  . Is unable to complete documentation independently . Able to identify next of kin or [place] . Patient will call when ready to move forward Please see past updates related to this goal by clicking on the "Past Updates" button in the selected goal        Outpatient Encounter Medications as of 06/01/2020  Medication Sig  . albuterol (PROVENTIL HFA;VENTOLIN HFA) 108 (90 BASE) MCG/ACT inhaler Inhale 2 puffs into the lungs every 6 (six) hours as needed for wheezing. For wheezing  . amLODipine (NORVASC) 5 MG tablet Take 1 tablet (5 mg total) by mouth daily.  [name] anastrozole (ARIMIDEX) 1 MG tablet Take 1 tablet (1 mg total) by mouth daily.  [name] aspirin 81 MG EC tablet Take 81 mg by mouth daily.    [name] atorvastatin (LIPITOR) 20 MG tablet Take 1 tablet (20 mg total) by mouth daily.  . carvedilol (COREG) 6.25 MG tablet Take 1 tablet (6.25 mg total) by mouth 2 (two) times daily with a meal.  . clopidogrel (PLAVIX) 75 MG tablet Take 75 mg by mouth daily.   [name] lisinopril (PRINIVIL,ZESTRIL) 40 MG tablet Take 1 tablet (40 mg total) by mouth daily.  . metoCLOPramide (REGLAN) 5 MG tablet Take 1 tablet (5 mg total) by mouth every 8 (eight) hours as needed for nausea.  . nitroGLYCERIN (NITROSTAT) 0.4 MG SL tablet Place 0.4 mg under the tongue every 5 (five) minutes as needed for chest pain.   [name] ondansetron (ZOFRAN) 4 MG tablet Take 1 tablet (4 mg total) by mouth every 8 (eight) hours as needed for nausea or vomiting.  . potassium chloride SA (K-DUR,KLOR-CON) 20 MEQ tablet Take 1 tablet (20 mEq total) by mouth daily.  . traMADol (ULTRAM) 50 MG tablet Take 1 or 2 po Q 6hrs for pain   Facility-Administered Encounter Medications as of 06/01/2020  Medication  .  mitoMYcin (MUTAMYCIN) Injection Use in OR only (0.4 mg/ml)   Review of patient status, including review of consultants reports, relevant laboratory and other test results, and collaboration with appropriate care team members and the patient's provider was performed as part of comprehensive patient evaluation and provision of care management services.    [name], [place] / [place]   [phone] 10:55 AM

## 2020-06-01 NOTE — Chronic Care Management (AMB) (Signed)
   Social Work  Care Management Collaboration 06/01/2020 Name: [name] MRN: [id number] DOB: 05/22/1956 [name] is a 64 y.o. year old female who sees Benay Pike, MD for primary care.  Intervention: Patient was not interviewed or contacted during this encounter.  LCSW collaborated with [employer] for ongoing patient's needs. See care plan below for details. Plan: No further follow up required by LCSW at this time   Review of patient status, including review of consultants reports, relevant laboratory and other test results, and collaboration with appropriate care team members and the patient's provider was performed as part of comprehensive patient evaluation and provision of chronic care management services.    Goals Addressed            This Visit's Progress   . I Need Depends   On track     CARE PLAN ENTRY (see longtitudinal plan of care for additional care plan information)  Current Barriers & progress:  . Patient with HTN and chronic Hep C. needs incontinent supplies  . Unable to get to bathroom and wetting self several times a day . Acknowledges deficits and needs support, education and care coordination in order to meet this unmet need  . Patient received supplies however the briefs are too small . patient has encountered problems.  Have received bills for her supplies which were to be at no cost to her.  Patient unable to pay the bill  . Received return call from [employer], they did not send patient a bill and will contact her to resolve the issue and concern Clinical Goal(s):  Over the next 30 days, patient will receive incontinent supplies that meet her needs Interventions: . Assessed patient needs, and how current supplies are working . Collaborated with [employer] regarding patient concern with bill for supplies, they will continue to work with patient to resolve the concerns Patient Self Care Activities: . Patient is unable to independently navigate getting  incontinent supplies without care coordination support  . Patient will call company about concerns of having to pay the bill . Please see past updates related to this goal by clicking on the "Past Updates" button in the selected goal        [name], [place] / [place]   [phone] 12:43 PM

## 2020-06-18 ENCOUNTER — Telehealth: Payer: Self-pay | Admitting: Family Medicine

## 2020-06-18 NOTE — Telephone Encounter (Signed)
Can we call this pt to schedule an appt with me? Received request from [employer] for change in incontinence supplies. There is no recent notes documenting her need for incontinence supplies, which is what is being requested by [employer].  [please let her know I'd like to meet her (she is new to me) and discuss her issues with incontinence.  She has a supply of briefs already, but if she would like to be seen sooner than when I have an opening she can schedule with another provider as long as they document need for supplies and need for switching from briefs to pullons.

## 2020-06-21 NOTE — Telephone Encounter (Signed)
First available appt for Dr. Jeannine Kitten was 9/1. Scheduled appt for pt to see Dr. Maudie Mercury on 8/18. Ottis Stain, CMA

## 2020-07-03 ENCOUNTER — Other Ambulatory Visit: Payer: Self-pay | Admitting: Family Medicine

## 2020-07-06 ENCOUNTER — Ambulatory Visit: Payer: Medicare Other | Admitting: Family Medicine

## 2020-07-13 ENCOUNTER — Ambulatory Visit: Payer: Medicare Other | Admitting: Family Medicine

## 2020-07-17 ENCOUNTER — Ambulatory Visit: Payer: Medicare Other

## 2020-07-17 NOTE — Progress Notes (Deleted)
    SUBJECTIVE:   CHIEF COMPLAINT / HPI:   Incontinence:   Pap:   PNA vax:   covid vax:  PERTINENT  PMH / PSH: ***  OBJECTIVE:   There were no vitals taken for this visit.  ***  ASSESSMENT/PLAN:   No problem-specific Assessment & Plan notes found for this encounter.     Benay Pike, MD [place]

## 2020-07-26 ENCOUNTER — Telehealth: Payer: Self-pay | Admitting: Family Medicine

## 2020-07-26 NOTE — Telephone Encounter (Signed)
Can we call this pt and tell her that I  Cannot send in her prescription for pullon adult diapers until I have a documented office visit with her that I can send to the supplier? She no-showed the last appointment and hasn't scheduled any new appointments.

## 2020-09-10 ENCOUNTER — Other Ambulatory Visit: Payer: Self-pay | Admitting: Hematology and Oncology

## 2020-11-26 ENCOUNTER — Telehealth: Payer: Self-pay

## 2020-11-26 NOTE — Telephone Encounter (Signed)
Transition Care Management Unsuccessful Follow-up Telephone Call  Date of discharge and from where:  11/25/2020 [facility]  Attempts:  1st Attempt  Reason for unsuccessful TCM follow-up call:  No answer/busy

## 2020-11-28 NOTE — Telephone Encounter (Signed)
Transition Care Management Follow-up Telephone Call  Date of discharge and from where: 11/24/2020 [facility] ED  How have you been since you were released from the hospital? Doing pretty good. Patient stated she did pick up gabapentin.   Any questions or concerns? No  Items Reviewed:  Did the pt receive and understand the discharge instructions provided? Yes   Medications obtained and verified? Yes   Other? No   Any new allergies since your discharge? No   Dietary orders reviewed? Yes  Do you have support at home? Yes    Functional Questionnaire: (I = Independent and D = Dependent) ADLs: I  Bathing/Dressing- I  Meal Prep- I  Eating- I  Maintaining continence- I  Transferring/Ambulation- I  Managing Meds- I  Follow up appointments reviewed:   PCP Hospital f/u appt confirmed? Yes  Scheduled to see Lenice Llamas on 11/30/2020 @ 9:10am.  [facility] f/u appt confirmed? No    Are transportation arrangements needed? No   If their condition worsens, is the pt aware to call PCP or go to the Emergency Dept.? Yes  Was the patient provided with contact information for the PCP's office or ED? Yes  Was to pt encouraged to call back with questions or concerns? Yes

## 2020-11-29 NOTE — Progress Notes (Deleted)
    SUBJECTIVE:   CHIEF COMPLAINT / HPI:   Chronic back and leg pain. Left sided.   HTN: amlodipine, lisinopril, carvedilol.   DCIS: anastrozole.   dexa scan.  Flu, pna, tdap, covid  PERTINENT  PMH / PSH: ***  OBJECTIVE:   There were no vitals taken for this visit.  ***  ASSESSMENT/PLAN:   No problem-specific Assessment & Plan notes found for this encounter.     Benay Pike, MD [place]

## 2020-11-30 ENCOUNTER — Ambulatory Visit: Payer: Medicare Other | Admitting: Family Medicine

## 2021-01-21 NOTE — Assessment & Plan Note (Signed)
08/13/2017: Mammogram and ultrasound:Large left breast mass involving all quadrants measuring 7.5 cm with enlarged axillary lymph node, biopsy revealed high-grade DCIS ER 40% PR 20%,, lymph node biopsy negative, Tis N0 stage 0  09/08/2017:Left mastectomy: IDC grade 2, 0.6 cm, extensive DCIS, high-grade with necrosis, margins negative, 1/8 lymph node with micrometastatic disease, T1BN1 mic stage I a, ER 100%, PR 100%, Ki-67 70%, HER-2 negative ratio 1.23. Did not receive radiation because of comorbidities  Current treatment: antiestrogen therapy with anastrozole5 yearsstarted 09/28/2017 Patient history of Stroke and coronary artery disease  Anastrozole toxicities:Denies any hot flashes or myalgias.   CT CAP 01/20/2020: No evidence of metastatic disease.  Breast cancer surveillance: 1.Breast exam3/8/22: Benign 2.Right breast mammogramscheduled for 01/30/2020  Return to clinic in 1 year for follow-up

## 2021-01-21 NOTE — Progress Notes (Signed)
Patient Care Team: Benay Pike, MD as PCP - General (Family Medicine) [name], MD as Consulting Physician (Hematology and Oncology) [name], [name], NP as Nurse Practitioner (Hematology and Oncology) [name], MD as Consulting Physician (Radiation Oncology) [name], MD as Consulting Physician (General Surgery)  DIAGNOSIS:    ICD-10-CM   1. Malignant neoplasm of overlapping sites of left breast in female, estrogen receptor positive ([place])  C50.812    Z17.0     SUMMARY OF ONCOLOGIC HISTORY: Oncology History  Ductal carcinoma in situ (DCIS) of left breast  08/13/2017 Initial Diagnosis   Large left breast mass involving all quadrants measuring 7.5 cm with enlarged axillary lymph node, biopsy revealed high-grade  DCIS ER 40% PR 20%,, lymph node biopsy negative, Tis N0 stage 0   09/08/2017 Surgery   Left mastectomy: IDC grade 2, 0.6 cm, extensive high-grade DCIS with necrosis, margins negative, 1/8 lymph nodes with micrometastases, T1bN1Mic (Stage 1A)   Malignant neoplasm of overlapping sites of left breast in female, estrogen receptor positive ([place])  08/19/2017 Initial Diagnosis   Malignant neoplasm of overlapping sites of left breast in female, estrogen receptor positive ([place])   09/08/2017 Surgery   Left mastectomy: IDC grade 2, 0.6 cm, extensive DCIS, high-grade with necrosis, margins negative, 1/8 lymph node with micrometastatic disease, T1BN1 mic stage I a, ER 100%, PR 100%, Ki-67 70%, HER-2 negative ratio 1.23.   09/2017 -  Anti-estrogen oral therapy   Anastrozole 61m daily     CHIEF COMPLIANT: Follow-up ofleft breast cancer onanastrozole   INTERVAL HISTORY: Morgan AISPUROis a 65y.o. with above-mentioned history of leftbreast cancer treated with leftmastectomyand is currently on anti-estrogen therapywith anastrozole.Mammogram on 01/27/20 showed no evidence of malignancy bilaterally. She presents to the clinic today for follow-up.  She does  me that she is tolerating anastrozole extremely well without any problems.  She has arthritis in her back and her hip.  She denies any lumps or nodules in the breast or chest wall.  Her performance is still very poor because of prior history of stroke and coronary artery disease.  ALLERGIES:  is allergic to bee venom and other.  MEDICATIONS:  Current Outpatient Medications  Medication Sig Dispense Refill  . albuterol (PROVENTIL HFA;VENTOLIN HFA) 108 (90 BASE) MCG/ACT inhaler Inhale 2 puffs into the lungs every 6 (six) hours as needed for wheezing. For wheezing    . amLODipine (NORVASC) 5 MG tablet Take 1 tablet (5 mg total) by mouth daily.    .Marland Kitchenanastrozole (ARIMIDEX) 1 MG tablet Take 1 tablet (1 mg total) by mouth daily. 90 tablet 3  . aspirin 81 MG EC tablet Take 81 mg by mouth daily.      .Marland Kitchenatorvastatin (LIPITOR) 20 MG tablet Take 1 tablet (20 mg total) by mouth daily.    . carvedilol (COREG) 6.25 MG tablet Take 1 tablet (6.25 mg total) by mouth 2 (two) times daily with a meal.    . clopidogrel (PLAVIX) 75 MG tablet Take 75 mg by mouth daily.     .Marland Kitchenlisinopril (PRINIVIL,ZESTRIL) 40 MG tablet Take 1 tablet (40 mg total) by mouth daily. 30 tablet 1  . metoCLOPramide (REGLAN) 5 MG tablet Take 1 tablet (5 mg total) by mouth every 8 (eight) hours as needed for nausea. 30 tablet 3  . nitroGLYCERIN (NITROSTAT) 0.4 MG SL tablet Place 0.4 mg under the tongue every 5 (five) minutes as needed for chest pain.     .Marland Kitchenondansetron (ZOFRAN) 4 MG  tablet Take 1 tablet (4 mg total) by mouth every 8 (eight) hours as needed for nausea or vomiting. 20 tablet 0  . potassium chloride SA (K-DUR,KLOR-CON) 20 MEQ tablet Take 1 tablet (20 mEq total) by mouth daily.    . traMADol (ULTRAM) 50 MG tablet Take 1 or 2 po Q 6hrs for pain 16 tablet 0   No current facility-administered medications for this visit.   Facility-Administered Medications Ordered in Other Visits  Medication Dose Route Frequency Provider Last Rate Last  Admin  . mitoMYcin (MUTAMYCIN) Injection Use in OR only (0.4 mg/ml)  0.5 mL Right Eye Once Marylynn Pearson, MD        PHYSICAL EXAMINATION: ECOG PERFORMANCE STATUS: 1 - Symptomatic but completely ambulatory  Vitals:   01/22/21 1027  BP: 140/69  Pulse: 74  Resp: 18  Temp: 97.9 F (36.6 C)  SpO2: 100%   Filed Weights   01/22/21 1027  Weight: 114 lb 6.4 oz (51.9 kg)       LABORATORY DATA:  I have reviewed the data as listed CMP Latest Ref Rng & Units 12/26/2019 10/22/2018 05/31/2018  Glucose 70 - 99 mg/dL 89 97 84  BUN 8 - 23 mg/dL _0 Creatinine 0.44 - 1.00 mg/dL 0.87 1.09(H) 1.00  Sodium 135 - 145 mmol/L 139 141 141  Potassium 3.5 - 5.1 mmol/L 4.0 3.0(L) 4.3  Chloride 98 - 111 mmol/L 105 107 100  CO2 22 - 32 mmol/L _1 Calcium 8.9 - 10.3 mg/dL 9.1 8.3(L) 10.1  Total Protein 6.5 - 8.1 g/dL 7.1 6.4(L) 7.2  Total Bilirubin 0.3 - 1.2 mg/dL 0.2(L) 0.3 0.3  Alkaline Phos 38 - 126 U/L 51 39 48  AST 15 - 41 U/L 16 14(L) 14  ALT 0 - 44 U/L _2 Lab Results  Component Value Date   WBC 8.2 12/26/2019   HGB 11.1 (L) 12/26/2019   HCT 32.7 (L) 12/26/2019   MCV 86.5 12/26/2019   PLT 276 12/26/2019   NEUTROABS 4.4 12/26/2019    ASSESSMENT & PLAN:  Malignant neoplasm of overlapping sites of left breast in female, estrogen receptor positive ([place]) 08/13/2017: Mammogram and ultrasound:Large left breast mass involving all quadrants measuring 7.5 cm with enlarged axillary lymph node, biopsy revealed high-grade DCIS ER 40% PR 20%,, lymph node biopsy negative, Tis N0 stage 0  09/08/2017:Left mastectomy: IDC grade 2, 0.6 cm, extensive DCIS, high-grade with necrosis, margins negative, 1/8 lymph node with micrometastatic disease, T1BN1 mic stage I a, ER 100%, PR 100%, Ki-67 70%, HER-2 negative ratio 1.23. Did not receive radiation because of comorbidities  Current treatment: antiestrogen therapy with anastrozole5 yearsstarted 09/28/2017 Patient history of Stroke and  coronary artery disease She has borderline performance status.  Anastrozole toxicities:Denies any hot flashes or myalgias.   CT CAP 01/20/2020: No evidence of metastatic disease.  Breast cancer surveillance: 1.Breast exam3/8/22: Benign 2.Right breast mammogram 01/30/2020 We will request another right breast mammogram.  Return to clinic in 1 year for follow-up    No orders of the defined types were placed in this encounter.  The patient has a good understanding of the overall plan. she agrees with it. she will call with any problems that may develop before the next visit here.  Total time spent: 20 mins including face to face time and time spent for planning, charting and coordination of care  Rulon Eisenmenger, MD, MPH 01/22/2021  I, Cloyde Reams Dorshimer, am acting as scribe for Dr.  [name].  I have reviewed the above documentation for accuracy and completeness, and I agree with the above.

## 2021-01-22 ENCOUNTER — Other Ambulatory Visit: Payer: Self-pay

## 2021-01-22 ENCOUNTER — Inpatient Hospital Stay: Payer: Medicare Other | Attending: Hematology and Oncology | Admitting: Hematology and Oncology

## 2021-01-22 DIAGNOSIS — I2510 Atherosclerotic heart disease of native coronary artery without angina pectoris: Secondary | ICD-10-CM | POA: Diagnosis not present

## 2021-01-22 DIAGNOSIS — Z7982 Long term (current) use of aspirin: Secondary | ICD-10-CM | POA: Diagnosis not present

## 2021-01-22 DIAGNOSIS — Z170 Estrogen receptor positive status [ER+]: Secondary | ICD-10-CM

## 2021-01-22 DIAGNOSIS — Z79899 Other long term (current) drug therapy: Secondary | ICD-10-CM | POA: Diagnosis not present

## 2021-01-22 DIAGNOSIS — C773 Secondary and unspecified malignant neoplasm of axilla and upper limb lymph nodes: Secondary | ICD-10-CM | POA: Insufficient documentation

## 2021-01-22 DIAGNOSIS — C50812 Malignant neoplasm of overlapping sites of left female breast: Secondary | ICD-10-CM | POA: Diagnosis present

## 2021-01-22 DIAGNOSIS — Z8673 Personal history of transient ischemic attack (TIA), and cerebral infarction without residual deficits: Secondary | ICD-10-CM | POA: Insufficient documentation

## 2021-01-22 MED ORDER — ANASTROZOLE 1 MG PO TABS: 1.0000 mg | ORAL_TABLET | Freq: Every day | ORAL | 3 refills | Status: DC

## 2021-08-22 ENCOUNTER — Telehealth: Payer: Self-pay

## 2021-08-22 NOTE — Telephone Encounter (Signed)
Transition Care Management Unsuccessful Follow-up Telephone Call  Date of discharge and from where:  08/21/2021-[facility]   Attempts:  1st Attempt  Reason for unsuccessful TCM follow-up call:  Missing or invalid number phone number is not in service.

## 2021-08-23 NOTE — Telephone Encounter (Signed)
Transition Care Management Unsuccessful Follow-up Telephone Call  Date of discharge and from where:  08/21/2021-[facility]  Attempts:  2nd Attempt  Reason for unsuccessful TCM follow-up call:  Missing or invalid number phone number is not in service.

## 2021-08-26 NOTE — Telephone Encounter (Signed)
Transition Care Management Unsuccessful Follow-up Telephone Call  Date of discharge and from where:  08/21/2021 from [facility]  Attempts:  3rd Attempt  Reason for unsuccessful TCM follow-up call:  Unable to reach patient

## 2021-10-31 ENCOUNTER — Emergency Department (HOSPITAL_COMMUNITY): Payer: Medicare Other

## 2021-10-31 ENCOUNTER — Encounter (HOSPITAL_COMMUNITY): Payer: Self-pay | Admitting: *Deleted

## 2021-10-31 ENCOUNTER — Emergency Department (HOSPITAL_COMMUNITY)
Admission: EM | Admit: 2021-10-31 | Discharge: 2021-10-31 | Disposition: A | Discharge status: Home / Self Care | Payer: Medicare Other | Attending: Emergency Medicine | Admitting: Emergency Medicine

## 2021-10-31 DIAGNOSIS — R030 Elevated blood-pressure reading, without diagnosis of hypertension: Secondary | ICD-10-CM | POA: Insufficient documentation

## 2021-10-31 DIAGNOSIS — Z79899 Other long term (current) drug therapy: Secondary | ICD-10-CM | POA: Insufficient documentation

## 2021-10-31 DIAGNOSIS — I2510 Atherosclerotic heart disease of native coronary artery without angina pectoris: Secondary | ICD-10-CM | POA: Insufficient documentation

## 2021-10-31 DIAGNOSIS — Z853 Personal history of malignant neoplasm of breast: Secondary | ICD-10-CM | POA: Diagnosis not present

## 2021-10-31 DIAGNOSIS — J45909 Unspecified asthma, uncomplicated: Secondary | ICD-10-CM | POA: Diagnosis not present

## 2021-10-31 DIAGNOSIS — R110 Nausea: Secondary | ICD-10-CM | POA: Diagnosis not present

## 2021-10-31 DIAGNOSIS — I10 Essential (primary) hypertension: Secondary | ICD-10-CM | POA: Diagnosis not present

## 2021-10-31 DIAGNOSIS — F17210 Nicotine dependence, cigarettes, uncomplicated: Secondary | ICD-10-CM | POA: Diagnosis not present

## 2021-10-31 DIAGNOSIS — Z7982 Long term (current) use of aspirin: Secondary | ICD-10-CM | POA: Diagnosis not present

## 2021-10-31 DIAGNOSIS — J449 Chronic obstructive pulmonary disease, unspecified: Secondary | ICD-10-CM | POA: Diagnosis not present

## 2021-10-31 DIAGNOSIS — Z20822 Contact with and (suspected) exposure to covid-19: Secondary | ICD-10-CM | POA: Diagnosis not present

## 2021-10-31 DIAGNOSIS — Z955 Presence of coronary angioplasty implant and graft: Secondary | ICD-10-CM | POA: Insufficient documentation

## 2021-10-31 DIAGNOSIS — R519 Headache, unspecified: Secondary | ICD-10-CM | POA: Diagnosis not present

## 2021-10-31 LAB — COMPREHENSIVE METABOLIC PANEL
ALT: 14 U/L (ref 0–44)
AST: 23 U/L (ref 15–41)
Albumin: 4.6 g/dL (ref 3.5–5.0)
Alkaline Phosphatase: 53 U/L (ref 38–126)
Anion gap: 10 (ref 5–15)
BUN: 12 mg/dL (ref 8–23)
CO2: 24 mmol/L (ref 22–32)
Calcium: 9.6 mg/dL (ref 8.9–10.3)
Chloride: 94 mmol/L — ABNORMAL LOW (ref 98–111)
Creatinine, Ser: 0.73 mg/dL (ref 0.44–1.00)
GFR, Estimated: >60 mL/min (ref >60)
Glucose, Bld: 109 mg/dL — ABNORMAL HIGH (ref 70–99)
Potassium: 3.1 mmol/L — ABNORMAL LOW (ref 3.5–5.1)
Sodium: 128 mmol/L — ABNORMAL LOW (ref 135–145)
Total Bilirubin: 0.7 mg/dL (ref 0.3–1.2)
Total Protein: 8.6 g/dL — ABNORMAL HIGH (ref 6.5–8.1)

## 2021-10-31 LAB — URINALYSIS, ROUTINE W REFLEX MICROSCOPIC
Bilirubin Urine: NEGATIVE
Glucose, UA: NEGATIVE mg/dL
Hgb urine dipstick: NEGATIVE
Ketones, ur: NEGATIVE mg/dL
Leukocytes,Ua: NEGATIVE
Nitrite: NEGATIVE
Protein, ur: NEGATIVE mg/dL
Specific Gravity, Urine: 1.015 (ref 1.005–1.030)
pH: 6 (ref 5.0–8.0)

## 2021-10-31 LAB — RESP PANEL BY RT-PCR (FLU A&B, COVID) ARPGX2
Influenza A by PCR: NEGATIVE
Influenza B by PCR: NEGATIVE
SARS Coronavirus 2 by RT PCR: NEGATIVE

## 2021-10-31 LAB — CBC WITH DIFFERENTIAL/PLATELET
Abs Immature Granulocytes: 0.03 10*3/uL (ref 0.00–0.07)
Basophils Absolute: 0.1 10*3/uL (ref 0.0–0.1)
Basophils Relative: 1 %
Eosinophils Absolute: 0.1 10*3/uL (ref 0.0–0.5)
Eosinophils Relative: 1 %
HCT: 39.4 % (ref 36.0–46.0)
Hemoglobin: 14 g/dL (ref 12.0–15.0)
Immature Granulocytes: 0 %
Lymphocytes Relative: 29 %
Lymphs Abs: 2.9 10*3/uL (ref 0.7–4.0)
MCH: 30.3 pg (ref 26.0–34.0)
MCHC: 35.5 g/dL (ref 30.0–36.0)
MCV: 85.3 fL (ref 80.0–100.0)
Monocytes Absolute: 0.8 10*3/uL (ref 0.1–1.0)
Monocytes Relative: 8 %
Neutro Abs: 6.2 10*3/uL (ref 1.7–7.7)
Neutrophils Relative %: 61 %
Platelets: 373 10*3/uL (ref 150–400)
RBC: 4.62 MIL/uL (ref 3.87–5.11)
RDW: 13.1 % (ref 11.5–15.5)
WBC: 9.9 10*3/uL (ref 4.0–10.5)
nRBC: 0 % (ref 0.0–0.2)

## 2021-10-31 LAB — LIPASE, BLOOD: Lipase: 57 U/L — ABNORMAL HIGH (ref 11–51)

## 2021-10-31 LAB — RAPID URINE DRUG SCREEN, HOSP PERFORMED
Amphetamines: NOT DETECTED
Barbiturates: NOT DETECTED
Benzodiazepines: NOT DETECTED
Cocaine: NOT DETECTED
Opiates: NOT DETECTED
Tetrahydrocannabinol: POSITIVE — AB

## 2021-10-31 LAB — ETHANOL: Alcohol, Ethyl (B): <10 mg/dL (ref <10)

## 2021-10-31 MED ORDER — LISINOPRIL 10 MG PO TABS
40.0000 mg | ORAL_TABLET | Freq: Once | ORAL | Status: AC
  Administered 2021-10-31: 40 mg via ORAL
  Filled 2021-10-31: qty 4

## 2021-10-31 MED ORDER — ONDANSETRON HCL 4 MG PO TABS: 4.0000 mg | ORAL_TABLET | Freq: Three times a day (TID) | ORAL | 0 refills | Status: DC | PRN

## 2021-10-31 MED ORDER — SODIUM CHLORIDE 0.9 % IV BOLUS
500.0000 mL | Freq: Once | INTRAVENOUS | Status: AC
  Administered 2021-10-31: 500 mL via INTRAVENOUS

## 2021-10-31 MED ORDER — ONDANSETRON HCL 4 MG/2ML IJ SOLN
4.0000 mg | Freq: Once | INTRAMUSCULAR | Status: AC
  Administered 2021-10-31: 4 mg via INTRAVENOUS
  Filled 2021-10-31: qty 2

## 2021-10-31 MED ORDER — AMLODIPINE BESYLATE 5 MG PO TABS
5.0000 mg | ORAL_TABLET | Freq: Once | ORAL | Status: AC
  Administered 2021-10-31: 5 mg via ORAL
  Filled 2021-10-31: qty 1

## 2021-10-31 NOTE — Discharge Instructions (Addendum)
Lab work shows cannabis in your system this can cause nausea I recommend discontinuing cannabis use, I given you Zofran please use needed for nausea.  Please can you with all home medications.  Your blood pressure is noted be elevated please continue with all home blood pressure medications, please follow your PCP for further evaluation.  Come back to the emergency department if you develop chest pain, shortness of breath, severe abdominal pain, uncontrolled nausea, vomiting, diarrhea.

## 2021-10-31 NOTE — ED Provider Notes (Signed)
[facility] Provider Note   CSN: [id number] Arrival date & time: 10/31/21  1623     History Chief Complaint  Patient presents with   Nausea   Hypertension    [name] is a 65 y.o. female.  HPI  Patient with medical history including left-sided breast cancer status post mastectomy, hypertension, CVA with right-sided weakness, presents with complaints of nausea.  Patient states that she has felt unwell for the last couple days, started on Sunday and has not felt well since, she states that she has felt waves of nausea without any vomiting, she states that she has no appetite due to the nausea, she denies  fevers, chills, nasal congestion, sore throat, cough, chest pain, shortness breath, general body aches, she has no associated stomach pain with this, denies any constipation, diarrhea, diarrhea denies melena or hematochezia.   She states that she gets episodes of severe nausea which is secondary due to her cancer history, states that she is run out of her antiemetic medication and needs more of it.  She denies alleviating or aggravating factors, she has no other complaints at this time.  Past Medical History:  Diagnosis Date   Adjustment disorder with depressed mood 01/11/2009   Arthritis    Asthma    Breast cancer ([place])    left breast   CAD 07/10/2008   Chronic back pain    COCAINE DEPENDENCY NOS 01/14/2007   stopped Cocaine 15 years    CONVULSIONS, SEIZURES, NOS 01/14/2007   last seizure May, 2016   COPD (chronic obstructive pulmonary disease) ([place])    Coronary artery disease 01/14/2012   CVA 01/14/2007   Difficulty seeing    patient has glaucoma   Family history of anesthesia complication 1840   Pts sister (41 years old) died after having anesthesia - induced a heart attcks   Glaucoma of both eyes    Headache(784.0)    HEPATITIS C 02/26/2009   Hepatitis C    Hyperlipemia    HYPERTENSION, BENIGN SYSTEMIC 01/14/2007   Memory loss    d/t CVA   MI  (myocardial infarction) ([place]) 12/20/2007   had stent placed   Pneumonia    hx of   PTSD (post-traumatic stress disorder)    PTSD (post-traumatic stress disorder)    Right sided weakness    d/t stroke   Sciatica    Stroke [facility])    right side weakness   TOBACCO DEPENDENCE 01/14/2007    Patient Active Problem List   Diagnosis Date Noted   Bradycardia    Marijuana abuse    Chronic nausea 05/31/2018   Malignant neoplasm of overlapping sites of left breast in female, estrogen receptor positive ([place]) 08/19/2017   Ductal carcinoma in situ (DCIS) of left breast 08/04/2017   Liver fibrosis 11/20/2015   Chronic hepatitis C without hepatic coma ([place]) 07/25/2014   Back pain 05/30/2014   Flank pain 12/02/2012   Hip pain, left 05/27/2012   Vision changes 04/08/2012   Healthcare maintenance 12/15/2011   Hyperlipidemia 12/13/2011   ADJUSTMENT DISORDER WITH DEPRESSED MOOD 01/11/2009   MILD COGNITIVE IMPAIRMENT SO STATED 07/10/2008   Coronary atherosclerosis 07/10/2008   TOBACCO DEPENDENCE 01/14/2007   HYPERTENSION, BENIGN SYSTEMIC 01/14/2007   CVA 01/14/2007   CONVULSIONS, SEIZURES, NOS 01/14/2007    Past Surgical History:  Procedure Laterality Date   BREAST LUMPECTOMY Left 09/18/2017   Procedure: EVACUATION HEMATOMA  LEFT BREAST;  Surgeon: [name], MD;  Location: [facility];  Service: General;  Laterality: Left;   CATARACT EXTRACTION W/PHACO Right 07/11/2015   Procedure: CATARACT EXTRACTION PHACO AND INTRAOCULAR LENS PLACEMENT ([place]) RIGHT,INTERNAL PLEB REVISION;  Surgeon: Marylynn Pearson, MD;  Location: [place];  Service: Ophthalmology;  Laterality: Right;   CESAREAN SECTION     x 2   COLONOSCOPY W/ POLYPECTOMY     CORONARY ANGIOPLASTY WITH STENT PLACEMENT  2009   1 stent   EYE SURGERY     MASTECTOMY W/ SENTINEL NODE BIOPSY Left 09/08/2017   Procedure: LEFT MASTECTOMY WITH SENTINEL LYMPH NODE BIOPSY;  Surgeon: [name], MD;  Location: [place];  Service: General;  Laterality: Left;    MITOMYCIN C APPLICATION Right 29/51/8841   Procedure: MITOMYCIN C APPLICATION RIGHT EYE;  Surgeon: Marylynn Pearson, MD;  Location: [place];  Service: Ophthalmology;  Laterality: Right;   TONSILLECTOMY     TRABECULECTOMY Left 06/29/2013   Procedure: TRABECULECTOMY WITH MYTOMICIN C LEFT EYE;  Surgeon: Marylynn Pearson, MD;  Location: [place];  Service: Ophthalmology;  Laterality: Left;   TRABECULECTOMY Right 09/07/2013   Procedure: TRABECULECTOMY RIGHT EYE;  Surgeon: Marylynn Pearson, MD;  Location: [place];  Service: Ophthalmology;  Laterality: Right;     OB History   No obstetric history on file.     Family History  Problem Relation Age of Onset   Stroke Mother    Colon cancer Paternal Uncle    Heart disease Sister    Colon cancer Paternal Aunt     Social History   Tobacco Use   Smoking status: Some Days    Packs/day: 0.25    Years: 47.00    Pack years: 11.75    Types: Cigarettes   Smokeless tobacco: Never  Vaping Use   Vaping Use: Never used  Substance Use Topics   Alcohol use: No    Comment: quit 4 years ago   Drug use: No    Comment: last use of cocaine 20 years ago    Home Medications Prior to Admission medications   Medication Sig Start Date End Date Taking? Authorizing Provider  ondansetron (ZOFRAN) 4 MG tablet Take 1 tablet (4 mg total) by mouth every 8 (eight) hours as needed for nausea or vomiting. 10/31/21  Yes Marcello Fennel, PA-C  albuterol (PROVENTIL HFA;VENTOLIN HFA) 108 (90 BASE) MCG/ACT inhaler Inhale 2 puffs into the lungs every 6 (six) hours as needed for wheezing. For wheezing    [provider]  amLODipine (NORVASC) 5 MG tablet Take 1 tablet (5 mg total) by mouth daily. 10/24/18   Barton Dubois, MD  anastrozole (ARIMIDEX) 1 MG tablet Take 1 tablet (1 mg total) by mouth daily. 01/22/21   [name], MD  aspirin 81 MG EC tablet Take 81 mg by mouth daily.      [provider]  atorvastatin (LIPITOR) 20 MG tablet Take 1 tablet (20 mg total) by  mouth daily. 12/26/19   [name], MD  carvedilol (COREG) 6.25 MG tablet Take 1 tablet (6.25 mg total) by mouth 2 (two) times daily with a meal. 12/26/19   [name], MD  clopidogrel (PLAVIX) 75 MG tablet Take 75 mg by mouth daily.     [provider]  lisinopril (PRINIVIL,ZESTRIL) 40 MG tablet Take 1 tablet (40 mg total) by mouth daily. 10/25/18   Barton Dubois, MD  metoCLOPramide (REGLAN) 5 MG tablet Take 1 tablet (5 mg total) by mouth every 8 (eight) hours as needed for nausea. 12/24/18   [name], MD  nitroGLYCERIN (NITROSTAT) 0.4 MG SL  tablet Place 0.4 mg under the tongue every 5 (five) minutes as needed for chest pain.     [provider]  potassium chloride SA (K-DUR,KLOR-CON) 20 MEQ tablet Take 1 tablet (20 mEq total) by mouth daily. 10/24/18   Barton Dubois, MD  traMADol Veatrice Bourbon) 50 MG tablet Take 1 or 2 po Q 6hrs for pain 04/11/20   Rolland Porter, MD    Allergies    Bee venom and Other  Review of Systems   Review of Systems  Constitutional:  Negative for chills and fever.  HENT:  Negative for congestion.   Respiratory:  Negative for shortness of breath.   Cardiovascular:  Negative for chest pain.  Gastrointestinal:  Positive for nausea. Negative for abdominal pain and vomiting.  Genitourinary:  Negative for enuresis.  Musculoskeletal:  Negative for back pain.  Skin:  Negative for rash.  Neurological:  Negative for dizziness.  Hematological:  Does not bruise/bleed easily.   Physical Exam Updated Vital Signs BP (!) 195/99    Pulse 64    Temp 97.9 F (36.6 C) (Oral)    Resp 12    SpO2 100%   Physical Exam Vitals and nursing note reviewed.  Constitutional:      General: She is not in acute distress.    Appearance: She is not ill-appearing.  HENT:     Head: Normocephalic and atraumatic.     Nose: No congestion.  Eyes:     Extraocular Movements: Extraocular movements intact.     Conjunctiva/sclera: Conjunctivae normal.     Pupils: Pupils are equal,  round, and reactive to light.  Cardiovascular:     Rate and Rhythm: Normal rate and regular rhythm.     Pulses: Normal pulses.     Heart sounds: No murmur heard.   No friction rub. No gallop.  Pulmonary:     Effort: No respiratory distress.     Breath sounds: No wheezing, rhonchi or rales.  Abdominal:     Palpations: Abdomen is soft.     Tenderness: There is no abdominal tenderness. There is no right CVA tenderness or left CVA tenderness.  Musculoskeletal:     Cervical back: No tenderness.     Comments: Patient is noted 3 out of 5 strength on the right side versus 4-5 strength on the left side,  Skin:    General: Skin is warm and dry.  Neurological:     Mental Status: She is alert.     Comments:  Slight Right-sided facial droop, right-sided weakness, 3-5 strength on the right side versus 4-5 strength on the left, no slurring of her words, no diffuse word finding, able to follow two-step commands, patient is this is her baseline, no new focal deficits present.  Psychiatric:        Mood and Affect: Mood normal.    ED Results / Procedures / Treatments   Labs (all labs ordered are listed, but only abnormal results are displayed) Labs Reviewed  COMPREHENSIVE METABOLIC PANEL - Abnormal; Notable for the following components:      Result Value   Sodium 128 (*)    Potassium 3.1 (*)    Chloride 94 (*)    Glucose, Bld 109 (*)    Total Protein 8.6 (*)    All other components within normal limits  LIPASE, BLOOD - Abnormal; Notable for the following components:   Lipase 57 (*)    All other components within normal limits  RAPID URINE DRUG SCREEN, HOSP PERFORMED - Abnormal; Notable  for the following components:   Tetrahydrocannabinol POSITIVE (*)    All other components within normal limits  RESP PANEL BY RT-PCR (FLU A&B, COVID) ARPGX2  CBC WITH DIFFERENTIAL/PLATELET  ETHANOL  URINALYSIS, ROUTINE W REFLEX MICROSCOPIC    EKG None  Radiology CT Head Wo Contrast  Result Date:  10/31/2021 CLINICAL DATA:  Chronic headache, nausea and vomiting, history of breast cancer EXAM: CT HEAD WITHOUT CONTRAST TECHNIQUE: Contiguous axial images were obtained from the base of the skull through the vertex without intravenous contrast. COMPARISON:  10/22/2018 FINDINGS: Brain: Stable hypodensities are seen throughout the periventricular white matter, bilateral basal ganglia, and bilateral thalami, consistent with chronic small vessel ischemic change. Signs of acute infarct or hemorrhage. Lateral ventricles and remaining midline structures are unremarkable. No acute extra-axial fluid collections. No mass effect. Vascular: No hyperdense vessel or unexpected calcification. Skull: Normal. Negative for fracture or focal lesion. Sinuses/Orbits: No acute finding. Other: None. IMPRESSION: 1. Chronic small vessel ischemic changes as above. No acute intracranial process. Electronically Signed   By: Randa Ngo M.D.   On: 10/31/2021 18:32    Procedures Procedures   Medications Ordered in ED Medications  sodium chloride 0.9 % bolus 500 mL (500 mLs Intravenous New Bag/Given 10/31/21 1753)  ondansetron (ZOFRAN) injection 4 mg (4 mg Intravenous Given 10/31/21 1754)  amLODipine (NORVASC) tablet 5 mg (5 mg Oral Given 10/31/21 1829)  lisinopril (ZESTRIL) tablet 40 mg (40 mg Oral Given 10/31/21 1829)    ED Course  I have reviewed the triage vital signs and the nursing notes.  Pertinent labs & imaging results that were available during my care of the patient were reviewed by me and considered in my medical decision making (see chart for details).    MDM Rules/Calculators/A&P                         Initial impression-presents with nausea, she is alert, no acute distress, vital signs noted for hypertension, patient states that she is not taking her blood pressure medication day, will provide her BP meds obtain basic lab work-up and reassess.  Also add on CT head rule due to increase  nausea.  Work-up-CBC unremarkable, CMP shows sodium 128, potassium 3.1, chloride 94, glucose 104, total protein 8.6, lipase 57, respiratory panel unremarkable.  Ethanol less than 10, rapid urine junction positive for cannabinoids.  CT head unremarkable, EKG sinus without signs of ischemia.  UA unremarkable.  Reassessment-no the patient has an elevated BP patient is she is not taking any of her blood pressure medications, will provide her with her home doses.  Patient is reassessed tolerating p.o., has no complaints, will continue to monitor.  Rule out-low suspicion for intracranial head bleed and/or CVA as there is no acute findings seen on CT scan, no new focal deficits present on exam, patient is now endorsing worsening right-sided weakness, paresthesias on the right side denies change in vision and or headaches.  I have low suspicion for hypertensive emergency as there is no signs of organ damage present my exam, it is slightly elevated suspect this is secondary due to missed doses of her blood pressure medication as well as due to nausea.  Low suspicion for lower lobe pneumonia as lung sounds are clear bilaterally, will defer imaging at this time.  I have low suspicion for liver or gallbladder abnormality as she has no right upper quadrant tenderness, liver enzymes, alk phos, T bili all within normal limits.  Low suspicion for  pancreatitis as she has no epigastric tenderness, lipase is slightly elevated 57 but is not 3 times above above normal doubt this is acute pancreatitis.  Low suspicion for ruptured stomach ulcer as she has no peritoneal sign present on exam.  Low suspicion for bowel obstruction as abdomen is nondistended normal bowel sounds, so passing gas and having normal bowel movements.  Low suspicion for complicated diverticulitis as she is nontoxic-appearing, vital signs reassuring no leukocytosis present.  Low suspicion for appendicitis as she has no right lower quadrant tenderness, vital  signs reassuring.  I have low suspicion for intra-abdominal infection as she has low risk factors, vital signs reassuring, no leukocytosis, will defer imaging at this time.   Plan-  Nausea since resolved-unclear etiology possible this is acute on chronic for her, she did test positive for cannabis which can cause cyclic vomiting.  will have her discontinue this, provide with antiemetics, follow-up with her PCP for further evaluation. High blood pressure-patient was made aware this, possible this is secondary due to nausea and missed dosages, the pain that she needs to take your blood pressure medication, follow with your PCP for further evaluation.  Vital signs have remained stable, no indication for hospital admission.  Patient discussed with attending and they agreed with assessment and plan.  Patient given at home care as well strict return precautions.  Patient verbalized that they understood agreed to said plan.     Final Clinical Impression(s) / ED Diagnoses Final diagnoses:  Nausea  Elevated blood pressure reading    Rx / DC Orders ED Discharge Orders          Ordered    ondansetron (ZOFRAN) 4 MG tablet  Every 8 hours PRN        10/31/21 2107             Aron Baba 10/31/21 2117    Sherwood Gambler, MD 11/01/21 2222

## 2021-10-31 NOTE — ED Triage Notes (Signed)
Nausea and vomiting x 4 days

## 2021-11-01 ENCOUNTER — Emergency Department (HOSPITAL_COMMUNITY)
Admission: EM | Admit: 2021-11-01 | Discharge: 2021-11-02 | Disposition: A | Discharge status: Home / Self Care | Payer: Medicare Other | Attending: Emergency Medicine | Admitting: Emergency Medicine

## 2021-11-01 ENCOUNTER — Encounter (HOSPITAL_COMMUNITY): Payer: Self-pay | Admitting: *Deleted

## 2021-11-01 ENCOUNTER — Telehealth: Payer: Self-pay

## 2021-11-01 DIAGNOSIS — Z7902 Long term (current) use of antithrombotics/antiplatelets: Secondary | ICD-10-CM | POA: Insufficient documentation

## 2021-11-01 DIAGNOSIS — Z853 Personal history of malignant neoplasm of breast: Secondary | ICD-10-CM | POA: Insufficient documentation

## 2021-11-01 DIAGNOSIS — I2510 Atherosclerotic heart disease of native coronary artery without angina pectoris: Secondary | ICD-10-CM | POA: Diagnosis not present

## 2021-11-01 DIAGNOSIS — Z7982 Long term (current) use of aspirin: Secondary | ICD-10-CM | POA: Diagnosis not present

## 2021-11-01 DIAGNOSIS — I10 Essential (primary) hypertension: Secondary | ICD-10-CM | POA: Insufficient documentation

## 2021-11-01 DIAGNOSIS — F17210 Nicotine dependence, cigarettes, uncomplicated: Secondary | ICD-10-CM | POA: Insufficient documentation

## 2021-11-01 DIAGNOSIS — R110 Nausea: Secondary | ICD-10-CM | POA: Insufficient documentation

## 2021-11-01 DIAGNOSIS — Z79899 Other long term (current) drug therapy: Secondary | ICD-10-CM | POA: Insufficient documentation

## 2021-11-01 DIAGNOSIS — J45909 Unspecified asthma, uncomplicated: Secondary | ICD-10-CM | POA: Diagnosis not present

## 2021-11-01 DIAGNOSIS — J449 Chronic obstructive pulmonary disease, unspecified: Secondary | ICD-10-CM | POA: Insufficient documentation

## 2021-11-01 NOTE — ED Triage Notes (Signed)
Unable to eat, seen yesterday for same

## 2021-11-01 NOTE — ED Notes (Signed)
Dc instructions reviewed with pt. Pt verbalized understanding and will set up appointment with GI next week. Pt denies any questions or concerns. Pt wheeled to lobby to wait for family to transport home.

## 2021-11-01 NOTE — ED Notes (Signed)
Pt Daughter in law called and stated that pt has not eaten since Sunday and is very weak. Also states BP is uncontrolled. Pt denies any pain just states she feel weak.

## 2021-11-01 NOTE — ED Provider Notes (Signed)
[facility] Provider Note  CSN: [id number] Arrival date & time: 11/01/21 1711    History No chief complaint on file.   [name] is a 65 y.o. female with history of multiple medical problem including chronic nausea was in the ED yesterday and had a negative workup, was able to drink fluids prior to discharge. She saw Dr. Terrence Dupont today and reported had elevated BP in his office, was given Rx for increased BP meds and she reports he told her to come back to the hospital. Her symptoms are not changed from yesterday. She is not sure why he told her to come back. She has not been vomiting. She was also told she needed to see a GI doctor which she has apparently never done. She would like to go home. She has nausea meds at home. BP is not significantly elevated in the ED.    Past Medical History:  Diagnosis Date   Adjustment disorder with depressed mood 01/11/2009   Arthritis    Asthma    Breast cancer ([place])    left breast   CAD 07/10/2008   Chronic back pain    COCAINE DEPENDENCY NOS 01/14/2007   stopped Cocaine 15 years    CONVULSIONS, SEIZURES, NOS 01/14/2007   last seizure May, 2016   COPD (chronic obstructive pulmonary disease) ([place])    Coronary artery disease 01/14/2012   CVA 01/14/2007   Difficulty seeing    patient has glaucoma   Family history of anesthesia complication 4098   Pts sister (67 years old) died after having anesthesia - induced a heart attcks   Glaucoma of both eyes    Headache(784.0)    HEPATITIS C 02/26/2009   Hepatitis C    Hyperlipemia    HYPERTENSION, BENIGN SYSTEMIC 01/14/2007   Memory loss    d/t CVA   MI (myocardial infarction) ([place]) 12/20/2007   had stent placed   Pneumonia    hx of   PTSD (post-traumatic stress disorder)    PTSD (post-traumatic stress disorder)    Right sided weakness    d/t stroke   Sciatica    Stroke [facility])    right side weakness   TOBACCO DEPENDENCE 01/14/2007    Past Surgical History:  Procedure  Laterality Date   BREAST LUMPECTOMY Left 09/18/2017   Procedure: EVACUATION HEMATOMA  LEFT BREAST;  Surgeon: [name], MD;  Location: [place];  Service: General;  Laterality: Left;   CATARACT EXTRACTION W/PHACO Right 07/11/2015   Procedure: CATARACT EXTRACTION PHACO AND INTRAOCULAR LENS PLACEMENT ([place]) RIGHT,INTERNAL PLEB REVISION;  Surgeon: Marylynn Pearson, MD;  Location: [place];  Service: Ophthalmology;  Laterality: Right;   CESAREAN SECTION     x 2   COLONOSCOPY W/ POLYPECTOMY     CORONARY ANGIOPLASTY WITH STENT PLACEMENT  2009   1 stent   EYE SURGERY     MASTECTOMY W/ SENTINEL NODE BIOPSY Left 09/08/2017   Procedure: LEFT MASTECTOMY WITH SENTINEL LYMPH NODE BIOPSY;  Surgeon: [name], MD;  Location: [place] [facility];  Service: General;  Laterality: Left;   MITOMYCIN C APPLICATION Right 11/91/4782   Procedure: MITOMYCIN C APPLICATION RIGHT EYE;  Surgeon: Marylynn Pearson, MD;  Location: [facility] [place];  Service: Ophthalmology;  Laterality: Right;   TONSILLECTOMY     TRABECULECTOMY Left 06/29/2013   Procedure: TRABECULECTOMY WITH MYTOMICIN C LEFT EYE;  Surgeon: Marylynn Pearson, MD;  Location: [place];  Service: Ophthalmology;  Laterality: Left;   TRABECULECTOMY Right 09/07/2013   Procedure: TRABECULECTOMY RIGHT  EYE;  Surgeon: Marylynn Pearson, MD;  Location: [place];  Service: Ophthalmology;  Laterality: Right;    Family History  Problem Relation Age of Onset   Stroke Mother    Colon cancer Paternal Uncle    Heart disease Sister    Colon cancer Paternal Aunt     Social History   Tobacco Use   Smoking status: Some Days    Packs/day: 0.25    Years: 47.00    Pack years: 11.75    Types: Cigarettes   Smokeless tobacco: Never  Vaping Use   Vaping Use: Never used  Substance Use Topics   Alcohol use: No    Comment: quit 4 years ago   Drug use: No    Comment: last use of cocaine 20 years ago     Home Medications Prior to Admission medications   Medication Sig Start Date End Date Taking? Authorizing  Provider  albuterol (PROVENTIL HFA;VENTOLIN HFA) 108 (90 BASE) MCG/ACT inhaler Inhale 2 puffs into the lungs every 6 (six) hours as needed for wheezing. For wheezing    [provider]  amLODipine (NORVASC) 5 MG tablet Take 1 tablet (5 mg total) by mouth daily. 10/24/18   Barton Dubois, MD  anastrozole (ARIMIDEX) 1 MG tablet Take 1 tablet (1 mg total) by mouth daily. 01/22/21   [name], MD  aspirin 81 MG EC tablet Take 81 mg by mouth daily.      [provider]  atorvastatin (LIPITOR) 20 MG tablet Take 1 tablet (20 mg total) by mouth daily. 12/26/19   [name], MD  carvedilol (COREG) 6.25 MG tablet Take 1 tablet (6.25 mg total) by mouth 2 (two) times daily with a meal. 12/26/19   [name], MD  clopidogrel (PLAVIX) 75 MG tablet Take 75 mg by mouth daily.     [provider]  lisinopril (PRINIVIL,ZESTRIL) 40 MG tablet Take 1 tablet (40 mg total) by mouth daily. 10/25/18   Barton Dubois, MD  metoCLOPramide (REGLAN) 5 MG tablet Take 1 tablet (5 mg total) by mouth every 8 (eight) hours as needed for nausea. 12/24/18   [name], MD  nitroGLYCERIN (NITROSTAT) 0.4 MG SL tablet Place 0.4 mg under the tongue every 5 (five) minutes as needed for chest pain.     [provider]  ondansetron (ZOFRAN) 4 MG tablet Take 1 tablet (4 mg total) by mouth every 8 (eight) hours as needed for nausea or vomiting. 10/31/21   Marcello Fennel, PA-C  potassium chloride SA (K-DUR,KLOR-CON) 20 MEQ tablet Take 1 tablet (20 mEq total) by mouth daily. 10/24/18   Barton Dubois, MD  traMADol Veatrice Bourbon) 50 MG tablet Take 1 or 2 po Q 6hrs for pain 04/11/20   Rolland Porter, MD     Allergies    Bee venom and Other   Review of Systems   Review of Systems A comprehensive review of systems was completed and negative except as noted in HPI.    Physical Exam BP (!) 157/92 (BP Location: Left Arm)    Pulse 63    Temp 98.4 F (36.9 C) (Oral)    Resp 18    SpO2 100%   Physical  Exam Vitals and nursing note reviewed.  Constitutional:      Appearance: Normal appearance.  HENT:     Head: Normocephalic and atraumatic.     Nose: Nose normal.     Mouth/Throat:     Mouth: Mucous membranes are moist.  Eyes:  Extraocular Movements: Extraocular movements intact.     Conjunctiva/sclera: Conjunctivae normal.  Cardiovascular:     Rate and Rhythm: Normal rate.  Pulmonary:     Effort: Pulmonary effort is normal.     Breath sounds: Normal breath sounds.  Abdominal:     General: Abdomen is flat.     Palpations: Abdomen is soft.     Tenderness: There is no abdominal tenderness.  Musculoskeletal:        General: No swelling. Normal range of motion.     Cervical back: Neck supple.  Skin:    General: Skin is warm and dry.  Neurological:     General: No focal deficit present.     Mental Status: She is alert.  Psychiatric:        Mood and Affect: Mood normal.     ED Results / Procedures / Treatments   Labs (all labs ordered are listed, but only abnormal results are displayed) Labs Reviewed - No data to display  EKG None  Radiology CT Head Wo Contrast  Result Date: 10/31/2021 CLINICAL DATA:  Chronic headache, nausea and vomiting, history of breast cancer EXAM: CT HEAD WITHOUT CONTRAST TECHNIQUE: Contiguous axial images were obtained from the base of the skull through the vertex without intravenous contrast. COMPARISON:  10/22/2018 FINDINGS: Brain: Stable hypodensities are seen throughout the periventricular white matter, bilateral basal ganglia, and bilateral thalami, consistent with chronic small vessel ischemic change. Signs of acute infarct or hemorrhage. Lateral ventricles and remaining midline structures are unremarkable. No acute extra-axial fluid collections. No mass effect. Vascular: No hyperdense vessel or unexpected calcification. Skull: Normal. Negative for fracture or focal lesion. Sinuses/Orbits: No acute finding. Other: None. IMPRESSION: 1. Chronic  small vessel ischemic changes as above. No acute intracranial process. Electronically Signed   By: Randa Ngo M.D.   On: 10/31/2021 18:32    Procedures Procedures  Medications Ordered in the ED Medications - No data to display   MDM Rules/Calculators/A&P MDM Patient here with continued chronic nausea, has not vomiting during this ED visit. She does not want any additional ED workup and wants to go home. She was encouraged to drink fluids even if she has trouble with solids to stay hydrated. She will be referred to GI for outpatient follow up.   ED Course  I have reviewed the triage vital signs and the nursing notes.  Pertinent labs & imaging results that were available during my care of the patient were reviewed by me and considered in my medical decision making (see chart for details).     Final Clinical Impression(s) / ED Diagnoses Final diagnoses:  Chronic nausea    Rx / DC Orders ED Discharge Orders     None        Truddie Hidden, MD 11/01/21 2141

## 2021-11-01 NOTE — Telephone Encounter (Signed)
Transition Care Management Unsuccessful Follow-up Telephone Call  Date of discharge and from where:  10/31/2021-[facility]   Attempts:  1st Attempt  Reason for unsuccessful TCM follow-up call:  Unable to leave message

## 2021-11-04 ENCOUNTER — Telehealth: Payer: Self-pay

## 2021-11-04 NOTE — Telephone Encounter (Signed)
Transition Care Management Unsuccessful Follow-up Telephone Call  Date of discharge and from where:  11/02/2021 from [facility]  Attempts:  1st Attempt  Reason for unsuccessful TCM follow-up call:  Unable to leave message

## 2021-11-04 NOTE — Telephone Encounter (Signed)
Transition Care Management Unsuccessful Follow-up Telephone Call  Date of discharge and from where:  10/31/2021 from [facility]  Attempts:  2nd Attempt  Reason for unsuccessful TCM follow-up call:  Unable to leave message

## 2021-11-05 NOTE — Telephone Encounter (Signed)
Transition Care Management Unsuccessful Follow-up Telephone Call  Date of discharge and from where:  11/02/2021 from [facility]  Attempts:  2nd Attempt  Reason for unsuccessful TCM follow-up call:  Unable to leave message

## 2021-11-05 NOTE — Telephone Encounter (Signed)
Transition Care Management Unsuccessful Follow-up Telephone Call  Date of discharge and from where:  10/31/2021 from [facility]  Attempts:  3rd Attempt  Reason for unsuccessful TCM follow-up call:  Unable to reach patient

## 2021-11-06 NOTE — Telephone Encounter (Signed)
Transition Care Management Unsuccessful Follow-up Telephone Call  Date of discharge and from where:  11/02/2021 from [facility]  Attempts:  3rd Attempt  Reason for unsuccessful TCM follow-up call:  Unable to reach patient

## 2021-11-25 ENCOUNTER — Encounter (INDEPENDENT_AMBULATORY_CARE_PROVIDER_SITE_OTHER): Payer: Self-pay | Admitting: Gastroenterology

## 2021-11-25 ENCOUNTER — Ambulatory Visit (INDEPENDENT_AMBULATORY_CARE_PROVIDER_SITE_OTHER): Payer: Medicare Other | Admitting: Gastroenterology

## 2022-01-07 ENCOUNTER — Encounter (INDEPENDENT_AMBULATORY_CARE_PROVIDER_SITE_OTHER): Payer: Self-pay | Admitting: Gastroenterology

## 2022-01-07 ENCOUNTER — Ambulatory Visit (INDEPENDENT_AMBULATORY_CARE_PROVIDER_SITE_OTHER): Payer: Medicare Other | Admitting: Gastroenterology

## 2022-01-21 NOTE — Progress Notes (Incomplete)
? ?Patient Care Team: ?[name], MD as PCP - General (Family Medicine) ?Morgan Lose, MD as Consulting Physician (Hematology and Oncology) ?Gardenia Phlegm, NP as Nurse Practitioner (Hematology and Oncology) ?[name], MD as Consulting Physician (Radiation Oncology) ?[name], MD as Consulting Physician (General Surgery) ? ?DIAGNOSIS: No diagnosis found. ? ?SUMMARY OF ONCOLOGIC HISTORY: ?Oncology History  ?Ductal carcinoma in situ (DCIS) of left breast  ?08/13/2017 Initial Diagnosis  ? Large left breast mass involving all quadrants measuring 7.5 cm with enlarged axillary lymph node, biopsy revealed high-grade  DCIS ER 40% PR 20%,, lymph node biopsy negative, Tis N0 stage 0 ?  ?09/08/2017 Surgery  ? Left mastectomy: IDC grade 2, 0.6 cm, extensive high-grade DCIS with necrosis, margins negative, 1/8 lymph nodes with micrometastases, T1bN1Mic (Stage 1A) ?  ?Malignant neoplasm of overlapping sites of left breast in female, estrogen receptor positive ([place])  ?08/19/2017 Initial Diagnosis  ? Malignant neoplasm of overlapping sites of left breast in female, estrogen receptor positive ([place]) ?  ?09/08/2017 Surgery  ? Left mastectomy: IDC grade 2, 0.6 cm, extensive DCIS, high-grade with necrosis, margins negative, 1/8 lymph node with micrometastatic disease, T1BN1 mic stage I a, ER 100%, PR 100%, Ki-67 70%, HER-2 negative ratio 1.23. ?  ?09/2017 -  Anti-estrogen oral therapy  ? Anastrozole 34m daily ?  ? ? ?CHIEF COMPLIANT: Follow-up of left breast cancer on anastrozole  ? ?INTERVAL HISTORY: LKATARYNA MCQUILKINis a 66y.o. with above-mentioned history of left breast cancer treated with left mastectomy and is currently on anti-estrogen therapy with anastrozole. She presents to the clinic today for follow-up.  ? ?ALLERGIES:  is allergic to bee venom and other. ? ?MEDICATIONS:  ?Current Outpatient Medications  ?Medication Sig Dispense Refill  ? albuterol (PROVENTIL HFA;VENTOLIN HFA) 108 (90 BASE) MCG/ACT  inhaler Inhale 2 puffs into the lungs every 6 (six) hours as needed for wheezing. For wheezing    ? amLODipine (NORVASC) 5 MG tablet Take 1 tablet (5 mg total) by mouth daily.    ? anastrozole (ARIMIDEX) 1 MG tablet Take 1 tablet (1 mg total) by mouth daily. 90 tablet 3  ? aspirin 81 MG EC tablet Take 81 mg by mouth daily.      ? atorvastatin (LIPITOR) 20 MG tablet Take 1 tablet (20 mg total) by mouth daily.    ? carvedilol (COREG) 6.25 MG tablet Take 1 tablet (6.25 mg total) by mouth 2 (two) times daily with a meal.    ? clopidogrel (PLAVIX) 75 MG tablet Take 75 mg by mouth daily.     ? lisinopril (PRINIVIL,ZESTRIL) 40 MG tablet Take 1 tablet (40 mg total) by mouth daily. 30 tablet 1  ? metoCLOPramide (REGLAN) 5 MG tablet Take 1 tablet (5 mg total) by mouth every 8 (eight) hours as needed for nausea. 30 tablet 3  ? nitroGLYCERIN (NITROSTAT) 0.4 MG SL tablet Place 0.4 mg under the tongue every 5 (five) minutes as needed for chest pain.     ? ondansetron (ZOFRAN) 4 MG tablet Take 1 tablet (4 mg total) by mouth every 8 (eight) hours as needed for nausea or vomiting. 12 tablet 0  ? potassium chloride SA (K-DUR,KLOR-CON) 20 MEQ tablet Take 1 tablet (20 mEq total) by mouth daily.    ? traMADol (ULTRAM) 50 MG tablet Take 1 or 2 po Q 6hrs for pain 16 tablet 0  ? ?No current facility-administered medications for this visit.  ? ?Facility-Administered Medications Ordered in Other Visits  ?Medication Dose Route Frequency  Provider Last Rate Last Admin  ? mitoMYcin (MUTAMYCIN) Injection Use in OR only (0.4 mg/ml)  0.5 mL Right Eye Once Morgan Pearson, MD      ? ? ?PHYSICAL EXAMINATION: ?ECOG PERFORMANCE STATUS: {CHL ONC ECOG YC:[id number]} ? ?There were no vitals filed for this visit. ?There were no vitals filed for this visit. ? ?BREAST:*** No palpable masses or nodules in either right or left breasts. No palpable axillary supraclavicular or infraclavicular adenopathy no breast tenderness or nipple discharge. (exam performed in  the presence of a chaperone) ? ?LABORATORY DATA:  ?I have reviewed the data as listed ?CMP Latest Ref Rng & Units 10/31/2021 12/26/2019 10/22/2018  ?Glucose 70 - 99 mg/dL 109(H) 89 97  ?BUN 8 - 23 mg/dL '12 9 15  ' ?Creatinine 0.44 - 1.00 mg/dL 0.73 0.87 1.09(H)  ?Sodium 135 - 145 mmol/Morgan 128(Morgan) 139 141  ?Potassium 3.5 - 5.1 mmol/Morgan 3.1(Morgan) 4.0 3.0(Morgan)  ?Chloride 98 - 111 mmol/Morgan 94(Morgan) 105 107  ?CO2 22 - 32 mmol/Morgan '24 28 28  ' ?Calcium 8.9 - 10.3 mg/dL 9.6 9.1 8.3(Morgan)  ?Total Protein 6.5 - 8.1 g/dL 8.6(H) 7.1 6.4(Morgan)  ?Total Bilirubin 0.3 - 1.2 mg/dL 0.7 0.2(Morgan) 0.3  ?Alkaline Phos 38 - 126 U/Morgan 53 51 39  ?AST 15 - 41 U/Morgan 23 16 14(Morgan)  ?ALT 0 - 44 U/Morgan '14 9 8  ' ? ? ?Lab Results  ?Component Value Date  ? WBC 9.9 10/31/2021  ? HGB 14.0 10/31/2021  ? HCT 39.4 10/31/2021  ? MCV 85.3 10/31/2021  ? PLT 373 10/31/2021  ? NEUTROABS 6.2 10/31/2021  ? ? ?ASSESSMENT & PLAN:  ?No problem-specific Assessment & Plan notes found for this encounter. ? ? ? ?No orders of the defined types were placed in this encounter. ? ?The patient has a good understanding of the overall plan. she agrees with it. she will call with any problems that may develop before the next visit here. ? ?Total time spent: *** mins including face to face time and time spent for planning, charting and coordination of care ? ?Morgan Eisenmenger, MD, MPH ?01/21/2022 ? ?I, [name], am acting as scribe for Dr. Nicholas [name]. ? ?{insert scribe attestation} ? ? ? ? ? ?

## 2022-01-22 ENCOUNTER — Inpatient Hospital Stay: Payer: Medicare Other | Attending: Hematology and Oncology | Admitting: Hematology and Oncology

## 2022-01-22 NOTE — Assessment & Plan Note (Deleted)
08/13/2017: Mammogram and ultrasound:Large left breast mass involving all quadrants measuring 7.5 cm with enlarged axillary lymph node, biopsy revealed high-grade ?DCIS ER 40% PR 20%,, lymph node biopsy negative, Tis N0 stage 0 ?? ?09/08/2017:?Left mastectomy: IDC grade 2, 0.6 cm, extensive DCIS, high-grade with necrosis, margins negative, 1/8 lymph node with micrometastatic disease, T1BN1 mic stage I a, ER 100%, PR 100%, Ki-67 70%, HER-2 negative ratio 1.23. ?Did not receive radiation because of comorbidities ?? ?Current treatment: antiestrogen therapy with anastrozole?5 years?started 09/28/2017 ?Patient history of ?Stroke and coronary artery disease? ?She has borderline performance status. ?? ?Anastrozole toxicities:?Denies any hot flashes or myalgias.? ?? ?CT CAP 01/20/2020: No evidence of metastatic disease. ?? ?Breast cancer surveillance: ?1.??Breast exam?01/22/22: Benign ?2.??Right breast mammogram??01/30/2020 ?We will request another right breast mammogram. ?? ?Return to clinic in 1 year for follow-up ?? ?? ?

## 2022-01-27 ENCOUNTER — Encounter: Payer: Self-pay | Admitting: Gastroenterology

## 2022-02-05 ENCOUNTER — Encounter: Payer: Self-pay | Admitting: Gastroenterology

## 2022-05-26 ENCOUNTER — Other Ambulatory Visit (HOSPITAL_COMMUNITY)
Admission: RE | Admit: 2022-05-26 | Discharge: 2022-05-26 | Disposition: A | Discharge status: Home / Self Care | Payer: Medicare Other | Source: Ambulatory Visit | Attending: Cardiology | Admitting: Cardiology

## 2022-05-26 DIAGNOSIS — I10 Essential (primary) hypertension: Secondary | ICD-10-CM | POA: Insufficient documentation

## 2022-05-26 LAB — HEPATIC FUNCTION PANEL
ALT: 10 U/L (ref 0–44)
AST: 18 U/L (ref 15–41)
Albumin: 4.1 g/dL (ref 3.5–5.0)
Alkaline Phosphatase: 51 U/L (ref 38–126)
Bilirubin, Direct: 0.1 mg/dL (ref 0.0–0.2)
Indirect Bilirubin: 0.4 mg/dL (ref 0.3–0.9)
Total Bilirubin: 0.5 mg/dL (ref 0.3–1.2)
Total Protein: 7.5 g/dL (ref 6.5–8.1)

## 2022-05-26 LAB — LIPID PANEL
Cholesterol: 213 mg/dL — ABNORMAL HIGH (ref 0–200)
HDL: 45 mg/dL (ref >40)
LDL Cholesterol: 149 mg/dL — ABNORMAL HIGH (ref 0–99)
Total CHOL/HDL Ratio: 4.7 RATIO
Triglycerides: 94 mg/dL (ref <150)
VLDL: 19 mg/dL (ref 0–40)

## 2022-05-26 LAB — BASIC METABOLIC PANEL
Anion gap: 5 (ref 5–15)
BUN: 7 mg/dL — ABNORMAL LOW (ref 8–23)
CO2: 27 mmol/L (ref 22–32)
Calcium: 9.5 mg/dL (ref 8.9–10.3)
Chloride: 108 mmol/L (ref 98–111)
Creatinine, Ser: 0.77 mg/dL (ref 0.44–1.00)
GFR, Estimated: >60 mL/min (ref >60)
Glucose, Bld: 106 mg/dL — ABNORMAL HIGH (ref 70–99)
Potassium: 3 mmol/L — ABNORMAL LOW (ref 3.5–5.1)
Sodium: 140 mmol/L (ref 135–145)

## 2022-08-12 ENCOUNTER — Inpatient Hospital Stay: Payer: Medicare Other | Attending: Hematology and Oncology | Admitting: Hematology and Oncology

## 2022-08-12 NOTE — Assessment & Plan Note (Deleted)
08/13/2017: Mammogram and ultrasound:Large left breast mass involving all quadrants measuring 7.5 cm with enlarged axillary lymph node, biopsy revealed high-grade DCIS ER 40% PR 20%,, lymph node biopsy negative, Tis N0 stage 0  09/08/2017:Left mastectomy: IDC grade 2, 0.6 cm, extensive DCIS, high-grade with necrosis, margins negative, 1/8 lymph node with micrometastatic disease, T1BN1 mic stage I a, ER 100%, PR 100%, Ki-67 70%, HER-2 negative ratio 1.23. Did not receive radiation because of comorbidities  Current treatment: antiestrogen therapy with anastrozole5 yearsstarted 09/28/2017 Patient history of Stroke and coronary artery disease She has borderline performance status.  Anastrozole toxicities:Denies any hot flashes or myalgias.  CT CAP 01/20/2020: No evidence of metastatic disease.  Breast cancer surveillance: 1.Breast exam 08/12/2022: Benign 2.Right breast mammogram3/15/2021: Benign breast density category C She has not had a mammogram in the last couple of years.  Return to clinic in 1 year for follow-up

## 2022-10-17 ENCOUNTER — Observation Stay (HOSPITAL_COMMUNITY)
Admission: EM | Admit: 2022-10-17 | Discharge: 2022-10-19 | Disposition: A | Discharge status: Home / Self Care | Payer: Medicare Other | Attending: Internal Medicine | Admitting: Internal Medicine

## 2022-10-17 ENCOUNTER — Other Ambulatory Visit: Payer: Self-pay

## 2022-10-17 ENCOUNTER — Emergency Department (HOSPITAL_COMMUNITY): Payer: Medicare Other

## 2022-10-17 ENCOUNTER — Encounter (HOSPITAL_COMMUNITY): Payer: Self-pay | Admitting: Emergency Medicine

## 2022-10-17 DIAGNOSIS — Z7902 Long term (current) use of antithrombotics/antiplatelets: Secondary | ICD-10-CM | POA: Diagnosis not present

## 2022-10-17 DIAGNOSIS — D649 Anemia, unspecified: Secondary | ICD-10-CM | POA: Diagnosis not present

## 2022-10-17 DIAGNOSIS — J45909 Unspecified asthma, uncomplicated: Secondary | ICD-10-CM | POA: Diagnosis not present

## 2022-10-17 DIAGNOSIS — I25118 Atherosclerotic heart disease of native coronary artery with other forms of angina pectoris: Secondary | ICD-10-CM | POA: Insufficient documentation

## 2022-10-17 DIAGNOSIS — R7989 Other specified abnormal findings of blood chemistry: Secondary | ICD-10-CM | POA: Diagnosis not present

## 2022-10-17 DIAGNOSIS — Z853 Personal history of malignant neoplasm of breast: Secondary | ICD-10-CM | POA: Diagnosis not present

## 2022-10-17 DIAGNOSIS — Z79899 Other long term (current) drug therapy: Secondary | ICD-10-CM | POA: Insufficient documentation

## 2022-10-17 DIAGNOSIS — F17210 Nicotine dependence, cigarettes, uncomplicated: Secondary | ICD-10-CM | POA: Insufficient documentation

## 2022-10-17 DIAGNOSIS — Z7982 Long term (current) use of aspirin: Secondary | ICD-10-CM | POA: Insufficient documentation

## 2022-10-17 DIAGNOSIS — I10 Essential (primary) hypertension: Secondary | ICD-10-CM | POA: Diagnosis not present

## 2022-10-17 DIAGNOSIS — Z8673 Personal history of transient ischemic attack (TIA), and cerebral infarction without residual deficits: Secondary | ICD-10-CM

## 2022-10-17 DIAGNOSIS — E782 Mixed hyperlipidemia: Secondary | ICD-10-CM | POA: Diagnosis not present

## 2022-10-17 DIAGNOSIS — L600 Ingrowing nail: Secondary | ICD-10-CM

## 2022-10-17 DIAGNOSIS — R0789 Other chest pain: Secondary | ICD-10-CM | POA: Diagnosis present

## 2022-10-17 DIAGNOSIS — R002 Palpitations: Secondary | ICD-10-CM | POA: Insufficient documentation

## 2022-10-17 DIAGNOSIS — J449 Chronic obstructive pulmonary disease, unspecified: Secondary | ICD-10-CM | POA: Insufficient documentation

## 2022-10-17 DIAGNOSIS — I2089 Other forms of angina pectoris: Secondary | ICD-10-CM

## 2022-10-17 DIAGNOSIS — Z681 Body mass index (BMI) 19 or less, adult: Secondary | ICD-10-CM | POA: Insufficient documentation

## 2022-10-17 DIAGNOSIS — R000 Tachycardia, unspecified: Secondary | ICD-10-CM | POA: Diagnosis not present

## 2022-10-17 DIAGNOSIS — Z955 Presence of coronary angioplasty implant and graft: Secondary | ICD-10-CM | POA: Diagnosis not present

## 2022-10-17 DIAGNOSIS — R636 Underweight: Secondary | ICD-10-CM | POA: Insufficient documentation

## 2022-10-17 DIAGNOSIS — I2510 Atherosclerotic heart disease of native coronary artery without angina pectoris: Secondary | ICD-10-CM | POA: Diagnosis present

## 2022-10-17 DIAGNOSIS — E876 Hypokalemia: Secondary | ICD-10-CM | POA: Diagnosis not present

## 2022-10-17 DIAGNOSIS — R627 Adult failure to thrive: Secondary | ICD-10-CM | POA: Insufficient documentation

## 2022-10-17 LAB — CBC
HCT: 35.4 % — ABNORMAL LOW (ref 36.0–46.0)
Hemoglobin: 11.8 g/dL — ABNORMAL LOW (ref 12.0–15.0)
MCH: 28.6 pg (ref 26.0–34.0)
MCHC: 33.3 g/dL (ref 30.0–36.0)
MCV: 85.9 fL (ref 80.0–100.0)
Platelets: 309 10*3/uL (ref 150–400)
RBC: 4.12 MIL/uL (ref 3.87–5.11)
RDW: 13.2 % (ref 11.5–15.5)
WBC: 8.6 10*3/uL (ref 4.0–10.5)
nRBC: 0 % (ref 0.0–0.2)

## 2022-10-17 LAB — CBG MONITORING, ED: Glucose-Capillary: 124 mg/dL — ABNORMAL HIGH (ref 70–99)

## 2022-10-17 MED ORDER — ASPIRIN 81 MG PO CHEW
324.0000 mg | CHEWABLE_TABLET | Freq: Once | ORAL | Status: AC
  Administered 2022-10-17: 324 mg via ORAL
  Filled 2022-10-17: qty 4

## 2022-10-17 NOTE — ED Provider Triage Note (Signed)
Emergency Medicine Provider Triage Evaluation Note  [name] , a 66 y.o. female  was evaluated in triage.  Pt complains of chest pain and palpitations that occurred just prior to arrival.  She is completely symptom-free at this time.  Paramedics stated that when they got to the house the patient was tachycardic to about 130 but by the time they transported she had normalized her heart rate and all of her chest pain gone away.  Review of Systems  Positive: Chest pain and palpitations Negative: Fevers, chills, shortness of breath, nausea, vomiting, diarrhea  Physical Exam  BP (!) 179/107 (BP Location: Left Arm)   Pulse 69   Temp (!) 97.5 F (36.4 C) (Oral)   Resp 18   Ht 1.638 m (5' 4.5")   Wt 43.5 kg   SpO2 99%   BMI 16.22 kg/m  Gen:   Awake, no distress resting in bed Resp:  Normal effort clear lung sounds Cardiac: Regular rate and rhythm rate of about 70 bpm, no murmurs, normal pulses, no edema, no JVD MSK:   Moves extremities without difficulty no edema Other:  Speaks in full sentences  Medical Decision Making  Medically screening exam initiated at 10:43 PM.  Appropriate orders placed.  [name] was informed that the remainder of the evaluation will be completed by another provider, this initial triage assessment does not replace that evaluation, and the importance of remaining in the ED until their evaluation is complete.  Patient has what appears to be symptoms that have resolved, blood pressure is elevated, EKG shows sinus rhythm rate of 63 bpm, normal axis, normal intervals, normal ST segments, normal T waves, there does appear to be left ventricular hypertrophy with expected repolarization   [name], MD 10/17/22 2245

## 2022-10-17 NOTE — ED Provider Notes (Signed)
[facility] EMERGENCY DEPARTMENT Provider Note   CSN: [id number] Arrival date & time: 10/17/22  2223     History {Add pertinent medical, surgical, social history, OB history to HPI:1} Chief Complaint  Patient presents with   Chest Pain   Tachycardia    [name] is a 66 y.o. female.  The history is provided by the patient and the EMS personnel.  Chest Pain She has history of hypertension, hyperlipidemia, coronary artery disease, COPD, stroke, breast cancer and comes in because of an episode of chest pain and palpitations.  She states that it felt like her heart was beating out of her chest and she was having some chest pain with associated dyspnea.  She denies nausea, vomiting, diaphoresis.  EMS was called and reported initial heart rate of 130, but when they got her in the ambulance, her heart rate was down to 92 and she was no longer complaining of chest pain.  She states that she is completely pain-free now.   Home Medications Prior to Admission medications   Medication Sig Start Date End Date Taking? Authorizing Provider  albuterol (PROVENTIL HFA;VENTOLIN HFA) 108 (90 BASE) MCG/ACT inhaler Inhale 2 puffs into the lungs every 6 (six) hours as needed for wheezing. For wheezing    [provider]  amLODipine (NORVASC) 5 MG tablet Take 1 tablet (5 mg total) by mouth daily. 10/24/18   Barton Dubois, MD  anastrozole (ARIMIDEX) 1 MG tablet Take 1 tablet (1 mg total) by mouth daily. 01/22/21   [name], MD  aspirin 81 MG EC tablet Take 81 mg by mouth daily.      [provider]  atorvastatin (LIPITOR) 20 MG tablet Take 1 tablet (20 mg total) by mouth daily. 12/26/19   [name], MD  carvedilol (COREG) 6.25 MG tablet Take 1 tablet (6.25 mg total) by mouth 2 (two) times daily with a meal. 12/26/19   [name], MD  clopidogrel (PLAVIX) 75 MG tablet Take 75 mg by mouth daily.     [provider]  lisinopril (PRINIVIL,ZESTRIL) 40 MG tablet Take 1 tablet (40  mg total) by mouth daily. 10/25/18   Barton Dubois, MD  metoCLOPramide (REGLAN) 5 MG tablet Take 1 tablet (5 mg total) by mouth every 8 (eight) hours as needed for nausea. 12/24/18   [name], MD  nitroGLYCERIN (NITROSTAT) 0.4 MG SL tablet Place 0.4 mg under the tongue every 5 (five) minutes as needed for chest pain.     [provider]  ondansetron (ZOFRAN) 4 MG tablet Take 1 tablet (4 mg total) by mouth every 8 (eight) hours as needed for nausea or vomiting. 10/31/21   Marcello Fennel, PA-C  potassium chloride SA (K-DUR,KLOR-CON) 20 MEQ tablet Take 1 tablet (20 mEq total) by mouth daily. 10/24/18   Barton Dubois, MD  traMADol Veatrice Bourbon) 50 MG tablet Take 1 or 2 po Q 6hrs for pain 04/11/20   Rolland Porter, MD      Allergies    Bee venom and Other    Review of Systems   Review of Systems  Cardiovascular:  Positive for chest pain.  All other systems reviewed and are negative.   Physical Exam Updated Vital Signs BP (!) 179/107 (BP Location: Left Arm)   Pulse 69   Temp (!) 97.5 F (36.4 C) (Oral)   Resp 18   Ht 5' 4.5" (1.638 m)   Wt 43.5 kg   SpO2 99%   BMI 16.22 kg/m  Physical Exam Vitals  and nursing note reviewed.   66 year old female, resting comfortably and in no acute distress. Vital signs are significant for elevated blood pressure. Oxygen saturation is 99%, which is normal. Head is normocephalic and atraumatic. PERRLA, EOMI. Oropharynx is clear. Neck is nontender and supple without adenopathy or JVD. Back is nontender and there is no CVA tenderness. Lungs are clear without rales, wheezes, or rhonchi. Chest is nontender. Heart has regular rate and rhythm without murmur. Abdomen is soft, flat, nontender. Extremities have no cyanosis or edema, full range of motion is present. Skin is warm and dry without rash. Neurologic: Awake, alert, oriented.  ED Results / Procedures / Treatments   Labs (all labs ordered are listed, but only abnormal results are  displayed) Labs Reviewed  BASIC METABOLIC PANEL  CBC  CBG MONITORING, ED  TROPONIN I (HIGH SENSITIVITY)    EKG EKG Interpretation  Date/Time:  Friday October 17 2022 22:36:21 EST Ventricular Rate:  63 PR Interval:  186 QRS Duration: 99 QT Interval:  428 QTC Calculation: 439 R Axis:   84 Text Interpretation: Sinus rhythm Borderline right axis deviation Probable left ventricular hypertrophy Anterior ST elevation, probably due to LVH When compared with ECG of 10/31/2021 No significant change was found Confirmed by Delora Fuel ([id number]) on 10/17/2022 11:03:44 PM  Radiology No results found.  Procedures Procedures  Cardiac monitor shows normal sinus rhythm, per my interpretation.  Medications Ordered in ED Medications  aspirin chewable tablet 324 mg (has no administration in time range)    ED Course/ Medical Decision Making/ A&P                           Medical Decision Making Amount and/or Complexity of Data Reviewed Labs: ordered. Radiology: ordered.   Episode of tachycardia with chest pain.  Pain seems clearly associated with the time that she was tachycardic, so I suspect that this was demand ischemia.  I have reviewed and interpreted her electrocardiogram, my interpretation is left ventricular hypertrophy with repolarization changes unchanged from prior.  I have reviewed her past records, and on 04/25/2019 she had a nuclear stress test showing normal ejection fraction, normal left ventricular wall motion, no reversible ischemia and was felt to be a low risk study.  Echocardiogram on 10/23/2018 showed normal ejection fraction and normal left ventricular diastolic parameters.  I see no record of any tachyarrhythmias in the patient's record, but I am not able to see her cardiologist's records.  Since she is pain-free and with normal sinus rhythm now, I have ordered serial troponins.  Petra Kuba of the tachyarrhythmia is unclear.  Consider sinus tachycardia, paroxysmal atrial  fibrillation, paroxysmal supraventricular tachycardia.  {Document critical care time when appropriate:1} {Document review of labs and clinical decision tools ie heart score, Chads2Vasc2 etc:1}  {Document your independent review of radiology images, and any outside records:1} {Document your discussion with family members, caretakers, and with consultants:1} {Document social determinants of health affecting pt's care:1} {Document your decision making why or why not admission, treatments were needed:1} Final Clinical Impression(s) / ED Diagnoses Final diagnoses:  None    Rx / DC Orders ED Discharge Orders     None

## 2022-10-17 NOTE — ED Triage Notes (Signed)
EMS states they were called out to pts home for "fast heart rate". States when they arrived, pt was tachycardic @ 133 and was c/o Chest pain. Stated that after they were there for a bit, HR was 92 and pt denied CP.

## 2022-10-18 ENCOUNTER — Observation Stay (HOSPITAL_BASED_OUTPATIENT_CLINIC_OR_DEPARTMENT_OTHER): Payer: Medicare Other

## 2022-10-18 DIAGNOSIS — E876 Hypokalemia: Secondary | ICD-10-CM | POA: Diagnosis not present

## 2022-10-18 DIAGNOSIS — I2510 Atherosclerotic heart disease of native coronary artery without angina pectoris: Secondary | ICD-10-CM

## 2022-10-18 DIAGNOSIS — I10 Essential (primary) hypertension: Secondary | ICD-10-CM

## 2022-10-18 DIAGNOSIS — R7989 Other specified abnormal findings of blood chemistry: Secondary | ICD-10-CM | POA: Diagnosis not present

## 2022-10-18 DIAGNOSIS — R079 Chest pain, unspecified: Secondary | ICD-10-CM | POA: Diagnosis not present

## 2022-10-18 DIAGNOSIS — R0789 Other chest pain: Secondary | ICD-10-CM | POA: Diagnosis not present

## 2022-10-18 DIAGNOSIS — R627 Adult failure to thrive: Secondary | ICD-10-CM

## 2022-10-18 DIAGNOSIS — R636 Underweight: Secondary | ICD-10-CM | POA: Insufficient documentation

## 2022-10-18 DIAGNOSIS — E782 Mixed hyperlipidemia: Secondary | ICD-10-CM

## 2022-10-18 DIAGNOSIS — J449 Chronic obstructive pulmonary disease, unspecified: Secondary | ICD-10-CM

## 2022-10-18 DIAGNOSIS — Z8673 Personal history of transient ischemic attack (TIA), and cerebral infarction without residual deficits: Secondary | ICD-10-CM

## 2022-10-18 LAB — BASIC METABOLIC PANEL
Anion gap: 10 (ref 5–15)
Anion gap: 5 (ref 5–15)
BUN: 12 mg/dL (ref 8–23)
BUN: 9 mg/dL (ref 8–23)
CO2: 28 mmol/L (ref 22–32)
CO2: 27 mmol/L (ref 22–32)
Calcium: 9.8 mg/dL (ref 8.9–10.3)
Calcium: 8.9 mg/dL (ref 8.9–10.3)
Chloride: 102 mmol/L (ref 98–111)
Chloride: 109 mmol/L (ref 98–111)
Creatinine, Ser: 0.96 mg/dL (ref 0.44–1.00)
Creatinine, Ser: 0.91 mg/dL (ref 0.44–1.00)
GFR, Estimated: >60 mL/min (ref >60)
GFR, Estimated: >60 mL/min (ref >60)
Glucose, Bld: 114 mg/dL — ABNORMAL HIGH (ref 70–99)
Glucose, Bld: 100 mg/dL — ABNORMAL HIGH (ref 70–99)
Potassium: 2.7 mmol/L — CL (ref 3.5–5.1)
Potassium: 4.1 mmol/L (ref 3.5–5.1)
Sodium: 140 mmol/L (ref 135–145)
Sodium: 141 mmol/L (ref 135–145)

## 2022-10-18 LAB — LIPID PANEL
Cholesterol: 180 mg/dL (ref 0–200)
HDL: 50 mg/dL (ref >40)
LDL Cholesterol: 117 mg/dL — ABNORMAL HIGH (ref 0–99)
Total CHOL/HDL Ratio: 3.6 RATIO
Triglycerides: 64 mg/dL (ref <150)
VLDL: 13 mg/dL (ref 0–40)

## 2022-10-18 LAB — TROPONIN I (HIGH SENSITIVITY)
Troponin I (High Sensitivity): 10 ng/L (ref <18)
Troponin I (High Sensitivity): 21 ng/L — ABNORMAL HIGH (ref <18)
Troponin I (High Sensitivity): 19 ng/L — ABNORMAL HIGH (ref <18)
Troponin I (High Sensitivity): 15 ng/L (ref <18)

## 2022-10-18 LAB — ECHOCARDIOGRAM COMPLETE
Area-P 1/2: 3.66 cm2
Height: 64.5 in
S' Lateral: 2.5 cm
Weight: 1536 oz

## 2022-10-18 LAB — MAGNESIUM
Magnesium: 2.3 mg/dL (ref 1.7–2.4)
Magnesium: 2.1 mg/dL (ref 1.7–2.4)

## 2022-10-18 LAB — HIV ANTIBODY (ROUTINE TESTING W REFLEX): HIV Screen 4th Generation wRfx: NONREACTIVE

## 2022-10-18 MED ORDER — CLOPIDOGREL BISULFATE 75 MG PO TABS
75.0000 mg | ORAL_TABLET | Freq: Every day | ORAL | Status: DC
  Administered 2022-10-18 – 2022-10-19 (×2): 75 mg via ORAL
  Filled 2022-10-18 (×2): qty 1

## 2022-10-18 MED ORDER — ASPIRIN 81 MG PO CHEW: 324.0000 mg | CHEWABLE_TABLET | Freq: Once | ORAL | Status: DC

## 2022-10-18 MED ORDER — ASPIRIN 81 MG PO TBEC
81.0000 mg | DELAYED_RELEASE_TABLET | Freq: Every day | ORAL | Status: DC
  Administered 2022-10-18 – 2022-10-19 (×2): 81 mg via ORAL
  Filled 2022-10-18 (×2): qty 1

## 2022-10-18 MED ORDER — ENOXAPARIN SODIUM 30 MG/0.3ML IJ SOSY
30.0000 mg | PREFILLED_SYRINGE | Freq: Every day | INTRAMUSCULAR | Status: DC
  Administered 2022-10-18 – 2022-10-19 (×2): 30 mg via SUBCUTANEOUS
  Filled 2022-10-18 (×2): qty 0.3

## 2022-10-18 MED ORDER — ATORVASTATIN CALCIUM 20 MG PO TABS
20.0000 mg | ORAL_TABLET | Freq: Every day | ORAL | Status: DC
  Administered 2022-10-18 – 2022-10-19 (×2): 20 mg via ORAL
  Filled 2022-10-18 (×2): qty 1

## 2022-10-18 MED ORDER — LISINOPRIL 10 MG PO TABS
40.0000 mg | ORAL_TABLET | Freq: Every day | ORAL | Status: DC
  Administered 2022-10-18 – 2022-10-19 (×2): 40 mg via ORAL
  Filled 2022-10-18 (×2): qty 4

## 2022-10-18 MED ORDER — ENOXAPARIN SODIUM 40 MG/0.4ML IJ SOSY: 40.0000 mg | PREFILLED_SYRINGE | INTRAMUSCULAR | Status: DC

## 2022-10-18 MED ORDER — AMLODIPINE BESYLATE 5 MG PO TABS
5.0000 mg | ORAL_TABLET | Freq: Every day | ORAL | Status: DC
  Administered 2022-10-18 – 2022-10-19 (×2): 5 mg via ORAL
  Filled 2022-10-18 (×2): qty 1

## 2022-10-18 MED ORDER — NITROGLYCERIN 0.4 MG SL SUBL: 0.4000 mg | SUBLINGUAL_TABLET | SUBLINGUAL | Status: DC | PRN

## 2022-10-18 MED ORDER — ALBUTEROL SULFATE (2.5 MG/3ML) 0.083% IN NEBU: 2.5000 mg | INHALATION_SOLUTION | Freq: Four times a day (QID) | RESPIRATORY_TRACT | Status: DC | PRN

## 2022-10-18 MED ORDER — POTASSIUM CHLORIDE CRYS ER 20 MEQ PO TBCR
40.0000 meq | EXTENDED_RELEASE_TABLET | Freq: Once | ORAL | Status: AC
  Administered 2022-10-18: 40 meq via ORAL
  Filled 2022-10-18: qty 2

## 2022-10-18 MED ORDER — POTASSIUM CHLORIDE 10 MEQ/100ML IV SOLN
10.0000 meq | INTRAVENOUS | Status: AC
  Administered 2022-10-18 (×2): 10 meq via INTRAVENOUS
  Filled 2022-10-18 (×2): qty 100

## 2022-10-18 MED ORDER — CARVEDILOL 3.125 MG PO TABS
6.2500 mg | ORAL_TABLET | Freq: Two times a day (BID) | ORAL | Status: DC
  Administered 2022-10-18 – 2022-10-19 (×2): 6.25 mg via ORAL
  Filled 2022-10-18 (×2): qty 2

## 2022-10-18 MED ORDER — ENSURE ENLIVE PO LIQD
237.0000 mL | Freq: Two times a day (BID) | ORAL | Status: DC
  Administered 2022-10-18 – 2022-10-19 (×3): 237 mL via ORAL
  Filled 2022-10-18 (×6): qty 237

## 2022-10-18 NOTE — H&P (Signed)
History and Physical    Patient: [name] ZOX:[id number] DOB: 02-25-1956 DOA: 10/17/2022 DOS: the patient was seen and examined on 10/18/2022 PCP: [name], MD  Patient coming from: Home  Chief Complaint:  Chief Complaint  Patient presents with   Chest Pain   Tachycardia   HPI: [name] is a 66 y.o. female with medical history significant of hypertension, hyperlipidemia, CAD, COPD, stroke and breast cancer who presents to the emergency department via EMS due to " increased heart rate", she felt that her heart was going to jump out of her chest, she took 3 nitroglycerin sublingual tablets at 5-minute intervals without immediate relief, EMS was activated and on arrival of EMS team, HR was 130, but by the time she was taken to the ambulance, heart rate has normalized to 92 and the chest pain resolved.  Patient continues to be chest pain-free on arrival to the ED.  ED Course:  In the emergency department, temperature was 97.5 F, BP was 179/107, but other vital signs were within normal range.  Workup in the ED showed: CBC: WBC 8.6, hemoglobin 11.8, hematocrit 35.4, MCV 85.9, platelets 309 BMP: Sodium 140, potassium 2.7, chloride 102, bicarb 28, blood glucose 114, BUN 12, creatinine 0.96 Magnesium 2.3 Troponin x 2 - 10 > 21 Chest x-ray showed no acute intrathoracic process Patient was treated with aspirin 324 mg x 1, potassium was replenished.  Hospitalist was asked to admit patient for further evaluation and management.  Review of Systems: Review of systems as noted in the HPI. All other systems reviewed and are negative.   Past Medical History:  Diagnosis Date   Adjustment disorder with depressed mood 01/11/2009   Arthritis    Asthma    Breast cancer ([place])    left breast   CAD 07/10/2008   Chronic back pain    COCAINE DEPENDENCY NOS 01/14/2007   stopped Cocaine 15 years    CONVULSIONS, SEIZURES, NOS 01/14/2007   last seizure May, 2016   COPD (chronic obstructive  pulmonary disease) ([place])    Coronary artery disease 01/14/2012   CVA 01/14/2007   Difficulty seeing    patient has glaucoma   Family history of anesthesia complication 8119   Pts sister (60 years old) died after having anesthesia - induced a heart attcks   Glaucoma of both eyes    Headache(784.0)    HEPATITIS C 02/26/2009   Hepatitis C    Hyperlipemia    HYPERTENSION, BENIGN SYSTEMIC 01/14/2007   Memory loss    d/t CVA   MI (myocardial infarction) ([place]) 12/20/2007   had stent placed   Pneumonia    hx of   PTSD (post-traumatic stress disorder)    Right sided weakness    d/t stroke   Sciatica    Stroke [facility])    right side weakness   TOBACCO DEPENDENCE 01/14/2007   Past Surgical History:  Procedure Laterality Date   BREAST LUMPECTOMY Left 09/18/2017   Procedure: EVACUATION HEMATOMA  LEFT BREAST;  Surgeon: [name], MD;  Location: [place];  Service: General;  Laterality: Left;   CATARACT EXTRACTION W/PHACO Right 07/11/2015   Procedure: CATARACT EXTRACTION PHACO AND INTRAOCULAR LENS PLACEMENT ([place]) RIGHT,INTERNAL PLEB REVISION;  Surgeon: Marylynn Pearson, MD;  Location: [place];  Service: Ophthalmology;  Laterality: Right;   CESAREAN SECTION     x 2   COLONOSCOPY W/ POLYPECTOMY     CORONARY ANGIOPLASTY WITH STENT PLACEMENT  2009   1 stent   EYE SURGERY  MASTECTOMY W/ SENTINEL NODE BIOPSY Left 09/08/2017   Procedure: LEFT MASTECTOMY WITH SENTINEL LYMPH NODE BIOPSY;  Surgeon: [name], MD;  Location: [place];  Service: General;  Laterality: Left;   MITOMYCIN C APPLICATION Right 61/95/0932   Procedure: MITOMYCIN C APPLICATION RIGHT EYE;  Surgeon: Marylynn Pearson, MD;  Location: [place];  Service: Ophthalmology;  Laterality: Right;   TONSILLECTOMY     TRABECULECTOMY Left 06/29/2013   Procedure: TRABECULECTOMY WITH MYTOMICIN C LEFT EYE;  Surgeon: Marylynn Pearson, MD;  Location: [place];  Service: Ophthalmology;  Laterality: Left;   TRABECULECTOMY Right 09/07/2013   Procedure:  TRABECULECTOMY RIGHT EYE;  Surgeon: Marylynn Pearson, MD;  Location: [place];  Service: Ophthalmology;  Laterality: Right;    Social History:  reports that she has been smoking cigarettes. She has a 11.75 pack-year smoking history. She has never used smokeless tobacco. She reports that she does not drink alcohol and does not use drugs.   Allergies  Allergen Reactions   Bee Venom Anaphylaxis   Other     Patient states she has been told not to take ibuprofen and tylenol    Family History  Problem Relation Age of Onset   Stroke Mother    Colon cancer Paternal Uncle    Heart disease Sister    Colon cancer Paternal Aunt      Prior to Admission medications   Medication Sig Start Date End Date Taking? Authorizing Provider  albuterol (PROVENTIL HFA;VENTOLIN HFA) 108 (90 BASE) MCG/ACT inhaler Inhale 2 puffs into the lungs every 6 (six) hours as needed for wheezing. For wheezing    [provider]  amLODipine (NORVASC) 5 MG tablet Take 1 tablet (5 mg total) by mouth daily. 10/24/18   Barton Dubois, MD  anastrozole (ARIMIDEX) 1 MG tablet Take 1 tablet (1 mg total) by mouth daily. 01/22/21   [name], MD  aspirin 81 MG EC tablet Take 81 mg by mouth daily.      [provider]  atorvastatin (LIPITOR) 20 MG tablet Take 1 tablet (20 mg total) by mouth daily. 12/26/19   [name], MD  carvedilol (COREG) 6.25 MG tablet Take 1 tablet (6.25 mg total) by mouth 2 (two) times daily with a meal. 12/26/19   [name], MD  clopidogrel (PLAVIX) 75 MG tablet Take 75 mg by mouth daily.     [provider]  lisinopril (PRINIVIL,ZESTRIL) 40 MG tablet Take 1 tablet (40 mg total) by mouth daily. 10/25/18   Barton Dubois, MD  metoCLOPramide (REGLAN) 5 MG tablet Take 1 tablet (5 mg total) by mouth every 8 (eight) hours as needed for nausea. 12/24/18   [name], MD  nitroGLYCERIN (NITROSTAT) 0.4 MG SL tablet Place 0.4 mg under the tongue every 5 (five) minutes as needed for chest pain.      [provider]  ondansetron (ZOFRAN) 4 MG tablet Take 1 tablet (4 mg total) by mouth every 8 (eight) hours as needed for nausea or vomiting. 10/31/21   Marcello Fennel, PA-C  potassium chloride SA (K-DUR,KLOR-CON) 20 MEQ tablet Take 1 tablet (20 mEq total) by mouth daily. 10/24/18   Barton Dubois, MD  traMADol Veatrice Bourbon) 50 MG tablet Take 1 or 2 po Q 6hrs for pain 04/11/20   Rolland Porter, MD    Physical Exam: BP (!) 168/99   Pulse 72   Temp 98 F (36.7 C) (Oral)   Resp 12   Ht 5' 4.5" (1.638 m)   Wt 43.5 kg  SpO2 98%   BMI 16.22 kg/m   General: 66 y.o. year-old female cachectic, but in no acute distress.  Alert and oriented x3. HEENT: NCAT, EOMI Neck: Supple, trachea medial Cardiovascular: Regular rate and rhythm with no rubs or gallops.  No thyromegaly or JVD noted.  No lower extremity edema. 2/4 pulses in all 4 extremities. Respiratory: Clear to auscultation with no wheezes or rales. Good inspiratory effort. Abdomen: Soft, nontender nondistended with normal bowel sounds x4 quadrants. Muskuloskeletal: No cyanosis, clubbing or edema noted bilaterally Neuro: CN II-XII intact, strength 5/5 x 4, sensation, reflexes intact Skin: No ulcerative lesions noted or rashes Psychiatry: Judgement and insight appear normal. Mood is appropriate for condition and setting          Labs on Admission:  Basic Metabolic Panel: Recent Labs  Lab 10/17/22 2253  NA 140  K 2.7*  CL 102  CO2 28  GLUCOSE 114*  BUN 12  CREATININE 0.96  CALCIUM 9.8  MG 2.3   Liver Function Tests: No results for input(s): "AST", "ALT", "ALKPHOS", "BILITOT", "PROT", "ALBUMIN" in the last 168 hours. No results for input(s): "LIPASE", "AMYLASE" in the last 168 hours. No results for input(s): "AMMONIA" in the last 168 hours. CBC: Recent Labs  Lab 10/17/22 2253  WBC 8.6  HGB 11.8*  HCT 35.4*  MCV 85.9  PLT 309   Cardiac Enzymes: No results for input(s): "CKTOTAL", "CKMB", "CKMBINDEX", "TROPONINI"  in the last 168 hours.  BNP (last 3 results) No results for input(s): "BNP" in the last 8760 hours.  ProBNP (last 3 results) No results for input(s): "PROBNP" in the last 8760 hours.  CBG: Recent Labs  Lab 10/17/22 2306  GLUCAP 124*    Radiological Exams on Admission: DG Chest Portable 1 View  Result Date: 10/17/2022 CLINICAL DATA:  Tachycardia, chest pain EXAM: PORTABLE CHEST 1 VIEW COMPARISON:  10/22/2018 FINDINGS: Single frontal view of the chest demonstrates a stable cardiac silhouette. No airspace disease, effusion, or pneumothorax. No acute bony abnormality. IMPRESSION: 1. No acute intrathoracic process. Electronically Signed   By: Randa Ngo M.D.   On: 10/17/2022 23:05    EKG: I independently viewed the EKG done and my findings are as followed: Normal sinus rhythm at a rate of 63 bpm  Assessment/Plan Present on Admission:  Atypical chest pain  Hypokalemia  Essential hypertension  Mixed hyperlipidemia  CAD (coronary artery disease)  Principal Problem:   Atypical chest pain Active Problems:   Essential hypertension   CAD (coronary artery disease)   Mixed hyperlipidemia   Hypokalemia   Elevated troponin   Failure to thrive in adult   Underweight   COPD (chronic obstructive pulmonary disease) (HCC)   History of stroke  Atypical chest pain rule out ACS Elevated troponin possibly secondary to type II demand ischemia Cardiovascular risk factors include hypertension, hyperlipidemia, CAD Continue telemetry  Troponins x2 - 10 > 21, patient is currently chest pain-free.  Continue to trend troponin EKG showed normal sinus rhythm at rate of 63 bpm Echocardiogram done on 10/23/2018 showed LVEF of 60 to 70%.  Normal wall motion.  No RWMA.  LV diastolic function parameters were normal.  Echocardiogram will be done in the morning Continue aspirin, nitroglycerin prn  Hypokalemia K+ is 2.7 K+ will be replenished Please monitor for AM K+ for further  replenishmemnt  Failure to thrive in adult Underweight (BMI 16.22) Protein supplement to be provided Consult dietitian and await further recommendation  Essential hypertension Continue amlodipine and Coreg  Mixed hyperlipidemia  Continue Lipitor  CAD Continue aspirin, Coreg, Lipitor  COPD Continue Ventolin HFA as needed  History of stroke Continue aspirin, Plavix, Lipitor  DVT prophylaxis: Lovenox  Code Status: Full code  Family Communication: None at bedside  Consults: None  Severity of Illness: The appropriate patient status for this patient is OBSERVATION. Observation status is judged to be reasonable and necessary in order to provide the required intensity of service to ensure the patient's safety. The patient's presenting symptoms, physical exam findings, and initial radiographic and laboratory data in the context of their medical condition is felt to place them at decreased risk for further clinical deterioration. Furthermore, it is anticipated that the patient will be medically stable for discharge from the hospital within 2 midnights of admission.   Author: Bernadette Hoit, DO 10/18/2022 6:48 AM  For on call review [email or web address].

## 2022-10-18 NOTE — Discharge Instructions (Signed)
Patient needs to follow up with a podiatrist for very ingrown nails wrapping around and into the nail bed underneath her toes. Multiple affected toes. We cannot refer patient to podiatry from hospital, but [facility] can. At patient's next PCP visit, please ask for referral.  As a reminder, do not attempt to cut these toes with tools not meant for toe nail trimming.

## 2022-10-18 NOTE — Progress Notes (Signed)
Patient has toenails growing and wrapping around the toe, cutting in to the base of the toe. They are very painful to the touch. Patient reported attempting to cut with wire cutters previously. Educated on use of only proper tools and need for podiatry consult. Case management can not assist and podiatry referrals not made from inpatient stand point. Attempted to contact family, but none of the contact numbers listed resulted in any luck. Patient's primary care physician's office number is actually an after hours care line for emergency physician to physician cardiac consults.

## 2022-10-18 NOTE — Hospital Course (Signed)
66 year old female with a history of hypertension, hyperlipidemia, coronary disease, stroke, COPD, left breast cancer presenting with substernal chest discomfort and palpitations that began on the evening of 10/17/2022.  The patient states that she was sitting on her recliner speaking with her sister when the episode happened.  She stated that she felt her heart was going to "jump out of my chest".  she took 3 nitroglycerin sublingual tablets at 5-minute intervals without immediate relief, EMS was activated and on arrival of EMS team, HR was 130, but by the time she was taken to the ambulance, heart rate has normalized to 92 and the chest pain resolved.  Patient continues to be chest pain-free on arrival to the ED.  The patient denies any recent anginal type symptoms.  She denies any recent worsening shortness of breath or dyspnea on exertion.  She endorses compliance with all her medications.  She has not had any fever, chills, nausea, vomiting, direct abdominal pain, dysuria, hematuria. In the ED, the patient was afebrile hemodynamic stable with oxygen saturation 100% room air.  BMP showed sodium 140, potassium 2.7, bicarbonate 28, serum creatinine 0.96.  Magnesium 2.3.  WBC 8.6, hemoglobin 11.8, platelets 309,000.  Troponin 10>>21.  She has not had any recurrence of her chest discomfort or tachycardia.  The patient was admitted for further evaluation and treatment of her chest pain and hypokalemia.

## 2022-10-18 NOTE — Progress Notes (Signed)
  Echocardiogram 2D Echocardiogram has been performed.  Darlina Sicilian M 10/18/2022, 9:03 AM

## 2022-10-18 NOTE — Progress Notes (Signed)
  Transition of Care [facility]) Screening Note   Patient Details  Name: [name] Date of Birth: 04-23-1956   Transition of Care [facility]) CM/SW Contact:    Iona Beard, [place] Phone Number: 10/18/2022, 11:59 AM    Transition of Care Department [facility]) has reviewed patient and no TOC needs have been identified at this time. We will continue to monitor patient advancement through interdisciplinary progression rounds. If new patient transition needs arise, please place a TOC consult.

## 2022-10-18 NOTE — Care Management Obs Status (Signed)
[place] NOTIFICATION   Patient Details  Name: [name] MRN: [id number] Date of Birth: May 30, 1956   Medicare Observation Status Notification Given:  Yes    Iona Beard, [place] 10/18/2022, 12:04 PM

## 2022-10-18 NOTE — ED Notes (Signed)
Date and time results received: 10/18/22 0008 (use smartphrase ".now" to insert current time)  Test: Potassium Critical Value: 2.7  Name of Provider Notified: Peggye Pitt, MD

## 2022-10-18 NOTE — Plan of Care (Signed)

## 2022-10-18 NOTE — Progress Notes (Signed)
PROGRESS NOTE  [name] [id number] DOB: 12/01/1955 DOA: 10/17/2022 PCP: [name], MD  Brief History:  66 year old female with a history of hypertension, hyperlipidemia, coronary disease, stroke, COPD, left breast cancer presenting with substernal chest discomfort and palpitations that began on the evening of 10/17/2022.  The patient states that she was sitting on her recliner speaking with her sister when the episode happened.  She stated that she felt her heart was going to "jump out of my chest".  she took 3 nitroglycerin sublingual tablets at 5-minute intervals without immediate relief, EMS was activated and on arrival of EMS team, HR was 130, but by the time she was taken to the ambulance, heart rate has normalized to 92 and the chest pain resolved.  Patient continues to be chest pain-free on arrival to the ED.  The patient denies any recent anginal type symptoms.  She denies any recent worsening shortness of breath or dyspnea on exertion.  She endorses compliance with all her medications.  She has not had any fever, chills, nausea, vomiting, direct abdominal pain, dysuria, hematuria. In the ED, the patient was afebrile hemodynamic stable with oxygen saturation 100% room air.  BMP showed sodium 140, potassium 2.7, bicarbonate 28, serum creatinine 0.96.  Magnesium 2.3.  WBC 8.6, hemoglobin 11.8, platelets 309,000.  Troponin 10>>21.  She has not had any recurrence of her chest discomfort or tachycardia.  The patient was admitted for further evaluation and treatment of her chest pain and hypokalemia.     Assessment/Plan: Chest pain -Primarily atypical by clinical history -Troponin 10>>21 -Echocardiogram -Continue aspirin -Check lipids -Check A1C  Hypokalemia -Presented with potassium 2.7 -Repeat BMP  Tachycardia -noted by EMS -rhythm strip not available -remain on tele -no recurrence in ED  Essential Hypertension -continue amlodipine, coreg, lisinopril  Tobacco  abuse -She continues to smoke 1 pack/week -She has smoked over 50 years -Tobacco cessation discussed  Mixed hyperlipidemia -Continue Lipitor  Coronary artery disease -Continue aspirin, plavix, carvedilol, Lipitor  COPD -Stable on room air  Underweight -BMI 16.22  Left breast cancer -Status post left mastectomy 2018 -Continue anastrozole -Follow-up with Dr. Lindi Adie       Family Communication:   no Family at bedside  Consultants:  none  Code Status:  FULL   DVT Prophylaxis:  [place] Lovenox   Procedures: As Listed in Progress Note Above  Antibiotics: None      Subjective: Patient denies fevers, chills, headache, chest pain, dyspnea, nausea, vomiting, diarrhea, abdominal pain, dysuria, hematuria, hematochezia, and melena.   Objective: Vitals:   10/17/22 2228 10/17/22 2231 10/18/22 0615 10/18/22 0630  BP:  (!) 179/107  (!) 168/99  Pulse:  69  72  Resp:  '18 13 12  '$ Temp:  (!) 97.5 F (36.4 C)  98 F (36.7 C)  TempSrc:  Oral  Oral  SpO2:  99%  98%  Weight: 43.5 kg     Height: 5' 4.5" (1.638 m)       Intake/Output Summary (Last 24 hours) at 10/18/2022 0820 Last data filed at 10/18/2022 0402 Gross per 24 hour  Intake 200 ml  Output --  Net 200 ml   Weight change:  Exam:  General:  Pt is alert, follows commands appropriately, not in acute distress HEENT: No icterus, No thrush, No neck mass, [place]/AT Cardiovascular: RRR, S1/S2, no rubs, no gallops Respiratory: CTA bilaterally, no wheezing, no crackles, no rhonchi Abdomen: Soft/+BS, non tender, non distended, no guarding Extremities:  No edema, No lymphangitis, No petechiae, No rashes, no synovitis   Data Reviewed: I have personally reviewed following labs and imaging studies Basic Metabolic Panel: Recent Labs  Lab 10/17/22 2253  NA 140  K 2.7*  CL 102  CO2 28  GLUCOSE 114*  BUN 12  CREATININE 0.96  CALCIUM 9.8  MG 2.3   Liver Function Tests: No results for input(s): "AST", "ALT", "ALKPHOS",  "BILITOT", "PROT", "ALBUMIN" in the last 168 hours. No results for input(s): "LIPASE", "AMYLASE" in the last 168 hours. No results for input(s): "AMMONIA" in the last 168 hours. Coagulation Profile: No results for input(s): "INR", "PROTIME" in the last 168 hours. CBC: Recent Labs  Lab 10/17/22 2253  WBC 8.6  HGB 11.8*  HCT 35.4*  MCV 85.9  PLT 309   Cardiac Enzymes: No results for input(s): "CKTOTAL", "CKMB", "CKMBINDEX", "TROPONINI" in the last 168 hours. BNP: Invalid input(s): "POCBNP" CBG: Recent Labs  Lab 10/17/22 2306  GLUCAP 124*   HbA1C: No results for input(s): "HGBA1C" in the last 72 hours. Urine analysis:    Component Value Date/Time   COLORURINE YELLOW 10/31/2021 2017   APPEARANCEUR CLEAR 10/31/2021 2017   LABSPEC 1.015 10/31/2021 2017   PHURINE 6.0 10/31/2021 2017   GLUCOSEU NEGATIVE 10/31/2021 2017   HGBUR NEGATIVE 10/31/2021 2017   BILIRUBINUR NEGATIVE 10/31/2021 2017   KETONESUR NEGATIVE 10/31/2021 2017   PROTEINUR NEGATIVE 10/31/2021 2017   UROBILINOGEN 1.0 05/12/2014 1930   NITRITE NEGATIVE 10/31/2021 2017   LEUKOCYTESUR NEGATIVE 10/31/2021 2017   Sepsis Labs: '@LABRCNTIP'$ (procalcitonin:4,lacticidven:4) )No results found for this or any previous visit (from the past 240 hour(s)).   Scheduled Meds:  enoxaparin (LOVENOX) injection  30 mg Subcutaneous Daily   feeding supplement  237 mL Oral BID BM   Continuous Infusions:  Procedures/Studies: DG Chest Portable 1 View  Result Date: 10/17/2022 CLINICAL DATA:  Tachycardia, chest pain EXAM: PORTABLE CHEST 1 VIEW COMPARISON:  10/22/2018 FINDINGS: Single frontal view of the chest demonstrates a stable cardiac silhouette. No airspace disease, effusion, or pneumothorax. No acute bony abnormality. IMPRESSION: 1. No acute intrathoracic process. Electronically Signed   By: Randa Ngo M.D.   On: 10/17/2022 23:05    Orson Eva, DO  [facility]  If 7PM-7AM, please contact  night-coverage [email or web address] Password [email or web address] 10/18/2022, 8:20 AM   LOS: 0 days

## 2022-10-19 DIAGNOSIS — R0789 Other chest pain: Secondary | ICD-10-CM | POA: Diagnosis not present

## 2022-10-19 DIAGNOSIS — E876 Hypokalemia: Secondary | ICD-10-CM | POA: Diagnosis not present

## 2022-10-19 DIAGNOSIS — I10 Essential (primary) hypertension: Secondary | ICD-10-CM | POA: Diagnosis not present

## 2022-10-19 DIAGNOSIS — R7989 Other specified abnormal findings of blood chemistry: Secondary | ICD-10-CM | POA: Diagnosis not present

## 2022-10-19 LAB — COMPREHENSIVE METABOLIC PANEL
ALT: 10 U/L (ref 0–44)
AST: 18 U/L (ref 15–41)
Albumin: 3.5 g/dL (ref 3.5–5.0)
Alkaline Phosphatase: 45 U/L (ref 38–126)
Anion gap: 5 (ref 5–15)
BUN: 9 mg/dL (ref 8–23)
CO2: 25 mmol/L (ref 22–32)
Calcium: 9.5 mg/dL (ref 8.9–10.3)
Chloride: 108 mmol/L (ref 98–111)
Creatinine, Ser: 0.87 mg/dL (ref 0.44–1.00)
GFR, Estimated: >60 mL/min (ref >60)
Glucose, Bld: 99 mg/dL (ref 70–99)
Potassium: 3.4 mmol/L — ABNORMAL LOW (ref 3.5–5.1)
Sodium: 138 mmol/L (ref 135–145)
Total Bilirubin: 0.5 mg/dL (ref 0.3–1.2)
Total Protein: 6.7 g/dL (ref 6.5–8.1)

## 2022-10-19 LAB — CBC
HCT: 32.6 % — ABNORMAL LOW (ref 36.0–46.0)
Hemoglobin: 11.2 g/dL — ABNORMAL LOW (ref 12.0–15.0)
MCH: 29.1 pg (ref 26.0–34.0)
MCHC: 34.4 g/dL (ref 30.0–36.0)
MCV: 84.7 fL (ref 80.0–100.0)
Platelets: 288 10*3/uL (ref 150–400)
RBC: 3.85 MIL/uL — ABNORMAL LOW (ref 3.87–5.11)
RDW: 13.5 % (ref 11.5–15.5)
WBC: 6.4 10*3/uL (ref 4.0–10.5)
nRBC: 0 % (ref 0.0–0.2)

## 2022-10-19 LAB — MAGNESIUM: Magnesium: 2.1 mg/dL (ref 1.7–2.4)

## 2022-10-19 LAB — PHOSPHORUS: Phosphorus: 3.3 mg/dL (ref 2.5–4.6)

## 2022-10-19 MED ORDER — POTASSIUM CHLORIDE CRYS ER 20 MEQ PO TBCR
20.0000 meq | EXTENDED_RELEASE_TABLET | Freq: Once | ORAL | Status: AC
  Administered 2022-10-19: 20 meq via ORAL
  Filled 2022-10-19: qty 1

## 2022-10-19 NOTE — Discharge Summary (Signed)
Physician Discharge Summary   Patient: [name] MRN: [id number] DOB: [birth year]  Admit date:     10/17/2022  Discharge date: 10/19/22  Discharge Physician: Shanon Brow Chrstopher Malenfant   PCP: [name], MD   Recommendations at discharge:   Please follow up with primary care provider within 1-2 weeks  Please repeat BMP and CBC in one week  Please follow up on/with podiatry--please make referral to podiatry for toe nail care     Hospital Course: 66 year old female with a history of hypertension, hyperlipidemia, coronary disease, stroke, COPD, left breast cancer presenting with substernal chest discomfort and palpitations that began on the evening of 10/17/2022.  The patient states that she was sitting on her recliner speaking with her sister when the episode happened.  She stated that she felt her heart was going to "jump out of my chest".  she took 3 nitroglycerin sublingual tablets at 5-minute intervals without immediate relief, EMS was activated and on arrival of EMS team, HR was 130, but by the time she was taken to the ambulance, heart rate has normalized to 92 and the chest pain resolved.  Patient continues to be chest pain-free on arrival to the ED.  The patient denies any recent anginal type symptoms.  She denies any recent worsening shortness of breath or dyspnea on exertion.  She endorses compliance with all her medications.  She has not had any fever, chills, nausea, vomiting, direct abdominal pain, dysuria, hematuria. In the ED, the patient was afebrile hemodynamic stable with oxygen saturation 100% room air.  BMP showed sodium 140, potassium 2.7, bicarbonate 28, serum creatinine 0.96.  Magnesium 2.3.  WBC 8.6, hemoglobin 11.8, platelets 309,000.  Troponin 10>>21.  She has not had any recurrence of her chest discomfort or tachycardia.  The patient was admitted for further evaluation and treatment of her chest pain and hypokalemia.    Assessment and Plan: Chest pain -Primarily atypical by clinical  history -Troponin 10>>21>19>>15 -Echo--EF 50-55%, no WMA, trivial MR -Continue aspirin and plavix -Check lipids LDL--117 -Check A1C--pending -no further cp during hospitalization and no tachycardia   Hypokalemia -Presented with potassium 2.7 -repleted -mag 2.1 Continue home daily KCl   Tachycardia -noted by EMS -rhythm strip not available -remained on tele -no recurrence during hospitalization   Essential Hypertension -continue amlodipine, coreg, lisinopril   Tobacco abuse -She continues to smoke 1 pack/week -She has smoked over 50 years -Tobacco cessation discussed   Mixed hyperlipidemia -Continue Lipitor   Coronary artery disease -Continue aspirin, plavix, carvedilol, Lipitor   COPD -Stable on room air   Underweight -BMI 16.22   Left breast cancer -Status post left mastectomy 2018 -Continue anastrozole -Follow-up with Dr. Lindi Adie          Consultants: none Procedures performed: none  Disposition: Home Diet recommendation:  Cardiac diet DISCHARGE MEDICATION: Allergies as of 10/19/2022       Reactions   Bee Venom Anaphylaxis   Other    Patient states she has been told not to take ibuprofen and tylenol        Medication List     STOP taking these medications    lisinopril 40 MG tablet Commonly known as: ZESTRIL       TAKE these medications    albuterol 108 (90 Base) MCG/ACT inhaler Commonly known as: VENTOLIN HFA Inhale 2 puffs into the lungs every 6 (six) hours as needed for wheezing. For wheezing   amLODipine 10 MG tablet Commonly known as: NORVASC Take 10 mg by mouth daily.  What changed: Another medication with the same name was removed. Continue taking this medication, and follow the directions you see here.   aspirin EC 81 MG tablet Take 81 mg by mouth daily.   atorvastatin 20 MG tablet Commonly known as: Lipitor Take 1 tablet (20 mg total) by mouth daily.   atorvastatin 40 MG tablet Commonly known as: LIPITOR Take 40  mg by mouth daily.   carbamazepine 200 MG tablet Commonly known as: TEGRETOL Take 200 mg by mouth 2 (two) times daily.   carvedilol 6.25 MG tablet Commonly known as: Coreg Take 1 tablet (6.25 mg total) by mouth 2 (two) times daily with a meal.   carvedilol 12.5 MG tablet Commonly known as: COREG Take 12.5 mg by mouth 2 (two) times daily.   clopidogrel 75 MG tablet Commonly known as: PLAVIX Take 75 mg by mouth daily.   lisinopril-hydrochlorothiazide 20-12.5 MG tablet Commonly known as: ZESTORETIC Take 1 tablet by mouth 2 (two) times daily.   nitroGLYCERIN 0.4 MG SL tablet Commonly known as: NITROSTAT Place 0.4 mg under the tongue every 5 (five) minutes as needed for chest pain.   potassium chloride SA 20 MEQ tablet Commonly known as: KLOR-CON M Take 1 tablet (20 mEq total) by mouth daily.        Follow-up Information     Charolette Forward, MD.   Specialty: Cardiology Contact information: [address] [place] [address] [phone]         [place].   Contact information: [place] [phone]               Discharge Exam: Danley Danker Weights   10/17/22 2228  Weight: 43.5 kg   HEENT:  [place]/AT, No thrush, no icterus CV:  RRR, no rub, no S3, no S4 Lung:  CTA, no wheeze, no rhonchi Abd:  soft/+BS, NT Ext:  No edema, no lymphangitis, no synovitis, no rash   Condition at discharge: stable  The results of significant diagnostics from this hospitalization (including imaging, microbiology, ancillary and laboratory) are listed below for reference.   Imaging Studies: ECHOCARDIOGRAM COMPLETE  Result Date: 10/18/2022    ECHOCARDIOGRAM REPORT   Patient Name:   [name] Date of Exam: 10/18/2022 Medical Rec #:  [id number]    Height:       64.5 in Accession #:    [id number]   Weight:       96.0 lb Date of Birth:  [birth year]    BSA:          1.440 m Patient Age:    87 years      BP:           155/77 mmHg Patient Gender: F            HR:           53 bpm. Exam Location:  Inpatient Procedure: 2D Echo, 3D Echo, Cardiac Doppler, Color Doppler and Strain Analysis Indications:    Chest Pain R07.9  History:        Patient has prior history of Echocardiogram examinations, most                 recent 10/23/2018. Previous Myocardial Infarction and CAD, Stroke                 and COPD; Risk Factors:Hypertension. Past history of breast  cancer.  Sonographer:    Darlina Sicilian RDCS Referring Phys: [id number] OLADAPO ADEFESO IMPRESSIONS  1. Left ventricular ejection fraction, by estimation, is 50 to 55%. Left ventricular ejection fraction by 3D volume is 50 %. The left ventricle has low normal function. The left ventricle has no regional wall motion abnormalities. Left ventricular diastolic parameters are indeterminate.  2. Right ventricular systolic function is low normal. The right ventricular size is normal.  3. The mitral valve is normal in structure. Trivial mitral valve regurgitation. No evidence of mitral stenosis.  4. The aortic valve is tricuspid. Aortic valve regurgitation is not visualized. Aortic valve sclerosis is present, with no evidence of aortic valve stenosis. FINDINGS  Left Ventricle: Left ventricular ejection fraction, by estimation, is 50 to 55%. Left ventricular ejection fraction by 3D volume is 50 %. The left ventricle has low normal function. The left ventricle has no regional wall motion abnormalities. The left ventricular internal cavity size was normal in size. There is no left ventricular hypertrophy. Left ventricular diastolic parameters are indeterminate. Right Ventricle: The right ventricular size is normal. No increase in right ventricular wall thickness. Right ventricular systolic function is low normal. Left Atrium: Left atrial size was normal in size. Right Atrium: Right atrial size was normal in size. Pericardium: There is no evidence of pericardial  effusion. Mitral Valve: The mitral valve is normal in structure. Trivial mitral valve regurgitation. No evidence of mitral valve stenosis. Tricuspid Valve: The tricuspid valve is normal in structure. Tricuspid valve regurgitation is trivial. No evidence of tricuspid stenosis. Aortic Valve: The aortic valve is tricuspid. Aortic valve regurgitation is not visualized. Aortic valve sclerosis is present, with no evidence of aortic valve stenosis. Pulmonic Valve: The pulmonic valve was normal in structure. Pulmonic valve regurgitation is not visualized. No evidence of pulmonic stenosis. Aorta: The aortic root and ascending aorta are structurally normal, with no evidence of dilitation. IAS/Shunts: No atrial level shunt detected by color flow Doppler.  LEFT VENTRICLE PLAX 2D LVIDd:         3.80 cm         Diastology LVIDs:         2.50 cm         LV e' medial:    6.08 cm/s LV PW:         1.00 cm         LV E/e' medial:  14.7 LV IVS:        1.00 cm         LV e' lateral:   9.16 cm/s LVOT diam:     1.90 cm         LV E/e' lateral: 9.7 LV SV:         46 LV SV Index:   32              2D LVOT Area:     2.84 cm        Longitudinal                                Strain                                2D Strain GLS  -16.1 %                                (  A2C):                                2D Strain GLS  -14.2 %                                (A3C):                                2D Strain GLS  -20.5 %                                (A4C):                                2D Strain GLS  -16.9 %                                Avg:                                 3D Volume EF                                LV 3D EF:    Left                                             ventricul                                             ar                                             ejection                                             fraction                                             by 3D                                             volume is                                              50 %.  3D Volume EF:                                3D EF:        50 %                                LV EDV:       123 ml                                LV ESV:       62 ml                                LV SV:        61 ml RIGHT VENTRICLE RV S prime:     8.43 cm/s TAPSE (M-mode): 1.8 cm LEFT ATRIUM             Index        RIGHT ATRIUM           Index LA diam:        3.10 cm 2.15 cm/m   RA Area:     12.80 cm LA Vol (A2C):   47.2 ml 32.78 ml/m  RA Volume:   29.90 ml  20.77 ml/m LA Vol (A4C):   32.8 ml 22.78 ml/m LA Biplane Vol: 43.3 ml 30.07 ml/m  AORTIC VALVE LVOT Vmax:   69.00 cm/s LVOT Vmean:  44.100 cm/s LVOT VTI:    0.161 m  AORTA Ao Root diam: 2.80 cm Ao Asc diam:  2.80 cm MITRAL VALVE MV Area (PHT): 3.66 cm    SHUNTS MV Decel Time: 207 msec    Systemic VTI:  0.16 m MV E velocity: 89.10 cm/s  Systemic Diam: 1.90 cm MV A velocity: 61.05 cm/s MV E/A ratio:  1.46 Dorris Carnes MD Electronically signed by Dorris Carnes MD Signature Date/Time: 10/18/2022/1:42:45 PM    Final    DG Chest Portable 1 View  Result Date: 10/17/2022 CLINICAL DATA:  Tachycardia, chest pain EXAM: PORTABLE CHEST 1 VIEW COMPARISON:  10/22/2018 FINDINGS: Single frontal view of the chest demonstrates a stable cardiac silhouette. No airspace disease, effusion, or pneumothorax. No acute bony abnormality. IMPRESSION: 1. No acute intrathoracic process. Electronically Signed   By: Randa Ngo M.D.   On: 10/17/2022 23:05    Microbiology: Results for orders placed or performed during the hospital encounter of 10/31/21  Resp Panel by RT-PCR (Flu A&B, Covid) Nasopharyngeal Swab     Status: None   Collection Time: 10/31/21  5:33 PM   Specimen: Nasopharyngeal Swab; Nasopharyngeal(NP) swabs in vial transport medium  Result Value Ref Range Status   SARS Coronavirus 2 by RT PCR NEGATIVE NEGATIVE Final    Comment: (NOTE) SARS-CoV-2 target nucleic acids are NOT  DETECTED.  The SARS-CoV-2 RNA is generally detectable in upper respiratory specimens during the acute phase of infection. The lowest concentration of SARS-CoV-2 viral copies this assay can detect is 138 copies/mL. A negative result does not preclude SARS-Cov-2 infection and should not be used as the sole basis for treatment or other patient management decisions. A negative result may occur with  improper specimen collection/handling, submission of specimen other than nasopharyngeal swab, presence of viral mutation(s) within the areas  targeted by this assay, and inadequate number of viral copies(<138 copies/mL). A negative result must be combined with clinical observations, patient history, and epidemiological information. The expected result is Negative.  Fact Sheet for Patients:  EntrepreneurPulse.com.au  Fact Sheet for Healthcare Providers:  IncredibleEmployment.be  This test is no t yet approved or cleared by the [place] FDA and  has been authorized for detection and/or diagnosis of SARS-CoV-2 by FDA under an Emergency Use Authorization (EUA). This EUA will remain  in effect (meaning this test can be used) for the duration of the COVID-19 declaration under Section 564(b)(1) of the Act, 21 U.S.C.section 360bbb-3(b)(1), unless the authorization is terminated  or revoked sooner.       Influenza A by PCR NEGATIVE NEGATIVE Final   Influenza B by PCR NEGATIVE NEGATIVE Final    Comment: (NOTE) The Xpert Xpress SARS-CoV-2/FLU/RSV plus assay is intended as an aid in the diagnosis of influenza from Nasopharyngeal swab specimens and should not be used as a sole basis for treatment. Nasal washings and aspirates are unacceptable for Xpert Xpress SARS-CoV-2/FLU/RSV testing.  Fact Sheet for Patients: EntrepreneurPulse.com.au  Fact Sheet for Healthcare Providers: IncredibleEmployment.be  This test is not yet  approved or cleared by the [place] FDA and has been authorized for detection and/or diagnosis of SARS-CoV-2 by FDA under an Emergency Use Authorization (EUA). This EUA will remain in effect (meaning this test can be used) for the duration of the COVID-19 declaration under Section 564(b)(1) of the Act, 21 U.S.C. section 360bbb-3(b)(1), unless the authorization is terminated or revoked.  Performed at [facility], [address] [place], [place] [address]     Labs: CBC: Recent Labs  Lab 10/17/22 2253 10/19/22 0544  WBC 8.6 6.4  HGB 11.8* 11.2*  HCT 35.4* 32.6*  MCV 85.9 84.7  PLT 309 283   Basic Metabolic Panel: Recent Labs  Lab 10/17/22 2253 10/18/22 0825 10/19/22 0544  NA 140 141 138  K 2.7* 4.1 3.4*  CL 102 109 108  CO2 '28 27 25  '$ GLUCOSE 114* 100* 99  BUN '12 9 9  '$ CREATININE 0.96 0.91 0.87  CALCIUM 9.8 8.9 9.5  MG 2.3 2.1 2.1  PHOS  --   --  3.3   Liver Function Tests: Recent Labs  Lab 10/19/22 0544  AST 18  ALT 10  ALKPHOS 45  BILITOT 0.5  PROT 6.7  ALBUMIN 3.5   CBG: Recent Labs  Lab 10/17/22 2306  GLUCAP 124*    Discharge time spent: greater than 30 minutes.  Signed: Orson Eva, MD [facility] 10/19/2022

## 2022-10-20 ENCOUNTER — Telehealth: Payer: Self-pay

## 2022-10-20 LAB — HEMOGLOBIN A1C
Hgb A1c MFr Bld: 5.8 % — ABNORMAL HIGH (ref 4.8–5.6)
Mean Plasma Glucose: 120 mg/dL

## 2022-10-20 NOTE — Telephone Encounter (Unsigned)
Transition Care Management Unsuccessful Follow-up Telephone Call  Date of discharge and from where:  [name] 10/19/2022  Attempts:  1st Attempt  Reason for unsuccessful TCM follow-up call:  Unable to leave messagevoice mail not set up Juanda Crumble, [place] Direct Dial [phone]

## 2022-10-21 NOTE — Telephone Encounter (Unsigned)
Transition Care Management Unsuccessful Follow-up Telephone Call  Date of discharge and from where:  [name] 10/19/2022  Attempts:  3rd Attempt  Reason for unsuccessful TCM follow-up call:  No answer/busy Juanda Crumble, LPN [place] [phone]     Transition Care Management Unsuccessful Follow-up Telephone Call  Date of discharge and from where:  [name] 10/19/2022  Attempts:  2nd Attempt  Reason for unsuccessful TCM follow-up call:  No answer/busy Juanda Crumble, [place] Direct Dial [phone]

## 2022-11-18 ENCOUNTER — Ambulatory Visit: Payer: Medicare Other | Admitting: Podiatry

## 2022-11-24 ENCOUNTER — Ambulatory Visit (INDEPENDENT_AMBULATORY_CARE_PROVIDER_SITE_OTHER): Payer: Medicare Other | Admitting: Podiatry

## 2022-11-24 VITALS — BP 136/95

## 2022-11-24 DIAGNOSIS — B351 Tinea unguium: Secondary | ICD-10-CM

## 2022-11-24 DIAGNOSIS — M79675 Pain in left toe(s): Secondary | ICD-10-CM

## 2022-11-24 DIAGNOSIS — M79674 Pain in right toe(s): Secondary | ICD-10-CM | POA: Diagnosis not present

## 2022-11-24 NOTE — Progress Notes (Signed)
   Chief Complaint  Patient presents with   Nail Problem    Routine foot care, nail trim, nail are thick and dark in color     SUBJECTIVE Patient presents to office today complaining of elongated, thickened nails that cause pain while ambulating in shoes.  Patient is unable to trim their own nails. Patient is here for further evaluation and treatment.  Past Medical History:  Diagnosis Date   Adjustment disorder with depressed mood 01/11/2009   Arthritis    Asthma    Breast cancer ([place])    left breast   CAD 07/10/2008   Chronic back pain    COCAINE DEPENDENCY NOS 01/14/2007   stopped Cocaine 15 years    CONVULSIONS, SEIZURES, NOS 01/14/2007   last seizure May, 2016   COPD (chronic obstructive pulmonary disease) ([place])    Coronary artery disease 01/14/2012   CVA 01/14/2007   Difficulty seeing    patient has glaucoma   Family history of anesthesia complication 3086   Pts sister (62 years old) died after having anesthesia - induced a heart attcks   Glaucoma of both eyes    Headache(784.0)    HEPATITIS C 02/26/2009   Hepatitis C    Hyperlipemia    HYPERTENSION, BENIGN SYSTEMIC 01/14/2007   Memory loss    d/t CVA   MI (myocardial infarction) ([place]) 12/20/2007   had stent placed   Pneumonia    hx of   PTSD (post-traumatic stress disorder)    Right sided weakness    d/t stroke   Sciatica    Stroke [facility])    right side weakness   TOBACCO DEPENDENCE 01/14/2007    Allergies  Allergen Reactions   Bee Venom Anaphylaxis   Other     Patient states she has been told not to take ibuprofen and tylenol     OBJECTIVE General Patient is awake, alert, and oriented x 3 and in no acute distress. Derm Skin is dry and supple bilateral. Negative open lesions or macerations. Remaining integument unremarkable. Nails are tender, long, thickened and dystrophic with subungual debris, consistent with onychomycosis, 1-5 bilateral. No signs of infection noted. Vasc  DP and PT pedal pulses  palpable bilaterally. Temperature gradient within normal limits.  Neuro Epicritic and protective threshold sensation grossly intact bilaterally.  Musculoskeletal Exam No symptomatic pedal deformities noted bilateral. Muscular strength within normal limits.  ASSESSMENT 1.  Pain due to onychomycosis of toenails both  PLAN OF CARE 1. Patient evaluated today.  2. Instructed to maintain good pedal hygiene and foot care.  3. Mechanical debridement of nails 1-5 bilaterally performed using a nail nipper. Filed with dremel without incident.  4. Return to clinic in 3 mos.    Edrick Kins, DPM [facility]  Dr. Edrick Kins, DPM    [address], [place] [address]                Office [phone]  Fax [phone]

## 2023-01-24 ENCOUNTER — Emergency Department (HOSPITAL_COMMUNITY)
Admission: EM | Admit: 2023-01-24 | Discharge: 2023-01-25 | Disposition: A | Discharge status: Home / Self Care | Payer: Medicare Other | Source: Home / Self Care | Attending: Emergency Medicine | Admitting: Emergency Medicine

## 2023-01-24 ENCOUNTER — Encounter (HOSPITAL_COMMUNITY): Payer: Self-pay

## 2023-01-24 ENCOUNTER — Other Ambulatory Visit: Payer: Self-pay

## 2023-01-24 DIAGNOSIS — S72141A Displaced intertrochanteric fracture of right femur, initial encounter for closed fracture: Secondary | ICD-10-CM | POA: Diagnosis not present

## 2023-01-24 DIAGNOSIS — I2510 Atherosclerotic heart disease of native coronary artery without angina pectoris: Secondary | ICD-10-CM | POA: Insufficient documentation

## 2023-01-24 DIAGNOSIS — I69351 Hemiplegia and hemiparesis following cerebral infarction affecting right dominant side: Secondary | ICD-10-CM | POA: Diagnosis not present

## 2023-01-24 DIAGNOSIS — R079 Chest pain, unspecified: Secondary | ICD-10-CM | POA: Insufficient documentation

## 2023-01-24 DIAGNOSIS — J449 Chronic obstructive pulmonary disease, unspecified: Secondary | ICD-10-CM | POA: Insufficient documentation

## 2023-01-24 DIAGNOSIS — F1410 Cocaine abuse, uncomplicated: Secondary | ICD-10-CM | POA: Diagnosis not present

## 2023-01-24 DIAGNOSIS — J45909 Unspecified asthma, uncomplicated: Secondary | ICD-10-CM | POA: Insufficient documentation

## 2023-01-24 DIAGNOSIS — Z681 Body mass index (BMI) 19 or less, adult: Secondary | ICD-10-CM | POA: Diagnosis not present

## 2023-01-24 DIAGNOSIS — Z79899 Other long term (current) drug therapy: Secondary | ICD-10-CM | POA: Diagnosis not present

## 2023-01-24 DIAGNOSIS — D62 Acute posthemorrhagic anemia: Secondary | ICD-10-CM | POA: Diagnosis not present

## 2023-01-24 DIAGNOSIS — Z7982 Long term (current) use of aspirin: Secondary | ICD-10-CM | POA: Diagnosis not present

## 2023-01-24 DIAGNOSIS — F17210 Nicotine dependence, cigarettes, uncomplicated: Secondary | ICD-10-CM | POA: Insufficient documentation

## 2023-01-24 DIAGNOSIS — Z7902 Long term (current) use of antithrombotics/antiplatelets: Secondary | ICD-10-CM | POA: Diagnosis not present

## 2023-01-24 DIAGNOSIS — E559 Vitamin D deficiency, unspecified: Secondary | ICD-10-CM | POA: Diagnosis not present

## 2023-01-24 DIAGNOSIS — E871 Hypo-osmolality and hyponatremia: Secondary | ICD-10-CM | POA: Diagnosis present

## 2023-01-24 DIAGNOSIS — E43 Unspecified severe protein-calorie malnutrition: Secondary | ICD-10-CM | POA: Diagnosis not present

## 2023-01-24 DIAGNOSIS — Y92009 Unspecified place in unspecified non-institutional (private) residence as the place of occurrence of the external cause: Secondary | ICD-10-CM | POA: Diagnosis not present

## 2023-01-24 DIAGNOSIS — I10 Essential (primary) hypertension: Secondary | ICD-10-CM | POA: Insufficient documentation

## 2023-01-24 DIAGNOSIS — Z5941 Food insecurity: Secondary | ICD-10-CM | POA: Diagnosis not present

## 2023-01-24 DIAGNOSIS — Z853 Personal history of malignant neoplasm of breast: Secondary | ICD-10-CM | POA: Insufficient documentation

## 2023-01-24 DIAGNOSIS — I252 Old myocardial infarction: Secondary | ICD-10-CM | POA: Diagnosis not present

## 2023-01-24 DIAGNOSIS — J4489 Other specified chronic obstructive pulmonary disease: Secondary | ICD-10-CM | POA: Diagnosis not present

## 2023-01-24 DIAGNOSIS — E782 Mixed hyperlipidemia: Secondary | ICD-10-CM | POA: Diagnosis not present

## 2023-01-24 DIAGNOSIS — F19139 Other psychoactive substance abuse with withdrawal, unspecified: Secondary | ICD-10-CM | POA: Diagnosis not present

## 2023-01-24 DIAGNOSIS — I959 Hypotension, unspecified: Secondary | ICD-10-CM | POA: Diagnosis not present

## 2023-01-24 DIAGNOSIS — W1830XA Fall on same level, unspecified, initial encounter: Secondary | ICD-10-CM | POA: Diagnosis not present

## 2023-01-24 DIAGNOSIS — Z5982 Transportation insecurity: Secondary | ICD-10-CM | POA: Diagnosis not present

## 2023-01-24 DIAGNOSIS — F1010 Alcohol abuse, uncomplicated: Secondary | ICD-10-CM | POA: Diagnosis not present

## 2023-01-24 DIAGNOSIS — F4310 Post-traumatic stress disorder, unspecified: Secondary | ICD-10-CM | POA: Diagnosis not present

## 2023-01-24 DIAGNOSIS — Z955 Presence of coronary angioplasty implant and graft: Secondary | ICD-10-CM | POA: Diagnosis not present

## 2023-01-24 NOTE — ED Triage Notes (Signed)
Medics were called out initially for chest pain. Patient denies any chest pain. Family then says she is speaking slower but no last time of when she wasn't. Family was screaming in the house and patient says it increased her stress and her BP was elevated and she has not been taking her BP meds as scheduled

## 2023-01-25 ENCOUNTER — Emergency Department (HOSPITAL_COMMUNITY): Payer: Medicare Other

## 2023-01-25 DIAGNOSIS — S72141A Displaced intertrochanteric fracture of right femur, initial encounter for closed fracture: Secondary | ICD-10-CM | POA: Diagnosis not present

## 2023-01-25 LAB — CBC
HCT: 35.3 % — ABNORMAL LOW (ref 36.0–46.0)
Hemoglobin: 12.2 g/dL (ref 12.0–15.0)
MCH: 29.6 pg (ref 26.0–34.0)
MCHC: 34.6 g/dL (ref 30.0–36.0)
MCV: 85.7 fL (ref 80.0–100.0)
Platelets: 325 10*3/uL (ref 150–400)
RBC: 4.12 MIL/uL (ref 3.87–5.11)
RDW: 14.7 % (ref 11.5–15.5)
WBC: 9.6 10*3/uL (ref 4.0–10.5)
nRBC: 0 % (ref 0.0–0.2)

## 2023-01-25 LAB — COMPREHENSIVE METABOLIC PANEL
ALT: 15 U/L (ref 0–44)
AST: 24 U/L (ref 15–41)
Albumin: 4.2 g/dL (ref 3.5–5.0)
Alkaline Phosphatase: 52 U/L (ref 38–126)
Anion gap: 7 (ref 5–15)
BUN: 10 mg/dL (ref 8–23)
CO2: 28 mmol/L (ref 22–32)
Calcium: 9.6 mg/dL (ref 8.9–10.3)
Chloride: 98 mmol/L (ref 98–111)
Creatinine, Ser: 0.9 mg/dL (ref 0.44–1.00)
GFR, Estimated: >60 mL/min (ref >60)
Glucose, Bld: 129 mg/dL — ABNORMAL HIGH (ref 70–99)
Potassium: 4 mmol/L (ref 3.5–5.1)
Sodium: 133 mmol/L — ABNORMAL LOW (ref 135–145)
Total Bilirubin: 0.5 mg/dL (ref 0.3–1.2)
Total Protein: 8 g/dL (ref 6.5–8.1)

## 2023-01-25 LAB — TROPONIN I (HIGH SENSITIVITY)
Troponin I (High Sensitivity): 11 ng/L (ref <18)
Troponin I (High Sensitivity): 9 ng/L (ref <18)

## 2023-01-25 MED ORDER — HYDRALAZINE HCL 25 MG PO TABS
25.0000 mg | ORAL_TABLET | Freq: Once | ORAL | Status: AC
  Administered 2023-01-25: 25 mg via ORAL
  Filled 2023-01-25: qty 1

## 2023-01-25 NOTE — Discharge Instructions (Signed)
You were evaluated in the Emergency Department and after careful evaluation, we did not find any emergent condition requiring admission or further testing in the hospital.  Your exam/testing today is overall reassuring.  Please return to the Emergency Department if you experience any worsening of your condition.   Thank you for allowing us to be a part of your care. 

## 2023-01-25 NOTE — ED Provider Notes (Signed)
[facility] Emergency Department Provider Note MRN:  [id number]  Arrival date & time: 01/25/23     Chief Complaint   Hypertension   History of Present Illness   [name] is a 67 y.o. year-old female with a history of CAD, cocaine use presenting to the ED with chief complaint of hypertension.  Patient explains that she does not want to be here, she thinks she was just anxious, she wants to go home.  Further history obtained from patient's cousin, reportedly she was found clutching her chest and she was talking a bit abnormally.  Family says that she was smoking crack prior to this episode.  Review of Systems  A thorough review of systems was obtained and all systems are negative except as noted in the HPI and PMH.   Patient's Health History    Past Medical History:  Diagnosis Date   Adjustment disorder with depressed mood 01/11/2009   Arthritis    Asthma    Breast cancer ([place])    left breast   CAD 07/10/2008   Chronic back pain    COCAINE DEPENDENCY NOS 01/14/2007   stopped Cocaine 15 years    CONVULSIONS, SEIZURES, NOS 01/14/2007   last seizure May, 2016   COPD (chronic obstructive pulmonary disease) ([place])    Coronary artery disease 01/14/2012   CVA 01/14/2007   Difficulty seeing    patient has glaucoma   Family history of anesthesia complication Q000111Q   Pts sister (78 years old) died after having anesthesia - induced a heart attcks   Glaucoma of both eyes    Headache(784.0)    HEPATITIS C 02/26/2009   Hepatitis C    Hyperlipemia    HYPERTENSION, BENIGN SYSTEMIC 01/14/2007   Memory loss    d/t CVA   MI (myocardial infarction) ([name]) 12/20/2007   had stent placed   Pneumonia    hx of   PTSD (post-traumatic stress disorder)    Right sided weakness    d/t stroke   Sciatica    Stroke [facility])    right side weakness   TOBACCO DEPENDENCE 01/14/2007    Past Surgical History:  Procedure Laterality Date   BREAST LUMPECTOMY Left 09/18/2017    Procedure: EVACUATION HEMATOMA  LEFT BREAST;  Surgeon: [name], MD;  Location: [place];  Service: General;  Laterality: Left;   CATARACT EXTRACTION W/PHACO Right 07/11/2015   Procedure: CATARACT EXTRACTION PHACO AND INTRAOCULAR LENS PLACEMENT ([place]) RIGHT,INTERNAL PLEB REVISION;  Surgeon: Marylynn Pearson, MD;  Location: [place];  Service: Ophthalmology;  Laterality: Right;   CESAREAN SECTION     x 2   COLONOSCOPY W/ POLYPECTOMY     CORONARY ANGIOPLASTY WITH STENT PLACEMENT  2009   1 stent   EYE SURGERY     MASTECTOMY W/ SENTINEL NODE BIOPSY Left 09/08/2017   Procedure: LEFT MASTECTOMY WITH SENTINEL LYMPH NODE BIOPSY;  Surgeon: [name], MD;  Location: [place];  Service: General;  Laterality: Left;   MITOMYCIN C APPLICATION Right [id number]   Procedure: MITOMYCIN C APPLICATION RIGHT EYE;  Surgeon: Marylynn Pearson, MD;  Location: [place];  Service: Ophthalmology;  Laterality: Right;   TONSILLECTOMY     TRABECULECTOMY Left 06/29/2013   Procedure: TRABECULECTOMY WITH MYTOMICIN C LEFT EYE;  Surgeon: Marylynn Pearson, MD;  Location: [place];  Service: Ophthalmology;  Laterality: Left;   TRABECULECTOMY Right 09/07/2013   Procedure: TRABECULECTOMY RIGHT EYE;  Surgeon: Marylynn Pearson, MD;  Location: [place];  Service: Ophthalmology;  Laterality: Right;  Family History  Problem Relation Age of Onset   Stroke Mother    Colon cancer Paternal Uncle    Heart disease Sister    Colon cancer Paternal Aunt     Social History   Socioeconomic History   Marital status: Single    Spouse name: Not on file   Number of children: 2   Years of education: Not on file   Highest education level: Not on file  Occupational History   Not on file  Tobacco Use   Smoking status: Some Days    Packs/day: 0.25    Years: 47.00    Total pack years: 11.75    Types: Cigarettes   Smokeless tobacco: Never  Vaping Use   Vaping Use: Never used  Substance and Sexual Activity   Alcohol use: No    Comment: quit 4 years  ago   Drug use: No    Comment: last use of cocaine 20 years ago   Sexual activity: Never    Birth control/protection: Abstinence, Post-menopausal  Other Topics Concern   Not on file  Social History Narrative   On disability- due to CVA/ PTSD and MI-- weak on right side from stroke.       Has 2 children.  Living with sister here in [place].       Updated 02/01/20   Patient reports she lives alone.  Has an aide that comes daily to assist with ADL's. Is on the wait list for meals on wheels   Had poor eye sight difficult seeing    Social Determinants of Health   Financial Resource Strain: Not on file  Food Insecurity: Food Insecurity Present (10/18/2022)   Hunger Vital Sign    Worried About Running Out of Food in the Last Year: Sometimes true    Ran Out of Food in the Last Year: Sometimes true  Transportation Needs: Unmet Transportation Needs (10/18/2022)   PRAPARE - Hydrologist (Medical): Yes    Lack of Transportation (Non-Medical): Yes  Physical Activity: Not on file  Stress: Not on file  Social Connections: Not on file  Intimate Partner Violence: Not At Risk (10/18/2022)   Humiliation, Afraid, Rape, and Kick questionnaire    Fear of Current or Ex-Partner: No    Emotionally Abused: No    Physically Abused: No    Sexually Abused: No     Physical Exam   Vitals:   01/25/23 0325 01/25/23 0400  BP:  (!) 186/89  Pulse:  69  Resp:  14  Temp: 98.3 F (36.8 C)   SpO2:  100%    CONSTITUTIONAL: Chronically ill-appearing, NAD NEURO/PSYCH:  Alert and oriented x 3, normal and symmetric strength and sensation, normal coordination, normal speech EYES:  eyes equal and reactive ENT/NECK:  no LAD, no JVD CARDIO: Regular rate, well-perfused, normal S1 and S2 PULM:  CTAB no wheezing or rhonchi GI/GU:  non-distended, non-tender MSK/SPINE:  No gross deformities, no edema SKIN:  no rash, atraumatic   *Additional and/or pertinent findings included in MDM  below  Diagnostic and Interventional Summary    EKG Interpretation  Date/Time:  Saturday January 24 2023 22:19:02 EST Ventricular Rate:  66 PR Interval:  173 QRS Duration: 95 QT Interval:  410 QTC Calculation: 430 R Axis:   78 Text Interpretation: Sinus rhythm Nonspecific T abnrm, anterolateral leads Minimal ST elevation, inferior leads Confirmed by Gerlene Fee [phone]) on 01/24/2023 11:46:29 PM       Labs Reviewed  CBC -  Abnormal; Notable for the following components:      Result Value   HCT 35.3 (*)    All other components within normal limits  COMPREHENSIVE METABOLIC PANEL - Abnormal; Notable for the following components:   Sodium 133 (*)    Glucose, Bld 129 (*)    All other components within normal limits  TROPONIN I (HIGH SENSITIVITY)  TROPONIN I (HIGH SENSITIVITY)    CT HEAD WO CONTRAST (5MM)  Final Result    DG Chest Port 1 View  Final Result      Medications  hydrALAZINE (APRESOLINE) tablet 25 mg (25 mg Oral Given 01/25/23 0138)     Procedures  /  Critical Care Procedures  ED Course and Medical Decision Making  Initial Impression and Ddx Patient presenting with chest pain and reportedly some speech disturbance shortly after using crack cocaine.  On exam here she has no complaints, wants to go home.  No focal neurological deficits, no chest pain at this time.  Will evaluate for signs of cardiac ischemia with EKG, troponins.  Screening CT head though I suspect her speech disturbance was related to substance use at the time.  Past medical/surgical history that increases complexity of ED encounter: CAD  Interpretation of Diagnostics I personally reviewed the EKG and my interpretation is as follows: Sinus rhythm without significant changes from prior  Labs reassuring with no significant blood count or electrolyte disturbance, troponin negative x 2.  Chest x-ray without emergent process, question nipple shadow versus nodule.  Patient Reassessment and Ultimate  Disposition/Management     Patient continues to rest comfortably, blood pressure a bit elevated but responds well to antihypertensive.  No signs of endorgan damage or emergent process, appropriate for discharge.  Patient management required discussion with the following services or consulting groups:  None  Complexity of Problems Addressed Acute illness or injury that poses threat of life of bodily function  Additional Data Reviewed and Analyzed Further history obtained from: Further history from spouse/family member  Additional Factors Impacting ED Encounter Risk None  Barth Kirks. Sedonia Small, [place] [email or web address]  Final Clinical Impressions(s) / ED Diagnoses     ICD-10-CM   1. Chest pain, unspecified type  R07.9       ED Discharge Orders     None        Discharge Instructions Discussed with and Provided to Patient:     Discharge Instructions      You were evaluated in the Emergency Department and after careful evaluation, we did not find any emergent condition requiring admission or further testing in the hospital.  Your exam/testing today is overall reassuring.   Please return to the Emergency Department if you experience any worsening of your condition.   Thank you for allowing [place] to be a part of your care.       Maudie Flakes, MD 01/25/23 [phone]

## 2023-01-26 ENCOUNTER — Inpatient Hospital Stay (HOSPITAL_COMMUNITY)
Admission: EM | Admit: 2023-01-26 | Discharge: 2023-02-03 | DRG: 480 | Disposition: A | Discharge status: Home Health | Payer: Medicare Other | Attending: Internal Medicine | Admitting: Internal Medicine

## 2023-01-26 ENCOUNTER — Other Ambulatory Visit: Payer: Self-pay

## 2023-01-26 ENCOUNTER — Encounter (HOSPITAL_COMMUNITY): Payer: Self-pay | Admitting: Emergency Medicine

## 2023-01-26 ENCOUNTER — Emergency Department (HOSPITAL_COMMUNITY): Payer: Medicare Other

## 2023-01-26 DIAGNOSIS — T148XXA Other injury of unspecified body region, initial encounter: Secondary | ICD-10-CM

## 2023-01-26 DIAGNOSIS — E871 Hypo-osmolality and hyponatremia: Secondary | ICD-10-CM

## 2023-01-26 DIAGNOSIS — R7309 Other abnormal glucose: Secondary | ICD-10-CM

## 2023-01-26 DIAGNOSIS — Z91030 Bee allergy status: Secondary | ICD-10-CM

## 2023-01-26 DIAGNOSIS — Z7902 Long term (current) use of antithrombotics/antiplatelets: Secondary | ICD-10-CM

## 2023-01-26 DIAGNOSIS — F1010 Alcohol abuse, uncomplicated: Secondary | ICD-10-CM | POA: Diagnosis present

## 2023-01-26 DIAGNOSIS — Z800 Family history of malignant neoplasm of digestive organs: Secondary | ICD-10-CM

## 2023-01-26 DIAGNOSIS — M62838 Other muscle spasm: Secondary | ICD-10-CM | POA: Diagnosis not present

## 2023-01-26 DIAGNOSIS — Z681 Body mass index (BMI) 19 or less, adult: Secondary | ICD-10-CM

## 2023-01-26 DIAGNOSIS — I69311 Memory deficit following cerebral infarction: Secondary | ICD-10-CM

## 2023-01-26 DIAGNOSIS — D62 Acute posthemorrhagic anemia: Secondary | ICD-10-CM | POA: Diagnosis present

## 2023-01-26 DIAGNOSIS — F17210 Nicotine dependence, cigarettes, uncomplicated: Secondary | ICD-10-CM | POA: Diagnosis present

## 2023-01-26 DIAGNOSIS — S72001A Fracture of unspecified part of neck of right femur, initial encounter for closed fracture: Principal | ICD-10-CM

## 2023-01-26 DIAGNOSIS — S72141A Displaced intertrochanteric fracture of right femur, initial encounter for closed fracture: Secondary | ICD-10-CM | POA: Diagnosis not present

## 2023-01-26 DIAGNOSIS — J4489 Other specified chronic obstructive pulmonary disease: Secondary | ICD-10-CM | POA: Diagnosis present

## 2023-01-26 DIAGNOSIS — F17200 Nicotine dependence, unspecified, uncomplicated: Secondary | ICD-10-CM | POA: Diagnosis present

## 2023-01-26 DIAGNOSIS — Z5982 Transportation insecurity: Secondary | ICD-10-CM

## 2023-01-26 DIAGNOSIS — E43 Unspecified severe protein-calorie malnutrition: Secondary | ICD-10-CM | POA: Diagnosis present

## 2023-01-26 DIAGNOSIS — F19139 Other psychoactive substance abuse with withdrawal, unspecified: Secondary | ICD-10-CM | POA: Diagnosis present

## 2023-01-26 DIAGNOSIS — S7291XA Unspecified fracture of right femur, initial encounter for closed fracture: Secondary | ICD-10-CM | POA: Diagnosis present

## 2023-01-26 DIAGNOSIS — I10 Essential (primary) hypertension: Secondary | ICD-10-CM | POA: Diagnosis present

## 2023-01-26 DIAGNOSIS — Z5941 Food insecurity: Secondary | ICD-10-CM

## 2023-01-26 DIAGNOSIS — Z8701 Personal history of pneumonia (recurrent): Secondary | ICD-10-CM

## 2023-01-26 DIAGNOSIS — I69351 Hemiplegia and hemiparesis following cerebral infarction affecting right dominant side: Secondary | ICD-10-CM

## 2023-01-26 DIAGNOSIS — Z7982 Long term (current) use of aspirin: Secondary | ICD-10-CM

## 2023-01-26 DIAGNOSIS — Z853 Personal history of malignant neoplasm of breast: Secondary | ICD-10-CM

## 2023-01-26 DIAGNOSIS — Z955 Presence of coronary angioplasty implant and graft: Secondary | ICD-10-CM

## 2023-01-26 DIAGNOSIS — Y92009 Unspecified place in unspecified non-institutional (private) residence as the place of occurrence of the external cause: Secondary | ICD-10-CM

## 2023-01-26 DIAGNOSIS — Z602 Problems related to living alone: Secondary | ICD-10-CM | POA: Diagnosis present

## 2023-01-26 DIAGNOSIS — J449 Chronic obstructive pulmonary disease, unspecified: Secondary | ICD-10-CM | POA: Diagnosis present

## 2023-01-26 DIAGNOSIS — E559 Vitamin D deficiency, unspecified: Secondary | ICD-10-CM | POA: Diagnosis present

## 2023-01-26 DIAGNOSIS — Z8249 Family history of ischemic heart disease and other diseases of the circulatory system: Secondary | ICD-10-CM

## 2023-01-26 DIAGNOSIS — Z9012 Acquired absence of left breast and nipple: Secondary | ICD-10-CM

## 2023-01-26 DIAGNOSIS — E782 Mixed hyperlipidemia: Secondary | ICD-10-CM | POA: Diagnosis present

## 2023-01-26 DIAGNOSIS — I2510 Atherosclerotic heart disease of native coronary artery without angina pectoris: Secondary | ICD-10-CM | POA: Diagnosis present

## 2023-01-26 DIAGNOSIS — D649 Anemia, unspecified: Secondary | ICD-10-CM

## 2023-01-26 DIAGNOSIS — F1410 Cocaine abuse, uncomplicated: Secondary | ICD-10-CM | POA: Diagnosis present

## 2023-01-26 DIAGNOSIS — R636 Underweight: Secondary | ICD-10-CM | POA: Diagnosis present

## 2023-01-26 DIAGNOSIS — I252 Old myocardial infarction: Secondary | ICD-10-CM

## 2023-01-26 DIAGNOSIS — I959 Hypotension, unspecified: Secondary | ICD-10-CM | POA: Diagnosis not present

## 2023-01-26 DIAGNOSIS — Z823 Family history of stroke: Secondary | ICD-10-CM

## 2023-01-26 DIAGNOSIS — Z86010 Personal history of colonic polyps: Secondary | ICD-10-CM

## 2023-01-26 DIAGNOSIS — W1830XA Fall on same level, unspecified, initial encounter: Secondary | ICD-10-CM | POA: Diagnosis present

## 2023-01-26 DIAGNOSIS — F4310 Post-traumatic stress disorder, unspecified: Secondary | ICD-10-CM | POA: Diagnosis present

## 2023-01-26 DIAGNOSIS — Z87892 Personal history of anaphylaxis: Secondary | ICD-10-CM

## 2023-01-26 DIAGNOSIS — Z79899 Other long term (current) drug therapy: Secondary | ICD-10-CM

## 2023-01-26 NOTE — ED Triage Notes (Signed)
Pt brought in by EMS after she had fallen at home and had c/o Rt hip pain. EMS states pt could not straighten out Rt leg.

## 2023-01-26 NOTE — ED Notes (Signed)
Patient transported to X-ray 

## 2023-01-27 ENCOUNTER — Inpatient Hospital Stay (HOSPITAL_COMMUNITY): Payer: Medicare Other

## 2023-01-27 ENCOUNTER — Emergency Department (HOSPITAL_COMMUNITY): Payer: Medicare Other

## 2023-01-27 DIAGNOSIS — S72001A Fracture of unspecified part of neck of right femur, initial encounter for closed fracture: Secondary | ICD-10-CM

## 2023-01-27 DIAGNOSIS — S72141A Displaced intertrochanteric fracture of right femur, initial encounter for closed fracture: Secondary | ICD-10-CM

## 2023-01-27 DIAGNOSIS — J449 Chronic obstructive pulmonary disease, unspecified: Secondary | ICD-10-CM

## 2023-01-27 DIAGNOSIS — Z853 Personal history of malignant neoplasm of breast: Secondary | ICD-10-CM | POA: Diagnosis not present

## 2023-01-27 DIAGNOSIS — W1830XA Fall on same level, unspecified, initial encounter: Secondary | ICD-10-CM | POA: Diagnosis present

## 2023-01-27 DIAGNOSIS — Z7982 Long term (current) use of aspirin: Secondary | ICD-10-CM | POA: Diagnosis not present

## 2023-01-27 DIAGNOSIS — J4489 Other specified chronic obstructive pulmonary disease: Secondary | ICD-10-CM | POA: Diagnosis present

## 2023-01-27 DIAGNOSIS — I2510 Atherosclerotic heart disease of native coronary artery without angina pectoris: Secondary | ICD-10-CM | POA: Diagnosis present

## 2023-01-27 DIAGNOSIS — D62 Acute posthemorrhagic anemia: Secondary | ICD-10-CM | POA: Diagnosis present

## 2023-01-27 DIAGNOSIS — I10 Essential (primary) hypertension: Secondary | ICD-10-CM

## 2023-01-27 DIAGNOSIS — F4310 Post-traumatic stress disorder, unspecified: Secondary | ICD-10-CM | POA: Diagnosis present

## 2023-01-27 DIAGNOSIS — Z720 Tobacco use: Secondary | ICD-10-CM

## 2023-01-27 DIAGNOSIS — Y92009 Unspecified place in unspecified non-institutional (private) residence as the place of occurrence of the external cause: Secondary | ICD-10-CM | POA: Diagnosis not present

## 2023-01-27 DIAGNOSIS — F1410 Cocaine abuse, uncomplicated: Secondary | ICD-10-CM | POA: Diagnosis present

## 2023-01-27 DIAGNOSIS — E559 Vitamin D deficiency, unspecified: Secondary | ICD-10-CM | POA: Diagnosis present

## 2023-01-27 DIAGNOSIS — E782 Mixed hyperlipidemia: Secondary | ICD-10-CM | POA: Diagnosis present

## 2023-01-27 DIAGNOSIS — F1010 Alcohol abuse, uncomplicated: Secondary | ICD-10-CM | POA: Diagnosis present

## 2023-01-27 DIAGNOSIS — Z7902 Long term (current) use of antithrombotics/antiplatelets: Secondary | ICD-10-CM | POA: Diagnosis not present

## 2023-01-27 DIAGNOSIS — Z5982 Transportation insecurity: Secondary | ICD-10-CM | POA: Diagnosis not present

## 2023-01-27 DIAGNOSIS — Z5941 Food insecurity: Secondary | ICD-10-CM | POA: Diagnosis not present

## 2023-01-27 DIAGNOSIS — Z681 Body mass index (BMI) 19 or less, adult: Secondary | ICD-10-CM | POA: Diagnosis not present

## 2023-01-27 DIAGNOSIS — Z79899 Other long term (current) drug therapy: Secondary | ICD-10-CM | POA: Diagnosis not present

## 2023-01-27 DIAGNOSIS — F19139 Other psychoactive substance abuse with withdrawal, unspecified: Secondary | ICD-10-CM | POA: Diagnosis present

## 2023-01-27 DIAGNOSIS — F17210 Nicotine dependence, cigarettes, uncomplicated: Secondary | ICD-10-CM | POA: Diagnosis present

## 2023-01-27 DIAGNOSIS — I69351 Hemiplegia and hemiparesis following cerebral infarction affecting right dominant side: Secondary | ICD-10-CM | POA: Diagnosis not present

## 2023-01-27 DIAGNOSIS — Z955 Presence of coronary angioplasty implant and graft: Secondary | ICD-10-CM | POA: Diagnosis not present

## 2023-01-27 DIAGNOSIS — E43 Unspecified severe protein-calorie malnutrition: Secondary | ICD-10-CM | POA: Diagnosis present

## 2023-01-27 DIAGNOSIS — S7291XA Unspecified fracture of right femur, initial encounter for closed fracture: Secondary | ICD-10-CM | POA: Diagnosis present

## 2023-01-27 DIAGNOSIS — I959 Hypotension, unspecified: Secondary | ICD-10-CM | POA: Diagnosis not present

## 2023-01-27 DIAGNOSIS — E871 Hypo-osmolality and hyponatremia: Secondary | ICD-10-CM | POA: Diagnosis present

## 2023-01-27 DIAGNOSIS — I252 Old myocardial infarction: Secondary | ICD-10-CM | POA: Diagnosis not present

## 2023-01-27 DIAGNOSIS — R636 Underweight: Secondary | ICD-10-CM

## 2023-01-27 LAB — CBC WITH DIFFERENTIAL/PLATELET
Abs Immature Granulocytes: 0.06 10*3/uL (ref 0.00–0.07)
Basophils Absolute: 0 10*3/uL (ref 0.0–0.1)
Basophils Relative: 0 %
Eosinophils Absolute: 0 10*3/uL (ref 0.0–0.5)
Eosinophils Relative: 0 %
HCT: 32.6 % — ABNORMAL LOW (ref 36.0–46.0)
Hemoglobin: 11.1 g/dL — ABNORMAL LOW (ref 12.0–15.0)
Immature Granulocytes: 0 %
Lymphocytes Relative: 9 %
Lymphs Abs: 1.4 10*3/uL (ref 0.7–4.0)
MCH: 29.3 pg (ref 26.0–34.0)
MCHC: 34 g/dL (ref 30.0–36.0)
MCV: 86 fL (ref 80.0–100.0)
Monocytes Absolute: 0.6 10*3/uL (ref 0.1–1.0)
Monocytes Relative: 4 %
Neutro Abs: 12.9 10*3/uL — ABNORMAL HIGH (ref 1.7–7.7)
Neutrophils Relative %: 87 %
Platelets: 300 10*3/uL (ref 150–400)
RBC: 3.79 MIL/uL — ABNORMAL LOW (ref 3.87–5.11)
RDW: 14.6 % (ref 11.5–15.5)
WBC: 15 10*3/uL — ABNORMAL HIGH (ref 4.0–10.5)
nRBC: 0 % (ref 0.0–0.2)

## 2023-01-27 LAB — BASIC METABOLIC PANEL
Anion gap: 5 (ref 5–15)
BUN: 13 mg/dL (ref 8–23)
CO2: 27 mmol/L (ref 22–32)
Calcium: 9.5 mg/dL (ref 8.9–10.3)
Chloride: 100 mmol/L (ref 98–111)
Creatinine, Ser: 0.86 mg/dL (ref 0.44–1.00)
GFR, Estimated: >60 mL/min (ref >60)
Glucose, Bld: 123 mg/dL — ABNORMAL HIGH (ref 70–99)
Potassium: 3.5 mmol/L (ref 3.5–5.1)
Sodium: 132 mmol/L — ABNORMAL LOW (ref 135–145)

## 2023-01-27 LAB — PHOSPHORUS: Phosphorus: 3.6 mg/dL (ref 2.5–4.6)

## 2023-01-27 LAB — PROTIME-INR
INR: 1.1 (ref 0.8–1.2)
Prothrombin Time: 14.5 seconds (ref 11.4–15.2)

## 2023-01-27 LAB — MAGNESIUM: Magnesium: 2.1 mg/dL (ref 1.7–2.4)

## 2023-01-27 LAB — MRSA NEXT GEN BY PCR, NASAL: MRSA by PCR Next Gen: NOT DETECTED

## 2023-01-27 MED ORDER — CLOPIDOGREL BISULFATE 75 MG PO TABS
75.0000 mg | ORAL_TABLET | Freq: Every day | ORAL | Status: DC
  Administered 2023-01-27: 75 mg via ORAL
  Filled 2023-01-27: qty 1

## 2023-01-27 MED ORDER — HEPARIN SODIUM (PORCINE) 5000 UNIT/ML IJ SOLN: 5000.0000 [IU] | Freq: Three times a day (TID) | INTRAMUSCULAR | Status: DC

## 2023-01-27 MED ORDER — ATORVASTATIN CALCIUM 40 MG PO TABS
40.0000 mg | ORAL_TABLET | Freq: Every day | ORAL | Status: DC
  Administered 2023-01-27 – 2023-02-03 (×7): 40 mg via ORAL
  Filled 2023-01-27 (×8): qty 1

## 2023-01-27 MED ORDER — TRANEXAMIC ACID-NACL 1000-0.7 MG/100ML-% IV SOLN
1000.0000 mg | INTRAVENOUS | Status: AC
  Administered 2023-01-29: 1000 mg via INTRAVENOUS
  Filled 2023-01-27: qty 100

## 2023-01-27 MED ORDER — CEFAZOLIN SODIUM-DEXTROSE 2-4 GM/100ML-% IV SOLN
2.0000 g | INTRAVENOUS | Status: AC
  Administered 2023-01-29: 2 g via INTRAVENOUS
  Filled 2023-01-27: qty 100

## 2023-01-27 MED ORDER — HYDROMORPHONE HCL 1 MG/ML IJ SOLN
0.5000 mg | INTRAMUSCULAR | Status: DC | PRN
  Administered 2023-01-27: 0.5 mg via INTRAVENOUS

## 2023-01-27 MED ORDER — HYDROMORPHONE HCL 1 MG/ML IJ SOLN: 1.0000 mg | INTRAMUSCULAR | Status: DC | PRN

## 2023-01-27 MED ORDER — ACETAMINOPHEN 325 MG PO TABS: 650.0000 mg | ORAL_TABLET | Freq: Four times a day (QID) | ORAL | Status: DC | PRN

## 2023-01-27 MED ORDER — ACETAMINOPHEN 650 MG RE SUPP: 650.0000 mg | Freq: Four times a day (QID) | RECTAL | Status: DC | PRN

## 2023-01-27 MED ORDER — ACETAMINOPHEN 500 MG PO TABS
1000.0000 mg | ORAL_TABLET | Freq: Three times a day (TID) | ORAL | Status: DC
  Administered 2023-01-27 – 2023-01-28 (×2): 1000 mg via ORAL
  Filled 2023-01-27 (×3): qty 2

## 2023-01-27 MED ORDER — CARVEDILOL 12.5 MG PO TABS
12.5000 mg | ORAL_TABLET | Freq: Two times a day (BID) | ORAL | Status: DC
  Administered 2023-01-27 (×2): 12.5 mg via ORAL
  Filled 2023-01-27 (×3): qty 1

## 2023-01-27 MED ORDER — AMLODIPINE BESYLATE 5 MG PO TABS
10.0000 mg | ORAL_TABLET | Freq: Every day | ORAL | Status: DC
  Administered 2023-01-27: 10 mg via ORAL
  Filled 2023-01-27 (×2): qty 2

## 2023-01-27 MED ORDER — OXYCODONE HCL 5 MG PO TABS
5.0000 mg | ORAL_TABLET | ORAL | Status: DC | PRN
  Filled 2023-01-27: qty 1

## 2023-01-27 MED ORDER — HYDROMORPHONE HCL 1 MG/ML IJ SOLN
0.5000 mg | INTRAMUSCULAR | Status: DC | PRN
  Administered 2023-01-27: 0.5 mg via INTRAVENOUS
  Filled 2023-01-27 (×2): qty 0.5

## 2023-01-27 MED ORDER — ALBUTEROL SULFATE (2.5 MG/3ML) 0.083% IN NEBU: 3.0000 mL | INHALATION_SOLUTION | Freq: Four times a day (QID) | RESPIRATORY_TRACT | Status: DC | PRN

## 2023-01-27 MED ORDER — MORPHINE SULFATE (PF) 4 MG/ML IV SOLN
4.0000 mg | INTRAVENOUS | Status: AC | PRN
  Administered 2023-01-27 (×2): 4 mg via INTRAVENOUS
  Filled 2023-01-27 (×2): qty 1

## 2023-01-27 MED ORDER — ONDANSETRON HCL 4 MG/2ML IJ SOLN
4.0000 mg | Freq: Once | INTRAMUSCULAR | Status: AC
  Administered 2023-01-27: 4 mg via INTRAVENOUS
  Filled 2023-01-27: qty 2

## 2023-01-27 MED ORDER — HEPARIN SODIUM (PORCINE) 5000 UNIT/ML IJ SOLN
5000.0000 [IU] | Freq: Three times a day (TID) | INTRAMUSCULAR | Status: DC
  Administered 2023-01-27 – 2023-01-28 (×2): 5000 [IU] via SUBCUTANEOUS
  Filled 2023-01-27 (×2): qty 1

## 2023-01-27 MED ORDER — POLYETHYLENE GLYCOL 3350 17 G PO PACK
17.0000 g | PACK | Freq: Two times a day (BID) | ORAL | Status: DC
  Administered 2023-01-27 – 2023-02-03 (×11): 17 g via ORAL
  Filled 2023-01-27 (×12): qty 1

## 2023-01-27 NOTE — ED Notes (Signed)
Back from xray

## 2023-01-27 NOTE — Consult Note (Signed)
ORTHOPAEDIC CONSULTATION  REQUESTING PHYSICIAN: Elodia Florence., *  ASSESSMENT AND PLAN: 67 y.o. female with the following: Right Hip Intertrochanteric femur fracture  This patient requires inpatient admission to the hospitalist, to include preoperative clearance and perioperative medical management  - Weight Bearing Status/Activity: NWB Right lower extremity  - Additional recommended labs/tests: Preop Labs: CBC, BMP, PT/INR, Chest XR, and EKG  -VTE Prophylaxis: Please hold prior to OR; to resume POD#1 at the discretion of the primary team  - Pain control: Recommend PO pain medications PRN; judicious use of narcotics  - Follow-up plan: F/u 10-14 days postop  -Procedures: Plan for OR once patient has been medically optimized  Plan for Right Cephalomedullary nail  Last dose of Plavix was am 3/12.  Stop additional doses prior to surgery.  Bridge with heparin, none of morning of surgery.  Will plan for surgery on 01/29/2023.  N.p.o. the night before surgery.     Chief Complaint: Right hip pain  HPI: [name] is a 67 y.o. female who presented to the ED for evaluation after sustaining a mechanical fall.  She uses a cane or a walker to assist with ambulation.  She lost her balance.  She had immediate pain in the right hip.  She was brought to the emergency department via EMS.  She has noted to have a fracture of the right hip.  She has been admitted for medical optimization, and surgery.  She takes Plavix.  Last dose was this morning, while hospitalized.  No injuries elsewhere.  She did not hit her head.  No numbness or tingling.  Past Medical History:  Diagnosis Date   Adjustment disorder with depressed mood 01/11/2009   Arthritis    Asthma    Breast cancer ([place])    left breast   CAD 07/10/2008   Chronic back pain    COCAINE DEPENDENCY NOS 01/14/2007   stopped Cocaine 15 years    CONVULSIONS, SEIZURES, NOS 01/14/2007   last seizure May, 2016   COPD (chronic  obstructive pulmonary disease) ([place])    Coronary artery disease 01/14/2012   CVA 01/14/2007   Difficulty seeing    patient has glaucoma   Family history of anesthesia complication Q000111Q   Pts sister (31 years old) died after having anesthesia - induced a heart attcks   Glaucoma of both eyes    Headache(784.0)    HEPATITIS C 02/26/2009   Hepatitis C    Hyperlipemia    HYPERTENSION, BENIGN SYSTEMIC 01/14/2007   Memory loss    d/t CVA   MI (myocardial infarction) ([place]) 12/20/2007   had stent placed   Pneumonia    hx of   PTSD (post-traumatic stress disorder)    Right sided weakness    d/t stroke   Sciatica    Stroke [facility])    right side weakness   TOBACCO DEPENDENCE 01/14/2007   Past Surgical History:  Procedure Laterality Date   BREAST LUMPECTOMY Left 09/18/2017   Procedure: EVACUATION HEMATOMA  LEFT BREAST;  Surgeon: [name], MD;  Location: [place];  Service: General;  Laterality: Left;   CATARACT EXTRACTION W/PHACO Right 07/11/2015   Procedure: CATARACT EXTRACTION PHACO AND INTRAOCULAR LENS PLACEMENT ([place]) RIGHT,INTERNAL PLEB REVISION;  Surgeon: Marylynn Pearson, MD;  Location: [place];  Service: Ophthalmology;  Laterality: Right;   CESAREAN SECTION     x 2   COLONOSCOPY W/ POLYPECTOMY     CORONARY ANGIOPLASTY WITH STENT PLACEMENT  2009   1 stent  EYE SURGERY     MASTECTOMY W/ SENTINEL NODE BIOPSY Left 09/08/2017   Procedure: LEFT MASTECTOMY WITH SENTINEL LYMPH NODE BIOPSY;  Surgeon: [name], MD;  Location: [place];  Service: General;  Laterality: Left;   MITOMYCIN C APPLICATION Right [id number]   Procedure: MITOMYCIN C APPLICATION RIGHT EYE;  Surgeon: Marylynn Pearson, MD;  Location: [place];  Service: Ophthalmology;  Laterality: Right;   TONSILLECTOMY     TRABECULECTOMY Left 06/29/2013   Procedure: TRABECULECTOMY WITH MYTOMICIN C LEFT EYE;  Surgeon: Marylynn Pearson, MD;  Location: [place];  Service: Ophthalmology;  Laterality: Left;   TRABECULECTOMY Right 09/07/2013    Procedure: TRABECULECTOMY RIGHT EYE;  Surgeon: Marylynn Pearson, MD;  Location: [place];  Service: Ophthalmology;  Laterality: Right;   Social History   Socioeconomic History   Marital status: Single    Spouse name: Not on file   Number of children: 2   Years of education: Not on file   Highest education level: Not on file  Occupational History   Not on file  Tobacco Use   Smoking status: Some Days    Packs/day: 0.25    Years: 47.00    Total pack years: 11.75    Types: Cigarettes   Smokeless tobacco: Never  Vaping Use   Vaping Use: Never used  Substance and Sexual Activity   Alcohol use: No    Comment: quit 4 years ago   Drug use: No    Comment: last use of cocaine 20 years ago   Sexual activity: Never    Birth control/protection: Abstinence, Post-menopausal  Other Topics Concern   Not on file  Social History Narrative   On disability- due to CVA/ PTSD and MI-- weak on right side from stroke.       Has 2 children.  Living with sister here in [place].       Updated 02/01/20   Patient reports she lives alone.  Has an aide that comes daily to assist with ADL's. Is on the wait list for meals on wheels   Had poor eye sight difficult seeing    Social Determinants of Health   Financial Resource Strain: Not on file  Food Insecurity: Food Insecurity Present (01/27/2023)   Hunger Vital Sign    Worried About Running Out of Food in the Last Year: Sometimes true    Ran Out of Food in the Last Year: Sometimes true  Transportation Needs: Unmet Transportation Needs (01/27/2023)   PRAPARE - Hydrologist (Medical): Yes    Lack of Transportation (Non-Medical): Yes  Physical Activity: Not on file  Stress: Not on file  Social Connections: Not on file   Family History  Problem Relation Age of Onset   Stroke Mother    Colon cancer Paternal Uncle    Heart disease Sister    Colon cancer Paternal Aunt    Allergies  Allergen Reactions   Bee Venom Anaphylaxis    Other     Patient states she has been told not to take ibuprofen and tylenol   Prior to Admission medications   Medication Sig Start Date End Date Taking? Authorizing Provider  albuterol (PROVENTIL HFA;VENTOLIN HFA) 108 (90 BASE) MCG/ACT inhaler Inhale 2 puffs into the lungs every 6 (six) hours as needed for wheezing. For wheezing    [provider]  amLODipine (NORVASC) 10 MG tablet Take 10 mg by mouth daily. 08/11/22   [provider]  aspirin 81 MG EC tablet Take 81  mg by mouth daily.      [provider]  atorvastatin (LIPITOR) 20 MG tablet Take 1 tablet (20 mg total) by mouth daily. 12/26/19   [name], MD  atorvastatin (LIPITOR) 40 MG tablet Take 40 mg by mouth daily. 08/21/22   [provider]  carbamazepine (TEGRETOL) 200 MG tablet Take 200 mg by mouth 2 (two) times daily. 08/11/22   [provider]  carvedilol (COREG) 12.5 MG tablet Take 12.5 mg by mouth 2 (two) times daily. 08/11/22   [provider]  carvedilol (COREG) 6.25 MG tablet Take 1 tablet (6.25 mg total) by mouth 2 (two) times daily with a meal. 12/26/19   [name], MD  clopidogrel (PLAVIX) 75 MG tablet Take 75 mg by mouth daily.     [provider]  lisinopril-hydrochlorothiazide (ZESTORETIC) 20-12.5 MG tablet Take 1 tablet by mouth 2 (two) times daily. 08/11/22   [provider]  nitroGLYCERIN (NITROSTAT) 0.4 MG SL tablet Place 0.4 mg under the tongue every 5 (five) minutes as needed for chest pain.     [provider]  potassium chloride SA (K-DUR,KLOR-CON) 20 MEQ tablet Take 1 tablet (20 mEq total) by mouth daily. 10/24/18   Barton Dubois, MD   CT HIP RIGHT WO CONTRAST  Result Date: 01/27/2023 CLINICAL DATA:  Right hip pain status post fall. EXAM: CT OF THE RIGHT HIP WITHOUT CONTRAST TECHNIQUE: Multidetector CT imaging of the right hip was performed according to the standard protocol. Multiplanar CT image reconstructions were also  generated. RADIATION DOSE REDUCTION: This exam was performed according to the departmental dose-optimization program which includes automated exposure control, adjustment of the mA and/or kV according to patient size and/or use of iterative reconstruction technique. COMPARISON:  Radiographs 01/26/2023 and 04/10/2020. CT pelvis 01/20/2020. FINDINGS: Bones/Joint/Cartilage There is a comminuted, displaced and angulated intertrochanteric fracture of the right femur. This fracture results in moderate varus angulation and apex anterior angulation. The femoral head is located. There are mild underlying right hip degenerative changes and no significant hip joint effusion. No evidence of fracture of the right hemipelvis. Lower lumbar spondylosis noted. Ligaments Suboptimally assessed by CT. Muscles and Tendons No intramuscular fluid collection or focal atrophy identified. Soft tissues 2.6 x 1.6 cm soft tissue hematoma along the posteromedial aspect of the proximal femur fracture. No other focal fluid collections, foreign bodies or soft tissue emphysema. Iliofemoral atherosclerosis noted. IMPRESSION: 1. Comminuted, displaced and angulated intertrochanteric fracture of the right femur as described. 2. No other acute osseous findings identified. 3. Mild underlying right hip degenerative changes. Electronically Signed   By: Richardean Sale M.D.   On: 01/27/2023 11:48   DG Chest Port 1 View  Result Date: 01/27/2023 CLINICAL DATA:  preop respiratory exam EXAM: PORTABLE CHEST 1 VIEW COMPARISON:  Chest x-ray 01/25/2023 FINDINGS: The heart and mediastinal contours are unchanged. Aortic calcification. No focal consolidation. No pulmonary edema. No pleural effusion. No pneumothorax. No acute osseous abnormality. IMPRESSION: No active disease. Electronically Signed   By: Iven Finn M.D.   On: 01/27/2023 02:17   DG Hip Unilat  With Pelvis 2-3 Views Right  Result Date: 01/27/2023 CLINICAL DATA:  Fall with hip pain EXAM: DG  HIP (WITH OR WITHOUT PELVIS) 2-3V RIGHT COMPARISON:  04/10/2020 FINDINGS: Pubic symphysis appears intact. SI joints are non widened. Acute fracture of proximal right femur, appears to involves the basicervical/intertrochanteric region but limited due to positioning. Femoral head projects in joint. IMPRESSION: Acute proximal right femur fracture, appears to involve the  basicervical/intertrochanteric region but limited by positioning. Electronically Signed   By: Donavan Foil M.D.   On: 01/27/2023 00:19     Family History Reviewed and non-contributory, no pertinent history of problems with bleeding or anesthesia    Review of Systems No fevers or chills No numbness or tingling No chest pain No shortness of breath No bowel or bladder dysfunction No GI distress No headaches    OBJECTIVE  Vitals:Patient Vitals for the past 8 hrs:  BP Temp Temp src Pulse Resp SpO2  01/27/23 2040 108/61 98.4 F (36.9 C) Oral 74 20 99 %   General: Alert, no acute distress Cardiovascular: Extremities are warm Respiratory: No cyanosis, no use of accessory musculature Skin: No lesions in the area of chief complaint  Neurologic: Sensation intact distally  Psychiatric: She exhibits some confusion.  Answers most questions appropriately. Lymphatic: No swelling obvious and reported other than the area involved in the exam below Extremities  RLE: Extremity held in a fixed position.  ROM deferred due to known fracture.  Sensation is intact distally in the sural, saphenous, DP, SP, and plantar nerve distribution. 2+ DP pulse.  Toes are WWP.  Active motion intact in the TA/EHL/GS. LLE: Sensation is intact distally in the sural, saphenous, DP, SP, and plantar nerve distribution. 2+ DP pulse.  Toes are WWP.  Active motion intact in the TA/EHL/GS. Tolerates gentle ROM of the hip.  No pain with axial loading.     Test Results Imaging XR and CT scan of the Right hip demonstrates a comminuted, minimally displaced  Intertrochanteric femur fracture.  Labs cbc Recent Labs    01/25/23 0003 01/27/23 0202  WBC 9.6 15.0*  HGB 12.2 11.1*  HCT 35.3* 32.6*  PLT 325 300     Labs coag Recent Labs    01/27/23 0202  INR 1.1    Recent Labs    01/25/23 0003 01/27/23 0202  NA 133* 132*  K 4.0 3.5  CL 98 100  CO2 28 27  GLUCOSE 129* 123*  BUN 10 13  CREATININE 0.90 0.86  CALCIUM 9.6 9.5

## 2023-01-27 NOTE — Progress Notes (Addendum)
PROGRESS NOTE    [name]  [id number] DOB: 08-29-1956 DOA: 01/26/2023 PCP: [name], MD  Chief Complaint  Patient presents with   [name] Hip Pain    Brief Narrative:    [name] is [name] 67 y.o. female with medical history significant of hypertension, hyperlipidemia, COPD, coronary artery disease, stroke, left breast cancer who presents to the emergency department due to [name] fall sustained at home.  Patient complained of right hip pain, she denies hitting her head, LOC.  She takes Plavix, but denies use of any anticoagulant.   Awaiting ortho eval and surgery, likely 3/14 AM after holding plavix x48 hrs.  Assessment & Plan:   Principal Problem:   Right femoral fracture (HCC) Active Problems:   TOBACCO DEPENDENCE   Essential hypertension   Mixed hyperlipidemia   Underweight   COPD (chronic obstructive pulmonary disease) (HCC)  Right femoral fracture CT with comminuted, displaced and angulated intertrochanteric fracture of the right femur Continue pain management - scheduled APAP, oxy, dilaudid.  Bowel regimen Ortho planning for surgery 3/14 AM after plavix held x48 hrs Pending consult     Essential Hypertension Continue Norvasc, coreg Lisinopril and HCTZ on hold   Mixed hyperlipidemia Continue atorvastatin   Coronary artery disease Continue aspirin, plavix (on hold), Coreg, atorvastatin ? Need for continued DAPT?  Seems like she's been on this for years (noted in DC summary from 2019).  Would discuss in follow up with primary prescriber.   COPD (not in acute exacerbation) Stable on room air Continue albuterol   Left breast cancer Status post left mastectomy 2018   Tobacco abuse She continues to smoke 1 pack/week She has smoked over 50 years Encourage cessation  Underweight Body mass index is 16.65 kg/m. Appreciate RD recs    DVT prophylaxis: ppx heparin, hold night prior to surgery Code Status: full Family Communication: none Disposition:    Status is: Inpatient Remains inpatient appropriate because: need for ortho intervention   Consultants:  orthopedics  Procedures:  none  Antimicrobials:  Anti-infectives (From admission, onward)    Start     Dose/Rate Route Frequency Ordered Stop   01/29/23 0600  ceFAZolin (ANCEF) IVPB 2g/100 mL premix        2 g 200 mL/hr over 30 Minutes Intravenous On call to O.R. 01/27/23 1325 01/30/23 0559       Subjective: No new complaints  Objective: Vitals:   01/27/23 0500 01/27/23 0545 01/27/23 0828 01/27/23 1412  BP: (!) 143/75 (!) 158/83 (!) 130/91 122/77  Pulse: 68 (!) 50 (!) 54 65  Resp: '19 18 18 17  '$ Temp:  97.9 F (36.6 C) 98.5 F (36.9 C) 98.3 F (36.8 C)  TempSrc:  Oral Oral Oral  SpO2: 100% 100% 100% 100%  Weight:      Height:        Intake/Output Summary (Last 24 hours) at 01/27/2023 1703 Last data filed at 01/27/2023 1308 Gross per 24 hour  Intake 242 ml  Output --  Net 242 ml   Filed Weights   01/26/23 2335  Weight: 44 kg    Examination:  General exam: Appears calm and comfortable, cachetic Respiratory system: Clear to auscultation. Respiratory effort normal. Cardiovascular system: RRR Gastrointestinal system: Abdomen is nondistended, soft and nontender. Central nervous system: Alert and oriented. No focal neurological deficits. Extremities: TTP to R hip, palpable distal pulses   Data Reviewed: I have personally reviewed following labs and imaging studies  CBC: Recent Labs  Lab  01/25/23 0003 01/27/23 0202  WBC 9.6 15.0*  NEUTROABS  --  12.9*  HGB 12.2 11.1*  HCT 35.3* 32.6*  MCV 85.7 86.0  PLT 325 [id number]    Basic Metabolic Panel: Recent Labs  Lab 01/25/23 0003 01/27/23 0202 01/27/23 0708  NA 133* 132*  --   K 4.0 3.5  --   CL 98 100  --   CO2 28 27  --   GLUCOSE 129* 123*  --   BUN 10 13  --   CREATININE 0.90 0.86  --   CALCIUM 9.6 9.5  --   MG  --   --  2.1  PHOS  --   --  3.6    GFR: Estimated Creatinine Clearance: 43.5  mL/min (by C-G formula based on SCr of 0.86 mg/dL).  Liver Function Tests: Recent Labs  Lab 01/25/23 0003  AST 24  ALT 15  ALKPHOS 52  BILITOT 0.5  PROT 8.0  ALBUMIN 4.2    CBG: No results for input(s): "GLUCAP" in the last 168 hours.   No results found for this or any previous visit (from the past 240 hour(s)).       Radiology Studies: CT HIP RIGHT WO CONTRAST  Result Date: 01/27/2023 CLINICAL DATA:  Right hip pain status post fall. EXAM: CT OF THE RIGHT HIP WITHOUT CONTRAST TECHNIQUE: Multidetector CT imaging of the right hip was performed according to the standard protocol. Multiplanar CT image reconstructions were also generated. RADIATION DOSE REDUCTION: This exam was performed according to the departmental dose-optimization program which includes automated exposure control, adjustment of the mA and/or kV according to patient size and/or use of iterative reconstruction technique. COMPARISON:  Radiographs 01/26/2023 and 04/10/2020. CT pelvis 01/20/2020. FINDINGS: Bones/Joint/Cartilage There is Chaunce Winkels comminuted, displaced and angulated intertrochanteric fracture of the right femur. This fracture results in moderate varus angulation and apex anterior angulation. The femoral head is located. There are mild underlying right hip degenerative changes and no significant hip joint effusion. No evidence of fracture of the right hemipelvis. Lower lumbar spondylosis noted. Ligaments Suboptimally assessed by CT. Muscles and Tendons No intramuscular fluid collection or focal atrophy identified. Soft tissues 2.6 x 1.6 cm soft tissue hematoma along the posteromedial aspect of the proximal femur fracture. No other focal fluid collections, foreign bodies or soft tissue emphysema. Iliofemoral atherosclerosis noted. IMPRESSION: 1. Comminuted, displaced and angulated intertrochanteric fracture of the right femur as described. 2. No other acute osseous findings identified. 3. Mild underlying right hip  degenerative changes. Electronically Signed   By: Richardean Sale M.D.   On: 01/27/2023 11:48   DG Chest Port 1 View  Result Date: 01/27/2023 CLINICAL DATA:  preop respiratory exam EXAM: PORTABLE CHEST 1 VIEW COMPARISON:  Chest x-ray 01/25/2023 FINDINGS: The heart and mediastinal contours are unchanged. Aortic calcification. No focal consolidation. No pulmonary edema. No pleural effusion. No pneumothorax. No acute osseous abnormality. IMPRESSION: No active disease. Electronically Signed   By: Iven Finn M.D.   On: 01/27/2023 02:17   DG Hip Unilat  With Pelvis 2-3 Views Right  Result Date: 01/27/2023 CLINICAL DATA:  Fall with hip pain EXAM: DG HIP (WITH OR WITHOUT PELVIS) 2-3V RIGHT COMPARISON:  04/10/2020 FINDINGS: Pubic symphysis appears intact. SI joints are non widened. Acute fracture of proximal right femur, appears to involves the basicervical/intertrochanteric region but limited due to positioning. Femoral head projects in joint. IMPRESSION: Acute proximal right femur fracture, appears to involve the basicervical/intertrochanteric region but limited by positioning. Electronically Signed  By: Donavan Foil M.D.   On: 01/27/2023 00:19        Scheduled Meds:  acetaminophen  1,000 mg Oral Q8H   amLODipine  10 mg Oral Daily   atorvastatin  40 mg Oral Daily   carvedilol  12.5 mg Oral BID   clopidogrel  75 mg Oral Daily   heparin injection (subcutaneous)  5,000 Units Subcutaneous Q8H   Continuous Infusions:  [START ON 01/29/2023]  ceFAZolin (ANCEF) IV     [START ON 01/29/2023] tranexamic acid       LOS: 0 days    Time spent: over 03 min    Fayrene Helper, MD [facility]   To contact the attending provider between 7A-7P or the covering provider during after hours 7P-7A, please log into the web site [email or web address] and access using universal [place] password for that web site. If you do not have the password, please call the hospital operator.  01/27/2023, 5:03 PM

## 2023-01-27 NOTE — H&P (Signed)
History and Physical    Patient: [name] [id number] DOB: 03-05-1956 DOA: 01/26/2023 DOS: the patient was seen and examined on 01/27/2023 PCP: [name], MD  Patient coming from: Home  Chief Complaint:  Chief Complaint  Patient presents with   Fall   Rt Hip Pain   HPI: [name] is a 67 y.o. female with medical history significant of hypertension, hyperlipidemia, COPD, coronary artery disease, stroke, left breast cancer who presents to the emergency department due to a fall sustained at home.  Patient complained of right hip pain, she denies hitting her head, LOC.  She takes Plavix, but denies use of any anticoagulant.  ED Course:  In the emergency department, BP was 140/70, pulse 57, other vital signs were within normal range.  Workup in the ED showed normocytic anemia and WBC of 15.0.  BMP was normal except for sodium of 132 and blood glucose of 123. Chest x-ray showed no active disease Right hip x-ray showed acute  proximal right femur fracture, appears to involve the basicervical/intertrochanteric region but limited by positioning. Patient was treated with IV morphine 4 mg and Zofran. Orthopedic surgeon (Dr. Amedeo Kinsman) was consulted and recommended admitting patient with plan to follow-up with patient in the morning.  Hospitalist was asked to admit patient for further evaluation and management.  Review of Systems: Review of systems as noted in the HPI. All other systems reviewed and are negative.   Past Medical History:  Diagnosis Date   Adjustment disorder with depressed mood 01/11/2009   Arthritis    Asthma    Breast cancer ([place])    left breast   CAD 07/10/2008   Chronic back pain    COCAINE DEPENDENCY NOS 01/14/2007   stopped Cocaine 15 years    CONVULSIONS, SEIZURES, NOS 01/14/2007   last seizure May, 2016   COPD (chronic obstructive pulmonary disease) ([place])    Coronary artery disease 01/14/2012   CVA 01/14/2007   Difficulty seeing    patient has glaucoma    Family history of anesthesia complication [id number]   Pts sister (56 years old) died after having anesthesia - induced a heart attcks   Glaucoma of both eyes    Headache(784.0)    HEPATITIS C 02/26/2009   Hepatitis C    Hyperlipemia    HYPERTENSION, BENIGN SYSTEMIC 01/14/2007   Memory loss    d/t CVA   MI (myocardial infarction) ([place]) 12/20/2007   had stent placed   Pneumonia    hx of   PTSD (post-traumatic stress disorder)    Right sided weakness    d/t stroke   Sciatica    Stroke [facility])    right side weakness   TOBACCO DEPENDENCE 01/14/2007   Past Surgical History:  Procedure Laterality Date   BREAST LUMPECTOMY Left 09/18/2017   Procedure: EVACUATION HEMATOMA  LEFT BREAST;  Surgeon: [name], MD;  Location: [place];  Service: General;  Laterality: Left;   CATARACT EXTRACTION W/PHACO Right 07/11/2015   Procedure: CATARACT EXTRACTION PHACO AND INTRAOCULAR LENS PLACEMENT ([place]) RIGHT,INTERNAL PLEB REVISION;  Surgeon: Marylynn Pearson, MD;  Location: [employer];  Service: Ophthalmology;  Laterality: Right;   CESAREAN SECTION     x 2   COLONOSCOPY W/ POLYPECTOMY     CORONARY ANGIOPLASTY WITH STENT PLACEMENT  2009   1 stent   EYE SURGERY     MASTECTOMY W/ SENTINEL NODE BIOPSY Left 09/08/2017   Procedure: LEFT MASTECTOMY WITH SENTINEL LYMPH NODE BIOPSY;  Surgeon: [name], MD;  Location:  [place] [facility];  Service: General;  Laterality: Left;   MITOMYCIN C APPLICATION Right [id number]   Procedure: MITOMYCIN C APPLICATION RIGHT EYE;  Surgeon: Marylynn Pearson, MD;  Location: [place];  Service: Ophthalmology;  Laterality: Right;   TONSILLECTOMY     TRABECULECTOMY Left 06/29/2013   Procedure: TRABECULECTOMY WITH MYTOMICIN C LEFT EYE;  Surgeon: Marylynn Pearson, MD;  Location: [place];  Service: Ophthalmology;  Laterality: Left;   TRABECULECTOMY Right 09/07/2013   Procedure: TRABECULECTOMY RIGHT EYE;  Surgeon: Marylynn Pearson, MD;  Location: [place];  Service: Ophthalmology;  Laterality: Right;    Social  History:  reports that she has been smoking cigarettes. She has a 11.75 pack-year smoking history. She has never used smokeless tobacco. She reports that she does not drink alcohol and does not use drugs.   Allergies  Allergen Reactions   Bee Venom Anaphylaxis   Other     Patient states she has been told not to take ibuprofen and tylenol    Family History  Problem Relation Age of Onset   Stroke Mother    Colon cancer Paternal Uncle    Heart disease Sister    Colon cancer Paternal Aunt      Prior to Admission medications   Medication Sig Start Date End Date Taking? Authorizing Provider  albuterol (PROVENTIL HFA;VENTOLIN HFA) 108 (90 BASE) MCG/ACT inhaler Inhale 2 puffs into the lungs every 6 (six) hours as needed for wheezing. For wheezing    [provider]  amLODipine (NORVASC) 10 MG tablet Take 10 mg by mouth daily. 08/11/22   [provider]  aspirin 81 MG EC tablet Take 81 mg by mouth daily.      [provider]  atorvastatin (LIPITOR) 20 MG tablet Take 1 tablet (20 mg total) by mouth daily. 12/26/19   [name], MD  atorvastatin (LIPITOR) 40 MG tablet Take 40 mg by mouth daily. 08/21/22   [provider]  carbamazepine (TEGRETOL) 200 MG tablet Take 200 mg by mouth 2 (two) times daily. 08/11/22   [provider]  carvedilol (COREG) 12.5 MG tablet Take 12.5 mg by mouth 2 (two) times daily. 08/11/22   [provider]  carvedilol (COREG) 6.25 MG tablet Take 1 tablet (6.25 mg total) by mouth 2 (two) times daily with a meal. 12/26/19   [name], MD  clopidogrel (PLAVIX) 75 MG tablet Take 75 mg by mouth daily.     [provider]  lisinopril-hydrochlorothiazide (ZESTORETIC) 20-12.5 MG tablet Take 1 tablet by mouth 2 (two) times daily. 08/11/22   [provider]  nitroGLYCERIN (NITROSTAT) 0.4 MG SL tablet Place 0.4 mg under the tongue every 5 (five) minutes as needed for chest pain.     [provider]   potassium chloride SA (K-DUR,KLOR-CON) 20 MEQ tablet Take 1 tablet (20 mEq total) by mouth daily. 10/24/18   Barton Dubois, MD    Physical Exam: BP (!) 158/83   Pulse (!) 50   Temp 97.9 F (36.6 C) (Oral)   Resp 18   Ht '5\' 4"'$  (1.626 m)   Wt 44 kg   SpO2 100%   BMI 16.65 kg/m   General: 67 y.o. year-old female well developed well nourished in no acute distress.  Alert and oriented x3. HEENT: NCAT, EOMI Neck: Supple, trachea medial Cardiovascular: Regular rate and rhythm with no rubs or gallops.  No thyromegaly or JVD noted.  No lower extremity edema. 2/4 pulses in all 4 extremities. Respiratory: Clear to auscultation with no  wheezes or rales. Good inspiratory effort. Abdomen: Soft, nontender nondistended with normal bowel sounds x4 quadrants. Muskuloskeletal: Tender to palpation of right hip.  Decreased ROM of RLE due to pain.  Right leg appears to be slightly shortened.  Neuro: CN II-XII intact, strength 5/5 x 4, sensation, reflexes intact Skin: No ulcerative lesions noted or rashes Psychiatry: Judgement and insight appear normal. Mood is appropriate for condition and setting          Labs on Admission:  Basic Metabolic Panel: Recent Labs  Lab 01/25/23 0003 01/27/23 0202  NA 133* 132*  K 4.0 3.5  CL 98 100  CO2 28 27  GLUCOSE 129* 123*  BUN 10 13  CREATININE 0.90 0.86  CALCIUM 9.6 9.5   Liver Function Tests: Recent Labs  Lab 01/25/23 0003  AST 24  ALT 15  ALKPHOS 52  BILITOT 0.5  PROT 8.0  ALBUMIN 4.2   No results for input(s): "LIPASE", "AMYLASE" in the last 168 hours. No results for input(s): "AMMONIA" in the last 168 hours. CBC: Recent Labs  Lab 01/25/23 0003 01/27/23 0202  WBC 9.6 15.0*  NEUTROABS  --  12.9*  HGB 12.2 11.1*  HCT 35.3* 32.6*  MCV 85.7 86.0  PLT 325 300   Cardiac Enzymes: No results for input(s): "CKTOTAL", "CKMB", "CKMBINDEX", "TROPONINI" in the last 168 hours.  BNP (last 3 results) No results for input(s): "BNP" in the  last 8760 hours.  ProBNP (last 3 results) No results for input(s): "PROBNP" in the last 8760 hours.  CBG: No results for input(s): "GLUCAP" in the last 168 hours.  Radiological Exams on Admission: DG Chest Port 1 View  Result Date: 01/27/2023 CLINICAL DATA:  preop respiratory exam EXAM: PORTABLE CHEST 1 VIEW COMPARISON:  Chest x-ray 01/25/2023 FINDINGS: The heart and mediastinal contours are unchanged. Aortic calcification. No focal consolidation. No pulmonary edema. No pleural effusion. No pneumothorax. No acute osseous abnormality. IMPRESSION: No active disease. Electronically Signed   By: Iven Finn M.D.   On: 01/27/2023 02:17   DG Hip Unilat  With Pelvis 2-3 Views Right  Result Date: 01/27/2023 CLINICAL DATA:  Fall with hip pain EXAM: DG HIP (WITH OR WITHOUT PELVIS) 2-3V RIGHT COMPARISON:  04/10/2020 FINDINGS: Pubic symphysis appears intact. SI joints are non widened. Acute fracture of proximal right femur, appears to involves the basicervical/intertrochanteric region but limited due to positioning. Femoral head projects in joint. IMPRESSION: Acute proximal right femur fracture, appears to involve the basicervical/intertrochanteric region but limited by positioning. Electronically Signed   By: Donavan Foil M.D.   On: 01/27/2023 00:19    EKG: I independently viewed the EKG done and my findings are as followed:   Assessment/Plan Present on Admission:  Right femoral fracture ([place])  TOBACCO DEPENDENCE  Mixed hyperlipidemia  Underweight  Essential hypertension  COPD (chronic obstructive pulmonary disease) (HCC)  Principal Problem:   Right femoral fracture (HCC) Active Problems:   TOBACCO DEPENDENCE   Essential hypertension   Mixed hyperlipidemia   Underweight   COPD (chronic obstructive pulmonary disease) (HCC)  Right femoral fracture Continue IV Dilaudid 0.5 mg every 3 hours as needed Continue fall precaution Orthopedic surgery was already consulted by EDP and will  follow-up with patient in the morning; it is unlikely that patient will be operated this morning due to being on Plavix as reported by EDP  Essential Hypertension Continue Norvasc, coreg, lisinopril   Mixed hyperlipidemia Continue atorvastatin   Coronary artery disease Continue aspirin, plavix, Coreg, atorvastatin  COPD (not  in acute exacerbation) Stable on room air Continue albuterol   Underweight BMI 16.65   Left breast cancer Status post left mastectomy 2018   Tobacco abuse She continues to smoke 1 pack/week She has smoked over 50 years Patient was counseled on tobacco cessation      DVT prophylaxis: Heparin subcu (consider holding) once timing for surgery is known)  Code Status: Code status   Family Communication: None at bedside  Consults: Orthopedic surgery (Dr. Amedeo Kinsman)  Severity of Illness: The appropriate patient status for this patient is INPATIENT. Inpatient status is judged to be reasonable and necessary in order to provide the required intensity of service to ensure the patient's safety. The patient's presenting symptoms, physical exam findings, and initial radiographic and laboratory data in the context of their chronic comorbidities is felt to place them at high risk for further clinical deterioration. Furthermore, it is not anticipated that the patient will be medically stable for discharge from the hospital within 2 midnights of admission.   * I certify that at the point of admission it is my clinical judgment that the patient will require inpatient hospital care spanning beyond 2 midnights from the point of admission due to high intensity of service, high risk for further deterioration and high frequency of surveillance required.*  Author: Bernadette Hoit, DO 01/27/2023 7:29 AM  For on call review [email or web address].

## 2023-01-27 NOTE — Progress Notes (Signed)
Patient  c/o right hip pain this am, Dilaudid 0.5 mg given IV per MAR orders. Call bell in reach. Plan of care on going.

## 2023-01-27 NOTE — ED Provider Notes (Signed)
[facility] Provider Note   CSN: [id number] Arrival date & time: 01/26/23  2328     History  Chief Complaint  Patient presents with   Fall   Rt Hip Pain    [name] is a 67 y.o. female.  The history is provided by the patient.  Fall  She has history of hypertension, hyperlipidemia, breast cancer, COPD, hepatitis C, coronary artery disease and comes in following a fall at home.  She is complaining of pain in her right hip.  She denies head injury or loss of consciousness.  She is not on any anticoagulants, but does take clopidogrel.   Home Medications Prior to Admission medications   Medication Sig Start Date End Date Taking? Authorizing Provider  albuterol (PROVENTIL HFA;VENTOLIN HFA) 108 (90 BASE) MCG/ACT inhaler Inhale 2 puffs into the lungs every 6 (six) hours as needed for wheezing. For wheezing    [provider]  amLODipine (NORVASC) 10 MG tablet Take 10 mg by mouth daily. 08/11/22   [provider]  aspirin 81 MG EC tablet Take 81 mg by mouth daily.      [provider]  atorvastatin (LIPITOR) 20 MG tablet Take 1 tablet (20 mg total) by mouth daily. 12/26/19   [name], MD  atorvastatin (LIPITOR) 40 MG tablet Take 40 mg by mouth daily. 08/21/22   [provider]  carbamazepine (TEGRETOL) 200 MG tablet Take 200 mg by mouth 2 (two) times daily. 08/11/22   [provider]  carvedilol (COREG) 12.5 MG tablet Take 12.5 mg by mouth 2 (two) times daily. 08/11/22   [provider]  carvedilol (COREG) 6.25 MG tablet Take 1 tablet (6.25 mg total) by mouth 2 (two) times daily with a meal. 12/26/19   [name], MD  clopidogrel (PLAVIX) 75 MG tablet Take 75 mg by mouth daily.     [provider]  lisinopril-hydrochlorothiazide (ZESTORETIC) 20-12.5 MG tablet Take 1 tablet by mouth 2 (two) times daily. 08/11/22   [provider]  nitroGLYCERIN (NITROSTAT) 0.4 MG SL  tablet Place 0.4 mg under the tongue every 5 (five) minutes as needed for chest pain.     [provider]  potassium chloride SA (K-DUR,KLOR-CON) 20 MEQ tablet Take 1 tablet (20 mEq total) by mouth daily. 10/24/18   Barton Dubois, MD      Allergies    Bee venom and Other    Review of Systems   Review of Systems  All other systems reviewed and are negative.   Physical Exam Updated Vital Signs BP (!) 169/71   Pulse (!) 51   Resp 16   Ht '5\' 4"'$  (1.626 m)   Wt 44 kg   SpO2 100%   BMI 16.65 kg/m  Physical Exam Vitals and nursing note reviewed.   67 year old female, resting comfortably and in no acute distress. Vital signs are significant for elevated blood pressure and slightly slow heart rate. Oxygen saturation is 100%, which is normal. Head is normocephalic and atraumatic. PERRLA, EOMI. Oropharynx is clear. Neck is nontender and supple without adenopathy or JVD. Back is nontender and there is no CVA tenderness. Lungs are clear without rales, wheezes, or rhonchi. Chest is nontender. Heart has regular rate and rhythm without murmur. Abdomen is soft, flat, nontender. Extremities: Right leg is slightly shortened but not rotated.  There is marked pain with any passive movement of the right hip.  Distal pulses and sensation are normal. Skin  is warm and dry without rash. Neurologic: Mental status is normal, cranial nerves are intact, moves both arms and left leg equally.  ED Results / Procedures / Treatments   Labs (all labs ordered are listed, but only abnormal results are displayed) Labs Reviewed  BASIC METABOLIC PANEL - Abnormal; Notable for the following components:      Result Value   Sodium 132 (*)    Glucose, Bld 123 (*)    All other components within normal limits  CBC WITH DIFFERENTIAL/PLATELET - Abnormal; Notable for the following components:   WBC 15.0 (*)    RBC 3.79 (*)    Hemoglobin 11.1 (*)    HCT 32.6 (*)    Neutro Abs 12.9 (*)    All other components  within normal limits  PROTIME-INR  TYPE AND SCREEN    EKG EKG Interpretation  Date/Time:  Tuesday January 27 2023 01:54:18 EDT Ventricular Rate:  55 PR Interval:  179 QRS Duration: 93 QT Interval:  461 QTC Calculation: 441 R Axis:   88 Text Interpretation: Sinus rhythm Borderline right axis deviation Left ventricular hypertrophy ST elevation, consider anterior injury Baseline wander in lead(s) V4 When compared with ECG of 01/24/2023, No significant change was found Confirmed by Delora Fuel ([id number]) on 01/27/2023 2:40:27 AM  Radiology DG Chest Port 1 View  Result Date: 01/27/2023 CLINICAL DATA:  preop respiratory exam EXAM: PORTABLE CHEST 1 VIEW COMPARISON:  Chest x-ray 01/25/2023 FINDINGS: The heart and mediastinal contours are unchanged. Aortic calcification. No focal consolidation. No pulmonary edema. No pleural effusion. No pneumothorax. No acute osseous abnormality. IMPRESSION: No active disease. Electronically Signed   By: Iven Finn M.D.   On: 01/27/2023 02:17   DG Hip Unilat  With Pelvis 2-3 Views Right  Result Date: 01/27/2023 CLINICAL DATA:  Fall with hip pain EXAM: DG HIP (WITH OR WITHOUT PELVIS) 2-3V RIGHT COMPARISON:  04/10/2020 FINDINGS: Pubic symphysis appears intact. SI joints are non widened. Acute fracture of proximal right femur, appears to involves the basicervical/intertrochanteric region but limited due to positioning. Femoral head projects in joint. IMPRESSION: Acute proximal right femur fracture, appears to involve the basicervical/intertrochanteric region but limited by positioning. Electronically Signed   By: Donavan Foil M.D.   On: 01/27/2023 00:19    Procedures Procedures  Cardiac monitor shows normal sinus rhythm, per my interpretation.  Medications Ordered in ED Medications  morphine (PF) 4 MG/ML injection 4 mg (has no administration in time range)  ondansetron (ZOFRAN) injection 4 mg (has no administration in time range)    ED Course/ Medical  Decision Making/ A&P                             Medical Decision Making Amount and/or Complexity of Data Reviewed Labs: ordered. Radiology: ordered.  Risk Prescription drug management. Decision regarding hospitalization.   Fall with right hip injury.  X-rays show right hip fracture with radiologist interpretation suggesting that it was intertrochanteric, but it appears to be a femoral neck fracture per my interpretation.  I have started her on the ED hip fracture protocol with appropriate labs ordered.  I have ordered an electrocardiogram and chest x-ray for preop purposes.  I have ordered morphine for pain.  Chest x-ray shows no acute process.  Have independently viewed the image, and agree with radiologist interpretation.  I have reviewed and interpreted her electrocardiogram and my interpretation is nonspecific ST changes which are unchanged from prior.  I have  reviewed and interpreted her laboratory tests and my interpretation is mild hyponatremia which is not felt to be clinically significant, mildly elevated random glucose, mild anemia.  Leukocytosis is present and likely stress related and not felt to represent infection.  I have discussed the case with Dr. Amedeo Kinsman of orthopedics who agrees to see the patient in consultation but states that surgery needs to be delayed until she has been off of Plavix for 48 hours.  He has reviewed her x-rays, and agrees that the fracture site appears to be femoral neck and not intertrochanteric.  I have discussed the case with Dr. Josephine Cables of [facility], who agrees to admit the patient.  Final Clinical Impression(s) / ED Diagnoses Final diagnoses:  None    Rx / DC Orders ED Discharge Orders     None         Delora Fuel, MD [id number] [phone]

## 2023-01-28 DIAGNOSIS — S72141A Displaced intertrochanteric fracture of right femur, initial encounter for closed fracture: Secondary | ICD-10-CM | POA: Diagnosis not present

## 2023-01-28 DIAGNOSIS — S72001A Fracture of unspecified part of neck of right femur, initial encounter for closed fracture: Secondary | ICD-10-CM | POA: Diagnosis not present

## 2023-01-28 DIAGNOSIS — E871 Hypo-osmolality and hyponatremia: Secondary | ICD-10-CM | POA: Diagnosis not present

## 2023-01-28 LAB — CBC
HCT: 28.2 % — ABNORMAL LOW (ref 36.0–46.0)
Hemoglobin: 9.7 g/dL — ABNORMAL LOW (ref 12.0–15.0)
MCH: 29.1 pg (ref 26.0–34.0)
MCHC: 34.4 g/dL (ref 30.0–36.0)
MCV: 84.7 fL (ref 80.0–100.0)
Platelets: 270 10*3/uL (ref 150–400)
RBC: 3.33 MIL/uL — ABNORMAL LOW (ref 3.87–5.11)
RDW: 14.6 % (ref 11.5–15.5)
WBC: 9 10*3/uL (ref 4.0–10.5)
nRBC: 0 % (ref 0.0–0.2)

## 2023-01-28 LAB — RAPID URINE DRUG SCREEN, HOSP PERFORMED
Amphetamines: NOT DETECTED
Barbiturates: NOT DETECTED
Benzodiazepines: NOT DETECTED
Cocaine: POSITIVE — AB
Opiates: POSITIVE — AB
Tetrahydrocannabinol: POSITIVE — AB

## 2023-01-28 LAB — COMPREHENSIVE METABOLIC PANEL
ALT: 16 U/L (ref 0–44)
AST: 16 U/L (ref 15–41)
Albumin: 3.3 g/dL — ABNORMAL LOW (ref 3.5–5.0)
Alkaline Phosphatase: 39 U/L (ref 38–126)
Anion gap: 8 (ref 5–15)
BUN: 19 mg/dL (ref 8–23)
CO2: 26 mmol/L (ref 22–32)
Calcium: 9.1 mg/dL (ref 8.9–10.3)
Chloride: 100 mmol/L (ref 98–111)
Creatinine, Ser: 1.09 mg/dL — ABNORMAL HIGH (ref 0.44–1.00)
GFR, Estimated: 55 mL/min — ABNORMAL LOW (ref >60)
Glucose, Bld: 110 mg/dL — ABNORMAL HIGH (ref 70–99)
Potassium: 3.7 mmol/L (ref 3.5–5.1)
Sodium: 134 mmol/L — ABNORMAL LOW (ref 135–145)
Total Bilirubin: 0.6 mg/dL (ref 0.3–1.2)
Total Protein: 6.5 g/dL (ref 6.5–8.1)

## 2023-01-28 LAB — VITAMIN D 25 HYDROXY (VIT D DEFICIENCY, FRACTURES): Vit D, 25-Hydroxy: 19.7 ng/mL — ABNORMAL LOW (ref 30–100)

## 2023-01-28 LAB — ETHANOL: Alcohol, Ethyl (B): <10 mg/dL (ref <10)

## 2023-01-28 MED ORDER — VITAMIN D (ERGOCALCIFEROL) 1.25 MG (50000 UNIT) PO CAPS
50000.0000 [IU] | ORAL_CAPSULE | ORAL | Status: DC
  Administered 2023-01-28: 50000 [IU] via ORAL
  Filled 2023-01-28: qty 1

## 2023-01-28 MED ORDER — BOOST / RESOURCE BREEZE PO LIQD CUSTOM
1.0000 | Freq: Three times a day (TID) | ORAL | Status: DC
  Administered 2023-01-28 (×3): 1 via ORAL

## 2023-01-28 MED ORDER — LORAZEPAM 2 MG/ML IJ SOLN: 1.0000 mg | INTRAMUSCULAR | Status: AC | PRN

## 2023-01-28 MED ORDER — ENSURE ENLIVE PO LIQD
237.0000 mL | Freq: Two times a day (BID) | ORAL | Status: DC
  Administered 2023-01-29 – 2023-02-03 (×10): 237 mL via ORAL

## 2023-01-28 MED ORDER — THIAMINE MONONITRATE 100 MG PO TABS
100.0000 mg | ORAL_TABLET | Freq: Every day | ORAL | Status: DC
  Administered 2023-01-28 – 2023-02-03 (×5): 100 mg via ORAL
  Filled 2023-01-28 (×5): qty 1

## 2023-01-28 MED ORDER — THIAMINE HCL 100 MG/ML IJ SOLN
100.0000 mg | Freq: Every day | INTRAMUSCULAR | Status: DC
  Administered 2023-01-29 – 2023-01-30 (×2): 100 mg via INTRAVENOUS
  Filled 2023-01-28 (×2): qty 2

## 2023-01-28 MED ORDER — HYDROMORPHONE HCL 1 MG/ML IJ SOLN
0.5000 mg | INTRAMUSCULAR | Status: DC | PRN
  Administered 2023-01-28 – 2023-02-02 (×21): 0.5 mg via INTRAVENOUS
  Filled 2023-01-28 (×22): qty 0.5

## 2023-01-28 MED ORDER — SODIUM CHLORIDE 0.9 % IV SOLN: INTRAVENOUS | Status: DC

## 2023-01-28 MED ORDER — ADULT MULTIVITAMIN W/MINERALS CH
1.0000 | ORAL_TABLET | Freq: Every day | ORAL | Status: DC
  Administered 2023-01-28 – 2023-02-03 (×6): 1 via ORAL
  Filled 2023-01-28 (×7): qty 1

## 2023-01-28 MED ORDER — FOLIC ACID 1 MG PO TABS
1.0000 mg | ORAL_TABLET | Freq: Every day | ORAL | Status: DC
  Administered 2023-01-28 – 2023-02-03 (×7): 1 mg via ORAL
  Filled 2023-01-28 (×7): qty 1

## 2023-01-28 MED ORDER — OXYCODONE HCL 5 MG PO TABS
5.0000 mg | ORAL_TABLET | ORAL | Status: DC | PRN
  Administered 2023-01-29 – 2023-02-03 (×11): 5 mg via ORAL
  Filled 2023-01-28 (×11): qty 1

## 2023-01-28 MED ORDER — LORAZEPAM 1 MG PO TABS: 1.0000 mg | ORAL_TABLET | ORAL | Status: AC | PRN

## 2023-01-28 NOTE — Progress Notes (Signed)
Patient's B/P 96/68,hr 75, I held patient's blood pressure medications this am, MD notified. Patient very drowsy this am. Bed alarm on,call bell in reach. Plan of care on going.

## 2023-01-28 NOTE — Progress Notes (Signed)
Patient confused this am and repeating herself,patient was redirected and told she was in the hospital due to a fall she had at home.Dr [place] notified. Plan of care on going.

## 2023-01-28 NOTE — Progress Notes (Signed)
PROGRESS NOTE    [name]  [id number] DOB: 09/01/1956 DOA: 01/26/2023 PCP: [name], MD    Brief Narrative:   [name] is a 67 y.o. female with past medical history significant for HTN, HLD, COPD, CAD, history of CVA, history of left breast cancer, polysubstance abuse with EtOH, cocaine who presented to [facility] ED on 3/11 via EMS from home after fall at home with complaints of right hip pain.  Patient denied hitting her head or loss of consciousness.  Endorses Plavix use but denies any other anticoagulant.  In the ED, temperature 97.6 F, HR 57, RR 18, BP 140/70, SpO2 100% on room air.  WBC 15.0, hemoglobin 11.1, platelets 300.  Sodium 132, potassium 3.5, chloride 100, CO2 27, glucose 123, BUN 13, creatinine 0.86.  Magnesium 2.1.  Chest x-ray with no active cardiopulmonary disease process.  Right hip/pelvis x-ray with acute proximal right femur fracture.  CT right hip with comminuted, displaced and angulated intertrochanteric fracture of the right femur.  Orthopedics was consulted.  TR[facility] consulted for admission and further evaluation and management of acute right hip fracture.  Assessment & Plan:   Acute comminuted/displaced/angulated intertrochanteric right femur fracture Patient presenting to ED following fall at home with associated right hip pain and inability to ambulate.  Imaging on admission notable for comminuted, displaced, angulated intertrochanteric fracture of the right femur. -- Orthopedics following, appreciate assistance -- Oxycodone 5 mg p.o. every 4 hours.  Moderate pain -- Dilaudid 0.5 mg IV every 3 hours as needed severe pain -- Robaxin 500 mg IV/p.o. every 6 hours muscle spasms -- Holding Plavix -- N.p.o. after midnight for planned operative management on 3/14 -- PT/OT evaluation postoperatively, anticipate likely need of SNF placement  Vitamin D deficiency Vitamin D 25-hydroxy level low at 19.70. -- Drisdol 50,000 units p.o. q. 7  days  Essential hypertension --Hold home lisinopril/HCTZ, carvedilol, amlodipine for now -- Continue statin -- Holding Plavix as above for plan operative management -- Continue monitor BP closely  Hyperlipidemia -- Atorvastatin 40 mg p.o. daily  COPD -- Albuterol neb every 4 hours.  Wheezing/shortness of breath  CAD Hx CVA -- Continue statin -- Holding Plavix as above  Polysubstance abuse Patient with known history of alcohol, cocaine abuse.  UDS on admission positive for opiates, THC, cocaine.  Counseled on need for complete cessation/abstinence. -- CIWA protocol with symptom triggered Ativan -- Thiamine, folate, multivitamin -- TOC consult for substance abuse resources  Severe protein calorie malnutrition Body mass index is 16.65 kg/m.  As noted with significant muscle wasting, flat depletion noted on physical exam. Nutrition Status: Nutrition Problem: Inadequate oral intake Etiology: lethargy/confusion, acute illness Signs/Symptoms: meal completion < 25% Interventions: Ensure Enlive (each supplement provides 350kcal and 20 grams of protein), Magic cup, MVI -- Dietitian following, encourage increased oral intake, supplementation    DVT prophylaxis: heparin injection 5,000 Units Start: 01/27/23 2200 Place and maintain sequential compression device Start: 01/27/23 0746 SCDs Start: 01/27/23 0708    Code Status: Full Code Family Communication: No family present at bedside this morning  Disposition Plan:  Level of care: Med-Surg Status is: Inpatient Remains inpatient appropriate because: Pending operative management for right femur fracture on 3/14, anticipate likely need of SNF placement thereafter    Consultants:  Orthopedics  Procedures:  None  Antimicrobials:  None   Subjective: Patient seen examined bedside, resting comfortably, lying in bed.  Confused.  Somnolent but will respond to verbal command.  Noted to be borderline hypotensive,  will discontinue  antihypertensives.  Also concern for polysubstance abuse and obtained UDS which was positive for cocaine, THC.  Also reported history of alcohol use disorder.  Placed on CIWA protocol.  Started on IV fluid hydration.  Vitamin D level also noted to be low, starting on replacement.  Plan operative management for right hip fracture tomorrow following Plavix washout per orthopedics.  No other complaints or concerns at this time.  Denies headache, no chest pain, no shortness of breath, no abdominal pain.  No acute events overnight per nursing staff.  Objective: Vitals:   01/28/23 0828 01/28/23 1122 01/28/23 1123 01/28/23 1332  BP: 96/68 (!) 104/58 (!) 104/58 (!) 92/58  Pulse: 66 71 71 84  Resp: '18  18 19  '$ Temp: 98.4 F (36.9 C) (!) 97.5 F (36.4 C) (!) 97.5 F (36.4 C) 98.5 F (36.9 C)  TempSrc: Oral Oral  Oral  SpO2: 100% 100%  100%  Weight:      Height:        Intake/Output Summary (Last 24 hours) at 01/28/2023 1602 Last data filed at 01/28/2023 1525 Gross per 24 hour  Intake 1047.46 ml  Output --  Net 1047.46 ml   Filed Weights   01/26/23 2335  Weight: 44 kg    Examination:  Physical Exam: GEN: NAD, alert, confused, chronically ill/cachectic in appearance, appears older than stated age HEENT: NCAT, PERRL, EOMI, sclera clear, MMM PULM: CTAB w/o wheezes/crackles, normal respiratory effort CV: RRR w/o M/G/R GI: abd soft, NTND, NABS, no R/G/M MSK: no peripheral edema, right lower extremity shortened with external rotation, tenderness to palpation right lateral hip, left lower extremity with normal active/passive range of motion, neurovascularly intact NEURO: CN II-XII intact, no focal deficits, sensation to light touch intact PSYCH: Depressed mood, flat affect Integumentary: No concerning rashes/lesions/wounds noted on exposed skin surfaces    Data Reviewed: I have personally reviewed following labs and imaging studies  CBC: Recent Labs  Lab 01/25/23 0003 01/27/23 0202  01/28/23 0432  WBC 9.6 15.0* 9.0  NEUTROABS  --  12.9*  --   HGB 12.2 11.1* 9.7*  HCT 35.3* 32.6* 28.2*  MCV 85.7 86.0 84.7  PLT 325 300 [id number]   Basic Metabolic Panel: Recent Labs  Lab 01/25/23 0003 01/27/23 0202 01/27/23 0708 01/28/23 0432  NA 133* 132*  --  134*  K 4.0 3.5  --  3.7  CL 98 100  --  100  CO2 28 27  --  26  GLUCOSE 129* 123*  --  110*  BUN 10 13  --  19  CREATININE 0.90 0.86  --  1.09*  CALCIUM 9.6 9.5  --  9.1  MG  --   --  2.1  --   PHOS  --   --  3.6  --    GFR: Estimated Creatinine Clearance: 34.3 mL/min (A) (by C-G formula based on SCr of 1.09 mg/dL (H)). Liver Function Tests: Recent Labs  Lab 01/25/23 0003 01/28/23 0432  AST 24 16  ALT 15 16  ALKPHOS 52 39  BILITOT 0.5 0.6  PROT 8.0 6.5  ALBUMIN 4.2 3.3*   No results for input(s): "LIPASE", "AMYLASE" in the last 168 hours. No results for input(s): "AMMONIA" in the last 168 hours. Coagulation Profile: Recent Labs  Lab 01/27/23 0202  INR 1.1   Cardiac Enzymes: No results for input(s): "CKTOTAL", "CKMB", "CKMBINDEX", "TROPONINI" in the last 168 hours. BNP (last 3 results) No results for input(s): "PROBNP" in the last 8760 hours.  HbA1C: No results for input(s): "HGBA1C" in the last 72 hours. CBG: No results for input(s): "GLUCAP" in the last 168 hours. Lipid Profile: No results for input(s): "CHOL", "HDL", "LDLCALC", "TRIG", "CHOLHDL", "LDLDIRECT" in the last 72 hours. Thyroid Function Tests: No results for input(s): "TSH", "T4TOTAL", "FREET4", "T3FREE", "THYROIDAB" in the last 72 hours. Anemia Panel: No results for input(s): "VITAMINB12", "FOLATE", "FERRITIN", "TIBC", "IRON", "RETICCTPCT" in the last 72 hours. Sepsis Labs: No results for input(s): "PROCALCITON", "LATICACIDVEN" in the last 168 hours.  Recent Results (from the past 240 hour(s))  MRSA Next Gen by PCR, Nasal     Status: None   Collection Time: 01/27/23  5:10 PM   Specimen: Nasal Mucosa; Nasal Swab  Result Value Ref  Range Status   MRSA by PCR Next Gen NOT DETECTED NOT DETECTED Final    Comment: (NOTE) The GeneXpert MRSA Assay (FDA approved for NASAL specimens only), is one component of a comprehensive MRSA colonization surveillance program. It is not intended to diagnose MRSA infection nor to guide or monitor treatment for MRSA infections. Test performance is not FDA approved in patients less than 48 years old. Performed at [facility], [address], [place],  [address]          Radiology Studies: CT HIP RIGHT WO CONTRAST  Result Date: 01/27/2023 CLINICAL DATA:  Right hip pain status post fall. EXAM: CT OF THE RIGHT HIP WITHOUT CONTRAST TECHNIQUE: Multidetector CT imaging of the right hip was performed according to the standard protocol. Multiplanar CT image reconstructions were also generated. RADIATION DOSE REDUCTION: This exam was performed according to the departmental dose-optimization program which includes automated exposure control, adjustment of the mA and/or kV according to patient size and/or use of iterative reconstruction technique. COMPARISON:  Radiographs 01/26/2023 and 04/10/2020. CT pelvis 01/20/2020. FINDINGS: Bones/Joint/Cartilage There is a comminuted, displaced and angulated intertrochanteric fracture of the right femur. This fracture results in moderate varus angulation and apex anterior angulation. The femoral head is located. There are mild underlying right hip degenerative changes and no significant hip joint effusion. No evidence of fracture of the right hemipelvis. Lower lumbar spondylosis noted. Ligaments Suboptimally assessed by CT. Muscles and Tendons No intramuscular fluid collection or focal atrophy identified. Soft tissues 2.6 x 1.6 cm soft tissue hematoma along the posteromedial aspect of the proximal femur fracture. No other focal fluid collections, foreign bodies or soft tissue emphysema. Iliofemoral atherosclerosis noted. IMPRESSION: 1. Comminuted, displaced and  angulated intertrochanteric fracture of the right femur as described. 2. No other acute osseous findings identified. 3. Mild underlying right hip degenerative changes. Electronically Signed   By: Richardean Sale M.D.   On: 01/27/2023 11:48   DG Chest Port 1 View  Result Date: 01/27/2023 CLINICAL DATA:  preop respiratory exam EXAM: PORTABLE CHEST 1 VIEW COMPARISON:  Chest x-ray 01/25/2023 FINDINGS: The heart and mediastinal contours are unchanged. Aortic calcification. No focal consolidation. No pulmonary edema. No pleural effusion. No pneumothorax. No acute osseous abnormality. IMPRESSION: No active disease. Electronically Signed   By: Iven Finn M.D.   On: 01/27/2023 02:17   DG Hip Unilat  With Pelvis 2-3 Views Right  Result Date: 01/27/2023 CLINICAL DATA:  Fall with hip pain EXAM: DG HIP (WITH OR WITHOUT PELVIS) 2-3V RIGHT COMPARISON:  04/10/2020 FINDINGS: Pubic symphysis appears intact. SI joints are non widened. Acute fracture of proximal right femur, appears to involves the basicervical/intertrochanteric region but limited due to positioning. Femoral head projects in joint. IMPRESSION: Acute proximal right femur fracture, appears  to involve the basicervical/intertrochanteric region but limited by positioning. Electronically Signed   By: Donavan Foil M.D.   On: 01/27/2023 00:19        Scheduled Meds:  amLODipine  10 mg Oral Daily   atorvastatin  40 mg Oral Daily   carvedilol  12.5 mg Oral BID   [START ON 01/29/2023] feeding supplement  237 mL Oral BID BM   folic acid  1 mg Oral Daily   heparin injection (subcutaneous)  5,000 Units Subcutaneous Q8H   multivitamin with minerals  1 tablet Oral Daily   polyethylene glycol  17 g Oral BID   thiamine  100 mg Oral Daily   Or   thiamine  100 mg Intravenous Daily   Vitamin D (Ergocalciferol)  50,000 Units Oral Q7 days   Continuous Infusions:  sodium chloride 75 mL/hr at 01/28/23 1214   [START ON 01/29/2023]  ceFAZolin (ANCEF) IV      [START ON 01/29/2023] tranexamic acid       LOS: 1 day    Time spent: 52 minutes spent on chart review, discussion with nursing staff, consultants, updating family and interview/physical exam; more than 50% of that time was spent in counseling and/or coordination of care.    Laysha Childers J [place] DO [facility] Available via Epic secure chat 7am-7pm After these hours, please refer to coverage provider listed on [email or web address] 01/28/2023, 4:02 PM

## 2023-01-28 NOTE — Progress Notes (Signed)
Initial Nutrition Assessment  DOCUMENTATION CODES:  Underweight  INTERVENTION:  Continue regular diet as ordered Ensure Enlive po BID, each supplement provides 350 kcal and 20 grams of protein. Magic cup TID with meals, each supplement provides 290 kcal and 9 grams of protein MVI with minerals daily Recommend Vitamin D repletion; discussed with MD who agrees to order  NUTRITION DIAGNOSIS:  Inadequate oral intake related to lethargy/confusion, acute illness as evidenced by meal completion < 25%.  GOAL:  Patient will meet greater than or equal to 90% of their needs  MONITOR:   PO intake, Supplement acceptance, Labs, Weight trends  REASON FOR ASSESSMENT:   Consult Assessment of nutrition requirement/status  ASSESSMENT:   Pt admitted with R femoral fracture following a fall. PMH significant for HTN, HLD, COPD, CAD, stroke, L breast cancer.   Per Ortho, plans for cephalomedullary nailing tomorrow.   Per nursing documentation, pt noted to be confused. Oriented to name and DOB.   Meal completions: 3/12: 20% dinner 3/13: 0% breakfast  Per review of weight history, there is limited documentation of weight history on file to review within the last year. Noted pt's weight since December 2023 to remain around 43.5 kg. Will continue to follow trends throughout admission.   Given noted poor PO intake and underweight status, there is high suspicion pt has a degree of malnutrition however unable to confirm given limited nutrition related history and inability to perform nutrition focused physical exam.   Medication: folvite, MVI, miralax, thimaine IV drips: NaCl @ 33m/hr  Labs: sodium 134, Cr 1.09, GFR 55, Vitamin D 19.70 (L)  NUTRITION - FOCUSED PHYSICAL EXAM: RD working remotely. Deferred to follow up.   Diet Order:   Diet Order             Diet regular Room service appropriate? Yes; Fluid consistency: Thin  Diet effective now                   EDUCATION NEEDS:    No education needs have been identified at this time  Skin:  Skin Assessment: Reviewed RN Assessment  Last BM:  PTA  Height:   Ht Readings from Last 1 Encounters:  01/26/23 '5\' 4"'$  (1.626 m)    Weight:   Wt Readings from Last 1 Encounters:  01/26/23 44 kg   BMI:  Body mass index is 16.65 kg/m.  Estimated Nutritional Needs:   Kcal:  1300-1500  Protein:  65-80g  Fluid:  1.3-1.5L  AClayborne Dana RDN, LDN Clinical Nutrition

## 2023-01-28 NOTE — Progress Notes (Signed)
Patient is more alert at this time, Normal saline at 75 ml's per hr was started  per MD order.Patient was able to tell me her name and date of birth,confused to time,place,also situation. I was able to get the patient to drink her Nutritional boost at this time.Call bell in reach, plan of care on going.

## 2023-01-28 NOTE — Progress Notes (Signed)
   01/28/23 1122  Vitals  Temp (!) 97.5 F (36.4 C)  Temp Source Oral  BP (!) 104/58  MAP (mmHg) 72  BP Location Left Arm  BP Method Automatic  Patient Position (if appropriate) Lying  Pulse Rate 71  Pulse Rate Source Dinamap  Level of Consciousness  Level of Consciousness Responds to Pain  MEWS COLOR  MEWS Score Color Yellow  Oxygen Therapy  SpO2 100 %  O2 Device Room Air  Pain Assessment  Pain Scale PAINAD  PAINAD (Pain Assessment in Advanced Dementia)  Breathing 0  Negative Vocalization 1  Facial Expression 2  Body Language 2  Consolability 1  PAINAD Score 6  MEWS Score  MEWS Temp 0  MEWS Systolic 0  MEWS Pulse 0  MEWS RR 0  MEWS LOC 2  MEWS Score 2  Provider Notification  Provider Name/Title Dr [place]  Date Provider Notified 01/28/23  Time Provider Notified 1125  Method of Notification Page  Notification Reason Other (Comment) (patient confused,and vital signs.[name])  Provider response See new orders  Date of Provider Response 01/28/23  Time of Provider Response 1127

## 2023-01-28 NOTE — Progress Notes (Addendum)
Patient seem to be going through some type of withdrawal, very restless,agitated. I reach out to the sister to see if their was any type of history of  Alcohol use or substance use,patient's sister said maybe a year ago..Dr [place] notified. Plan of care on going.

## 2023-01-29 ENCOUNTER — Encounter (HOSPITAL_COMMUNITY)
Admission: EM | Disposition: A | Discharge status: Home / Self Care | Payer: Self-pay | Source: Home / Self Care | Attending: Internal Medicine

## 2023-01-29 ENCOUNTER — Inpatient Hospital Stay (HOSPITAL_COMMUNITY): Payer: Medicare Other

## 2023-01-29 ENCOUNTER — Inpatient Hospital Stay (HOSPITAL_COMMUNITY): Payer: Medicare Other | Admitting: Certified Registered Nurse Anesthetist

## 2023-01-29 ENCOUNTER — Other Ambulatory Visit: Payer: Self-pay

## 2023-01-29 ENCOUNTER — Encounter (HOSPITAL_COMMUNITY): Payer: Self-pay | Admitting: Internal Medicine

## 2023-01-29 DIAGNOSIS — I252 Old myocardial infarction: Secondary | ICD-10-CM | POA: Diagnosis not present

## 2023-01-29 DIAGNOSIS — I2510 Atherosclerotic heart disease of native coronary artery without angina pectoris: Secondary | ICD-10-CM | POA: Diagnosis not present

## 2023-01-29 DIAGNOSIS — S72141A Displaced intertrochanteric fracture of right femur, initial encounter for closed fracture: Secondary | ICD-10-CM | POA: Diagnosis not present

## 2023-01-29 DIAGNOSIS — F17210 Nicotine dependence, cigarettes, uncomplicated: Secondary | ICD-10-CM

## 2023-01-29 DIAGNOSIS — I10 Essential (primary) hypertension: Secondary | ICD-10-CM

## 2023-01-29 DIAGNOSIS — E871 Hypo-osmolality and hyponatremia: Secondary | ICD-10-CM | POA: Diagnosis not present

## 2023-01-29 DIAGNOSIS — S72001A Fracture of unspecified part of neck of right femur, initial encounter for closed fracture: Secondary | ICD-10-CM | POA: Diagnosis not present

## 2023-01-29 HISTORY — PX: INTRAMEDULLARY (IM) NAIL INTERTROCHANTERIC: SHX5875

## 2023-01-29 LAB — BASIC METABOLIC PANEL
Anion gap: 11 (ref 5–15)
BUN: 44 mg/dL — ABNORMAL HIGH (ref 8–23)
CO2: 21 mmol/L — ABNORMAL LOW (ref 22–32)
Calcium: 8.9 mg/dL (ref 8.9–10.3)
Chloride: 105 mmol/L (ref 98–111)
Creatinine, Ser: 0.93 mg/dL (ref 0.44–1.00)
GFR, Estimated: >60 mL/min (ref >60)
Glucose, Bld: 101 mg/dL — ABNORMAL HIGH (ref 70–99)
Potassium: 3.9 mmol/L (ref 3.5–5.1)
Sodium: 137 mmol/L (ref 135–145)

## 2023-01-29 LAB — RETICULOCYTES
Immature Retic Fract: 24.9 % — ABNORMAL HIGH (ref 2.3–15.9)
RBC.: 2.44 MIL/uL — ABNORMAL LOW (ref 3.87–5.11)
Retic Count, Absolute: 31 10*3/uL (ref 19.0–186.0)
Retic Ct Pct: 1.3 % (ref 0.4–3.1)

## 2023-01-29 LAB — IRON AND TIBC
Iron: 22 ug/dL — ABNORMAL LOW (ref 28–170)
Saturation Ratios: 10 % — ABNORMAL LOW (ref 10.4–31.8)
TIBC: 232 ug/dL — ABNORMAL LOW (ref 250–450)
UIBC: 210 ug/dL

## 2023-01-29 LAB — CBC
HCT: 22 % — ABNORMAL LOW (ref 36.0–46.0)
Hemoglobin: 7.4 g/dL — ABNORMAL LOW (ref 12.0–15.0)
MCH: 29.2 pg (ref 26.0–34.0)
MCHC: 33.6 g/dL (ref 30.0–36.0)
MCV: 87 fL (ref 80.0–100.0)
Platelets: 258 10*3/uL (ref 150–400)
RBC: 2.53 MIL/uL — ABNORMAL LOW (ref 3.87–5.11)
RDW: 14.7 % (ref 11.5–15.5)
WBC: 11.9 10*3/uL — ABNORMAL HIGH (ref 4.0–10.5)
nRBC: 0 % (ref 0.0–0.2)

## 2023-01-29 LAB — MAGNESIUM: Magnesium: 2.2 mg/dL (ref 1.7–2.4)

## 2023-01-29 LAB — FERRITIN: Ferritin: 187 ng/mL (ref 11–307)

## 2023-01-29 LAB — VITAMIN B12: Vitamin B-12: 560 pg/mL (ref 180–914)

## 2023-01-29 LAB — FOLATE: Folate: 8.4 ng/mL (ref >5.9)

## 2023-01-29 SURGERY — FIXATION, FRACTURE, INTERTROCHANTERIC, WITH INTRAMEDULLARY ROD
Anesthesia: General | Site: Hip | Laterality: Right

## 2023-01-29 MED ORDER — LACTATED RINGERS IV SOLN: INTRAVENOUS | Status: DC

## 2023-01-29 MED ORDER — ORAL CARE MOUTH RINSE: 15.0000 mL | Freq: Once | OROMUCOSAL | Status: AC

## 2023-01-29 MED ORDER — ONDANSETRON HCL 4 MG/2ML IJ SOLN: 4.0000 mg | Freq: Once | INTRAMUSCULAR | Status: DC | PRN

## 2023-01-29 MED ORDER — SODIUM CHLORIDE 0.9 % IR SOLN
Status: DC | PRN
  Administered 2023-01-29: 1000 mL

## 2023-01-29 MED ORDER — PROPOFOL 10 MG/ML IV BOLUS
INTRAVENOUS | Status: DC | PRN
  Administered 2023-01-29: 80 mg via INTRAVENOUS

## 2023-01-29 MED ORDER — DEXAMETHASONE SODIUM PHOSPHATE 10 MG/ML IJ SOLN
INTRAMUSCULAR | Status: DC | PRN
  Administered 2023-01-29: 10 mg via INTRAVENOUS

## 2023-01-29 MED ORDER — PHENYLEPHRINE 80 MCG/ML (10ML) SYRINGE FOR IV PUSH (FOR BLOOD PRESSURE SUPPORT)
PREFILLED_SYRINGE | INTRAVENOUS | Status: DC | PRN
  Administered 2023-01-29 (×2): 160 ug via INTRAVENOUS
  Administered 2023-01-29: 220 ug via INTRAVENOUS
  Administered 2023-01-29: 160 ug via INTRAVENOUS

## 2023-01-29 MED ORDER — CEFAZOLIN SODIUM-DEXTROSE 2-4 GM/100ML-% IV SOLN
2.0000 g | Freq: Three times a day (TID) | INTRAVENOUS | Status: AC
  Administered 2023-01-29 – 2023-01-30 (×3): 2 g via INTRAVENOUS
  Filled 2023-01-29 (×3): qty 100

## 2023-01-29 MED ORDER — OXYCODONE HCL 5 MG/5ML PO SOLN: 5.0000 mg | Freq: Once | ORAL | Status: DC | PRN

## 2023-01-29 MED ORDER — OXYCODONE HCL 5 MG PO TABS: 5.0000 mg | ORAL_TABLET | Freq: Once | ORAL | Status: DC | PRN

## 2023-01-29 MED ORDER — HYDROMORPHONE HCL 1 MG/ML IJ SOLN: 0.2500 mg | INTRAMUSCULAR | Status: DC | PRN

## 2023-01-29 MED ORDER — PHENYLEPHRINE HCL-NACL 20-0.9 MG/250ML-% IV SOLN
INTRAVENOUS | Status: DC | PRN
  Administered 2023-01-29: 100 ug/min via INTRAVENOUS

## 2023-01-29 MED ORDER — BUPIVACAINE HCL (PF) 0.5 % IJ SOLN
INTRAMUSCULAR | Status: AC
  Filled 2023-01-29: qty 30

## 2023-01-29 MED ORDER — ONDANSETRON HCL 4 MG/2ML IJ SOLN
INTRAMUSCULAR | Status: DC | PRN
  Administered 2023-01-29: 4 mg via INTRAVENOUS

## 2023-01-29 MED ORDER — FENTANYL CITRATE (PF) 100 MCG/2ML IJ SOLN
INTRAMUSCULAR | Status: AC
  Filled 2023-01-29: qty 2

## 2023-01-29 MED ORDER — LIDOCAINE 2% (20 MG/ML) 5 ML SYRINGE
INTRAMUSCULAR | Status: DC | PRN
  Administered 2023-01-29: 40 mg via INTRAVENOUS

## 2023-01-29 MED ORDER — BUPIVACAINE HCL (PF) 0.5 % IJ SOLN
INTRAMUSCULAR | Status: DC | PRN
  Administered 2023-01-29: 30 mL

## 2023-01-29 MED ORDER — ONDANSETRON HCL 4 MG/2ML IJ SOLN
INTRAMUSCULAR | Status: AC
  Filled 2023-01-29: qty 4

## 2023-01-29 MED ORDER — PHENYLEPHRINE HCL (PRESSORS) 10 MG/ML IV SOLN
INTRAVENOUS | Status: AC
  Filled 2023-01-29: qty 1

## 2023-01-29 MED ORDER — LIDOCAINE HCL (PF) 2 % IJ SOLN
INTRAMUSCULAR | Status: AC
  Filled 2023-01-29: qty 10

## 2023-01-29 MED ORDER — TRANEXAMIC ACID-NACL 1000-0.7 MG/100ML-% IV SOLN
INTRAVENOUS | Status: AC
  Filled 2023-01-29: qty 100

## 2023-01-29 MED ORDER — FENTANYL CITRATE (PF) 100 MCG/2ML IJ SOLN
INTRAMUSCULAR | Status: DC | PRN
  Administered 2023-01-29 (×2): 50 ug via INTRAVENOUS

## 2023-01-29 MED ORDER — SUGAMMADEX SODIUM 200 MG/2ML IV SOLN
INTRAVENOUS | Status: DC | PRN
  Administered 2023-01-29: 200 mg via INTRAVENOUS

## 2023-01-29 MED ORDER — CHLORHEXIDINE GLUCONATE 0.12 % MT SOLN
15.0000 mL | Freq: Once | OROMUCOSAL | Status: AC
  Administered 2023-01-29: 15 mL via OROMUCOSAL

## 2023-01-29 MED ORDER — ROCURONIUM BROMIDE 10 MG/ML (PF) SYRINGE
PREFILLED_SYRINGE | INTRAVENOUS | Status: DC | PRN
  Administered 2023-01-29: 50 mg via INTRAVENOUS

## 2023-01-29 SURGICAL SUPPLY — 55 items
APL PRP STRL LF DISP 70% ISPRP (MISCELLANEOUS) ×1
BIT DRILL 4.0X280 (BIT) IMPLANT
BLADE SURG SZ10 CARB STEEL (BLADE) ×1 IMPLANT
BNDG GAUZE ELAST 4 BULKY (GAUZE/BANDAGES/DRESSINGS) ×1 IMPLANT
BRUSH SCRUB EZ W/ULTRADEX 3%PC (MISCELLANEOUS) IMPLANT
CHLORAPREP W/TINT 26 (MISCELLANEOUS) ×1 IMPLANT
CLOTH BEACON ORANGE TIMEOUT ST (SAFETY) ×1 IMPLANT
COVER LIGHT HANDLE  1/PK (MISCELLANEOUS) ×2
COVER LIGHT HANDLE 1/PK (MISCELLANEOUS) IMPLANT
COVER MAYO STAND XLG (MISCELLANEOUS) ×1 IMPLANT
COVER PERINEAL POST (MISCELLANEOUS) ×1 IMPLANT
DRAPE STERI IOBAN 125X83 (DRAPES) ×1 IMPLANT
DRSG MEPILEX SACRM 8.7X9.8 (GAUZE/BANDAGES/DRESSINGS) ×1 IMPLANT
DRSG PAD ABDOMINAL 8X10 ST (GAUZE/BANDAGES/DRESSINGS) ×1 IMPLANT
DRSG TEGADERM 4X4.75 (GAUZE/BANDAGES/DRESSINGS) ×4 IMPLANT
ELECT REM PT RETURN 9FT ADLT (ELECTROSURGICAL) ×1
ELECTRODE REM PT RTRN 9FT ADLT (ELECTROSURGICAL) ×1 IMPLANT
GAUZE SPONGE 4X4 12PLY STRL (GAUZE/BANDAGES/DRESSINGS) ×1 IMPLANT
GLOVE BIO SURGEON STRL SZ8 (GLOVE) ×2 IMPLANT
GLOVE BIOGEL PI IND STRL 7.0 (GLOVE) ×2 IMPLANT
GLOVE SRG 8 PF TXTR STRL LF DI (GLOVE) ×1 IMPLANT
GLOVE SURG UNDER POLY LF SZ8 (GLOVE) ×1
GOWN STRL REUS W/ TWL XL LVL3 (GOWN DISPOSABLE) ×1 IMPLANT
GOWN STRL REUS W/TWL LRG LVL3 (GOWN DISPOSABLE) ×1 IMPLANT
GOWN STRL REUS W/TWL XL LVL3 (GOWN DISPOSABLE) ×1
GUIDEWIRE BALL NOSE 3.0X900 (WIRE) ×1
GUIDEWIRE ORTH 900X3XBALL NOSE (WIRE) IMPLANT
INST SET MAJOR BONE (KITS) ×1 IMPLANT
KIT BLADEGUARD II DBL (SET/KITS/TRAYS/PACK) ×1 IMPLANT
KIT TURNOVER CYSTO (KITS) ×1 IMPLANT
MANIFOLD NEPTUNE II (INSTRUMENTS) ×1 IMPLANT
MARKER SKIN DUAL TIP RULER LAB (MISCELLANEOUS) ×1 IMPLANT
NAIL ES TROCH RT 10X125X39 (Nail) IMPLANT
NDL HYPO 21X1.5 SAFETY (NEEDLE) ×1 IMPLANT
NEEDLE HYPO 21X1.5 SAFETY (NEEDLE) ×1 IMPLANT
NS IRRIG 1000ML POUR BTL (IV SOLUTION) ×1 IMPLANT
PACK BASIC III (CUSTOM PROCEDURE TRAY) ×1
PACK SRG BSC III STRL LF ECLPS (CUSTOM PROCEDURE TRAY) ×1 IMPLANT
PAD ARMBOARD 7.5X6 YLW CONV (MISCELLANEOUS) ×1 IMPLANT
PENCIL HANDSWITCHING (ELECTRODE) IMPLANT
PIN GUIDE THRD AR 3.2X330 (PIN) IMPLANT
SCREW LOCK CORT 5X34 (Screw) IMPLANT
SCREW LOCK LAG 10.5X85 GALILEO (Screw) IMPLANT
SET BASIN LINEN APH (SET/KITS/TRAYS/PACK) ×1 IMPLANT
SPONGE T-LAP 18X18 ~~LOC~~+RFID (SPONGE) ×2 IMPLANT
STRIP CLOSURE SKIN 1/2X4 (GAUZE/BANDAGES/DRESSINGS) IMPLANT
SUT MNCRL AB 4-0 PS2 18 (SUTURE) IMPLANT
SUT MON AB 2-0 CT1 36 (SUTURE) ×1 IMPLANT
SUT VIC AB 0 CT1 27 (SUTURE) ×1
SUT VIC AB 0 CT1 27XBRD ANTBC (SUTURE) ×1 IMPLANT
SYR 30ML LL (SYRINGE) ×1 IMPLANT
SYR BULB IRRIG 60ML STRL (SYRINGE) ×2 IMPLANT
TOOL ACTIVATION (INSTRUMENTS) IMPLANT
TRAY FOLEY MTR SLVR 16FR STAT (SET/KITS/TRAYS/PACK) ×1 IMPLANT
YANKAUER SUCT BULB TIP 10FT TU (MISCELLANEOUS) ×1 IMPLANT

## 2023-01-29 NOTE — Op Note (Signed)
Orthopaedic Surgery Operative Note (CSN: [id number])  [name]  [birth year] Date of Surgery: 01/29/2023   Diagnoses:  Right intertrochanteric femur fracture  Procedure: Cephalomedullary nail for Right intertrochanteric femur fracture   Operative Finding Successful completion of the planned procedure.  Placement of 10 mm x 390 mm, 125 degree cephalomedullary nail without issues.    Post-Op Diagnosis: Same Surgeons:Primary: Mordecai Rasmussen, MD Assistants:  None Location: [facility] OR ROOM 1 Anesthesia: General with local anesthesia Antibiotics: Ancef 2 g Tourniquet time: N/A Estimated Blood Loss: [id number] cc Complications: None Specimens: None Implants: Implant Name Type Inv. Item Serial No. Manufacturer Lot No. LRB No. Used Action  SCREW LOCK CORT 5X34 - [id number] Screw SCREW LOCK CORT 5X34  [place] STERILE ON SET Right 1 Implanted  SCREW LOCK LAG 10.5X85 GALILEO - [id number] Screw SCREW LOCK LAG 10.5X85 GALILEO  [employer] STERILE ON SET Right 1 Implanted  ES [facility] NAIL [id number]    [name] STERILE ON SET Right 1 Implanted    Indications for Surgery:   [name] is a 67 y.o. female who had a mechanical fall and sustained a Right intertrochanteric femur fracture.  I recommended operative fixation to restore stability and allow the patient to ambulate immediately postop.  Benefits and risks of operative and nonoperative management were discussed prior to surgery with patient and her guardian and informed consent form was completed.  Specific risks including infection, need for additional surgery, persistent pain, bleeding, malunion, nonunion and more severe complications associated with anesthesia were discussed.  The patient's guardian elected to proceed and surgical consent was obtained.    Procedure:   The patient was identified properly. Informed consent was obtained and the surgical site was marked. The patient was taken to the OR where general anesthesia was induced.   The  patient was placed supine on a fracture table and appropriate reduction was obtained and visualized on fluoroscopy prior to the beginning of the procedure.  Timeout was performed before the beginning of the case.  Ancef 2 g dosing was confirmed prior to making incision.  The patient received TXA prior to the start of surgery.   We made an incision proximal to the greater trochanter and dissected down through the fascia.  We then carefully placed a guidepin, localizing under fluoroscopy.  Once satisfied with the starting point, the entry reamer was used to gain entry into the intramedullary canal.  A ball tip guidewire was then introduced and passed to an appropriate level at the physeal scar of the distal femur and measurement was obtained proximally using fluoroscopy.  We selected the appropriate length of nail, as noted above.  Entry reamer was used needed but the nail was unreamed.  At this point we placed our nail localizing under fluoroscopy, and confirmed that it was at the appropriate level.  Next we used the outrigger device to pass a wire into the femoral neck, and then the cephalomedullary lag screw.  The screw was locked proximally to avoid over collapse.  We then turned our attention to the distal interlocking screw.  Once again, we used the outrigger device to place a single interlocking screw in the midshaft area.  The outrigger device was removed and final fluoroscopic images were obtained.  The wounds were thoroughly irrigated closed in a multilayer fashion with 0 vicryl, 2-0 monocryl and 4-0 monocryl.  Sterile dressings were placed.  The patient was awoken from general anesthesia and taken to the PACU in stable condition without complication.  Post-operative plan:  Weightbearing: The patient will be WBAT on the operative extremity.   DVT prophylaxis per primary team, no orthopedic contraindications.  Resume Plavix POD#1 Pain control with PRN pain medication preferring oral medicines.    Dressing can be reinforced as needed, will change on POD#2/3 if needed.  Patient does not need to remain hospitalized for dressing change Follow up plan: approximately 2 weeks postop for incision check and XR.  If the patient will be returning to a nursing facility, sutures can be trimmed around this time and a follow up appointment can be scheduled for 6 weeks after surgery. XR at next visit:  please obtain AP pelvis, and 2 views of the Right femur

## 2023-01-29 NOTE — Progress Notes (Signed)
PROGRESS NOTE    [name]  [id number] DOB: 11/18/1955 DOA: 01/26/2023 PCP: [name], MD    Brief Narrative:   [name] is a 67 y.o. female with past medical history significant for HTN, HLD, COPD, CAD, history of CVA, history of left breast cancer, polysubstance abuse with EtOH, cocaine who presented [facility] ED on 3/11 via EMS from home after fall at home with complaints of right hip pain.  Patient denied hitting her head or loss of consciousness.  Endorses Plavix use but denies any other anticoagulant.  In the ED, temperature 97.6 F, HR 57, RR 18, BP 140/70, SpO2 100% on room air.  WBC 15.0, hemoglobin 11.1, platelets 300.  Sodium 132, potassium 3.5, chloride 100, CO2 27, glucose 123, BUN 13, creatinine 0.86.  Magnesium 2.1.  Chest x-ray with no active cardiopulmonary disease process.  Right hip/pelvis x-ray with acute proximal right femur fracture.  CT right hip with comminuted, displaced and angulated intertrochanteric fracture of the right femur.  Orthopedics was consulted.  TRH consulted for admission and further evaluation and management of acute right hip fracture.  Assessment & Plan:   Acute comminuted/displaced/angulated intertrochanteric right femur fracture Patient presenting to ED following fall at home with associated right hip pain and inability to ambulate.  Imaging on admission notable for comminuted, displaced, angulated intertrochanteric fracture of the right femur.  Orthopedics was consulted and patient underwent cephalic medullary nailing by Dr. Amedeo Kinsman on 01/29/2023. -- Orthopedics following, appreciate assistance -- Oxycodone 5 mg p.o. every 4 hours for moderate pain -- Dilaudid 0.5 mg IV every 3 hours as needed severe pain -- Robaxin 500 mg IV/p.o. every 6 hours muscle spasms -- Holding Plavix; restart POD #1 per orthopedics -- PT/OT evaluation postoperatively, anticipate likely need of SNF placement; but likely will be difficult given active  cocaine use  Acute blood loss anemia Hemoglobin 11.1 on admission, has trended down to 7.4 today.  Likely in the setting of long bone fracture. -- Hgb 11.1>9.7>7.4 -- Check anemia panel -- Repeat CBC in a.m. -- Transfuse for hemoglobin less than 7.0  Vitamin D deficiency Vitamin D 25-hydroxy level low at 19.70. -- Drisdol 50,000 units p.o. q. 7 days  Essential hypertension -- Hold home lisinopril/HCTZ, carvedilol, amlodipine for now -- Continue statin -- Holding Plavix as above -- Continue monitor BP closely  Hyperlipidemia -- Atorvastatin 40 mg p.o. daily  COPD -- Albuterol neb every 4 hours.  Wheezing/shortness of breath  CAD Hx CVA -- Continue statin -- Holding Plavix as above  Polysubstance abuse Patient with known history of alcohol, cocaine abuse.  UDS on admission positive for opiates, THC, cocaine.  Counseled on need for complete cessation/abstinence. -- CIWA protocol with symptom triggered Ativan -- Thiamine, folate, multivitamin -- TOC consult for substance abuse resources  Severe protein calorie malnutrition Body mass index is 16.65 kg/m.  As noted with significant muscle wasting, flat depletion noted on physical exam. Nutrition Status: Nutrition Problem: Inadequate oral intake Etiology: lethargy/confusion, acute illness Signs/Symptoms: meal completion < 25% Interventions: Ensure Enlive (each supplement provides 350kcal and 20 grams of protein), Magic cup, MVI -- Dietitian following, encourage increased oral intake, supplementation    DVT prophylaxis: Place and maintain sequential compression device Start: 01/27/23 0746 SCDs Start: 01/27/23 0708    Code Status: Full Code Family Communication: No family present at bedside this morning  Disposition Plan:  Level of care: Med-Surg Status is: Inpatient Remains inpatient appropriate because: Underwent operative management for hip fracture  today, will need PT/OT evaluation, hopeful for discharge home with  home health as will be likely difficult to place in SNF given her active cocaine use    Consultants:  Orthopedics  Procedures:  Cephalomedullary nail right intertrochanteric femur fracture, Dr. Amedeo Kinsman; 01/29/2023  Antimicrobials:  Perioperative cefazolin   Subjective: Patient seen examined bedside, resting comfortably, lying in bed.  RN present at bedside.  Just returned from PACU after undergoing right cephalic medullary nail for her underlying right hip fracture.  Reports some pain to operative site.  Discussed need for complete cessation/abstinence from cocaine.  Discussed with TOC this morning, will likely be a difficult placement given her active cocaine use.   No other complaints or concerns at this time.  Denies headache, no chest pain, no shortness of breath, no abdominal pain.  No acute events overnight per nursing staff.  Objective: Vitals:   01/29/23 1000 01/29/23 1015 01/29/23 1022 01/29/23 1130  BP: 97/84 132/64  128/85  Pulse: 81 69  97  Resp: '16 13 13 14  '$ Temp:    98.4 F (36.9 C)  TempSrc:    Oral  SpO2: 100% 95%  100%  Weight:      Height:        Intake/Output Summary (Last 24 hours) at 01/29/2023 1150 Last data filed at 01/29/2023 0945 Gross per 24 hour  Intake 1753.46 ml  Output 1050 ml  Net 703.46 ml   Filed Weights   01/26/23 2335  Weight: 44 kg    Examination:  Physical Exam: GEN: NAD, alert, chronically ill/cachectic in appearance, appears older than stated age HEENT: NCAT, PERRL, EOMI, sclera clear, MMM PULM: CTAB w/o wheezes/crackles, normal respiratory effort, on room air CV: RRR w/o M/G/R GI: abd soft, NTND, NABS, no R/G/M MSK: no peripheral edema, right hip with surgical dressing in place, clean/dry/intact, no concerning areas of fluctuance/erythema, ecchymosis. Neurovascularly intact NEURO: CN II-XII intact, no focal deficits, sensation to light touch intact PSYCH: Depressed mood, flat affect Integumentary: Right hip operative site as  above, otherwise no concerning rashes/lesions/wounds noted on exposed skin surfaces    Data Reviewed: I have personally reviewed following labs and imaging studies  CBC: Recent Labs  Lab 01/25/23 0003 01/27/23 0202 01/28/23 0432 01/29/23 0440  WBC 9.6 15.0* 9.0 11.9*  NEUTROABS  --  12.9*  --   --   HGB 12.2 11.1* 9.7* 7.4*  HCT 35.3* 32.6* 28.2* 22.0*  MCV 85.7 86.0 84.7 87.0  PLT 325 300 270 0000000   Basic Metabolic Panel: Recent Labs  Lab 01/25/23 0003 01/27/23 0202 01/27/23 0708 01/28/23 0432 01/29/23 0440  NA 133* 132*  --  134* 137  K 4.0 3.5  --  3.7 3.9  CL 98 100  --  100 105  CO2 28 27  --  26 21*  GLUCOSE 129* 123*  --  110* 101*  BUN 10 13  --  19 44*  CREATININE 0.90 0.86  --  1.09* 0.93  CALCIUM 9.6 9.5  --  9.1 8.9  MG  --   --  2.1  --  2.2  PHOS  --   --  3.6  --   --    GFR: Estimated Creatinine Clearance: 40.2 mL/min (by C-G formula based on SCr of 0.93 mg/dL). Liver Function Tests: Recent Labs  Lab 01/25/23 0003 01/28/23 0432  AST 24 16  ALT 15 16  ALKPHOS 52 39  BILITOT 0.5 0.6  PROT 8.0 6.5  ALBUMIN 4.2 3.3*  No results for input(s): "LIPASE", "AMYLASE" in the last 168 hours. No results for input(s): "AMMONIA" in the last 168 hours. Coagulation Profile: Recent Labs  Lab 01/27/23 0202  INR 1.1   Cardiac Enzymes: No results for input(s): "CKTOTAL", "CKMB", "CKMBINDEX", "TROPONINI" in the last 168 hours. BNP (last 3 results) No results for input(s): "PROBNP" in the last 8760 hours. HbA1C: No results for input(s): "HGBA1C" in the last 72 hours. CBG: No results for input(s): "GLUCAP" in the last 168 hours. Lipid Profile: No results for input(s): "CHOL", "HDL", "LDLCALC", "TRIG", "CHOLHDL", "LDLDIRECT" in the last 72 hours. Thyroid Function Tests: No results for input(s): "TSH", "T4TOTAL", "FREET4", "T3FREE", "THYROIDAB" in the last 72 hours. Anemia Panel: No results for input(s): "VITAMINB12", "FOLATE", "FERRITIN", "TIBC",  "IRON", "RETICCTPCT" in the last 72 hours. Sepsis Labs: No results for input(s): "PROCALCITON", "LATICACIDVEN" in the last 168 hours.  Recent Results (from the past 240 hour(s))  MRSA Next Gen by PCR, Nasal     Status: None   Collection Time: 01/27/23  5:10 PM   Specimen: Nasal Mucosa; Nasal Swab  Result Value Ref Range Status   MRSA by PCR Next Gen NOT DETECTED NOT DETECTED Final    Comment: (NOTE) The GeneXpert MRSA Assay (FDA approved for NASAL specimens only), is one component of a comprehensive MRSA colonization surveillance program. It is not intended to diagnose MRSA infection nor to guide or monitor treatment for MRSA infections. Test performance is not FDA approved in patients less than 82 years old. Performed at [facility], [address], [place], [place] [address]          Radiology Studies: DG Pelvis Portable  Result Date: 01/29/2023 CLINICAL DATA:  Postop EXAM: PORTABLE PELVIS 1-2 VIEWS COMPARISON:  None Available. FINDINGS: Interval ORIF of the right femur. No evidence of immediate hardware complications. Expected postoperative soft tissue gas. Vascular calcifications. No other fractures are visualized. Assessment of the sacrum is limited due to overlying bowel gas. IMPRESSION: Expected postsurgical changes from recent ORIF of right femur fracture. Electronically Signed   By: Marin Roberts M.D.   On: 01/29/2023 10:14   DG FEMUR PORT, MIN 2 VIEWS RIGHT  Result Date: 01/29/2023 CLINICAL DATA:  Postoperative right intramedullary nail EXAM: RIGHT FEMUR PORTABLE 2 VIEW COMPARISON:  01/29/2023 FINDINGS: Status post intramedullary nail fixation of intratrochanteric fractures of the right femur with expected overlying postoperative change. No evidence of perihardware fracture or component malpositioning. IMPRESSION: Status post intramedullary nail fixation of intratrochanteric fractures of the right femur with expected overlying postoperative change. No evidence of perihardware  fracture or component malpositioning. Electronically Signed   By: Delanna Ahmadi M.D.   On: 01/29/2023 10:09   DG FEMUR, MIN 2 VIEWS RIGHT  Result Date: 01/29/2023 CLINICAL DATA:  Fracture right femur, fluoroscopic assistance for internal fixation EXAM: RIGHT FEMUR 2 VIEWS COMPARISON:  01/26/2023 FINDINGS: Fluoroscopic images show reduction and internal fixation of intertrochanteric fracture of right femur with intramedullary rod. Fluoroscopic time was 63 seconds. Radiation dose 6.81 mGy. IMPRESSION: Fluoroscopic assistance was provided for internal fixation of intertrochanteric fracture of proximal right femur. Electronically Signed   By: Elmer Picker M.D.   On: 01/29/2023 09:58   DG C-Arm 1-60 Min-No Report  Result Date: 01/29/2023 Fluoroscopy was utilized by the requesting physician.  No radiographic interpretation.        Scheduled Meds:  atorvastatin  40 mg Oral Daily   feeding supplement  237 mL Oral BID BM   folic acid  1 mg Oral  Daily   multivitamin with minerals  1 tablet Oral Daily   polyethylene glycol  17 g Oral BID   thiamine  100 mg Oral Daily   Or   thiamine  100 mg Intravenous Daily   Vitamin D (Ergocalciferol)  50,000 Units Oral Q7 days   Continuous Infusions:  sodium chloride 75 mL/hr at 01/28/23 1214     LOS: 2 days    Time spent: 48 minutes spent on chart review, discussion with nursing staff, consultants, updating family and interview/physical exam; more than 50% of that time was spent in counseling and/or coordination of care.    Any Mcneice J [place] DO [facility] Available via Epic secure chat 7am-7pm After these hours, please refer to coverage provider listed on [email or web address] 01/29/2023, 11:50 AM

## 2023-01-29 NOTE — TOC Initial Note (Addendum)
Transition of Care [facility]) - Initial/Assessment Note    Patient Details  Name: [name] MRN: [id number] Date of Birth: 1956/08/29  [facility] CM/SW Contact:    Ihor Gully, LCSW Phone Number: 01/29/2023, 12:43 PM  Clinical Narrative:                 Patient from home with friends. At baseline she uses a cane and walker and is independent. Patient states that she has a Education officer, museum who takes her to appointments. Discussed PT recommendation of SNF. Patient declines to go to a rehab facility. She states that her family and friends will support her. She is agreeable to [facility] services. Sister, Mardene Celeste, confirms that family and friends will assist patient and that on of her friends whom she lives with is in healthcare.  Referral made to [facility].  SA resources added to d/c instructions.  Expected Discharge Plan: [place] Barriers to Discharge: Continued Medical Work up   Patient Goals and CMS Choice Patient states their goals for this hospitalization and ongoing recovery are:: home with [facility]          Expected Discharge Plan and Services       Living arrangements for the past 2 months: Single Family Home                           HH Arranged: RN, PT, Nurse's Aide [place] Agency: [place] ([facility]) Date HH Agency Contacted: 01/29/23 Time [place]: 69 Representative spoke with at [place]: [place] Arrangements/Services Living arrangements for the past 2 months: [place] Lives with:: Friends Patient language and need for interpreter reviewed:: Yes Do you feel safe going back to the place where you live?: Yes      Need for Family Participation in Patient Care: Yes (Comment) Care giver support system in place?: Yes (comment) Current home services: DME (cane, walker) Criminal Activity/Legal Involvement Pertinent to Current Situation/Hospitalization: No - Comment as needed  Activities of  Daily Living Home Assistive Devices/Equipment: Walker (specify type), Grab bars in shower, Grab bars around toilet, Eyeglasses ADL Screening (condition at time of admission) Patient's cognitive ability adequate to safely complete daily activities?: Yes Is the patient deaf or have difficulty hearing?: No Does the patient have difficulty seeing, even when wearing glasses/contacts?: No Does the patient have difficulty concentrating, remembering, or making decisions?: No Patient able to express need for assistance with ADLs?: Yes Does the patient have difficulty dressing or bathing?: No Independently performs ADLs?: Yes (appropriate for developmental age) Does the patient have difficulty walking or climbing stairs?: No Weakness of Legs: Right Weakness of Arms/Hands: None  Permission Sought/Granted Permission sought to share information with : Family Supports    Share Information with NAME: sister, Alberta Schaffert           Emotional Assessment     Affect (typically observed): Appropriate Orientation: : Oriented to Self, Oriented to Place, Oriented to  Time, Oriented to Situation Alcohol / Substance Use: Not Applicable Psych Involvement: No (comment)  Admission diagnosis:  Hyponatremia [E87.1] Normochromic normocytic anemia [D64.9] Right femoral fracture (HCC) [S72.91XA] Elevated random blood glucose level [R73.09] Closed fracture of neck of right femur, initial encounter (HCC) [S72.001A] Elevated blood pressure reading with diagnosis of hypertension [I10] Patient Active Problem List   Diagnosis Date Noted   Right femoral fracture ([place]) 01/27/2023   Atypical chest pain 10/18/2022  Elevated troponin 10/18/2022   Failure to thrive in adult 10/18/2022   Underweight 10/18/2022   COPD (chronic obstructive pulmonary disease) ([place]) 10/18/2022   History of stroke 10/18/2022   Bradycardia    Marijuana abuse    Hypokalemia 10/23/2018   Chronic nausea 05/31/2018   Malignant neoplasm  of overlapping sites of left breast in female, estrogen receptor positive ([place]) 08/19/2017   Ductal carcinoma in situ (DCIS) of left breast 08/04/2017   Liver fibrosis 11/20/2015   Chronic hepatitis C without hepatic coma ([place]) 07/25/2014   Back pain 05/30/2014   Flank pain 12/02/2012   Hip pain, left 05/27/2012   Vision changes 04/08/2012   Healthcare maintenance 12/15/2011   Mixed hyperlipidemia 12/13/2011   ADJUSTMENT DISORDER WITH DEPRESSED MOOD 01/11/2009   MILD COGNITIVE IMPAIRMENT SO STATED 07/10/2008   CAD (coronary artery disease) 07/10/2008   TOBACCO DEPENDENCE 01/14/2007   Essential hypertension 01/14/2007   CVA 01/14/2007   CONVULSIONS, SEIZURES, NOS 01/14/2007   PCP:  [name], MD Pharmacy:   [facility], [address] [place] [address] Phone: [phone] Fax: [phone]     Social Determinants of Health (SDOH) Social History: SDOH Screenings   Food Insecurity: Food Insecurity Present (01/27/2023)  Housing: Low Risk  (01/27/2023)  Transportation Needs: Unmet Transportation Needs (01/27/2023)  Utilities: Not At Risk (01/27/2023)  Depression (PHQ2-9): Medium Risk (12/14/2019)  Tobacco Use: High Risk (01/29/2023)   SDOH Interventions:     Readmission Risk Interventions     No data to display

## 2023-01-29 NOTE — Discharge Instructions (Signed)
Crisis Mobile: [facility]: [phone] (for crisis response 24 hours a day) [facility] [phone]  Outpatient Substance Use Treatment [place] [facility] Intensive Outpatient Program [address] [place], [place] [address] [phone] Private insurance, Medicare A&B, and [facility]  ADS (Alcohol and Drug Services) [address] [place], [place] [address] [phone] Medicaid, [place] [address] # [place], [place] Medicaid and [facility], Self Pay  The [facility] [address] [phone] [place], [place] [facility], and Self Pay  [facility] [place] [address] [place], [place] [address] [phone] or [phone] Private Insurance Only  [facility] [address] Lady Gary, [place] [phone] Medicaid, Medicare, [place] at the [facility] [address] [place], [place] [address] [phone] Services are free or reduced  [facility] [address] [phone] Self Pay only, sliding scale  [facility] [address] [place], [place] [address] [phone] ([facility]) Self Pay, Medicaid Only  [facility] [place], [place] [address] [phone] Medicaid, Medicare, [place] Residential Substance Use Treatment Services  [facility] ([place] [address] [place], [place] [address] [phone] or [phone] Detox (Medicare, Medicaid, private insurance, and self pay) Residential Rehab 14 days (Medicare, [place], private insurance, and self pay)  RTS (Residential Treatment Services) [place], [place] Female and Female Detox (Self Pay and Medicaid limited  availability) Rehab only Female ([place] and self pay only)  [facility] [address] [place], [place] [address] [phone] or [phone] Detox and [place]. [place], [place] [address] [phone] Treatment Only, must make assessment appointment, and must be sober for assessment appointment. Self Pay Only, Medicare A&B, [facility], [place] ID only! *Transportation assistance offered from  [place] on [address], [place] [address] Walk in interviews M-Sat 8-4p No pending legal charges [phone]  ADATC: [facility] Referral [address] [place], [place] (Self Pay, [facility])  [facility] [address] [place], [place] [address] [phone] Detox and Residential Treatment Medicare and [place]. [place], [place] [address] [place]: [place]: [phone] Long-term Residential Program: [phone] Males 25 and Over (No Insurance, upfront fee)  [place], [place] [address] [phone] Private Insurance with [place], [place], [place] [address] Local ([place]. [place], [place] [address] [phone] (Males, upfront fee)  [place], [place] Locations [facility] [address] [place], [place] Based Program for individuals experiencing homelessness Self Pay, No insurance  [place] Men's program: [facility] [address] [place], [place] [address] [phone]  [place] Women's program: [facility] [address] [place], [place] [address] [phone] Christian Based Program for individuals experiencing homelessness Self Pay, No insurance  [facility] Men's Division [place], [place] [address] [place] for individuals experiencing homelessness Self Pay, No insurance  [facility] Women's Division [place],  [address] [place] for individuals experiencing  homelessness Self Pay, No insurance  [facility] [place], [place] Based Program for males experiencing homelessness Self Pay, No insurance

## 2023-01-29 NOTE — Progress Notes (Signed)
Family at bedside feeding patient. [place] [employer] RN

## 2023-01-29 NOTE — Transfer of Care (Signed)
Immediate Anesthesia Transfer of Care Note  Patient: [name]  Procedure(s) Performed: INTRAMEDULLARY (IM) NAIL INTERTROCHANTERIC (Right: Hip)  Patient Location: PACU  Anesthesia Type:General  Level of Consciousness: sedated, patient cooperative, and responds to stimulation  Airway & Oxygen Therapy: Patient Spontanous Breathing and Patient connected to face mask oxygen  Post-op Assessment: Report given to RN, Post -op Vital signs reviewed and stable, and Patient moving all extremities  Post vital signs: Reviewed and stable  Last Vitals:  Vitals Value Taken Time  BP 93/33 01/29/23 0942  Temp    Pulse 65 01/29/23 0945  Resp 19 01/29/23 0945  SpO2 100 % 01/29/23 0945  Vitals shown include unvalidated device data.  Last Pain:  Vitals:   01/29/23 0647  TempSrc: Oral  PainSc: 3       Patients Stated Pain Goal: 5 ([id number])  Complications: No notable events documented.

## 2023-01-29 NOTE — Anesthesia Preprocedure Evaluation (Signed)
Anesthesia Evaluation  Patient identified by MRN, date of birth, ID band Patient awake    Reviewed: Allergy & Precautions, H&P , NPO status , Patient's Chart, lab work & pertinent test results, reviewed documented beta blocker date and time   History of Anesthesia Complications (+) Family history of anesthesia reaction  Airway Mallampati: II  TM Distance: >3 FB Neck ROM: full    Dental no notable dental hx.    Pulmonary neg pulmonary ROS, asthma , pneumonia, COPD, Current Smoker   Pulmonary exam normal breath sounds clear to auscultation       Cardiovascular Exercise Tolerance: Good hypertension, + CAD, + Past MI and + Cardiac Stents   Rhythm:regular Rate:Normal     Neuro/Psych  Headaches, Seizures -,  PSYCHIATRIC DISORDERS Anxiety      Neuromuscular disease CVA, No Residual Symptoms negative neurological ROS  negative psych ROS   GI/Hepatic negative GI ROS,,,(+)     substance abuse  cocaine use, Hepatitis -  Endo/Other  negative endocrine ROS    Renal/GU negative Renal ROS  negative genitourinary   Musculoskeletal   Abdominal   Peds  Hematology negative hematology ROS (+)   Anesthesia Other Findings   Reproductive/Obstetrics negative OB ROS                             Anesthesia Physical Anesthesia Plan  ASA: 3  Anesthesia Plan: General and General LMA   Post-op Pain Management:    Induction:   PONV Risk Score and Plan: Ondansetron  Airway Management Planned:   Additional Equipment:   Intra-op Plan:   Post-operative Plan:   Informed Consent: I have reviewed the patients History and Physical, chart, labs and discussed the procedure including the risks, benefits and alternatives for the proposed anesthesia with the patient or authorized representative who has indicated his/her understanding and acceptance.     Dental Advisory Given  Plan Discussed with:  CRNA  Anesthesia Plan Comments:        Anesthesia Quick Evaluation

## 2023-01-29 NOTE — Progress Notes (Signed)
   01/29/23 1818  Assess: MEWS Score  Temp 98.6 F (37 C)  BP 93/80  MAP (mmHg) 86  Pulse Rate 74  Resp 18  SpO2 100 %  O2 Device Room Air  Assess: MEWS Score  MEWS Temp 0  MEWS Systolic 1  MEWS Pulse 0  MEWS RR 0  MEWS LOC 1  MEWS Score 2  MEWS Score Color Yellow  Assess: if the MEWS score is Yellow or Red  Were vital signs taken at a resting state? Yes  Focused Assessment No change from prior assessment  Does the patient meet 2 or more of the SIRS criteria? No  Does the patient have a confirmed or suspected source of infection? No  Provider and Rapid Response Notified? No  MEWS guidelines implemented  Yes, yellow  Treat  MEWS Interventions Considered administering scheduled or prn medications/treatments as ordered  Take Vital Signs  Increase Vital Sign Frequency  Yellow: Q2hr x1, continue Q4hrs until patient remains green for 12hrs  Escalate  MEWS: Escalate Yellow: Discuss with charge nurse and consider notifying provider and/or RRT  Notify: Charge Nurse/RN  Name of Charge Nurse/RN Notified Audrea Muscat  Assess: SIRS CRITERIA  SIRS Temperature  0  SIRS Pulse 0  SIRS Respirations  0  SIRS WBC 0  SIRS Score Sum  0

## 2023-01-29 NOTE — Anesthesia Procedure Notes (Signed)
Procedure Name: Intubation Date/Time: 01/29/2023 7:41 AM  Performed by: Myna Bright, CRNAPre-anesthesia Checklist: Patient identified Patient Re-evaluated:Patient Re-evaluated prior to induction Oxygen Delivery Method: Circle system utilized Preoxygenation: Pre-oxygenation with 100% oxygen Induction Type: IV induction Ventilation: Mask ventilation without difficulty Laryngoscope Size: Mac and 3 Grade View: Grade I Tube type: Oral Tube size: 7.0 mm Number of attempts: 1 Airway Equipment and Method: Stylet Placement Confirmation: ETT inserted through vocal cords under direct vision, positive ETCO2 and breath sounds checked- equal and bilateral Secured at: 21 cm Tube secured with: Tape Dental Injury: Teeth and Oropharynx as per pre-operative assessment

## 2023-01-29 NOTE — Progress Notes (Signed)
   ORTHOPAEDIC PROGRESS NOTE  Scheduled for Right Hip Cephalomedullary nail  DOS: 01/29/23  SUBJECTIVE: No issues over night.  Continues to have pain.  No family at bedside.   OBJECTIVE: PE:  Some confusion, moaning in pain  Right hip held in a fixed position.   Toes WWP Mild bruising around the hip  Active motion intact distally.   Vitals:   01/29/23 0445 01/29/23 0647  BP: 102/83 (!) 125/99  Pulse: 90 90  Resp: 16 16  Temp: 98.7 F (37.1 C) 98.3 F (36.8 C)  SpO2: 100% 100%      Latest Ref Rng & Units 01/29/2023    4:40 AM 01/28/2023    4:32 AM 01/27/2023    2:02 AM  CBC  WBC 4.0 - 10.5 K/uL 11.9  9.0  15.0   Hemoglobin 12.0 - 15.0 g/dL 7.4  9.7  11.1   Hematocrit 36.0 - 46.0 % 22.0  28.2  32.6   Platelets 150 - 400 K/uL 258  270  300      ASSESSMENT: [name] is a 67 y.o. female doing well.  Ready for OR.  NPO since midnight.   PLAN: Weightbearing: NWB RLE Insicional and dressing care: Reinforce dressings as needed; none currently Orthopedic device(s): None VTE prophylaxis: Can resume Plavix POD#1 Pain control: PO pain medications as needed; judicious use of narcotics Follow - up plan: 2 weeks   Contact information:     Brooklen Runquist A. Amedeo Kinsman, MD [place] [address] [place],  [place]  [address] Phone: [phone] Fax: [phone]

## 2023-01-30 DIAGNOSIS — E871 Hypo-osmolality and hyponatremia: Secondary | ICD-10-CM | POA: Diagnosis not present

## 2023-01-30 DIAGNOSIS — S72001A Fracture of unspecified part of neck of right femur, initial encounter for closed fracture: Secondary | ICD-10-CM | POA: Diagnosis not present

## 2023-01-30 DIAGNOSIS — D62 Acute posthemorrhagic anemia: Secondary | ICD-10-CM | POA: Diagnosis not present

## 2023-01-30 DIAGNOSIS — S72141A Displaced intertrochanteric fracture of right femur, initial encounter for closed fracture: Secondary | ICD-10-CM | POA: Diagnosis not present

## 2023-01-30 LAB — CBC
HCT: 16.6 % — ABNORMAL LOW (ref 36.0–46.0)
Hemoglobin: 5.6 g/dL — CL (ref 12.0–15.0)
MCH: 29.6 pg (ref 26.0–34.0)
MCHC: 33.7 g/dL (ref 30.0–36.0)
MCV: 87.8 fL (ref 80.0–100.0)
Platelets: 211 10*3/uL (ref 150–400)
RBC: 1.89 MIL/uL — ABNORMAL LOW (ref 3.87–5.11)
RDW: 14.8 % (ref 11.5–15.5)
WBC: 12.3 10*3/uL — ABNORMAL HIGH (ref 4.0–10.5)
nRBC: 0 % (ref 0.0–0.2)

## 2023-01-30 LAB — BASIC METABOLIC PANEL
Anion gap: 8 (ref 5–15)
BUN: 22 mg/dL (ref 8–23)
CO2: 25 mmol/L (ref 22–32)
Calcium: 8.9 mg/dL (ref 8.9–10.3)
Chloride: 107 mmol/L (ref 98–111)
Creatinine, Ser: 0.85 mg/dL (ref 0.44–1.00)
GFR, Estimated: >60 mL/min (ref >60)
Glucose, Bld: 110 mg/dL — ABNORMAL HIGH (ref 70–99)
Potassium: 3.9 mmol/L (ref 3.5–5.1)
Sodium: 140 mmol/L (ref 135–145)

## 2023-01-30 LAB — ABO/RH: ABO/RH(D): A POS

## 2023-01-30 LAB — HEMOGLOBIN AND HEMATOCRIT, BLOOD
HCT: 23.7 % — ABNORMAL LOW (ref 36.0–46.0)
Hemoglobin: 8.1 g/dL — ABNORMAL LOW (ref 12.0–15.0)

## 2023-01-30 LAB — PREPARE RBC (CROSSMATCH)

## 2023-01-30 MED ORDER — SODIUM CHLORIDE 0.9% IV SOLUTION: Freq: Once | INTRAVENOUS | Status: AC

## 2023-01-30 MED ORDER — CHLORHEXIDINE GLUCONATE CLOTH 2 % EX PADS
6.0000 | MEDICATED_PAD | Freq: Every day | CUTANEOUS | Status: DC
  Administered 2023-01-30 – 2023-02-03 (×5): 6 via TOPICAL

## 2023-01-30 NOTE — Anesthesia Postprocedure Evaluation (Signed)
Anesthesia Post Note  Patient: [name]  Procedure(s) Performed: INTRAMEDULLARY (IM) NAIL INTERTROCHANTERIC (Right: Hip)  Patient location during evaluation: Phase II Anesthesia Type: General Level of consciousness: awake Pain management: pain level controlled Vital Signs Assessment: post-procedure vital signs reviewed and stable Respiratory status: spontaneous breathing and respiratory function stable Cardiovascular status: blood pressure returned to baseline and stable Postop Assessment: no headache and no apparent nausea or vomiting Anesthetic complications: no Comments: Late entry   No notable events documented.   Last Vitals:  Vitals:   01/29/23 2352 01/30/23 0328  BP: (!) 143/91 (!) 152/91  Pulse: 78 83  Resp: 18 20  Temp: 36.9 C 36.9 C  SpO2: 100% 100%    Last Pain:  Vitals:   01/30/23 0450  TempSrc:   PainSc: [place]

## 2023-01-30 NOTE — Progress Notes (Signed)
PROGRESS NOTE    [name]  [id number] DOB: 04/04/1956 DOA: 01/26/2023 PCP: [name], MD    Brief Narrative:   [name] is a 67 y.o. female with past medical history significant for HTN, HLD, COPD, CAD, history of CVA, history of left breast cancer, polysubstance abuse with EtOH, cocaine who [facility] ED on 3/11 via EMS from home after fall at home with complaints of right hip pain.  Patient denied hitting her head or loss of consciousness.  Endorses Plavix use but denies any other anticoagulant.  In the ED, temperature 97.6 F, HR 57, RR 18, BP 140/70, SpO2 100% on room air.  WBC 15.0, hemoglobin 11.1, platelets 300.  Sodium 132, potassium 3.5, chloride 100, CO2 27, glucose 123, BUN 13, creatinine 0.86.  Magnesium 2.1.  Chest x-ray with no active cardiopulmonary disease process.  Right hip/pelvis x-ray with acute proximal right femur fracture.  CT right hip with comminuted, displaced and angulated intertrochanteric fracture of the right femur.  Orthopedics was consulted.  TRH consulted for admission and further evaluation and management of acute right hip fracture.  Assessment & Plan:   Acute comminuted/displaced/angulated intertrochanteric right femur fracture Patient presenting to ED following fall at home with associated right hip pain and inability to ambulate.  Imaging on admission notable for comminuted, displaced, angulated intertrochanteric fracture of the right femur.  Orthopedics was consulted and patient underwent cephalic medullary nailing by Dr. Amedeo Kinsman on 01/29/2023. -- Orthopedics following, appreciate assistance -- Oxycodone 5 mg p.o. every 4 hours for moderate pain -- Dilaudid 0.5 mg IV every 3 hours as needed severe pain -- Robaxin 500 mg IV/p.o. every 6 hours muscle spasms -- Continue to hold Lasix given anemia -- PT/OT evaluation postoperatively, anticipate likely need of SNF placement; but likely will be difficult given active cocaine  use  Acute blood loss anemia Hemoglobin 11.1 on admission, has trended down to 7.4 today.  Likely in the setting of long bone fracture.  Anemia panel with iron 22, TIBC 232, ferritin 187, B12 560, folate 8.4. -- Hgb 11.1>9.7>7.4>5.6 -- Order for 1 unit PRBC placed this morning, will repeat hemoglobin 2 hours thereafter, anticipate likely need of a second unit --Holding home Plavix -- Repeat CBC in a.m. -- Transfuse for hemoglobin less than 7.0  Vitamin D deficiency Vitamin D 25-hydroxy level low at 19.70. -- Drisdol 50,000 units p.o. q. 7 days  Essential hypertension -- Hold home lisinopril/HCTZ, carvedilol, amlodipine for now -- Continue statin -- Holding Plavix as above -- Continue monitor BP closely  Hyperlipidemia -- Atorvastatin 40 mg p.o. daily  COPD -- Albuterol neb every 4 hours.  Wheezing/shortness of breath  CAD Hx CVA -- Continue statin -- Holding Plavix as above  Polysubstance abuse Patient with known history of alcohol, cocaine abuse.  UDS on admission positive for opiates, THC, cocaine.  Counseled on need for complete cessation/abstinence. -- CIWA protocol with symptom triggered Ativan -- Thiamine, folate, multivitamin -- TOC consult for substance abuse resources  Severe protein calorie malnutrition Body mass index is 16.65 kg/m.  As noted with significant muscle wasting, flat depletion noted on physical exam. Nutrition Status: Nutrition Problem: Inadequate oral intake Etiology: lethargy/confusion, acute illness Signs/Symptoms: meal completion < 25% Interventions: Ensure Enlive (each supplement provides 350kcal and 20 grams of protein), Magic cup, MVI -- Dietitian following, encourage increased oral intake, supplementation    DVT prophylaxis: Place and maintain sequential compression device Start: 01/27/23 0746 SCDs Start: 01/27/23 [phone]  Code Status: Full Code Family Communication: No family present at bedside this morning  Disposition Plan:   Level of care: Med-Surg Status is: Inpatient Remains inpatient appropriate because: Pending PT/OT evaluation, transfusing 1 unit PRBC today,  will be likely difficult to place in SNF given her active cocaine use per [facility]    Consultants:  Orthopedics  Procedures:  Cephalomedullary nail right intertrochanteric femur fracture, Dr. Amedeo Kinsman; 01/29/2023  Antimicrobials:  Perioperative cefazolin   Subjective: Patient seen examined bedside, resting comfortably, lying in bed.  Complaining of soreness to right hip.  Hemoglobin down to 5.6 this morning, 1 unit ordered by overnight provider.  No other complaints or concerns at this time.  Denies headache, no chest pain, no shortness of breath, no abdominal pain.  No acute events overnight per nursing staff.  Objective: Vitals:   01/29/23 1818 01/29/23 1943 01/29/23 2352 01/30/23 0328  BP: 93/80 (!) 145/80 (!) 143/91 (!) 152/91  Pulse: 74 71 78 83  Resp: 18 18 18 20   Temp: 98.6 F (37 C) 97.8 F (36.6 C) 98.4 F (36.9 C) 98.5 F (36.9 C)  TempSrc:  Oral Oral Oral  SpO2: 100% 100% 100% 100%  Weight:      Height:        Intake/Output Summary (Last 24 hours) at 01/30/2023 1040 Last data filed at 01/30/2023 0400 Gross per 24 hour  Intake 1123.62 ml  Output 1550 ml  Net -426.38 ml   Filed Weights   01/26/23 2335  Weight: 44 kg    Examination:  Physical Exam: GEN: NAD, alert, chronically ill/cachectic in appearance, appears older than stated age HEENT: NCAT, PERRL, EOMI, sclera clear, MMM PULM: CTAB w/o wheezes/crackles, normal respiratory effort, on room air CV: RRR w/o M/G/R GI: abd soft, NTND, NABS, no R/G/M MSK: no peripheral edema, right hip with surgical dressing in place, clean/dry/intact, no concerning areas of fluctuance/erythema, ecchymosis. Neurovascularly intact NEURO: CN II-XII intact, no focal deficits, sensation to light touch intact PSYCH: Depressed mood, flat affect Integumentary: Right hip operative site as  above, otherwise no concerning rashes/lesions/wounds noted on exposed skin surfaces    Data Reviewed: I have personally reviewed following labs and imaging studies  CBC: Recent Labs  Lab 01/25/23 0003 01/27/23 0202 01/28/23 0432 01/29/23 0440 01/30/23 0535  WBC 9.6 15.0* 9.0 11.9* 12.3*  NEUTROABS  --  12.9*  --   --   --   HGB 12.2 11.1* 9.7* 7.4* 5.6*  HCT 35.3* 32.6* 28.2* 22.0* 16.6*  MCV 85.7 86.0 84.7 87.0 87.8  PLT 325 300 270 258 [id number]   Basic Metabolic Panel: Recent Labs  Lab 01/25/23 0003 01/27/23 0202 01/27/23 0708 01/28/23 0432 01/29/23 0440 01/30/23 0535  NA 133* 132*  --  134* 137 140  K 4.0 3.5  --  3.7 3.9 3.9  CL 98 100  --  100 105 107  CO2 28 27  --  26 21* 25  GLUCOSE 129* 123*  --  110* 101* 110*  BUN 10 13  --  19 44* 22  CREATININE 0.90 0.86  --  1.09* 0.93 0.85  CALCIUM 9.6 9.5  --  9.1 8.9 8.9  MG  --   --  2.1  --  2.2  --   PHOS  --   --  3.6  --   --   --    GFR: Estimated Creatinine Clearance: 44 mL/min (by C-G formula based on SCr of 0.85 mg/dL). Liver Function Tests: Recent Labs  Lab 01/25/23  0003 01/28/23 0432  AST 24 16  ALT 15 16  ALKPHOS 52 39  BILITOT 0.5 0.6  PROT 8.0 6.5  ALBUMIN 4.2 3.3*   No results for input(s): "LIPASE", "AMYLASE" in the last 168 hours. No results for input(s): "AMMONIA" in the last 168 hours. Coagulation Profile: Recent Labs  Lab 01/27/23 0202  INR 1.1   Cardiac Enzymes: No results for input(s): "CKTOTAL", "CKMB", "CKMBINDEX", "TROPONINI" in the last 168 hours. BNP (last 3 results) No results for input(s): "PROBNP" in the last 8760 hours. HbA1C: No results for input(s): "HGBA1C" in the last 72 hours. CBG: No results for input(s): "GLUCAP" in the last 168 hours. Lipid Profile: No results for input(s): "CHOL", "HDL", "LDLCALC", "TRIG", "CHOLHDL", "LDLDIRECT" in the last 72 hours. Thyroid Function Tests: No results for input(s): "TSH", "T4TOTAL", "FREET4", "T3FREE", "THYROIDAB" in the last  72 hours. Anemia Panel: Recent Labs    01/29/23 1209  VITAMINB12 560  FOLATE 8.4  FERRITIN 187  TIBC 232*  IRON 22*  RETICCTPCT 1.3   Sepsis Labs: No results for input(s): "PROCALCITON", "LATICACIDVEN" in the last 168 hours.  Recent Results (from the past 240 hour(s))  MRSA Next Gen by PCR, Nasal     Status: None   Collection Time: 01/27/23  5:10 PM   Specimen: Nasal Mucosa; Nasal Swab  Result Value Ref Range Status   MRSA by PCR Next Gen NOT DETECTED NOT DETECTED Final    Comment: (NOTE) The GeneXpert MRSA Assay (FDA approved for NASAL specimens only), is one component of a comprehensive MRSA colonization surveillance program. It is not intended to diagnose MRSA infection nor to guide or monitor treatment for MRSA infections. Test performance is not FDA approved in patients less than 60 years old. Performed at [facility], [address] [place], [place] [address]          Radiology Studies: DG Pelvis Portable  Result Date: 01/29/2023 CLINICAL DATA:  Postop EXAM: PORTABLE PELVIS 1-2 VIEWS COMPARISON:  None Available. FINDINGS: Interval ORIF of the right femur. No evidence of immediate hardware complications. Expected postoperative soft tissue gas. Vascular calcifications. No other fractures are visualized. Assessment of the sacrum is limited due to overlying bowel gas. IMPRESSION: Expected postsurgical changes from recent ORIF of right femur fracture. Electronically Signed   By: Marin Roberts M.D.   On: 01/29/2023 10:14   DG FEMUR PORT, MIN 2 VIEWS RIGHT  Result Date: 01/29/2023 CLINICAL DATA:  Postoperative right intramedullary nail EXAM: RIGHT FEMUR PORTABLE 2 VIEW COMPARISON:  01/29/2023 FINDINGS: Status post intramedullary nail fixation of intratrochanteric fractures of the right femur with expected overlying postoperative change. No evidence of perihardware fracture or component malpositioning. IMPRESSION: Status post intramedullary nail fixation of intratrochanteric  fractures of the right femur with expected overlying postoperative change. No evidence of perihardware fracture or component malpositioning. Electronically Signed   By: Delanna Ahmadi M.D.   On: 01/29/2023 10:09   DG FEMUR, MIN 2 VIEWS RIGHT  Result Date: 01/29/2023 CLINICAL DATA:  Fracture right femur, fluoroscopic assistance for internal fixation EXAM: RIGHT FEMUR 2 VIEWS COMPARISON:  01/26/2023 FINDINGS: Fluoroscopic images show reduction and internal fixation of intertrochanteric fracture of right femur with intramedullary rod. Fluoroscopic time was 63 seconds. Radiation dose 6.81 mGy. IMPRESSION: Fluoroscopic assistance was provided for internal fixation of intertrochanteric fracture of proximal right femur. Electronically Signed   By: Elmer Picker M.D.   On: 01/29/2023 09:58   DG C-Arm 1-60 Min-No Report  Result Date: 01/29/2023 Fluoroscopy was utilized by  the requesting physician.  No radiographic interpretation.        Scheduled Meds:  sodium chloride   Intravenous Once   atorvastatin  40 mg Oral Daily   Chlorhexidine Gluconate Cloth  6 each Topical Daily   feeding supplement  237 mL Oral BID BM   folic acid  1 mg Oral Daily   multivitamin with minerals  1 tablet Oral Daily   polyethylene glycol  17 g Oral BID   thiamine  100 mg Oral Daily   Or   thiamine  100 mg Intravenous Daily   Vitamin D (Ergocalciferol)  50,000 Units Oral Q7 days   Continuous Infusions:  sodium chloride 75 mL/hr at 01/30/23 0607     LOS: 3 days    Time spent: 48 minutes spent on chart review, discussion with nursing staff, consultants, updating family and interview/physical exam; more than 50% of that time was spent in counseling and/or coordination of care.    Manna Gose J [place] DO [facility] Available via Epic secure chat 7am-7pm After these hours, please refer to coverage provider listed on [email or web address] 01/30/2023, 10:40 AM

## 2023-01-30 NOTE — Plan of Care (Signed)
  Problem: Acute Rehab PT Goals(only PT should resolve) Goal: Pt Will Go Supine/Side To Sit Outcome: Progressing Flowsheets (Taken 01/30/2023 1537) Pt will go Supine/Side to Sit: with minimal assist Goal: Patient Will Transfer Sit To/From Stand Outcome: Progressing Flowsheets (Taken 01/30/2023 1537) Patient will transfer sit to/from stand: with minimal assist Goal: Pt Will Transfer Bed To Chair/Chair To Bed Outcome: Progressing Flowsheets (Taken 01/30/2023 1537) Pt will Transfer Bed to Chair/Chair to Bed: with min assist Goal: Pt Will Ambulate Outcome: Progressing Flowsheets (Taken 01/30/2023 1537) Pt will Ambulate:  50 feet  with minimal assist  with rolling walker   3:37 PM, 01/30/23 Lonell Grandchild, MPT Physical Therapist with [facility] [phone] office [phone] mobile phone

## 2023-01-30 NOTE — Progress Notes (Signed)
Date and time results received: 01/30/23 0630   Test: hgb Critical Value: 5.6  Name of Provider Notified: Dr. Clearence Ped notified [phone]  Orders Received? Or Actions Taken?: Orders Received - See Orders for details 1 unit of blood.  Dr. Amedeo Kinsman notifed as well

## 2023-01-30 NOTE — Evaluation (Signed)
Physical Therapy Evaluation Patient Details Name: [name] MRN: [id number] DOB: 08/01/1956 Today's Date: 01/30/2023  History of Present Illness  [name] is a 67 y.o. female s/p Cephalomedullary nail for Right intertrochanteric femur fracture on 01/29/23, with medical history significant of hypertension, hyperlipidemia, COPD, coronary artery disease, stroke, left breast cancer who presents to the emergency department due to a fall sustained at home.  Patient complained of right hip pain, she denies hitting her head, LOC.  She takes Plavix, but denies use of any anticoagulant.   Clinical Impression  Patient demonstrates slow labored movement for sitting up at bedside due to c/o severe pain right hip, tends to hold onto leg when guided to bedside, once seated able to let RLE touch floor.  Patient limited to a few slow labored side steps at bedside before having to sit due to fatigue and RLE pain.  Patient tolerated sitting up in chair after therapy - nurse notified.  Patient will benefit from continued skilled physical therapy in hospital and recommended venue below to increase strength, balance, endurance for safe ADLs and gait.          Recommendations for follow up therapy are one component of a multi-disciplinary discharge planning process, led by the attending physician.  Recommendations may be updated based on patient status, additional functional criteria and insurance authorization.  Follow Up Recommendations Skilled nursing-short term rehab (<3 hours/day) Can patient physically be transported by private vehicle: No    Assistance Recommended at Discharge Set up Supervision/Assistance  Patient can return home with the following  A lot of help with bathing/dressing/bathroom;A lot of help with walking and/or transfers;Help with stairs or ramp for entrance;Assistance with cooking/housework    Equipment Recommendations None recommended by PT  Recommendations for Other Services        Functional Status Assessment Patient has had a recent decline in their functional status and demonstrates the ability to make significant improvements in function in a reasonable and predictable amount of time.     Precautions / Restrictions Precautions Precautions: Fall Restrictions Weight Bearing Restrictions: No      Mobility  Bed Mobility Overal bed mobility: Needs Assistance Bed Mobility: Supine to Sit     Supine to sit: Mod assist     General bed mobility comments: increased time, labored movement    Transfers Overall transfer level: Needs assistance Equipment used: Rolling walker (2 wheels) Transfers: Sit to/from Stand, Bed to chair/wheelchair/BSC Sit to Stand: Mod assist           General transfer comment: slow labored movement requiring verbal cueing for proper hand placement on RW with fair/good carryover    Ambulation/Gait Ambulation/Gait assistance: Mod assist Gait Distance (Feet): 5 Feet Assistive device: Rolling walker (2 wheels) Gait Pattern/deviations: Decreased step length - right, Decreased step length - left, Decreased stride length, Antalgic, Decreased stance time - right, Trunk flexed Gait velocity: slow     General Gait Details: limited to a few slow labored unsteady side steps before having to sit due to fatigue  Stairs            Wheelchair Mobility    Modified Rankin (Stroke Patients Only)       Balance Overall balance assessment: Needs assistance Sitting-balance support: Feet supported, No upper extremity supported Sitting balance-Leahy Scale: Fair Sitting balance - Comments: seated at EOB   Standing balance support: During functional activity, Bilateral upper extremity supported, Reliant on assistive device for balance Standing balance-Leahy Scale: Poor Standing balance comment:  fair/poor using RW                             Pertinent Vitals/Pain Pain Assessment Pain Assessment: Faces Faces Pain  Scale: Hurts even more Pain Location: right hip with movement Pain Descriptors / Indicators: Sore, Guarding, Grimacing, Sharp Pain Intervention(s): Limited activity within patient's tolerance, Monitored during session, Patient requesting pain meds-RN notified    Home Living Family/patient expects to be discharged to:: Private residence Living Arrangements: Other relatives Available Help at Discharge: Family;Available PRN/intermittently Type of Home: House Home Access: Ramped entrance       Home Layout: One level Home Equipment: Shower seat;Grab bars - tub/shower;Wheelchair Probation officer (4 wheels)      Prior Function Prior Level of Function : Independent/Modified Independent             Mobility Comments: household and short distanced community ambulator with Rollator PRN ADLs Comments: Independent     Hand Dominance        Extremity/Trunk Assessment   Upper Extremity Assessment Upper Extremity Assessment: Defer to OT evaluation    Lower Extremity Assessment Lower Extremity Assessment: Generalized weakness;RLE deficits/detail RLE Deficits / Details: grossly -4/5 RLE: Unable to fully assess due to pain RLE Sensation: WNL RLE Coordination: WNL    Cervical / Trunk Assessment Cervical / Trunk Assessment: Normal  Communication   Communication: No difficulties  Cognition Arousal/Alertness: Awake/alert Behavior During Therapy: WFL for tasks assessed/performed Overall Cognitive Status: Within Functional Limits for tasks assessed                                          General Comments      Exercises     Assessment/Plan    PT Assessment Patient needs continued PT services  PT Problem List Decreased strength;Decreased activity tolerance;Decreased balance;Decreased mobility       PT Treatment Interventions DME instruction;Gait training;Stair training;Functional mobility training;Therapeutic exercise;Therapeutic activities;Patient/family  education;Balance training    PT Goals (Current goals can be found in the Care Plan section)  Acute Rehab PT Goals Patient Stated Goal: return home with family to assist PT Goal Formulation: With patient Time For Goal Achievement: 02/13/23 Potential to Achieve Goals: Good    Frequency Min 3X/week     Co-evaluation               AM-PAC PT "6 Clicks" Mobility  Outcome Measure Help needed turning from your back to your side while in a flat bed without using bedrails?: A Lot Help needed moving from lying on your back to sitting on the side of a flat bed without using bedrails?: A Lot Help needed moving to and from a bed to a chair (including a wheelchair)?: A Lot Help needed standing up from a chair using your arms (e.g., wheelchair or bedside chair)?: A Lot Help needed to walk in hospital room?: A Lot Help needed climbing 3-5 steps with a railing? : A Lot 6 Click Score: 12    End of Session   Activity Tolerance: Patient tolerated treatment well;Patient limited by fatigue Patient left: in chair;with call bell/phone within reach Nurse Communication: Mobility status PT Visit Diagnosis: Unsteadiness on feet (R26.81);Other abnormalities of gait and mobility (R26.89);Muscle weakness (generalized) (M62.81)    Time: [id number] PT Time Calculation (min) (ACUTE ONLY): 30 min   Charges:   PT Evaluation $PT Eval  Moderate Complexity: 1 Mod PT Treatments $Therapeutic Exercise: 23-37 mins        3:36 PM, 01/30/23 Lonell Grandchild, MPT Physical Therapist with [facility] 336 [phone] office [phone] mobile phone

## 2023-01-30 NOTE — Progress Notes (Signed)
   ORTHOPAEDIC PROGRESS NOTE  S/p Right Hip Cephalomedullary nail  DOS: 01/29/23  SUBJECTIVE: Resting comfortably.  Some spasm pain when evaluating her leg.  No issues over night.  Pain is controlled  OBJECTIVE: PE:  Alert.  Muscle spasm during eval  Lateral hip dressings are clean, dry and intact   Toes WWP Mild bruising around the hip  Active motion intact distally, able to wiggle toes and ankle, but limited (baseline)  Vitals:   01/29/23 2352 01/30/23 0328  BP: (!) 143/91 (!) 152/91  Pulse: 78 83  Resp: 18 20  Temp: 98.4 F (36.9 C) 98.5 F (36.9 C)  SpO2: 100% 100%      Latest Ref Rng & Units 01/30/2023    5:35 AM 01/29/2023    4:40 AM 01/28/2023    4:32 AM  CBC  WBC 4.0 - 10.5 K/uL 12.3  11.9  9.0   Hemoglobin 12.0 - 15.0 g/dL 5.6  7.4  9.7   Hematocrit 36.0 - 46.0 % 16.6  22.0  28.2   Platelets 150 - 400 K/uL 211  258  270      ASSESSMENT: [name] is a 67 y.o. female stable.  Hb low, recommend transfuse 2 U PRBC.  First unit has been ordered.   PLAN: Weightbearing: WBAT RLE Insicional and dressing care: Reinforce dressings as needed Orthopedic device(s): None VTE prophylaxis: Can resume Plavix POD#1 Pain control: PO pain medications as needed; judicious use of narcotics Follow - up plan: 2 weeks  Awaiting PT.  Likely delay due to low Hb.  Dispo pending.    Contact information:     Jansen Sciuto A. Amedeo Kinsman, MD [facility] [address] [place],  [place]  [address] Phone: [phone] Fax: [phone]

## 2023-01-31 DIAGNOSIS — E871 Hypo-osmolality and hyponatremia: Secondary | ICD-10-CM | POA: Diagnosis not present

## 2023-01-31 DIAGNOSIS — S72001A Fracture of unspecified part of neck of right femur, initial encounter for closed fracture: Secondary | ICD-10-CM | POA: Diagnosis not present

## 2023-01-31 DIAGNOSIS — S72141A Displaced intertrochanteric fracture of right femur, initial encounter for closed fracture: Secondary | ICD-10-CM | POA: Diagnosis not present

## 2023-01-31 LAB — CBC
HCT: 22.8 % — ABNORMAL LOW (ref 36.0–46.0)
Hemoglobin: 7.6 g/dL — ABNORMAL LOW (ref 12.0–15.0)
MCH: 29.1 pg (ref 26.0–34.0)
MCHC: 33.3 g/dL (ref 30.0–36.0)
MCV: 87.4 fL (ref 80.0–100.0)
Platelets: 262 10*3/uL (ref 150–400)
RBC: 2.61 MIL/uL — ABNORMAL LOW (ref 3.87–5.11)
RDW: 14.7 % (ref 11.5–15.5)
WBC: 11.9 10*3/uL — ABNORMAL HIGH (ref 4.0–10.5)
nRBC: 0 % (ref 0.0–0.2)

## 2023-01-31 LAB — BASIC METABOLIC PANEL
Anion gap: 8 (ref 5–15)
BUN: 14 mg/dL (ref 8–23)
CO2: 26 mmol/L (ref 22–32)
Calcium: 8.7 mg/dL — ABNORMAL LOW (ref 8.9–10.3)
Chloride: 103 mmol/L (ref 98–111)
Creatinine, Ser: 0.78 mg/dL (ref 0.44–1.00)
GFR, Estimated: >60 mL/min (ref >60)
Glucose, Bld: 97 mg/dL (ref 70–99)
Potassium: 3.8 mmol/L (ref 3.5–5.1)
Sodium: 137 mmol/L (ref 135–145)

## 2023-01-31 LAB — PREPARE RBC (CROSSMATCH)

## 2023-01-31 MED ORDER — SODIUM CHLORIDE 0.9% IV SOLUTION
Freq: Once | INTRAVENOUS | Status: AC
  Administered 2023-01-31: 100 mL via INTRAVENOUS

## 2023-01-31 NOTE — Progress Notes (Signed)
PROGRESS NOTE    [name]  [id number] DOB: 1956-10-10 DOA: 01/26/2023 PCP: [name], MD    Brief Narrative:   [name] is a 67 y.o. female with past medical history significant for HTN, HLD, COPD, CAD, history of CVA, history of left breast cancer, polysubstance abuse with EtOH, cocaine who presented to [facility] ED on 3/11 via EMS from home after fall at home with complaints of right hip pain.  Patient denied hitting her head or loss of consciousness.  Endorses Plavix use but denies any other anticoagulant.  In the ED, temperature 97.6 F, HR 57, RR 18, BP 140/70, SpO2 100% on room air.  WBC 15.0, hemoglobin 11.1, platelets 300.  Sodium 132, potassium 3.5, chloride 100, CO2 27, glucose 123, BUN 13, creatinine 0.86.  Magnesium 2.1.  Chest x-ray with no active cardiopulmonary disease process.  Right hip/pelvis x-ray with acute proximal right femur fracture.  CT right hip with comminuted, displaced and angulated intertrochanteric fracture of the right femur.  Orthopedics was consulted.  TRH consulted for admission and further evaluation and management of acute right hip fracture.  Assessment & Plan:   Acute comminuted/displaced/angulated intertrochanteric right femur fracture Patient presenting to ED following fall at home with associated right hip pain and inability to ambulate.  Imaging on admission notable for comminuted, displaced, angulated intertrochanteric fracture of the right femur.  Orthopedics was consulted and patient underwent cephalic medullary nailing by Dr. Amedeo Kinsman on 01/29/2023. -- Orthopedics following, appreciate assistance -- Oxycodone 5 mg p.o. every 4 hours for moderate pain -- Dilaudid 0.5 mg IV every 3 hours as needed severe pain -- Robaxin 500 mg IV/p.o. every 6 hours muscle spasms -- Continue to hold Lasix given anemia -- PT/OT recommends SNF but patient declining and would like to return home; also would be difficult placement given her active  substance abuse per TOC -- Continue therapy efforts while inpatient  Acute blood loss anemia Hemoglobin 11.1 on admission, has trended down to 7.4 today.  Likely in the setting of long bone fracture.  Anemia panel with iron 22, TIBC 232, ferritin 187, B12 560, folate 8.4.  Transfuse 1 unit PRBC on 3/16. -- Hgb 11.1>9.7>7.4>5.6>8.1>7.6 -- Transfuse 1 unit PRBC today; repeat H/H 2 hours following completion of transfusion -- Holding home Plavix -- Repeat CBC in a.m.  Vitamin D deficiency Vitamin D 25-hydroxy level low at 19.70. -- Drisdol 50,000 units p.o. q. 7 days  Essential hypertension -- Hold home lisinopril/HCTZ, carvedilol, amlodipine for now -- Continue statin -- Holding Plavix as above -- Continue monitor BP closely  Hyperlipidemia -- Atorvastatin 40 mg p.o. daily  COPD -- Albuterol neb every 4 hours.  Wheezing/shortness of breath  CAD Hx CVA -- Continue statin -- Holding Plavix as above  Polysubstance abuse Patient with known history of alcohol, cocaine abuse.  UDS on admission positive for opiates, THC, cocaine.  Counseled on need for complete cessation/abstinence. -- CIWA protocol with symptom triggered Ativan -- Thiamine, folate, multivitamin -- TOC consult for substance abuse resources  Severe protein calorie malnutrition Body mass index is 16.65 kg/m.  As noted with significant muscle wasting, flat depletion noted on physical exam. Nutrition Status: Nutrition Problem: Inadequate oral intake Etiology: lethargy/confusion, acute illness Signs/Symptoms: meal completion < 25% Interventions: Ensure Enlive (each supplement provides 350kcal and 20 grams of protein), Magic cup, MVI -- Dietitian following, encourage increased oral intake, supplementation    DVT prophylaxis: Place and maintain sequential compression device Start: 01/27/23 0746 SCDs Start:  01/27/23 0708    Code Status: Full Code Family Communication: No family present at bedside this  morning  Disposition Plan:  Level of care: Med-Surg Status is: Inpatient Remains inpatient appropriate because: transfusing 1 unit PRBC today,  will be likely difficult to place in SNF given her active cocaine use per TOC; and patient desires to return home with home health.  Anticipate likely discharge on Monday.    Consultants:  Orthopedics  Procedures:  Cephalomedullary nail right intertrochanteric femur fracture, Dr. Amedeo Kinsman; 01/29/2023  Antimicrobials:  Perioperative cefazolin   Subjective: Patient seen examined bedside, resting comfortably, lying in bed.  Sleeping but easily arousable.  Hemoglobin improved but still low at 7.6, will give an additional unit of blood today.  Discussed with patient needs to continue therapy efforts while inpatient with likely anticipated discharge with home health services on Monday.  Denies headache, no chest pain, no shortness of breath, no abdominal pain.  No acute events overnight per nursing staff.  Objective: Vitals:   01/30/23 1309 01/30/23 2149 01/31/23 0455 01/31/23 1131  BP: (!) 142/76 103/67 (!) 164/81 91/70  Pulse: 71 77 (!) 57 80  Resp: 17 14 14 18   Temp: 98.3 F (36.8 C) 98.2 F (36.8 C) 98.6 F (37 C) 98.2 F (36.8 C)  TempSrc: Oral Oral Oral Oral  SpO2: 100% 100% 93% 100%  Weight:      Height:        Intake/Output Summary (Last 24 hours) at 01/31/2023 1201 Last data filed at 01/30/2023 1839 Gross per 24 hour  Intake 510 ml  Output --  Net 510 ml   Filed Weights   01/26/23 2335  Weight: 44 kg    Examination:  Physical Exam: GEN: NAD, alert, chronically ill/cachectic in appearance, appears older than stated age HEENT: NCAT, PERRL, EOMI, sclera clear, MMM PULM: CTAB w/o wheezes/crackles, normal respiratory effort, on room air CV: RRR w/o M/G/R GI: abd soft, NTND, NABS, no R/G/M MSK: no peripheral edema, right hip with surgical dressing in place, clean/dry/intact, no concerning areas of fluctuance/erythema,  ecchymosis. Neurovascularly intact NEURO: CN II-XII intact, no focal deficits, sensation to light touch intact PSYCH: Depressed mood, flat affect Integumentary: Right hip operative site as above, otherwise no concerning rashes/lesions/wounds noted on exposed skin surfaces    Data Reviewed: I have personally reviewed following labs and imaging studies  CBC: Recent Labs  Lab 01/27/23 0202 01/28/23 0432 01/29/23 0440 01/30/23 0535 01/30/23 1721 01/31/23 0652  WBC 15.0* 9.0 11.9* 12.3*  --  11.9*  NEUTROABS 12.9*  --   --   --   --   --   HGB 11.1* 9.7* 7.4* 5.6* 8.1* 7.6*  HCT 32.6* 28.2* 22.0* 16.6* 23.7* 22.8*  MCV 86.0 84.7 87.0 87.8  --  87.4  PLT 300 270 258 211  --  [id number]   Basic Metabolic Panel: Recent Labs  Lab 01/27/23 0202 01/27/23 0708 01/28/23 0432 01/29/23 0440 01/30/23 0535 01/31/23 0652  NA 132*  --  134* 137 140 137  K 3.5  --  3.7 3.9 3.9 3.8  CL 100  --  100 105 107 103  CO2 27  --  26 21* 25 26  GLUCOSE 123*  --  110* 101* 110* 97  BUN 13  --  19 44* 22 14  CREATININE 0.86  --  1.09* 0.93 0.85 0.78  CALCIUM 9.5  --  9.1 8.9 8.9 8.7*  MG  --  2.1  --  2.2  --   --  PHOS  --  3.6  --   --   --   --    GFR: Estimated Creatinine Clearance: 46.8 mL/min (by C-G formula based on SCr of 0.78 mg/dL). Liver Function Tests: Recent Labs  Lab 01/25/23 0003 01/28/23 0432  AST 24 16  ALT 15 16  ALKPHOS 52 39  BILITOT 0.5 0.6  PROT 8.0 6.5  ALBUMIN 4.2 3.3*   No results for input(s): "LIPASE", "AMYLASE" in the last 168 hours. No results for input(s): "AMMONIA" in the last 168 hours. Coagulation Profile: Recent Labs  Lab 01/27/23 0202  INR 1.1   Cardiac Enzymes: No results for input(s): "CKTOTAL", "CKMB", "CKMBINDEX", "TROPONINI" in the last 168 hours. BNP (last 3 results) No results for input(s): "PROBNP" in the last 8760 hours. HbA1C: No results for input(s): "HGBA1C" in the last 72 hours. CBG: No results for input(s): "GLUCAP" in the last 168  hours. Lipid Profile: No results for input(s): "CHOL", "HDL", "LDLCALC", "TRIG", "CHOLHDL", "LDLDIRECT" in the last 72 hours. Thyroid Function Tests: No results for input(s): "TSH", "T4TOTAL", "FREET4", "T3FREE", "THYROIDAB" in the last 72 hours. Anemia Panel: Recent Labs    01/29/23 1209  VITAMINB12 560  FOLATE 8.4  FERRITIN 187  TIBC 232*  IRON 22*  RETICCTPCT 1.3   Sepsis Labs: No results for input(s): "PROCALCITON", "LATICACIDVEN" in the last 168 hours.  Recent Results (from the past 240 hour(s))  MRSA Next Gen by PCR, Nasal     Status: None   Collection Time: 01/27/23  5:10 PM   Specimen: Nasal Mucosa; Nasal Swab  Result Value Ref Range Status   MRSA by PCR Next Gen NOT DETECTED NOT DETECTED Final    Comment: (NOTE) The GeneXpert MRSA Assay (FDA approved for NASAL specimens only), is one component of a comprehensive MRSA colonization surveillance program. It is not intended to diagnose MRSA infection nor to guide or monitor treatment for MRSA infections. Test performance is not FDA approved in patients less than 54 years old. Performed at [facility], [address], [place], [place] [address]          Radiology Studies: No results found.      Scheduled Meds:  sodium chloride   Intravenous Once   atorvastatin  40 mg Oral Daily   Chlorhexidine Gluconate Cloth  6 each Topical Daily   feeding supplement  237 mL Oral BID BM   folic acid  1 mg Oral Daily   multivitamin with minerals  1 tablet Oral Daily   polyethylene glycol  17 g Oral BID   thiamine  100 mg Oral Daily   Or   thiamine  100 mg Intravenous Daily   Vitamin D (Ergocalciferol)  50,000 Units Oral Q7 days   Continuous Infusions:     LOS: 4 days    Time spent: 48 minutes spent on chart review, discussion with nursing staff, consultants, updating family and interview/physical exam; more than 50% of that time was spent in counseling and/or coordination of care.    Loma [place] J [place] DO [facility] Available via Epic secure chat 7am-7pm After these hours, please refer to coverage provider listed on [email or web address] 01/31/2023, 12:01 PM

## 2023-01-31 NOTE — Plan of Care (Signed)
  Problem: Education: Goal: Knowledge of General Education information will improve Description Including pain rating scale, medication(s)/side effects and non-pharmacologic comfort measures Outcome: Progressing   

## 2023-02-01 DIAGNOSIS — S72141A Displaced intertrochanteric fracture of right femur, initial encounter for closed fracture: Secondary | ICD-10-CM | POA: Diagnosis not present

## 2023-02-01 DIAGNOSIS — E871 Hypo-osmolality and hyponatremia: Secondary | ICD-10-CM | POA: Diagnosis not present

## 2023-02-01 DIAGNOSIS — S72001A Fracture of unspecified part of neck of right femur, initial encounter for closed fracture: Secondary | ICD-10-CM | POA: Diagnosis not present

## 2023-02-01 LAB — HEMOGLOBIN AND HEMATOCRIT, BLOOD
HCT: 27.7 % — ABNORMAL LOW (ref 36.0–46.0)
Hemoglobin: 9.7 g/dL — ABNORMAL LOW (ref 12.0–15.0)

## 2023-02-01 LAB — TYPE AND SCREEN
ABO/RH(D): A POS
Antibody Screen: NEGATIVE
Unit division: 0

## 2023-02-01 LAB — BPAM RBC
Blood Product Expiration Date: 202404182359
ISSUE DATE / TIME: 202403161136
Unit Type and Rh: 6200

## 2023-02-01 NOTE — Plan of Care (Signed)
  Problem: Education: Goal: Knowledge of General Education information will improve Description: Including pain rating scale, medication(s)/side effects and non-pharmacologic comfort measures 02/01/2023 0758 by Emmaline Life, RN Outcome: Progressing 01/31/2023 1811 by Emmaline Life, RN Outcome: Progressing

## 2023-02-01 NOTE — Progress Notes (Signed)
PROGRESS NOTE    [name]  [id number] DOB: 07-Jun-1956 DOA: 01/26/2023 PCP: [name], MD    Brief Narrative:   [name] is a 67 y.o. female with past medical history significant for HTN, HLD, COPD, CAD, history of CVA, history of left breast cancer, polysubstance abuse with EtOH, cocaine who presented to [facility] ED on 3/11 via EMS from home after fall at home with complaints of right hip pain.  Patient denied hitting her head or loss of consciousness.  Endorses Plavix use but denies any other anticoagulant.  In the ED, temperature 97.6 F, HR 57, RR 18, BP 140/70, SpO2 100% on room air.  WBC 15.0, hemoglobin 11.1, platelets 300.  Sodium 132, potassium 3.5, chloride 100, CO2 27, glucose 123, BUN 13, creatinine 0.86.  Magnesium 2.1.  Chest x-ray with no active cardiopulmonary disease process.  Right hip/pelvis x-ray with acute proximal right femur fracture.  CT right hip with comminuted, displaced and angulated intertrochanteric fracture of the right femur.  Orthopedics was consulted.  TRH consulted for admission and further evaluation and management of acute right hip fracture.  Assessment & Plan:   Acute comminuted/displaced/angulated intertrochanteric right femur fracture Patient presenting to ED following fall at home with associated right hip pain and inability to ambulate.  Imaging on admission notable for comminuted, displaced, angulated intertrochanteric fracture of the right femur.  Orthopedics was consulted and patient underwent cephalic medullary nailing by Dr. Amedeo Kinsman on 01/29/2023. -- Orthopedics following, appreciate assistance -- Oxycodone 5 mg p.o. every 4 hours for moderate pain -- Dilaudid 0.5 mg IV every 3 hours as needed severe pain -- Robaxin 500 mg IV/p.o. every 6 hours muscle spasms -- Will plan to restart Plavix 75mg  PO daily tomorrow if hemoglobin remained stable -- PT/OT recommends SNF but patient declining and would like to return home; also  would be difficult placement given her active substance abuse per TOC -- Continue therapy efforts while inpatient  Acute blood loss anemia Hemoglobin 11.1 on admission, has trended down to 7.4 today.  Likely in the setting of long bone fracture.  Anemia panel with iron 22, TIBC 232, ferritin 187, B12 560, folate 8.4.  Transfuse 1 unit PRBC on 3/16 and 1u pRBC on 3/17. -- Hgb 11.1>9.7>7.4>5.6>8.1>7.6>9.7 -- Transfuse 1 unit PRBC today; repeat H/H 2 hours following completion of transfusion -- Plan to restart Plavix tomorrow if hemoglobin remained stable -- Repeat CBC in a.m.  Vitamin D deficiency Vitamin D 25-hydroxy level low at 19.70. -- Drisdol 50,000 units p.o. q. 7 days  Essential hypertension -- Hold home lisinopril/HCTZ, carvedilol, amlodipine for now -- Continue statin -- Holding Plavix as above -- Continue monitor BP closely  Hyperlipidemia -- Atorvastatin 40 mg p.o. daily  COPD -- Albuterol neb every 4 hours.  Wheezing/shortness of breath  CAD Hx CVA -- Continue statin -- Holding Plavix as above  Polysubstance abuse Patient with known history of alcohol, cocaine abuse.  UDS on admission positive for opiates, THC, cocaine.  Counseled on need for complete cessation/abstinence. -- CIWA protocol with symptom triggered Ativan -- Thiamine, folate, multivitamin -- TOC consult for substance abuse resources  Severe protein calorie malnutrition Body mass index is 16.65 kg/m.  As noted with significant muscle wasting, flat depletion noted on physical exam. Nutrition Status: Nutrition Problem: Inadequate oral intake Etiology: lethargy/confusion, acute illness Signs/Symptoms: meal completion < 25% Interventions: Ensure Enlive (each supplement provides 350kcal and 20 grams of protein), Magic cup, MVI -- Dietitian following, encourage increased oral  intake, supplementation    DVT prophylaxis: Place and maintain sequential compression device Start: 01/27/23 0746 SCDs Start:  01/27/23 0708    Code Status: Full Code Family Communication: No family present at bedside this morning  Disposition Plan:  Level of care: Med-Surg Status is: Inpatient Remains inpatient appropriate because: will be likely difficult to place in SNF given her active cocaine use per TOC; and patient desires to return home with home health.  Anticipate likely discharge on tomorrow.  Plan to restart Plavix tomorrow if hemoglobin remained stable.    Consultants:  Orthopedics  Procedures:  Cephalomedullary nail right intertrochanteric femur fracture, Dr. Amedeo Kinsman; 01/29/2023  Antimicrobials:  Perioperative cefazolin   Subjective: Patient seen examined bedside, resting comfortably, lying in bed.  Sleeping but easily arousable.  Hemoglobin up to 9.7 today.  Discussed with patient needs to continue therapy efforts while inpatient with likely anticipated discharge with home health services tomorrow.  Denies headache, no chest pain, no shortness of breath, no abdominal pain.  No acute events overnight per nursing staff.  Objective: Vitals:   01/31/23 1337 01/31/23 1521 01/31/23 2124 02/01/23 0509  BP: (!) 148/75 (!) 141/86 (!) 153/84 (!) 164/90  Pulse: 76 77 73 74  Resp: 18 16 18 16   Temp: 98.8 F (37.1 C) 98.6 F (37 C) 98.9 F (37.2 C) 98.2 F (36.8 C)  TempSrc: Oral Oral Oral Oral  SpO2: 100% 99% 100% 100%  Weight:      Height:        Intake/Output Summary (Last 24 hours) at 02/01/2023 1159 Last data filed at 02/01/2023 1136 Gross per 24 hour  Intake 1890 ml  Output 400 ml  Net 1490 ml   Filed Weights   01/26/23 2335  Weight: 44 kg    Examination:  Physical Exam: GEN: NAD, alert, chronically ill/cachectic in appearance, appears older than stated age HEENT: NCAT, PERRL, EOMI, sclera clear, MMM PULM: CTAB w/o wheezes/crackles, normal respiratory effort, on room air CV: RRR w/o M/G/R GI: abd soft, NTND, NABS, no R/G/M MSK: no peripheral edema, right hip with surgical  dressing in place, clean/dry/intact, no concerning areas of fluctuance/erythema, ecchymosis. Neurovascularly intact NEURO: CN II-XII intact, no focal deficits, sensation to light touch intact PSYCH: Depressed mood, flat affect Integumentary: Right hip operative site as above, otherwise no concerning rashes/lesions/wounds noted on exposed skin surfaces    Data Reviewed: I have personally reviewed following labs and imaging studies  CBC: Recent Labs  Lab 01/27/23 0202 01/28/23 0432 01/29/23 0440 01/30/23 0535 01/30/23 1721 01/31/23 0652 02/01/23 0517  WBC 15.0* 9.0 11.9* 12.3*  --  11.9*  --   NEUTROABS 12.9*  --   --   --   --   --   --   HGB 11.1* 9.7* 7.4* 5.6* 8.1* 7.6* 9.7*  HCT 32.6* 28.2* 22.0* 16.6* 23.7* 22.8* 27.7*  MCV 86.0 84.7 87.0 87.8  --  87.4  --   PLT 300 270 258 211  --  262  --    Basic Metabolic Panel: Recent Labs  Lab 01/27/23 0202 01/27/23 0708 01/28/23 0432 01/29/23 0440 01/30/23 0535 01/31/23 0652  NA 132*  --  134* 137 140 137  K 3.5  --  3.7 3.9 3.9 3.8  CL 100  --  100 105 107 103  CO2 27  --  26 21* 25 26  GLUCOSE 123*  --  110* 101* 110* 97  BUN 13  --  19 44* 22 14  CREATININE 0.86  --  1.09* 0.93 0.85 0.78  CALCIUM 9.5  --  9.1 8.9 8.9 8.7*  MG  --  2.1  --  2.2  --   --   PHOS  --  3.6  --   --   --   --    GFR: Estimated Creatinine Clearance: 46.8 mL/min (by C-G formula based on SCr of 0.78 mg/dL). Liver Function Tests: Recent Labs  Lab 01/28/23 0432  AST 16  ALT 16  ALKPHOS 39  BILITOT 0.6  PROT 6.5  ALBUMIN 3.3*   No results for input(s): "LIPASE", "AMYLASE" in the last 168 hours. No results for input(s): "AMMONIA" in the last 168 hours. Coagulation Profile: Recent Labs  Lab 01/27/23 0202  INR 1.1   Cardiac Enzymes: No results for input(s): "CKTOTAL", "CKMB", "CKMBINDEX", "TROPONINI" in the last 168 hours. BNP (last 3 results) No results for input(s): "PROBNP" in the last 8760 hours. HbA1C: No results for  input(s): "HGBA1C" in the last 72 hours. CBG: No results for input(s): "GLUCAP" in the last 168 hours. Lipid Profile: No results for input(s): "CHOL", "HDL", "LDLCALC", "TRIG", "CHOLHDL", "LDLDIRECT" in the last 72 hours. Thyroid Function Tests: No results for input(s): "TSH", "T4TOTAL", "FREET4", "T3FREE", "THYROIDAB" in the last 72 hours. Anemia Panel: Recent Labs    01/29/23 1209  VITAMINB12 560  FOLATE 8.4  FERRITIN 187  TIBC 232*  IRON 22*  RETICCTPCT 1.3   Sepsis Labs: No results for input(s): "PROCALCITON", "LATICACIDVEN" in the last 168 hours.  Recent Results (from the past 240 hour(s))  MRSA Next Gen by PCR, Nasal     Status: None   Collection Time: 01/27/23  5:10 PM   Specimen: Nasal Mucosa; Nasal Swab  Result Value Ref Range Status   MRSA by PCR Next Gen NOT DETECTED NOT DETECTED Final    Comment: (NOTE) The GeneXpert MRSA Assay (FDA approved for NASAL specimens only), is one component of a comprehensive MRSA colonization surveillance program. It is not intended to diagnose MRSA infection nor to guide or monitor treatment for MRSA infections. Test performance is not FDA approved in patients less than 88 years old. Performed at [facility], [address] [place], [place] [address]          Radiology Studies: No results found.      Scheduled Meds:  atorvastatin  40 mg Oral Daily   Chlorhexidine Gluconate Cloth  6 each Topical Daily   feeding supplement  237 mL Oral BID BM   folic acid  1 mg Oral Daily   multivitamin with minerals  1 tablet Oral Daily   polyethylene glycol  17 g Oral BID   thiamine  100 mg Oral Daily   Or   thiamine  100 mg Intravenous Daily   Vitamin D (Ergocalciferol)  50,000 Units Oral Q7 days   Continuous Infusions:     LOS: 5 days    Time spent: 48 minutes spent on chart review, discussion with nursing staff, consultants, updating family and interview/physical exam; more than 50% of that time was spent in counseling  and/or coordination of care.    Kinley Ferrentino J [place] DO [facility] Available via Epic secure chat 7am-7pm After these hours, please refer to coverage provider listed on [email or web address] 02/01/2023, 11:59 AM

## 2023-02-02 DIAGNOSIS — E871 Hypo-osmolality and hyponatremia: Secondary | ICD-10-CM | POA: Diagnosis not present

## 2023-02-02 DIAGNOSIS — S72001A Fracture of unspecified part of neck of right femur, initial encounter for closed fracture: Secondary | ICD-10-CM | POA: Diagnosis not present

## 2023-02-02 DIAGNOSIS — S72141A Displaced intertrochanteric fracture of right femur, initial encounter for closed fracture: Secondary | ICD-10-CM | POA: Diagnosis not present

## 2023-02-02 LAB — HEMOGLOBIN AND HEMATOCRIT, BLOOD
HCT: 28.1 % — ABNORMAL LOW (ref 36.0–46.0)
Hemoglobin: 9.3 g/dL — ABNORMAL LOW (ref 12.0–15.0)

## 2023-02-02 MED ORDER — CLOPIDOGREL BISULFATE 75 MG PO TABS
75.0000 mg | ORAL_TABLET | Freq: Every day | ORAL | Status: DC
  Administered 2023-02-02 – 2023-02-03 (×2): 75 mg via ORAL
  Filled 2023-02-02 (×2): qty 1

## 2023-02-02 NOTE — Progress Notes (Signed)
PROGRESS NOTE    [name]  [id number] DOB: 11/23/1955 DOA: 01/26/2023 PCP: [name], MD    Brief Narrative:   [name] is a 67 y.o. female with past medical history significant for HTN, HLD, COPD, CAD, history of CVA, history of left breast cancer, polysubstance abuse with EtOH, cocaine who presented to [facility] ED on 3/11 via EMS from home after fall at home with complaints of right hip pain.  Patient denied hitting her head or loss of consciousness.  Endorses Plavix use but denies any other anticoagulant.  In the ED, temperature 97.6 F, HR 57, RR 18, BP 140/70, SpO2 100% on room air.  WBC 15.0, hemoglobin 11.1, platelets 300.  Sodium 132, potassium 3.5, chloride 100, CO2 27, glucose 123, BUN 13, creatinine 0.86.  Magnesium 2.1.  Chest x-ray with no active cardiopulmonary disease process.  Right hip/pelvis x-ray with acute proximal right femur fracture.  CT right hip with comminuted, displaced and angulated intertrochanteric fracture of the right femur.  Orthopedics was consulted.  TRH consulted for admission and further evaluation and management of acute right hip fracture.  Assessment & Plan:   Acute comminuted/displaced/angulated intertrochanteric right femur fracture Patient presenting to ED following fall at home with associated right hip pain and inability to ambulate.  Imaging on admission notable for comminuted, displaced, angulated intertrochanteric fracture of the right femur.  Orthopedics was consulted and patient underwent cephalic medullary nailing by Dr. Amedeo Kinsman on 01/29/2023. -- WBAT LLE -- Oxycodone 5 mg p.o. every 4 hours for moderate pain -- Dilaudid 0.5 mg IV every 3 hours as needed severe pain -- Robaxin 500 mg IV/p.o. every 6 hours muscle spasms -- restart Plavix 75mg  PO daily today -- PT/OT recommends SNF but patient declining and would like to return home; also would be difficult placement given her active substance abuse per TOC -- Continue  therapy efforts while inpatient; anticipate discharge home tomorrow -- Outpatient follow-up with orthopedics, Dr. Amedeo Kinsman 2 weeks  Acute blood loss anemia Hemoglobin 11.1 on admission, has trended down to 7.4 today.  Likely in the setting of long bone fracture.  Anemia panel with iron 22, TIBC 232, ferritin 187, B12 560, folate 8.4.  Transfuse 1 unit PRBC on 3/16 and 1u pRBC on 3/17. -- Hgb 11.1>9.7>7.4>5.6>8.1>7.6>9.7>9.3; stable -- Repeat CBC in a.m.  Vitamin D deficiency Vitamin D 25-hydroxy level low at 19.70. -- Drisdol 50,000 units p.o. q. 7 days  Essential hypertension -- Hold home lisinopril/HCTZ, carvedilol, amlodipine for now -- Continue statin -- Holding Plavix as above -- Continue monitor BP closely  Hyperlipidemia -- Atorvastatin 40 mg p.o. daily  COPD -- Albuterol neb every 4 hours.  Wheezing/shortness of breath  CAD Hx CVA -- Continue statin -- Restart Plavix as above  Polysubstance abuse Patient with known history of alcohol, cocaine abuse.  UDS on admission positive for opiates, THC, cocaine.  Counseled on need for complete cessation/abstinence. -- CIWA protocol with symptom triggered Ativan -- Thiamine, folate, multivitamin -- TOC consult for substance abuse resources  Severe protein calorie malnutrition Body mass index is 16.65 kg/m.  As noted with significant muscle wasting, flat depletion noted on physical exam. Nutrition Status: Nutrition Problem: Inadequate oral intake Etiology: lethargy/confusion, acute illness Signs/Symptoms: meal completion < 25% Interventions: Ensure Enlive (each supplement provides 350kcal and 20 grams of protein), Magic cup, MVI -- Dietitian following, encourage increased oral intake, supplementation    DVT prophylaxis: Place and maintain sequential compression device Start: 01/27/23 0746 SCDs Start: 01/27/23  0708    Code Status: Full Code Family Communication: No family present at bedside this morning  Disposition  Plan:  Level of care: Med-Surg Status is: Inpatient Remains inpatient appropriate because: Unable to place due to active cocaine abuse per [facility], continue therapy efforts while inpatient, anticipate discharge home with home health tomorrow    Consultants:  Orthopedics  Procedures:  Cephalomedullary nail right intertrochanteric femur fracture, Dr. Amedeo Kinsman; 01/29/2023  Antimicrobials:  Perioperative cefazolin   Subjective: Patient seen examined bedside, resting comfortably, lying in bed.  No specific complaints this morning other than some slight discomfort to right hip.  Working with physical and Occupational Therapy this morning.  Discussed anticipate likely discharge home tomorrow with home health.  Therapy would like family to come to the hospital to see her current needs.  Hemoglobin remained stable, 9.3 this morning, starting Plavix.  Denies headache, no chest pain, no shortness of breath, no abdominal pain.  No acute events overnight per nursing staff.  Objective: Vitals:   02/01/23 0509 02/01/23 1545 02/01/23 1556 02/01/23 2028  BP: (!) 164/90 131/76 126/61 117/73  Pulse: 74 73 64 84  Resp: 16 20 20 18   Temp: 98.2 F (36.8 C) 99.2 F (37.3 C) 98.7 F (37.1 C) 98.6 F (37 C)  TempSrc: Oral Oral Oral Oral  SpO2: 100% 100% 96% 100%  Weight:      Height:        Intake/Output Summary (Last 24 hours) at 02/02/2023 1140 Last data filed at 02/02/2023 0620 Gross per 24 hour  Intake 930 ml  Output 200 ml  Net 730 ml   Filed Weights   01/26/23 2335  Weight: 44 kg    Examination:  Physical Exam: GEN: NAD, alert, chronically ill/cachectic in appearance, appears older than stated age HEENT: NCAT, PERRL, EOMI, sclera clear, MMM PULM: CTAB w/o wheezes/crackles, normal respiratory effort, on room air CV: RRR w/o M/G/R GI: abd soft, NTND, NABS, no R/G/M MSK: no peripheral edema, right hip with surgical dressing in place, clean/dry/intact, no concerning areas of  fluctuance/erythema, ecchymosis. Neurovascularly intact NEURO: CN II-XII intact, no focal deficits, sensation to light touch intact PSYCH: Depressed mood, flat affect Integumentary: Right hip operative site as above, otherwise no concerning rashes/lesions/wounds noted on exposed skin surfaces    Data Reviewed: I have personally reviewed following labs and imaging studies  CBC: Recent Labs  Lab 01/27/23 0202 01/28/23 0432 01/29/23 0440 01/30/23 0535 01/30/23 1721 01/31/23 0652 02/01/23 0517 02/02/23 0449  WBC 15.0* 9.0 11.9* 12.3*  --  11.9*  --   --   NEUTROABS 12.9*  --   --   --   --   --   --   --   HGB 11.1* 9.7* 7.4* 5.6* 8.1* 7.6* 9.7* 9.3*  HCT 32.6* 28.2* 22.0* 16.6* 23.7* 22.8* 27.7* 28.1*  MCV 86.0 84.7 87.0 87.8  --  87.4  --   --   PLT 300 270 258 211  --  262  --   --    Basic Metabolic Panel: Recent Labs  Lab 01/27/23 0202 01/27/23 0708 01/28/23 0432 01/29/23 0440 01/30/23 0535 01/31/23 0652  NA 132*  --  134* 137 140 137  K 3.5  --  3.7 3.9 3.9 3.8  CL 100  --  100 105 107 103  CO2 27  --  26 21* 25 26  GLUCOSE 123*  --  110* 101* 110* 97  BUN 13  --  19 44* 22 14  CREATININE 0.86  --  1.09* 0.93 0.85 0.78  CALCIUM 9.5  --  9.1 8.9 8.9 8.7*  MG  --  2.1  --  2.2  --   --   PHOS  --  3.6  --   --   --   --    GFR: Estimated Creatinine Clearance: 46.8 mL/min (by C-G formula based on SCr of 0.78 mg/dL). Liver Function Tests: Recent Labs  Lab 01/28/23 0432  AST 16  ALT 16  ALKPHOS 39  BILITOT 0.6  PROT 6.5  ALBUMIN 3.3*   No results for input(s): "LIPASE", "AMYLASE" in the last 168 hours. No results for input(s): "AMMONIA" in the last 168 hours. Coagulation Profile: Recent Labs  Lab 01/27/23 0202  INR 1.1   Cardiac Enzymes: No results for input(s): "CKTOTAL", "CKMB", "CKMBINDEX", "TROPONINI" in the last 168 hours. BNP (last 3 results) No results for input(s): "PROBNP" in the last 8760 hours. HbA1C: No results for input(s): "HGBA1C"  in the last 72 hours. CBG: No results for input(s): "GLUCAP" in the last 168 hours. Lipid Profile: No results for input(s): "CHOL", "HDL", "LDLCALC", "TRIG", "CHOLHDL", "LDLDIRECT" in the last 72 hours. Thyroid Function Tests: No results for input(s): "TSH", "T4TOTAL", "FREET4", "T3FREE", "THYROIDAB" in the last 72 hours. Anemia Panel: No results for input(s): "VITAMINB12", "FOLATE", "FERRITIN", "TIBC", "IRON", "RETICCTPCT" in the last 72 hours.  Sepsis Labs: No results for input(s): "PROCALCITON", "LATICACIDVEN" in the last 168 hours.  Recent Results (from the past 240 hour(s))  MRSA Next Gen by PCR, Nasal     Status: None   Collection Time: 01/27/23  5:10 PM   Specimen: Nasal Mucosa; Nasal Swab  Result Value Ref Range Status   MRSA by PCR Next Gen NOT DETECTED NOT DETECTED Final    Comment: (NOTE) The GeneXpert MRSA Assay (FDA approved for NASAL specimens only), is one component of a comprehensive MRSA colonization surveillance program. It is not intended to diagnose MRSA infection nor to guide or monitor treatment for MRSA infections. Test performance is not FDA approved in patients less than 88 years old. Performed at [facility], [address], [place],  [address]          Radiology Studies: No results found.      Scheduled Meds:  atorvastatin  40 mg Oral Daily   Chlorhexidine Gluconate Cloth  6 each Topical Daily   clopidogrel  75 mg Oral Daily   feeding supplement  237 mL Oral BID BM   folic acid  1 mg Oral Daily   multivitamin with minerals  1 tablet Oral Daily   polyethylene glycol  17 g Oral BID   thiamine  100 mg Oral Daily   Or   thiamine  100 mg Intravenous Daily   Vitamin D (Ergocalciferol)  50,000 Units Oral Q7 days   Continuous Infusions:     LOS: 6 days    Time spent: 48 minutes spent on chart review, discussion with nursing staff, consultants, updating family and interview/physical exam; more than 50% of that time was spent in  counseling and/or coordination of care.    Mykaela Arena J [place] DO [facility] Available via Epic secure chat 7am-7pm After these hours, please refer to coverage provider listed on [email or web address] 02/02/2023, 11:40 AM

## 2023-02-02 NOTE — TOC Progression Note (Signed)
Transition of Care [facility]) - Progression Note    Patient Details  Name: [name] MRN: [id number] Date of Birth: [birth year]  Transition of [facility]) CM/SW Contact  Shade Flood, LCSW Phone Number: 02/02/2023, 9:48 AM  Clinical Narrative:     TOC following. Anticipating dc home tomorrow. OT recommending 3in1 for dc. Will discuss with pt and order if pt desires.   SDOH resource lists provided to patient.   Expected Discharge Plan: [place] Barriers to Discharge: Continued Medical Work up  Expected Discharge Plan and Services       Living arrangements for the past 2 months: Single Family Home                           HH Arranged: RN, PT, Nurse's Aide [place] Agency: [place] ([facility]) Date HH Agency Contacted: 01/29/23 Time [place]: 1239 Representative spoke with at [place]: [place] ([place]) Interventions SDOH Screenings   Food Insecurity: Food Insecurity Present (01/27/2023)  Housing: Low Risk  (01/27/2023)  Transportation Needs: Unmet Transportation Needs (01/27/2023)  Utilities: Not At Risk (01/27/2023)  Depression (PHQ2-9): Medium Risk (12/14/2019)  Tobacco Use: High Risk (01/29/2023)    Readmission Risk Interventions     No data to display

## 2023-02-02 NOTE — Progress Notes (Signed)
Physical Therapy Treatment Patient Details Name: [name] MRN: [id number] DOB: October 18, 1956 Today's Date: 02/02/2023   History of Present Illness [name] is a 67 y.o. female s/p Cephalomedullary nail for Right intertrochanteric femur fracture on 01/29/23, with medical history significant of hypertension, hyperlipidemia, COPD, coronary artery disease, stroke, left breast cancer who presents to the emergency department due to a fall sustained at home.  Patient complained of right hip pain, she denies hitting her head, LOC.  She takes Plavix, but denies use of any anticoagulant.    PT Comments    Patient demonstrates improvement for moving RLE during bed mobility, increased endurance distance for gait training with slow labored movement without loss of balance, but requires repeated verbal/tactile cueing for safety possibly due to poor eyesight.  Patient tolerated sitting up in chair after therapy - nursing staff aware.  Patient will benefit from continued skilled physical therapy in hospital and recommended venue below to increase strength, balance, endurance for safe ADLs and gait.     Recommendations for follow up therapy are one component of a multi-disciplinary discharge planning process, led by the attending physician.  Recommendations may be updated based on patient status, additional functional criteria and insurance authorization.  Follow Up Recommendations  Skilled nursing-short term rehab (<3 hours/day) Can patient physically be transported by private vehicle: No   Assistance Recommended at Discharge    Patient can return home with the following A lot of help with bathing/dressing/bathroom;A lot of help with walking and/or transfers;Help with stairs or ramp for entrance;Assistance with cooking/housework   Equipment Recommendations  None recommended by PT    Recommendations for Other Services       Precautions / Restrictions Precautions Precautions:  Fall Restrictions Weight Bearing Restrictions: Yes RLE Weight Bearing: Weight bearing as tolerated     Mobility  Bed Mobility Overal bed mobility: Needs Assistance Bed Mobility: Supine to Sit     Supine to sit: Min guard, Min assist     General bed mobility comments: increased time, labored movement    Transfers Overall transfer level: Needs assistance Equipment used: Rolling walker (2 wheels) Transfers: Sit to/from Stand, Bed to chair/wheelchair/BSC Sit to Stand: Min guard, Min assist   Step pivot transfers: Min guard, Min assist       General transfer comment: increased time, required repeated verbal/tactile cueing mostly due to poor eye sight    Ambulation/Gait Ambulation/Gait assistance: Min assist, Mod assist Gait Distance (Feet): 22 Feet Assistive device: Rolling walker (2 wheels) Gait Pattern/deviations: Decreased step length - right, Decreased step length - left, Decreased stride length, Antalgic, Decreased stance time - right Gait velocity: decreased     General Gait Details: demonstrates increased endurance/distance for gait training with slow labored unsteady movement and limited mostly due to fatigue, increasing right hip pain   Stairs             Wheelchair Mobility    Modified Rankin (Stroke Patients Only)       Balance Overall balance assessment: Needs assistance Sitting-balance support: Feet supported, No upper extremity supported Sitting balance-Leahy Scale: Fair Sitting balance - Comments: seated at EOB   Standing balance support: During functional activity, Bilateral upper extremity supported, Reliant on assistive device for balance Standing balance-Leahy Scale: Poor Standing balance comment: fair/poor using RW                            Cognition Arousal/Alertness: Awake/alert Behavior During Therapy: [facility] for  tasks assessed/performed Overall Cognitive Status: Within Functional Limits for tasks assessed                                           Exercises      General Comments        Pertinent Vitals/Pain Pain Assessment Pain Assessment: Faces Faces Pain Scale: Hurts even more Pain Location: right hip with movement Pain Descriptors / Indicators: Guarding, Grimacing Pain Intervention(s): Premedicated before session, Limited activity within patient's tolerance, Monitored during session, Patient requesting pain meds-RN notified    Home Living Family/patient expects to be discharged to:: Private residence Living Arrangements: Other relatives Available Help at Discharge: Family;Available PRN/intermittently;Personal care attendant Type of Home: House Home Access: Ramped entrance       Home Layout: One level Home Equipment: Shower seat;Grab bars - tub/shower;Wheelchair Probation officer (4 wheels) Additional Comments: per PT note; pt did report ability to have 24/7 assist from niece or son. Pt also reported having a care attendant 7 days a week for 4 to 6 hours.    Prior Function            PT Goals (current goals can now be found in the care plan section) Acute Rehab PT Goals Patient Stated Goal: return home with family to assist PT Goal Formulation: With patient Time For Goal Achievement: 02/13/23 Potential to Achieve Goals: Good Progress towards PT goals: Progressing toward goals    Frequency    Min 3X/week      PT Plan Current plan remains appropriate    Co-evaluation PT/OT/SLP Co-Evaluation/Treatment: Yes Reason for Co-Treatment: To address functional/ADL transfers PT goals addressed during session: Mobility/safety with mobility;Balance;Proper use of DME OT goals addressed during session: ADL's and self-care      AM-PAC PT "6 Clicks" Mobility   Outcome Measure  Help needed turning from your back to your side while in a flat bed without using bedrails?: A Lot Help needed moving from lying on your back to sitting on the side of a flat bed without using  bedrails?: A Little Help needed moving to and from a bed to a chair (including a wheelchair)?: A Lot Help needed standing up from a chair using your arms (e.g., wheelchair or bedside chair)?: A Lot Help needed to walk in hospital room?: A Lot Help needed climbing 3-5 steps with a railing? : A Lot 6 Click Score: 13    End of Session   Activity Tolerance: Patient tolerated treatment well;Patient limited by fatigue;Patient limited by pain Patient left: in chair;with call bell/phone within reach Nurse Communication: Mobility status PT Visit Diagnosis: Unsteadiness on feet (R26.81);Other abnormalities of gait and mobility (R26.89);Muscle weakness (generalized) (M62.81)     Time: [id number] PT Time Calculation (min) (ACUTE ONLY): 24 min  Charges:  $Therapeutic Activity: 23-37 mins                     12:10 PM, 02/02/23 Lonell Grandchild, MPT Physical Therapist with [facility] [phone] office [phone] mobile phone

## 2023-02-02 NOTE — Progress Notes (Signed)
Due to hip fracture and recent surgery, upon returning home beneficiary will be confined to a single room and will need 3in1 commode.

## 2023-02-02 NOTE — Plan of Care (Signed)
  Problem: Acute Rehab OT Goals (only OT should resolve) Goal: Pt. Will Perform Grooming Flowsheets (Taken 02/02/2023 0917) Pt Will Perform Grooming:  with modified independence  sitting Goal: Pt. Will Perform Lower Body Bathing Flowsheets (Taken 02/02/2023 0917) Pt Will Perform Lower Body Bathing:  with min guard assist  with adaptive equipment  sitting/lateral leans Goal: Pt. Will Perform Upper Body Dressing Flowsheets (Taken 02/02/2023 0917) Pt Will Perform Upper Body Dressing:  with modified independence  sitting Goal: Pt. Will Perform Lower Body Dressing Flowsheets (Taken 02/02/2023 0917) Pt Will Perform Lower Body Dressing:  with min guard assist  with adaptive equipment  sitting/lateral leans Goal: Pt. Will Transfer To Toilet Flowsheets (Taken 02/02/2023 [phone]) Pt Will Transfer to Toilet:  with supervision  ambulating Goal: Pt. Will Perform Toileting-Clothing Manipulation Flowsheets (Taken 02/02/2023 0917) Pt Will Perform Toileting - Clothing Manipulation and hygiene:  with modified independence  sitting/lateral leans Goal: Pt/Caregiver Will Perform Home Exercise Program Flowsheets (Taken 02/02/2023 [phone]) Pt/caregiver will Perform Home Exercise Program:  Increased strength  Both right and left upper extremity  Independently  Jenella Craigie OT, MOT

## 2023-02-02 NOTE — Evaluation (Signed)
Occupational Therapy Evaluation Patient Details Name: NITZA BACIK MRN: [id number] DOB: 1956-10-20 Today's Date: 02/02/2023   History of Present Illness LISSANDRA GRADOWSKI is a 67 y.o. female s/p Cephalomedullary nail for Right intertrochanteric femur fracture on 01/29/23, with medical history significant of hypertension, hyperlipidemia, COPD, coronary artery disease, stroke, left breast cancer who presents to the emergency department due to a fall sustained at home.  Patient complained of right hip pain, she denies hitting her head, LOC.  She takes Plavix, but denies use of any anticoagulant.   Clinical Impression   Pt agreeable to OT and PT co-evaluation. Pt reports that son or niece may be able to provide 24/7 assistance for a short period. Pt is independent for ADL's at baseline. Today pt required min G to min A for bed mobility and ambulation within the room with RW. Pt requires much time and labored effort to ambulate. Cuing also needed to navigate room due to pt reporting severe vision impairment at baseline. Pt reportedly is not interested in SNF placement and prefers to go home. BSC recommended if pt is going home rather than to SNF. Pt will benefit from continued OT in the hospital and recommended venue below to increase strength, balance, and endurance for safe ADL's.        Recommendations for follow up therapy are one component of a multi-disciplinary discharge planning process, led by the attending physician.  Recommendations may be updated based on patient status, additional functional criteria and insurance authorization.   Follow Up Recommendations  Skilled nursing-short term rehab (<3 hours/day)     Assistance Recommended at Discharge Intermittent Supervision/Assistance  Patient can return home with the following A little help with walking and/or transfers;A lot of help with bathing/dressing/bathroom;Assistance with cooking/housework;Assist for transportation;Help with stairs or ramp  for entrance    Functional Status Assessment  Patient has had a recent decline in their functional status and demonstrates the ability to make significant improvements in function in a reasonable and predictable amount of time.  Equipment Recommendations  BSC/3in1           Precautions / Restrictions Precautions Precautions: Fall Restrictions Weight Bearing Restrictions: Yes RLE Weight Bearing: Weight bearing as tolerated      Mobility Bed Mobility Overal bed mobility: Needs Assistance Bed Mobility: Supine to Sit     Supine to sit: Min guard, Min assist     General bed mobility comments: increased time, labored movement    Transfers Overall transfer level: Needs assistance Equipment used: Rolling walker (2 wheels) Transfers: Sit to/from Stand, Bed to chair/wheelchair/BSC Sit to Stand: Min guard, Min assist     Step pivot transfers: Min guard, Min assist     General transfer comment: slow labored movement; verbal and tactile cuing for use of RW and navigating room.      Balance Overall balance assessment: Needs assistance Sitting-balance support: Feet supported, No upper extremity supported Sitting balance-Leahy Scale: Fair Sitting balance - Comments: seated at EOB   Standing balance support: During functional activity, Bilateral upper extremity supported, Reliant on assistive device for balance Standing balance-Leahy Scale: Poor Standing balance comment: fair/poor using RW                           ADL either performed or assessed with clinical judgement   ADL Overall ADL's : Needs assistance/impaired Eating/Feeding: Set up;Modified independent;Sitting   Grooming: Supervision/safety;Sitting   Upper Body Bathing: Supervision/ safety;Sitting   Lower Body Bathing:  Maximal assistance;Sitting/lateral leans   Upper Body Dressing : Supervision/safety;Sitting   Lower Body Dressing: Maximal assistance;Sitting/lateral leans   Toilet Transfer:  Minimal assistance;Stand-pivot;Rolling walker (2 wheels) Toilet Transfer Details (indicate cue type and reason): Simulated via EOB to chair Toileting- Clothing Manipulation and Hygiene: Minimal assistance;Moderate assistance;Sitting/lateral lean       Functional mobility during ADLs: Min guard;Minimal assistance;Rolling walker (2 wheels) General ADL Comments: Pt able to ambulate to door and back to chair.     Vision Baseline Vision/History: 4 Cataracts;3 Glaucoma Ability to See in Adequate Light: 3 Highly impaired Patient Visual Report: No change from baseline Vision Assessment?: No apparent visual deficits Additional Comments: Pt reported being blind for last 6 years. Highly impaired at baseline.                Pertinent Vitals/Pain Pain Assessment Pain Assessment: Faces Faces Pain Scale: Hurts even more Pain Location: right hip with movement Pain Descriptors / Indicators: Guarding, Grimacing Pain Intervention(s): Limited activity within patient's tolerance, Monitored during session, Repositioned, RN gave pain meds during session     Hand Dominance Right   Extremity/Trunk Assessment Upper Extremity Assessment Upper Extremity Assessment: Generalized weakness (R UE 3+/5 grossly from previous stroke. Decreased fine motor skills at baseline in R UE. L UE generally weak.)   Lower Extremity Assessment Lower Extremity Assessment: Defer to PT evaluation   Cervical / Trunk Assessment Cervical / Trunk Assessment: Normal   Communication Communication Communication: No difficulties   Cognition Arousal/Alertness: Awake/alert Behavior During Therapy: WFL for tasks assessed/performed Overall Cognitive Status: Within Functional Limits for tasks assessed                                                        Home Living Family/patient expects to be discharged to:: Private residence Living Arrangements: Other relatives Available Help at Discharge:  Family;Available PRN/intermittently;Personal care attendant Type of Home: House Home Access: Ramped entrance     Home Layout: One level     Bathroom Shower/Tub: Occupational psychologist: Handicapped height Bathroom Accessibility: Yes   Home Equipment: Shower seat;Grab bars - tub/shower;Wheelchair Probation officer (4 wheels)   Additional Comments: per PT note; pt did report ability to have 24/7 assist from niece or son. Pt also reported having a care attendant 7 days a week for 4 to 6 hours.      Prior Functioning/Environment Prior Level of Function : Needs assist             Mobility Comments: household and short distanced community ambulator with Rollator PRN ADLs Comments: Independent ADL: assist IADL        OT Problem List: Decreased strength;Decreased activity tolerance;Impaired balance (sitting and/or standing);Decreased range of motion;Pain      OT Treatment/Interventions: Self-care/ADL training;Therapeutic exercise;Therapeutic activities;Patient/family education;Balance training;Visual/perceptual remediation/compensation;DME and/or AE instruction    OT Goals(Current goals can be found in the care plan section) Acute Rehab OT Goals Patient Stated Goal: return home OT Goal Formulation: With patient Time For Goal Achievement: 02/16/23 Potential to Achieve Goals: Good  OT Frequency: Min 2X/week    Co-evaluation PT/OT/SLP Co-Evaluation/Treatment: Yes Reason for Co-Treatment: To address functional/ADL transfers   OT goals addressed during session: ADL's and self-care  End of Session Equipment Utilized During Treatment: Rolling walker (2 wheels)  Activity Tolerance: Patient tolerated treatment well Patient left: in chair;with call bell/phone within reach;with chair alarm set  OT Visit Diagnosis: Unsteadiness on feet (R26.81);Other abnormalities of gait and mobility (R26.89);Muscle weakness (generalized) (M62.81);History of  falling (Z91.81)                [id number] OT Time Calculation (min): 21 min Charges:  OT General Charges $OT Visit: 1 Visit OT Evaluation $OT Eval Low Complexity: 1 Low  Taelon Bendorf OT, MOT   Larey Seat 02/02/2023, 9:15 AM

## 2023-02-03 DIAGNOSIS — S72001A Fracture of unspecified part of neck of right femur, initial encounter for closed fracture: Secondary | ICD-10-CM | POA: Diagnosis not present

## 2023-02-03 MED ORDER — OXYCODONE HCL 5 MG PO TABS: 5.0000 mg | ORAL_TABLET | Freq: Four times a day (QID) | ORAL | 0 refills | Status: DC | PRN

## 2023-02-03 MED ORDER — VITAMIN D3 25 MCG PO TABS: 1000.0000 [IU] | ORAL_TABLET | Freq: Every day | ORAL | 2 refills | Status: AC

## 2023-02-03 NOTE — Progress Notes (Signed)
Mobility Specialist Progress Note:    02/03/23 0925  Mobility  Activity Transferred from bed to chair  Level of Assistance Minimal assist, patient does 75% or more  Assistive Device Front wheel walker  Distance Ambulated (ft) 6 ft  RLE Weight Bearing WBAT  Activity Response Tolerated well  Mobility Referral Yes  $Mobility charge 1 Mobility   Pt agreeable to mobility session. Transferred B>C with RW, required MinA to pivot. Tolerated well, asx throughout. Left pt in chair,  NT's in room. All needs met, chair alarm on, call bell in reach.   Royetta Crochet Mobility Specialist Please contact via Solicitor or  Rehab office at [phone]

## 2023-02-03 NOTE — Care Management Important Message (Signed)
Important Message  Patient Details  Name: [name] MRN: [id number] Date of Birth: 06-Jan-1956   Medicare Important Message Given:  Yes     Tommy Medal 02/03/2023, 11:19 AM

## 2023-02-03 NOTE — TOC Transition Note (Signed)
Transition of Care [facility]) - CM/SW Discharge Note   Patient Details  Name: [name] MRN: [id number] Date of Birth: 20-Sep-1956  [facility]) CM/SW Contact:  Ihor Gully, LCSW Phone Number: 02/03/2023, 12:10 PM   Clinical Narrative:    3n1 to be delivered to patient's home today via [employer].      Barriers to Discharge: Continued Medical Work up   Patient Goals and CMS Choice      Discharge Placement                         Discharge Plan and Services Additional resources added to the After Visit Summary for                  DME Arranged: 3-N-1 DME Agency: [employer] Date DME Agency Contacted: 02/03/23 Time DME Agency Contacted: [id number] Representative spoke with at DME Agency: Erasmo Downer HH Arranged: RN, PT, Nurse's Aide [place] Agency: [place] ([place]) Date [place]: 01/29/23 Time [place]: [place] Representative spoke with at [place]: [place] ([place]) Interventions [place]: Food Insecurity Present (01/27/2023)  Housing: Low Risk  (01/27/2023)  Transportation Needs: Unmet Transportation Needs (01/27/2023)  Utilities: Not At Risk (01/27/2023)  Depression (PHQ2-9): Medium Risk (12/14/2019)  Tobacco Use: High Risk (01/29/2023)     Readmission Risk Interventions     No data to display

## 2023-02-03 NOTE — Discharge Summary (Signed)
Physician Discharge Summary  [name] [id number] DOB: Jun 26, 1956 DOA: 01/26/2023  PCP: [name], MD  Admit date: 01/26/2023 Discharge date: 02/03/2023  Admitted From: Home Disposition: Home  Recommendations for Outpatient Follow-up:  Follow up with PCP in 1-2 weeks Follow-up with orthopedics with Dr. Amedeo Kinsman in 2 weeks Started on vitamin D supplement for deficiency Please obtain BMP/CBC in one week to ensure renal function and hemoglobin stable Continue to encourage cocaine cessation/abstinence  Home Health: PT, RN, aide Equipment/Devices: 3 and 1 bedside commode  Discharge Condition: Stable CODE STATUS: Full code Diet recommendation: Heart healthy diet  History of present illness:  [name] is a 67 y.o. female with past medical history significant for HTN, HLD, COPD, CAD, history of CVA, history of left breast cancer, polysubstance abuse with EtOH, cocaine who presented to [facility] ED on 3/11 via EMS from home after fall at home with complaints of right hip pain.  Patient denied hitting her head or loss of consciousness.  Endorses Plavix use but denies any other anticoagulant.   In the ED, temperature 97.6 F, HR 57, RR 18, BP 140/70, SpO2 100% on room air.  WBC 15.0, hemoglobin 11.1, platelets 300.  Sodium 132, potassium 3.5, chloride 100, CO2 27, glucose 123, BUN 13, creatinine 0.86.  Magnesium 2.1.  Chest x-ray with no active cardiopulmonary disease process.  Right hip/pelvis x-ray with acute proximal right femur fracture.  CT right hip with comminuted, displaced and angulated intertrochanteric fracture of the right femur.  Orthopedics was consulted.  TRH consulted for admission and further evaluation and management of acute right hip fracture.  Hospital course:  Acute comminuted/displaced/angulated intertrochanteric right femur fracture Patient presenting to ED following fall at home with associated right hip pain and inability to ambulate.  Imaging on  admission notable for comminuted, displaced, angulated intertrochanteric fracture of the right femur.  Orthopedics was consulted and patient underwent cephalic medullary nailing by Dr. Amedeo Kinsman on 01/29/2023.  Patient is weightbearing as tolerates right lower extremity.  Continue aspirin and Plavix for DVT prophylaxis.  Continue oxycodone as needed for pain control.  PT/OT initially recommended SNF but patient declining would like to return home; also would be difficult placement given her active substance abuse per social work.  Outpatient follow-up with orthopedics, Dr. Amedeo Kinsman 2 weeks   Acute blood loss anemia Hemoglobin 11.1 on admission, has trended down to 7.4 today.  Likely in the setting of long bone fracture.  Anemia panel with iron 22, TIBC 232, ferritin 187, B12 560, folate 8.4.  Transfuse 1 unit PRBC on 3/16 and 1u pRBC on 3/17.  Hemoglobin remained stable and was 9.3 at time of discharge.  Recommend repeat CBC 1 week.   Vitamin D deficiency Vitamin D 25-hydroxy level low at 19.70.  Continue cholecalciferol daily.   Essential hypertension Continue home lisinopril/HCTZ, carvedilol, amlodipine, aspirin, statin   Hyperlipidemia Atorvastatin 40 mg p.o. daily   COPD Not oxygen dependent at baseline   CAD Hx CVA Continue statin, aspirin, Plavix   Polysubstance abuse Patient with known history of alcohol, cocaine abuse.  UDS on admission positive for opiates, THC, cocaine.  Counseled on need for complete cessation/abstinence.   Severe protein calorie malnutrition Body mass index is 16.65 kg/m.  As noted with significant muscle wasting, flat depletion noted on physical exam. Nutrition Status: Nutrition Problem: Inadequate oral intake Etiology: lethargy/confusion, acute illness Signs/Symptoms: meal completion < 25% Interventions: Ensure Enlive (each supplement provides 350kcal and 20 grams of protein), Magic cup, MVI --  Dietitian following, encourage increased oral intake,  supplementation  Discharge Diagnoses:  Principal Problem:   Right femoral fracture ([place]) Active Problems:   TOBACCO DEPENDENCE   Essential hypertension   Mixed hyperlipidemia   Underweight   COPD (chronic obstructive pulmonary disease) (HCC)    Discharge Instructions  Discharge Instructions     Call MD for:  difficulty breathing, headache or visual disturbances   Complete by: As directed    Call MD for:  extreme fatigue   Complete by: As directed    Call MD for:  persistant dizziness or light-headedness   Complete by: As directed    Call MD for:  persistant nausea and vomiting   Complete by: As directed    Call MD for:  severe uncontrolled pain   Complete by: As directed    Call MD for:  temperature >100.4   Complete by: As directed    Diet - low sodium heart healthy   Complete by: As directed    Increase activity slowly   Complete by: As directed    No wound care   Complete by: As directed       Allergies as of 02/03/2023       Reactions   Bee Venom Anaphylaxis   Other    Patient states she has been told not to take ibuprofen and tylenol        Medication List     TAKE these medications    albuterol 108 (90 Base) MCG/ACT inhaler Commonly known as: VENTOLIN HFA Inhale 2 puffs into the lungs every 6 (six) hours as needed for wheezing. For wheezing   amLODipine 10 MG tablet Commonly known as: NORVASC Take 10 mg by mouth daily.   aspirin EC 81 MG tablet Take 81 mg by mouth daily.   atorvastatin 20 MG tablet Commonly known as: Lipitor Take 1 tablet (20 mg total) by mouth daily.   atorvastatin 40 MG tablet Commonly known as: LIPITOR Take 40 mg by mouth daily.   carbamazepine 200 MG tablet Commonly known as: TEGRETOL Take 200 mg by mouth 2 (two) times daily.   carvedilol 6.25 MG tablet Commonly known as: Coreg Take 1 tablet (6.25 mg total) by mouth 2 (two) times daily with a meal.   carvedilol 12.5 MG tablet Commonly known as: COREG Take  12.5 mg by mouth 2 (two) times daily.   clopidogrel 75 MG tablet Commonly known as: PLAVIX Take 75 mg by mouth daily.   lisinopril-hydrochlorothiazide 20-12.5 MG tablet Commonly known as: ZESTORETIC Take 1 tablet by mouth 2 (two) times daily.   nitroGLYCERIN 0.4 MG SL tablet Commonly known as: NITROSTAT Place 0.4 mg under the tongue every 5 (five) minutes as needed for chest pain.   oxyCODONE 5 MG immediate release tablet Commonly known as: Oxy IR/ROXICODONE Take 1 tablet (5 mg total) by mouth every 6 (six) hours as needed for moderate pain.   potassium chloride SA 20 MEQ tablet Commonly known as: KLOR-CON M Take 1 tablet (20 mEq total) by mouth daily.   spironolactone 25 MG tablet Commonly known as: ALDACTONE Take 25 mg by mouth daily.   vitamin D3 25 MCG (1000 UT) tablet Generic drug: Cholecalciferol Take 1 tablet (1,000 Units total) by mouth daily.               Durable Medical Equipment  (From admission, onward)           Start     Ordered   02/02/23 0919  For home  use only DME 3 n 1  Once        02/02/23 [id number]            Follow-up Information     [name], MD. Schedule an appointment as soon as possible for a visit in 1 week(s).   Specialty: Family Medicine Contact information: [place] [address] [phone]         Mordecai Rasmussen, MD. Schedule an appointment as soon as possible for a visit in 2 week(s).   Specialties: Orthopedic Surgery, Sports Medicine Contact information: [place]. [address] [place] [address] [phone]                Allergies  Allergen Reactions   Bee Venom Anaphylaxis   Other     Patient states she has been told not to take ibuprofen and tylenol    Consultations: Orthopedics, Dr. Amedeo Kinsman   Procedures/Studies: DG Pelvis Portable  Result Date: 01/29/2023 CLINICAL DATA:  Postop EXAM: PORTABLE PELVIS 1-2 VIEWS COMPARISON:  None Available. FINDINGS: Interval ORIF of the right  femur. No evidence of immediate hardware complications. Expected postoperative soft tissue gas. Vascular calcifications. No other fractures are visualized. Assessment of the sacrum is limited due to overlying bowel gas. IMPRESSION: Expected postsurgical changes from recent ORIF of right femur fracture. Electronically Signed   By: Marin Roberts M.D.   On: 01/29/2023 10:14   DG FEMUR PORT, MIN 2 VIEWS RIGHT  Result Date: 01/29/2023 CLINICAL DATA:  Postoperative right intramedullary nail EXAM: RIGHT FEMUR PORTABLE 2 VIEW COMPARISON:  01/29/2023 FINDINGS: Status post intramedullary nail fixation of intratrochanteric fractures of the right femur with expected overlying postoperative change. No evidence of perihardware fracture or component malpositioning. IMPRESSION: Status post intramedullary nail fixation of intratrochanteric fractures of the right femur with expected overlying postoperative change. No evidence of perihardware fracture or component malpositioning. Electronically Signed   By: Delanna Ahmadi M.D.   On: 01/29/2023 10:09   DG FEMUR, MIN 2 VIEWS RIGHT  Result Date: 01/29/2023 CLINICAL DATA:  Fracture right femur, fluoroscopic assistance for internal fixation EXAM: RIGHT FEMUR 2 VIEWS COMPARISON:  01/26/2023 FINDINGS: Fluoroscopic images show reduction and internal fixation of intertrochanteric fracture of right femur with intramedullary rod. Fluoroscopic time was 63 seconds. Radiation dose 6.81 mGy. IMPRESSION: Fluoroscopic assistance was provided for internal fixation of intertrochanteric fracture of proximal right femur. Electronically Signed   By: Elmer Picker M.D.   On: 01/29/2023 09:58   DG C-Arm 1-60 Min-No Report  Result Date: 01/29/2023 Fluoroscopy was utilized by the requesting physician.  No radiographic interpretation.   CT HIP RIGHT WO CONTRAST  Result Date: 01/27/2023 CLINICAL DATA:  Right hip pain status post fall. EXAM: CT OF THE RIGHT HIP WITHOUT CONTRAST TECHNIQUE:  Multidetector CT imaging of the right hip was performed according to the standard protocol. Multiplanar CT image reconstructions were also generated. RADIATION DOSE REDUCTION: This exam was performed according to the departmental dose-optimization program which includes automated exposure control, adjustment of the mA and/or kV according to patient size and/or use of iterative reconstruction technique. COMPARISON:  Radiographs 01/26/2023 and 04/10/2020. CT pelvis 01/20/2020. FINDINGS: Bones/Joint/Cartilage There is a comminuted, displaced and angulated intertrochanteric fracture of the right femur. This fracture results in moderate varus angulation and apex anterior angulation. The femoral head is located. There are mild underlying right hip degenerative changes and no significant hip joint effusion. No evidence of fracture of the right hemipelvis. Lower lumbar spondylosis noted. Ligaments Suboptimally assessed by  CT. Muscles and Tendons No intramuscular fluid collection or focal atrophy identified. Soft tissues 2.6 x 1.6 cm soft tissue hematoma along the posteromedial aspect of the proximal femur fracture. No other focal fluid collections, foreign bodies or soft tissue emphysema. Iliofemoral atherosclerosis noted. IMPRESSION: 1. Comminuted, displaced and angulated intertrochanteric fracture of the right femur as described. 2. No other acute osseous findings identified. 3. Mild underlying right hip degenerative changes. Electronically Signed   By: Richardean Sale M.D.   On: 01/27/2023 11:48   DG Chest Port 1 View  Result Date: 01/27/2023 CLINICAL DATA:  preop respiratory exam EXAM: PORTABLE CHEST 1 VIEW COMPARISON:  Chest x-ray 01/25/2023 FINDINGS: The heart and mediastinal contours are unchanged. Aortic calcification. No focal consolidation. No pulmonary edema. No pleural effusion. No pneumothorax. No acute osseous abnormality. IMPRESSION: No active disease. Electronically Signed   By: Iven Finn M.D.    On: 01/27/2023 02:17   DG Hip Unilat  With Pelvis 2-3 Views Right  Result Date: 01/27/2023 CLINICAL DATA:  Fall with hip pain EXAM: DG HIP (WITH OR WITHOUT PELVIS) 2-3V RIGHT COMPARISON:  04/10/2020 FINDINGS: Pubic symphysis appears intact. SI joints are non widened. Acute fracture of proximal right femur, appears to involves the basicervical/intertrochanteric region but limited due to positioning. Femoral head projects in joint. IMPRESSION: Acute proximal right femur fracture, appears to involve the basicervical/intertrochanteric region but limited by positioning. Electronically Signed   By: Donavan Foil M.D.   On: 01/27/2023 00:19   CT HEAD WO CONTRAST (5MM)  Result Date: 01/25/2023 CLINICAL DATA:  Delirium EXAM: CT HEAD WITHOUT CONTRAST TECHNIQUE: Contiguous axial images were obtained from the base of the skull through the vertex without intravenous contrast. RADIATION DOSE REDUCTION: This exam was performed according to the departmental dose-optimization program which includes automated exposure control, adjustment of the mA and/or kV according to patient size and/or use of iterative reconstruction technique. COMPARISON:  CT head 10/31/2021 FINDINGS: Brain: No evidence of large-territorial acute infarction. No parenchymal hemorrhage. No mass lesion. No extra-axial collection. No mass effect or midline shift. No hydrocephalus. Basilar cisterns are patent. Vascular: No hyperdense vessel. Atherosclerotic calcifications are present within the cavernous internal carotid and vertebral arteries. Skull: No acute fracture or focal lesion. Sinuses/Orbits: Paranasal sinuses and mastoid air cells are clear. Right lens replacement. Otherwise the orbits are unremarkable. Other: None. IMPRESSION: No acute intracranial abnormality. Electronically Signed   By: Iven Finn M.D.   On: 01/25/2023 00:33   DG Chest Port 1 View  Result Date: 01/25/2023 CLINICAL DATA:  Chest pain EXAM: PORTABLE CHEST 1 VIEW COMPARISON:   10/17/2022 FINDINGS: Stable cardiomediastinal silhouette. Aortic atherosclerotic calcification. No focal consolidation, pleural effusion, or pneumothorax. 7 mm nodular density projecting over the right lower lung. This may be a nipple shadow. Chronic bronchitic changes and hyperinflation. Remote right rib fractures. IMPRESSION: 7 mm nodular density projecting over the right lower lung. This is indeterminate for pulmonary nodule versus nipple shadow. Consider repeat radiographs with nipple markers. Emphysema. Electronically Signed   By: Placido Sou M.D.   On: 01/25/2023 00:31     Subjective: Patient seen examined at bedside, resting calmly.  Lying in bed.  No specific complaints this morning and ready for discharge home.  Denies headache, dizziness, no chest pain, no shortness of breath, no abdominal pain, no fever/chills/night sweats, no nausea/vomitus diarrhea, no focal weakness, no fatigue, no paresthesias.  No acute events overnight per nursing staff.  Discharge Exam: Vitals:   02/02/23 2113 02/03/23 0434  BP: 136/69 [name])  158/76  Pulse: 73 77  Resp: 18 18  Temp: 99.1 F (37.3 C) 98.2 F (36.8 C)  SpO2: 100% 94%   Vitals:   02/01/23 2028 02/02/23 1210 02/02/23 2113 02/03/23 0434  BP: 117/73 116/71 136/69 (!) 158/76  Pulse: 84 85 73 77  Resp: 18 18 18 18   Temp: 98.6 F (37 C) 98.4 F (36.9 C) 99.1 F (37.3 C) 98.2 F (36.8 C)  TempSrc: Oral  Oral Oral  SpO2: 100% 100% 100% 94%  Weight:      Height:        Physical Exam: GEN: NAD, alert, chronically ill/cachectic in appearance, appears older than stated age HEENT: NCAT, PERRL, EOMI, sclera clear, MMM PULM: CTAB w/o wheezes/crackles, normal respiratory effort, on room air CV: RRR w/o M/G/R GI: abd soft, NTND, NABS, no R/G/M MSK: no peripheral edema, right hip with surgical dressing in place, clean/dry/intact, no concerning areas of fluctuance/erythema, ecchymosis. Neurovascularly intact NEURO: CN II-XII intact, no focal  deficits, sensation to light touch intact PSYCH: Depressed mood, flat affect Integumentary: Right hip operative site as above, otherwise no concerning rashes/lesions/wounds noted on exposed skin surfaces    The results of significant diagnostics from this hospitalization (including imaging, microbiology, ancillary and laboratory) are listed below for reference.     Microbiology: Recent Results (from the past 240 hour(s))  MRSA Next Gen by PCR, Nasal     Status: None   Collection Time: 01/27/23  5:10 PM   Specimen: Nasal Mucosa; Nasal Swab  Result Value Ref Range Status   MRSA by PCR Next Gen NOT DETECTED NOT DETECTED Final    Comment: (NOTE) The GeneXpert MRSA Assay (FDA approved for NASAL specimens only), is one component of a comprehensive MRSA colonization surveillance program. It is not intended to diagnose MRSA infection nor to guide or monitor treatment for MRSA infections. Test performance is not FDA approved in patients less than 79 years old. Performed at [facility], [address] [place], [place] [address]      Labs: BNP (last 3 results) No results for input(s): "BNP" in the last 8760 hours. Basic Metabolic Panel: Recent Labs  Lab 01/28/23 0432 01/29/23 0440 01/30/23 0535 01/31/23 0652  NA 134* 137 140 137  K 3.7 3.9 3.9 3.8  CL 100 105 107 103  CO2 26 21* 25 26  GLUCOSE 110* 101* 110* 97  BUN 19 44* 22 14  CREATININE 1.09* 0.93 0.85 0.78  CALCIUM 9.1 8.9 8.9 8.7*  MG  --  2.2  --   --    Liver Function Tests: Recent Labs  Lab 01/28/23 0432  AST 16  ALT 16  ALKPHOS 39  BILITOT 0.6  PROT 6.5  ALBUMIN 3.3*   No results for input(s): "LIPASE", "AMYLASE" in the last 168 hours. No results for input(s): "AMMONIA" in the last 168 hours. CBC: Recent Labs  Lab 01/28/23 0432 01/29/23 0440 01/30/23 0535 01/30/23 1721 01/31/23 0652 02/01/23 0517 02/02/23 0449  WBC 9.0 11.9* 12.3*  --  11.9*  --   --   HGB 9.7* 7.4* 5.6* 8.1* 7.6* 9.7* 9.3*  HCT  28.2* 22.0* 16.6* 23.7* 22.8* 27.7* 28.1*  MCV 84.7 87.0 87.8  --  87.4  --   --   PLT 270 258 211  --  262  --   --    Cardiac Enzymes: No results for input(s): "CKTOTAL", "CKMB", "CKMBINDEX", "TROPONINI" in the last 168 hours. BNP: Invalid input(s): "POCBNP" CBG: No results for input(s): "GLUCAP" in  the last 168 hours. D-Dimer No results for input(s): "DDIMER" in the last 72 hours. Hgb A1c No results for input(s): "HGBA1C" in the last 72 hours. Lipid Profile No results for input(s): "CHOL", "HDL", "LDLCALC", "TRIG", "CHOLHDL", "LDLDIRECT" in the last 72 hours. Thyroid function studies No results for input(s): "TSH", "T4TOTAL", "T3FREE", "THYROIDAB" in the last 72 hours.  Invalid input(s): "FREET3" Anemia work up No results for input(s): "VITAMINB12", "FOLATE", "FERRITIN", "TIBC", "IRON", "RETICCTPCT" in the last 72 hours. Urinalysis    Component Value Date/Time   COLORURINE YELLOW 10/31/2021 2017   APPEARANCEUR CLEAR 10/31/2021 2017   LABSPEC 1.015 10/31/2021 2017   PHURINE 6.0 10/31/2021 2017   GLUCOSEU NEGATIVE 10/31/2021 2017   HGBUR NEGATIVE 10/31/2021 2017   BILIRUBINUR NEGATIVE 10/31/2021 2017   KETONESUR NEGATIVE 10/31/2021 2017   PROTEINUR NEGATIVE 10/31/2021 2017   UROBILINOGEN 1.0 05/12/2014 1930   NITRITE NEGATIVE 10/31/2021 2017   LEUKOCYTESUR NEGATIVE 10/31/2021 2017   Sepsis Labs Recent Labs  Lab 01/28/23 0432 01/29/23 0440 01/30/23 0535 01/31/23 0652  WBC 9.0 11.9* 12.3* 11.9*   Microbiology Recent Results (from the past 240 hour(s))  MRSA Next Gen by PCR, Nasal     Status: None   Collection Time: 01/27/23  5:10 PM   Specimen: Nasal Mucosa; Nasal Swab  Result Value Ref Range Status   MRSA by PCR Next Gen NOT DETECTED NOT DETECTED Final    Comment: (NOTE) The GeneXpert MRSA Assay (FDA approved for NASAL specimens only), is one component of a comprehensive MRSA colonization surveillance program. It is not intended to diagnose MRSA infection nor  to guide or monitor treatment for MRSA infections. Test performance is not FDA approved in patients less than 53 years old. Performed at [facility], [address], [place], [place] [address]      Time coordinating discharge: Over 30 minutes  SIGNED:   Assia Meanor J [place] DO  [facility] 02/03/2023, 9:59 AM

## 2023-02-03 NOTE — Progress Notes (Signed)
Discharge instructions reviewed with patient and patients sister. Both verbalized understanding of instructions. Patient discharged home with family in stable condition.

## 2023-02-04 ENCOUNTER — Telehealth: Payer: Self-pay

## 2023-02-04 LAB — TYPE AND SCREEN
ABO/RH(D): A POS
Antibody Screen: NEGATIVE
Unit division: 0

## 2023-02-04 LAB — BPAM RBC
Blood Product Expiration Date: 202404142359
ISSUE DATE / TIME: 202403151025
Unit Type and Rh: 6200

## 2023-02-04 NOTE — Transitions of Care (Post Inpatient/ED Visit) (Unsigned)
   02/04/2023  Name: [name] MRN: [id number] DOB: 02/23/1956  Today's TOC FU Call Status: Today's TOC FU Call Status:: Unsuccessul Call (1st Attempt) Unsuccessful Call (1st Attempt) Date: 02/04/23  Attempted to reach the patient regarding the most recent Inpatient/ED visit.  Follow Up Plan: Additional outreach attempts will be made to reach the patient to complete the Transitions of Care (Post Inpatient/ED visit) call.   Signature Juanda Crumble, [place] Direct Dial [phone]

## 2023-02-05 ENCOUNTER — Telehealth: Payer: Self-pay | Admitting: Orthopedic Surgery

## 2023-02-05 NOTE — Telephone Encounter (Signed)
Dr. Amedeo Kinsman pt - Lequeta (sp?) w/[facility] [phone] (I couldn't understand what she said) called lvm stating that she needs to confirm several orders for this patient.

## 2023-02-05 NOTE — Transitions of Care (Post Inpatient/ED Visit) (Signed)
   02/05/2023  Name: [name] MRN: [id number] DOB: 01-13-1956  Today's TOC FU Call Status: Today's TOC FU Call Status:: Successful TOC FU Call Competed Unsuccessful Call (1st Attempt) Date: 02/04/23 [facility] FU Call Complete Date: 02/05/23  Transition Care Management Follow-up Telephone Call Date of Discharge: 02/03/23 Discharge [facility] Type of Discharge: Inpatient Admission Primary Inpatient Discharge Diagnosis:: fracture right femur How have you been since you were released from the hospital?: Better Any questions or concerns?: No  Items Reviewed: Did you receive and understand the discharge instructions provided?: Yes Medications obtained and verified?: Yes (Medications Reviewed) Any new allergies since your discharge?: No Dietary orders reviewed?: Yes Do you have support at home?: No  Home Care and Equipment/Supplies: [place] Ordered?: Yes Name of [place]:: unknown Has Agency set up a time to come to your home?: No EMR reviewed for [place] Orders: Orders present/patient has not received call (refer to CM for follow-up) Any new equipment or medical supplies ordered?: NA  Functional Questionnaire: Do you need assistance with bathing/showering or dressing?: Yes Do you need assistance with meal preparation?: Yes Do you need assistance with eating?: No Do you have difficulty maintaining continence: No Do you need assistance with getting out of bed/getting out of a chair/moving?: Yes Do you have difficulty managing or taking your medications?: No  Follow up appointments reviewed: PCP Follow-up appointment confirmed?: Yes Date of PCP follow-up appointment?: 02/11/23 Follow-up Provider: Dr Nancy Fetter [facility] Follow-up appointment confirmed?: NA Do you need transportation to your follow-up appointment?: No Do you understand care options if your condition(s) worsen?: Yes-patient verbalized understanding    [place], [place] Direct Dial [phone]

## 2023-02-06 NOTE — Telephone Encounter (Signed)
Dr. Amedeo Kinsman patient - [name] a RN w/[facility] [phone] lvm stating she called yesterday regarding orders for [facility] services.  She completed her evaluation yesterday and is just waiting to discuss the non removable dressing on her right hip.

## 2023-02-09 ENCOUNTER — Encounter (HOSPITAL_COMMUNITY): Payer: Self-pay | Admitting: Orthopedic Surgery

## 2023-02-09 NOTE — Telephone Encounter (Signed)
Returned call at number provided. Only rang 1 x and then beeped but no vm message.

## 2023-02-09 NOTE — Progress Notes (Unsigned)
    SUBJECTIVE:   CHIEF COMPLAINT / HPI:  No chief complaint on file.   Patient was admitted from 3/11 to 3/19 for right hip fracture, underwent medullary nail procedure on 3/14.  Recommendations for Outpatient Follow-up:  Follow up with PCP in 1-2 weeks Follow-up with orthopedics with Dr. Amedeo Kinsman in 2 weeks Started on vitamin D supplement for deficiency Please obtain BMP/CBC in one week to ensure renal function and hemoglobin stable Continue to encourage cocaine cessation/abstinence PERTINENT  PMH / PSH: ***  Patient Care Team: [name], MD as PCP - General (Family Medicine) [name], MD as Consulting Physician (Hematology and Oncology) [name], [name], NP as Nurse Practitioner (Hematology and Oncology) [name], MD as Consulting Physician (Radiation Oncology) [name], MD as Consulting Physician (General Surgery)   OBJECTIVE:   There were no vitals taken for this visit.  Physical Exam      12/14/2019    2:38 PM  Depression screen PHQ 2/9  Decreased Interest 3  Down, Depressed, Hopeless 2  PHQ - 2 Score 5  Altered sleeping 3  Tired, decreased energy 3  Change in appetite 3  Feeling bad or failure about yourself  0  Trouble concentrating 0  Moving slowly or fidgety/restless 0  Suicidal thoughts 0  PHQ-9 Score 14  Difficult doing work/chores Somewhat difficult     {Show previous vital signs (optional):23777}  {Labs  Heme  Chem  Endocrine  Serology  Results Review (optional):23779}  ASSESSMENT/PLAN:   No problem-specific Assessment & Plan notes found for this encounter.    No follow-ups on file.   [name], MD [place]

## 2023-02-09 NOTE — Patient Instructions (Incomplete)
It was nice seeing you today!  I have placed referral for the gastroenterologist for colon cancer screening.  We are checking some blood work today.  Make sure to follow-up with the orthopedic doctor tomorrow as scheduled.  Someone will be calling you to schedule your Medicare annual wellness visit. This is usually a phone call from one of our nurses.   Stay well, [name], MD [place] [phone]  --  Make sure to check out at the front desk before you leave today.  Please arrive at least 15 minutes prior to your scheduled appointments.  If you had blood work today, I will send you a MyChart message or a letter if results are normal. Otherwise, I will give you a call.  If you had a referral placed, they will call you to set up an appointment. Please give [place] a call if you don't hear back in the next 2 weeks.  If you need additional refills before your next appointment, please call your pharmacy first.

## 2023-02-10 DIAGNOSIS — S72001A Fracture of unspecified part of neck of right femur, initial encounter for closed fracture: Secondary | ICD-10-CM | POA: Insufficient documentation

## 2023-02-11 ENCOUNTER — Ambulatory Visit (INDEPENDENT_AMBULATORY_CARE_PROVIDER_SITE_OTHER): Payer: Medicare Other | Admitting: Family Medicine

## 2023-02-11 ENCOUNTER — Encounter: Payer: Self-pay | Admitting: Family Medicine

## 2023-02-11 VITALS — BP 132/85 | HR 74 | Ht 64.0 in | Wt 70.5 lb

## 2023-02-11 DIAGNOSIS — Z23 Encounter for immunization: Secondary | ICD-10-CM | POA: Diagnosis not present

## 2023-02-11 DIAGNOSIS — S72141A Displaced intertrochanteric fracture of right femur, initial encounter for closed fracture: Secondary | ICD-10-CM

## 2023-02-11 DIAGNOSIS — Z09 Encounter for follow-up examination after completed treatment for conditions other than malignant neoplasm: Secondary | ICD-10-CM | POA: Diagnosis present

## 2023-02-11 DIAGNOSIS — I10 Essential (primary) hypertension: Secondary | ICD-10-CM

## 2023-02-11 DIAGNOSIS — D62 Acute posthemorrhagic anemia: Secondary | ICD-10-CM

## 2023-02-11 DIAGNOSIS — Z1211 Encounter for screening for malignant neoplasm of colon: Secondary | ICD-10-CM | POA: Diagnosis not present

## 2023-02-11 DIAGNOSIS — I2510 Atherosclerotic heart disease of native coronary artery without angina pectoris: Secondary | ICD-10-CM

## 2023-02-11 NOTE — Assessment & Plan Note (Signed)
Could benefit from bisphosphonate, plan to discuss at next visit

## 2023-02-11 NOTE — Telephone Encounter (Signed)
Spoke with [facility] nurse and let her know the bandage will be removed at her post op appointment tomorrow. Steri strips will most likely be placed and when they fall off they do not need to be replaced.

## 2023-02-12 ENCOUNTER — Encounter: Payer: Self-pay | Admitting: Family Medicine

## 2023-02-12 ENCOUNTER — Other Ambulatory Visit (INDEPENDENT_AMBULATORY_CARE_PROVIDER_SITE_OTHER): Payer: Medicare Other

## 2023-02-12 ENCOUNTER — Ambulatory Visit (INDEPENDENT_AMBULATORY_CARE_PROVIDER_SITE_OTHER): Payer: Medicare Other | Admitting: Orthopedic Surgery

## 2023-02-12 DIAGNOSIS — S72001D Fracture of unspecified part of neck of right femur, subsequent encounter for closed fracture with routine healing: Secondary | ICD-10-CM

## 2023-02-12 DIAGNOSIS — G8918 Other acute postprocedural pain: Secondary | ICD-10-CM

## 2023-02-12 LAB — BASIC METABOLIC PANEL WITH GFR
BUN/Creatinine Ratio: 12 (ref 12–28)
BUN: 12 mg/dL (ref 8–27)
CO2: 24 mmol/L (ref 20–29)
Calcium: 9.8 mg/dL (ref 8.7–10.3)
Chloride: 104 mmol/L (ref 96–106)
Creatinine, Ser: 1.03 mg/dL — ABNORMAL HIGH (ref 0.57–1.00)
Glucose: 75 mg/dL (ref 70–99)
Potassium: 4 mmol/L (ref 3.5–5.2)
Sodium: 144 mmol/L (ref 134–144)
eGFR: 59 mL/min/1.73 — ABNORMAL LOW

## 2023-02-12 LAB — CBC
Hematocrit: 31.8 % — ABNORMAL LOW (ref 34.0–46.6)
Hemoglobin: 10.4 g/dL — ABNORMAL LOW (ref 11.1–15.9)
MCH: 28.7 pg (ref 26.6–33.0)
MCHC: 32.7 g/dL (ref 31.5–35.7)
MCV: 88 fL (ref 79–97)
Platelets: 586 x10E3/uL — ABNORMAL HIGH (ref 150–450)
RBC: 3.63 x10E6/uL — ABNORMAL LOW (ref 3.77–5.28)
RDW: 13.3 % (ref 11.7–15.4)
WBC: 8.4 x10E3/uL (ref 3.4–10.8)

## 2023-02-12 MED ORDER — OXYCODONE HCL 5 MG PO TABS: 5.0000 mg | ORAL_TABLET | Freq: Four times a day (QID) | ORAL | 0 refills | Status: DC | PRN

## 2023-02-12 NOTE — Progress Notes (Signed)
Postop day 14  X-rays scheduled for right hip intramedullary nailing  Patient on aspirin Plavix oxycodone 5 mg in the form of IR  Wound was checked wound was cleaned Steri-Strips were removed sutures were trimmed  Patient receiving physical therapy at home and is ambulating with a walker at home with home PT  However, she complained of a lot of pain while she was being moved but says she is doing okay and only hurts when she is moved  Her x-rays show that the implant is in good position the fracture is healing there is been no change in position of the implant since surgery  She was see Dr. Loletha Grayer for x-rays in 4 weeks and I refilled her oxycodone  Meds ordered this encounter  Medications   oxyCODONE (OXY IR/ROXICODONE) 5 MG immediate release tablet    Sig: Take 1 tablet (5 mg total) by mouth every 6 (six) hours as needed for moderate pain.    Dispense:  30 tablet    Refill:  0

## 2023-02-16 ENCOUNTER — Telehealth: Payer: Self-pay | Admitting: Family Medicine

## 2023-02-16 NOTE — Telephone Encounter (Signed)
Called patient to schedule Medicare Annual Wellness Visit (AWV). No voicemail available to leave a message.  Last date of AWV: Hasn't had one  If patient calls back please see Darol Destine or forward me a message.   If any questions, please contact me at [phone].  Thank you ,  [name]

## 2023-03-03 ENCOUNTER — Ambulatory Visit (INDEPENDENT_AMBULATORY_CARE_PROVIDER_SITE_OTHER): Payer: Medicare Other | Admitting: Podiatry

## 2023-03-03 DIAGNOSIS — Z91199 Patient's noncompliance with other medical treatment and regimen due to unspecified reason: Secondary | ICD-10-CM

## 2023-03-03 NOTE — Progress Notes (Signed)
1. No-show for appointment     

## 2023-03-11 ENCOUNTER — Encounter: Payer: Medicare Other | Admitting: Orthopedic Surgery

## 2023-03-11 ENCOUNTER — Telehealth: Payer: Self-pay | Admitting: Orthopedic Surgery

## 2023-03-11 NOTE — Telephone Encounter (Signed)
Returned the patient's call, she lvm stating someone from our office had called her.  I can not leave a message, I get this call can not be completed at this time.  She missed her appointment today, needs to reschedule.

## 2023-03-16 ENCOUNTER — Telehealth: Payer: Self-pay | Admitting: Family Medicine

## 2023-03-16 NOTE — Telephone Encounter (Signed)
Contacted [name] to schedule their annual wellness visit. Appointment made for 03/23/2023.  Thank you,  [facility] Support [facility] Direct dial  [phone]

## 2023-03-17 ENCOUNTER — Telehealth: Payer: Self-pay | Admitting: Orthopedic Surgery

## 2023-03-17 NOTE — Telephone Encounter (Signed)
Dr. Dallas Schimke pt [name] w/[facility] [phone] lvm stating that the patient reported to the physical therapist that she has rats as big as cats.  She would like an order for a Child psychotherapist to come and evaluate and assist w/the issue.

## 2023-03-18 NOTE — Telephone Encounter (Signed)
Spoke with provider who is ok with giving this order, verbal order called and given to Sehili.

## 2023-03-23 ENCOUNTER — Ambulatory Visit: Payer: Medicare Other

## 2023-03-24 ENCOUNTER — Encounter: Payer: Medicare Other | Admitting: Orthopedic Surgery

## 2023-03-25 ENCOUNTER — Ambulatory Visit: Payer: Medicare Other | Admitting: Podiatry

## 2023-04-14 ENCOUNTER — Ambulatory Visit (INDEPENDENT_AMBULATORY_CARE_PROVIDER_SITE_OTHER): Payer: Medicare Other | Admitting: Orthopedic Surgery

## 2023-04-14 ENCOUNTER — Other Ambulatory Visit (INDEPENDENT_AMBULATORY_CARE_PROVIDER_SITE_OTHER): Payer: Medicare Other

## 2023-04-14 ENCOUNTER — Encounter: Payer: Self-pay | Admitting: Orthopedic Surgery

## 2023-04-14 DIAGNOSIS — S72001D Fracture of unspecified part of neck of right femur, subsequent encounter for closed fracture with routine healing: Secondary | ICD-10-CM | POA: Diagnosis not present

## 2023-04-14 DIAGNOSIS — S72141D Displaced intertrochanteric fracture of right femur, subsequent encounter for closed fracture with routine healing: Secondary | ICD-10-CM

## 2023-04-14 NOTE — Progress Notes (Signed)
Orthopaedic Postop Note  Assessment: [name] is a 67 y.o. female s/p cephalomedullary nail for Right intertrochanteric femur fracture  DOS: 01/29/23  Plan: [name] is progressing well.  Radiographs demonstrates excellent alignment of the fracture.  She does not have any pain.  No numbness or tingling.  Incisions have healed well.  Encouraged her to continue ambulate with assistance of a walker.  Walking is excellent therapy, and will continue to strengthen the bone.  Anticipate gradual improvement.  I would like to see her back in 3 months for 1 more evaluation.   Follow-up: Return in about 3 months (around 07/15/2023). XR at next visit: AP pelvis and Right femur  Subjective:  Chief Complaint  Patient presents with   Routine Post Op    R hip DOS 01/29/23    History of Present Illness: [name] is a 67 y.o. female who presents following the above stated procedure.  This is the first time I am seeing her in clinic for postop.  Surgery was approximately 10 weeks ago.  She saw Dr. Romeo Apple approximately 2 weeks after surgery.  Home health was prescribed, but she did received minimal therapy.  She does have the exercises that were provided to her.  She denies pain.  She has been ambulating with the assistance of a walker.  Review of Systems: No fevers or chills No numbness or tingling No Chest Pain No shortness of breath   Objective: There were no vitals taken for this visit.  Physical Exam:  Alert and oriented.  No acute distress.  Seated in wheelchair.  Surgical incisions have healed.  No surrounding erythema or drainage.  Able to maintain a straight leg raise.  Active motion intact in the TA/EHL.  Toes are warm and well-perfused.   IMAGING: I personally ordered and reviewed the following images:  XR of the Right femur and AP pelvis demonstrates a well positioned cephalomedullary nail.   The intertrochanteric femur fracture remains in stable position.  There is no  evidence of implant subsidence.  No acute fractures are noted.  Impression: Right intertrochanteric femur fracture in stable position without evidence   Oliver Barre, MD 04/14/2023 10:25 AM

## 2023-04-22 ENCOUNTER — Ambulatory Visit: Payer: Medicare Other | Admitting: Podiatry

## 2023-05-06 ENCOUNTER — Ambulatory Visit: Payer: Medicare Other | Admitting: Podiatry

## 2023-05-14 ENCOUNTER — Ambulatory Visit: Payer: Medicare Other | Admitting: Podiatry

## 2023-05-19 ENCOUNTER — Encounter: Payer: Medicare Other | Admitting: Orthopedic Surgery

## 2023-05-20 ENCOUNTER — Ambulatory Visit: Payer: Medicare Other | Admitting: Podiatry

## 2023-05-31 ENCOUNTER — Encounter (HOSPITAL_COMMUNITY): Payer: Self-pay

## 2023-05-31 ENCOUNTER — Emergency Department (HOSPITAL_COMMUNITY): Payer: Medicare Other

## 2023-05-31 ENCOUNTER — Inpatient Hospital Stay (HOSPITAL_COMMUNITY)
Admission: EM | Admit: 2023-05-31 | Discharge: 2023-06-03 | DRG: 689 | Disposition: A | Discharge status: Home Health | Payer: Medicare Other | Attending: Family Medicine | Admitting: Family Medicine

## 2023-05-31 DIAGNOSIS — Z8249 Family history of ischemic heart disease and other diseases of the circulatory system: Secondary | ICD-10-CM | POA: Diagnosis not present

## 2023-05-31 DIAGNOSIS — F4310 Post-traumatic stress disorder, unspecified: Secondary | ICD-10-CM | POA: Diagnosis present

## 2023-05-31 DIAGNOSIS — J4489 Other specified chronic obstructive pulmonary disease: Secondary | ICD-10-CM | POA: Diagnosis present

## 2023-05-31 DIAGNOSIS — R4182 Altered mental status, unspecified: Secondary | ICD-10-CM | POA: Diagnosis present

## 2023-05-31 DIAGNOSIS — S72001D Fracture of unspecified part of neck of right femur, subsequent encounter for closed fracture with routine healing: Secondary | ICD-10-CM

## 2023-05-31 DIAGNOSIS — Z823 Family history of stroke: Secondary | ICD-10-CM

## 2023-05-31 DIAGNOSIS — Z886 Allergy status to analgesic agent status: Secondary | ICD-10-CM

## 2023-05-31 DIAGNOSIS — B9620 Unspecified Escherichia coli [E. coli] as the cause of diseases classified elsewhere: Secondary | ICD-10-CM | POA: Diagnosis not present

## 2023-05-31 DIAGNOSIS — Z955 Presence of coronary angioplasty implant and graft: Secondary | ICD-10-CM

## 2023-05-31 DIAGNOSIS — E440 Moderate protein-calorie malnutrition: Secondary | ICD-10-CM | POA: Diagnosis present

## 2023-05-31 DIAGNOSIS — J449 Chronic obstructive pulmonary disease, unspecified: Secondary | ICD-10-CM | POA: Diagnosis present

## 2023-05-31 DIAGNOSIS — Z9012 Acquired absence of left breast and nipple: Secondary | ICD-10-CM

## 2023-05-31 DIAGNOSIS — N390 Urinary tract infection, site not specified: Principal | ICD-10-CM

## 2023-05-31 DIAGNOSIS — E871 Hypo-osmolality and hyponatremia: Secondary | ICD-10-CM | POA: Diagnosis not present

## 2023-05-31 DIAGNOSIS — D649 Anemia, unspecified: Secondary | ICD-10-CM | POA: Diagnosis not present

## 2023-05-31 DIAGNOSIS — Z5982 Transportation insecurity: Secondary | ICD-10-CM

## 2023-05-31 DIAGNOSIS — S72011A Unspecified intracapsular fracture of right femur, initial encounter for closed fracture: Secondary | ICD-10-CM | POA: Diagnosis present

## 2023-05-31 DIAGNOSIS — Z681 Body mass index (BMI) 19 or less, adult: Secondary | ICD-10-CM | POA: Diagnosis not present

## 2023-05-31 DIAGNOSIS — E8809 Other disorders of plasma-protein metabolism, not elsewhere classified: Secondary | ICD-10-CM | POA: Diagnosis not present

## 2023-05-31 DIAGNOSIS — G8929 Other chronic pain: Secondary | ICD-10-CM | POA: Diagnosis present

## 2023-05-31 DIAGNOSIS — Z1152 Encounter for screening for COVID-19: Secondary | ICD-10-CM | POA: Diagnosis not present

## 2023-05-31 DIAGNOSIS — Z853 Personal history of malignant neoplasm of breast: Secondary | ICD-10-CM | POA: Diagnosis not present

## 2023-05-31 DIAGNOSIS — H409 Unspecified glaucoma: Secondary | ICD-10-CM | POA: Diagnosis present

## 2023-05-31 DIAGNOSIS — Z8673 Personal history of transient ischemic attack (TIA), and cerebral infarction without residual deficits: Secondary | ICD-10-CM

## 2023-05-31 DIAGNOSIS — E876 Hypokalemia: Secondary | ICD-10-CM | POA: Diagnosis not present

## 2023-05-31 DIAGNOSIS — Z7902 Long term (current) use of antithrombotics/antiplatelets: Secondary | ICD-10-CM

## 2023-05-31 DIAGNOSIS — I2510 Atherosclerotic heart disease of native coronary artery without angina pectoris: Secondary | ICD-10-CM | POA: Diagnosis present

## 2023-05-31 DIAGNOSIS — Z91148 Patient's other noncompliance with medication regimen for other reason: Secondary | ICD-10-CM

## 2023-05-31 DIAGNOSIS — I10 Essential (primary) hypertension: Secondary | ICD-10-CM | POA: Diagnosis present

## 2023-05-31 DIAGNOSIS — I252 Old myocardial infarction: Secondary | ICD-10-CM

## 2023-05-31 DIAGNOSIS — F4321 Adjustment disorder with depressed mood: Secondary | ICD-10-CM | POA: Diagnosis present

## 2023-05-31 DIAGNOSIS — S72001A Fracture of unspecified part of neck of right femur, initial encounter for closed fracture: Secondary | ICD-10-CM | POA: Diagnosis present

## 2023-05-31 DIAGNOSIS — E46 Unspecified protein-calorie malnutrition: Secondary | ICD-10-CM | POA: Insufficient documentation

## 2023-05-31 DIAGNOSIS — Z8619 Personal history of other infectious and parasitic diseases: Secondary | ICD-10-CM

## 2023-05-31 DIAGNOSIS — F17210 Nicotine dependence, cigarettes, uncomplicated: Secondary | ICD-10-CM | POA: Diagnosis present

## 2023-05-31 DIAGNOSIS — E782 Mixed hyperlipidemia: Secondary | ICD-10-CM | POA: Diagnosis present

## 2023-05-31 DIAGNOSIS — G8918 Other acute postprocedural pain: Secondary | ICD-10-CM

## 2023-05-31 DIAGNOSIS — Z79899 Other long term (current) drug therapy: Secondary | ICD-10-CM

## 2023-05-31 DIAGNOSIS — Z5941 Food insecurity: Secondary | ICD-10-CM

## 2023-05-31 DIAGNOSIS — G9341 Metabolic encephalopathy: Secondary | ICD-10-CM | POA: Diagnosis present

## 2023-05-31 DIAGNOSIS — X58XXXA Exposure to other specified factors, initial encounter: Secondary | ICD-10-CM | POA: Diagnosis present

## 2023-05-31 DIAGNOSIS — Z91030 Bee allergy status: Secondary | ICD-10-CM

## 2023-05-31 DIAGNOSIS — M1990 Unspecified osteoarthritis, unspecified site: Secondary | ICD-10-CM | POA: Diagnosis present

## 2023-05-31 DIAGNOSIS — Z7982 Long term (current) use of aspirin: Secondary | ICD-10-CM

## 2023-05-31 DIAGNOSIS — R413 Other amnesia: Secondary | ICD-10-CM | POA: Diagnosis present

## 2023-05-31 DIAGNOSIS — Z8701 Personal history of pneumonia (recurrent): Secondary | ICD-10-CM

## 2023-05-31 LAB — CBC WITH DIFFERENTIAL/PLATELET
Abs Immature Granulocytes: 0.04 10*3/uL (ref 0.00–0.07)
Basophils Absolute: 0 10*3/uL (ref 0.0–0.1)
Basophils Relative: 0 %
Eosinophils Absolute: 0 10*3/uL (ref 0.0–0.5)
Eosinophils Relative: 0 %
HCT: 27.4 % — ABNORMAL LOW (ref 36.0–46.0)
Hemoglobin: 9.4 g/dL — ABNORMAL LOW (ref 12.0–15.0)
Immature Granulocytes: 1 %
Lymphocytes Relative: 13 %
Lymphs Abs: 1.1 10*3/uL (ref 0.7–4.0)
MCH: 28.5 pg (ref 26.0–34.0)
MCHC: 34.3 g/dL (ref 30.0–36.0)
MCV: 83 fL (ref 80.0–100.0)
Monocytes Absolute: 0.9 10*3/uL (ref 0.1–1.0)
Monocytes Relative: 10 %
Neutro Abs: 6.4 10*3/uL (ref 1.7–7.7)
Neutrophils Relative %: 76 %
Platelets: 248 10*3/uL (ref 150–400)
RBC: 3.3 MIL/uL — ABNORMAL LOW (ref 3.87–5.11)
RDW: 13.8 % (ref 11.5–15.5)
WBC: 8.5 10*3/uL (ref 4.0–10.5)
nRBC: 0 % (ref 0.0–0.2)

## 2023-05-31 LAB — COMPREHENSIVE METABOLIC PANEL
ALT: 10 U/L (ref 0–44)
AST: 17 U/L (ref 15–41)
Albumin: 3 g/dL — ABNORMAL LOW (ref 3.5–5.0)
Alkaline Phosphatase: 62 U/L (ref 38–126)
Anion gap: 10 (ref 5–15)
BUN: 12 mg/dL (ref 8–23)
CO2: 27 mmol/L (ref 22–32)
Calcium: 8.7 mg/dL — ABNORMAL LOW (ref 8.9–10.3)
Chloride: 97 mmol/L — ABNORMAL LOW (ref 98–111)
Creatinine, Ser: 0.85 mg/dL (ref 0.44–1.00)
GFR, Estimated: >60 mL/min (ref >60)
Glucose, Bld: 114 mg/dL — ABNORMAL HIGH (ref 70–99)
Potassium: 2.7 mmol/L — CL (ref 3.5–5.1)
Sodium: 134 mmol/L — ABNORMAL LOW (ref 135–145)
Total Bilirubin: 0.6 mg/dL (ref 0.3–1.2)
Total Protein: 6.6 g/dL (ref 6.5–8.1)

## 2023-05-31 LAB — PROTIME-INR
INR: 1.3 — ABNORMAL HIGH (ref 0.8–1.2)
Prothrombin Time: 16.2 seconds — ABNORMAL HIGH (ref 11.4–15.2)

## 2023-05-31 LAB — LACTIC ACID, PLASMA: Lactic Acid, Venous: 1.1 mmol/L (ref 0.5–1.9)

## 2023-05-31 LAB — APTT: aPTT: 28 seconds (ref 24–36)

## 2023-05-31 MED ORDER — OXYCODONE HCL 5 MG PO TABS
5.0000 mg | ORAL_TABLET | Freq: Once | ORAL | Status: AC
  Administered 2023-06-01: 5 mg via ORAL
  Filled 2023-05-31: qty 1

## 2023-05-31 MED ORDER — SODIUM CHLORIDE 0.9 % IV SOLN
1.0000 g | Freq: Once | INTRAVENOUS | Status: AC
  Administered 2023-06-01: 1 g via INTRAVENOUS
  Filled 2023-05-31: qty 10

## 2023-05-31 MED ORDER — LACTATED RINGERS IV SOLN: INTRAVENOUS | Status: AC

## 2023-05-31 MED ORDER — ACETAMINOPHEN 325 MG PO TABS
650.0000 mg | ORAL_TABLET | Freq: Once | ORAL | Status: AC
  Administered 2023-05-31: 650 mg via ORAL
  Filled 2023-05-31: qty 2

## 2023-05-31 NOTE — ED Provider Notes (Signed)
[facility]-EMERGENCY DEPT [facility] Emergency Department Provider Note MRN:  [id number]  Arrival date & time: 06/01/23     Chief Complaint   Altered mental status History of Present Illness   [name] is a 67 y.o. year-old female with a history of COPD, CAD presenting to the ED with chief complaint of altered mental status.  Increased confusion reported by son.  Febrile on arrival.  Patient denies any recent cough or burning with urination, no chest pain or shortness of breath, no abdominal pain, no recent falls, no headache.  Does endorse some continued pain to the right hip.  Had hip surgery a few months ago.  Review of Systems  A thorough review of systems was obtained and all systems are negative except as noted in the HPI and PMH.   Patient's Health History    Past Medical History:  Diagnosis Date   Adjustment disorder with depressed mood 01/11/2009   Arthritis    Asthma    Breast cancer (HCC)    left breast   CAD 07/10/2008   Chronic back pain    COCAINE DEPENDENCY NOS 01/14/2007   stopped Cocaine 15 years    CONVULSIONS, SEIZURES, NOS 01/14/2007   last seizure May, 2016   COPD (chronic obstructive pulmonary disease) (HCC)    Coronary artery disease 01/14/2012   CVA 01/14/2007   Difficulty seeing    patient has glaucoma   Family history of anesthesia complication 1984   Pts sister (27 years old) died after having anesthesia - induced a heart attcks   Glaucoma of both eyes    Headache(784.0)    HEPATITIS C 02/26/2009   Hepatitis C    Hyperlipemia    HYPERTENSION, BENIGN SYSTEMIC 01/14/2007   Memory loss    d/t CVA   MI (myocardial infarction) (HCC) 12/20/2007   had stent placed   Pneumonia    hx of   PTSD (post-traumatic stress disorder)    Right sided weakness    d/t stroke   Sciatica    Stroke [facility])    right side weakness   TOBACCO DEPENDENCE 01/14/2007    Past Surgical History:  Procedure Laterality Date   BREAST LUMPECTOMY Left 09/18/2017    Procedure: EVACUATION HEMATOMA  LEFT BREAST;  Surgeon: Abigail Miyamoto, MD;  Location: [facility] OR;  Service: General;  Laterality: Left;   CATARACT EXTRACTION W/PHACO Right 07/11/2015   Procedure: CATARACT EXTRACTION PHACO AND INTRAOCULAR LENS PLACEMENT (IOC) RIGHT,INTERNAL PLEB REVISION;  Surgeon: Chalmers Guest, MD;  Location: [facility] OR;  Service: Ophthalmology;  Laterality: Right;   CESAREAN SECTION     x 2   COLONOSCOPY W/ POLYPECTOMY     CORONARY ANGIOPLASTY WITH STENT PLACEMENT  2009   1 stent   EYE SURGERY     INTRAMEDULLARY (IM) NAIL INTERTROCHANTERIC Right 01/29/2023   Procedure: INTRAMEDULLARY (IM) NAIL INTERTROCHANTERIC;  Surgeon: Oliver Barre, MD;  Location: [facility] ORS;  Service: Orthopedics;  Laterality: Right;   MASTECTOMY W/ SENTINEL NODE BIOPSY Left 09/08/2017   Procedure: LEFT MASTECTOMY WITH SENTINEL LYMPH NODE BIOPSY;  Surgeon: Abigail Miyamoto, MD;  Location: [facility] OR;  Service: General;  Laterality: Left;   MITOMYCIN C APPLICATION Right 09/07/2013   Procedure: MITOMYCIN C APPLICATION RIGHT EYE;  Surgeon: Chalmers Guest, MD;  Location: [facility] OR;  Service: Ophthalmology;  Laterality: Right;   TONSILLECTOMY     TRABECULECTOMY Left 06/29/2013   Procedure: TRABECULECTOMY WITH MYTOMICIN C LEFT EYE;  Surgeon: Chalmers Guest, MD;  Location: [facility] OR;  Service: Ophthalmology;  Laterality: Left;   TRABECULECTOMY Right 09/07/2013   Procedure: TRABECULECTOMY RIGHT EYE;  Surgeon: Chalmers Guest, MD;  Location: [facility] OR;  Service: Ophthalmology;  Laterality: Right;    Family History  Problem Relation Age of Onset   Stroke Mother    Colon cancer Paternal Uncle    Heart disease Sister    Colon cancer Paternal Aunt     Social History   Socioeconomic History   Marital status: Single    Spouse name: Not on file   Number of children: 2   Years of education: Not on file   Highest education level: Not on file  Occupational History   Not on file  Tobacco Use   Smoking status: Some Days    Current packs/day:  0.25    Average packs/day: 0.3 packs/day for 47.0 years (11.8 ttl pk-yrs)    Types: Cigarettes   Smokeless tobacco: Never  Vaping Use   Vaping status: Never Used  Substance and Sexual Activity   Alcohol use: No    Comment: quit 4 years ago   Drug use: No    Comment: last use of cocaine 20 years ago, drug screen + cocaine   Sexual activity: Never    Birth control/protection: Abstinence, Post-menopausal  Other Topics Concern   Not on file  Social History Narrative   On disability- due to CVA/ PTSD and MI-- weak on right side from stroke.       Has 2 children.  Living with sister here in [place].       Updated 02/01/20   Patient reports she lives alone.  Has an aide that comes daily to assist with ADL's. Is on the wait list for meals on wheels   Had poor eye sight difficult seeing    Social Determinants of Health   Financial Resource Strain: Not on file  Food Insecurity: Food Insecurity Present (01/27/2023)   Hunger Vital Sign    Worried About Running Out of Food in the Last Year: Sometimes true    Ran Out of Food in the Last Year: Sometimes true  Transportation Needs: Unmet Transportation Needs (01/27/2023)   PRAPARE - Administrator, Civil Service (Medical): Yes    Lack of Transportation (Non-Medical): Yes  Physical Activity: Not on file  Stress: Not on file  Social Connections: Not on file  Intimate Partner Violence: Not At Risk (01/27/2023)   Humiliation, Afraid, Rape, and Kick questionnaire    Fear of Current or Ex-Partner: No    Emotionally Abused: No    Physically Abused: No    Sexually Abused: No     Physical Exam   Vitals:   05/31/23 2225 06/01/23 0100  BP: (!) 188/94 (!) 148/82  Pulse: 88 78  Resp: 18 16  Temp: (!) 103 F (39.4 C)   SpO2: 96% 98%    CONSTITUTIONAL: Well-appearing, NAD NEURO/PSYCH:  Alert, oriented to name, answers questions, moves all extremities EYES:  eyes equal and reactive ENT/NECK:  no LAD, no JVD CARDIO: Regular  rate, well-perfused, normal S1 and S2 PULM:  CTAB no wheezing or rhonchi GI/GU:  non-distended, non-tender MSK/SPINE:  No gross deformities, no edema SKIN:  no rash, atraumatic   *Additional and/or pertinent findings included in MDM below  Diagnostic and Interventional Summary    EKG Interpretation Date/Time:  Sunday May 31 2023 22:33:04 EDT Ventricular Rate:  93 PR Interval:  171 QRS Duration:  91 QT Interval:  350 QTC Calculation: 436 R Axis:   58  Text Interpretation: Sinus rhythm increased rate and  nonspecific STs from prior 3/24 Confirmed by Meridee Score [id number]) on 05/31/2023 10:43:35 PM       Labs Reviewed  COMPREHENSIVE METABOLIC PANEL - Abnormal; Notable for the following components:      Result Value   Sodium 134 (*)    Potassium 2.7 (*)    Chloride 97 (*)    Glucose, Bld 114 (*)    Calcium 8.7 (*)    Albumin 3.0 (*)    All other components within normal limits  CBC WITH DIFFERENTIAL/PLATELET - Abnormal; Notable for the following components:   RBC 3.30 (*)    Hemoglobin 9.4 (*)    HCT 27.4 (*)    All other components within normal limits  PROTIME-INR - Abnormal; Notable for the following components:   Prothrombin Time 16.2 (*)    INR 1.3 (*)    All other components within normal limits  URINALYSIS, W/ REFLEX TO CULTURE (INFECTION SUSPECTED) - Abnormal; Notable for the following components:   Color, Urine STRAW (*)    Hgb urine dipstick SMALL (*)    Leukocytes,Ua MODERATE (*)    Bacteria, UA RARE (*)    All other components within normal limits  CULTURE, BLOOD (ROUTINE X 2)  CULTURE, BLOOD (ROUTINE X 2)  SARS CORONAVIRUS 2 BY RT PCR  URINE CULTURE  LACTIC ACID, PLASMA  LACTIC ACID, PLASMA  APTT    DG HIP UNILAT WITH PELVIS 2-3 VIEWS RIGHT  Final Result    DG Chest Port 1 View  Final Result    CT Hip Right Wo Contrast    (Results Pending)    Medications  lactated ringers infusion ( Intravenous New Bag/Given 05/31/23 2330)  potassium  chloride 10 mEq in 100 mL IVPB (10 mEq Intravenous New Bag/Given 06/01/23 0107)  acetaminophen (TYLENOL) tablet 650 mg (650 mg Oral Given 05/31/23 2328)  oxyCODONE (Oxy IR/ROXICODONE) immediate release tablet 5 mg (5 mg Oral Given 06/01/23 0038)  cefTRIAXone (ROCEPHIN) 1 g in sodium chloride 0.9 % 100 mL IVPB (0 g Intravenous Stopped 06/01/23 0100)     Procedures  /  Critical Care .Critical Care  Performed by: Sabas Sous, MD Authorized by: Sabas Sous, MD   Critical care provider statement:    Critical care time (minutes):  35   Critical care was necessary to treat or prevent imminent or life-threatening deterioration of the following conditions: Concern for possible sepsis, profound hypokalemia.   Critical care was time spent personally by me on the following activities:  Development of treatment plan with patient or surrogate, discussions with consultants, evaluation of patient's response to treatment, examination of patient, ordering and review of laboratory studies, ordering and review of radiographic studies, ordering and performing treatments and interventions, pulse oximetry, re-evaluation of patient's condition and review of old charts   ED Course and Medical Decision Making  Initial Impression and Ddx Patient arrives with fever of 103.  Hypertensive, no tachycardia, no hypoxia, no increased work of breathing.  Suspect underlying infection of some kind, does not appear ill, not obviously septic.  Considering pneumonia, UTI, viral illness, septic joint is considered given patient's continued right hip pain but her range of motion is fairly preserved.  Past medical/surgical history that increases complexity of ED encounter: COPD, CAD  Interpretation of Diagnostics I personally reviewed the EKG and my interpretation is as follows: Sinus rhythm Labs reveal hyponatremia, hypokalemia, no leukocytosis  Urinalysis consistent with infection.  Patient Reassessment and Ultimate  Disposition/Management     Hip x-ray without signs of obvious infection but possibly a fracture or issue with the rod.  Will obtain CT for clarification.  Given patient's UTI, hypokalemia, fever, mild altered mental status, will admit to medicine.  Patient management required discussion with the following services or consulting groups:  None  Complexity of Problems Addressed Acute illness or injury that poses threat of life of bodily function  Additional Data Reviewed and Analyzed Further history obtained from: Prior labs/imaging results  Additional Factors Impacting ED Encounter Risk Consideration of hospitalization  Elmer Sow. Pilar Plate, MD [facility] Emergency Medicine [facility] [email or web address]  Final Clinical Impressions(s) / ED Diagnoses     ICD-10-CM   1. Urinary tract infection without hematuria, site unspecified  N39.0       ED Discharge Orders     None        Discharge Instructions Discussed with and Provided to Patient:   Discharge Instructions   None      Sabas Sous, MD 06/01/23 [phone]

## 2023-05-31 NOTE — ED Provider Notes (Signed)
MSE was initiated and I personally evaluated the patient and placed orders (if any) at  10:41 PM on May 31, 2023.  The patient appears stable so that the remainder of the MSE may be completed by another provider.  Patient brought in by ambulance from home for fever and altered mental status.  Patient is awake and denies any complaints other than some foot pain.  She had a temperature of 103.  Oxygenating well on room air.  Basic labs ordered.  IV fluids ordered, oral Tylenol ordered.   Terrilee Files, MD 06/01/23 [phone]

## 2023-06-01 ENCOUNTER — Other Ambulatory Visit: Payer: Self-pay

## 2023-06-01 ENCOUNTER — Emergency Department (HOSPITAL_COMMUNITY): Payer: Medicare Other

## 2023-06-01 DIAGNOSIS — G9341 Metabolic encephalopathy: Secondary | ICD-10-CM | POA: Insufficient documentation

## 2023-06-01 DIAGNOSIS — E440 Moderate protein-calorie malnutrition: Secondary | ICD-10-CM | POA: Diagnosis not present

## 2023-06-01 DIAGNOSIS — E871 Hypo-osmolality and hyponatremia: Secondary | ICD-10-CM | POA: Diagnosis not present

## 2023-06-01 DIAGNOSIS — Z1152 Encounter for screening for COVID-19: Secondary | ICD-10-CM | POA: Diagnosis not present

## 2023-06-01 DIAGNOSIS — N390 Urinary tract infection, site not specified: Secondary | ICD-10-CM | POA: Diagnosis present

## 2023-06-01 DIAGNOSIS — I10 Essential (primary) hypertension: Secondary | ICD-10-CM

## 2023-06-01 DIAGNOSIS — E8809 Other disorders of plasma-protein metabolism, not elsewhere classified: Secondary | ICD-10-CM

## 2023-06-01 DIAGNOSIS — Z8249 Family history of ischemic heart disease and other diseases of the circulatory system: Secondary | ICD-10-CM | POA: Diagnosis not present

## 2023-06-01 DIAGNOSIS — X58XXXA Exposure to other specified factors, initial encounter: Secondary | ICD-10-CM | POA: Diagnosis not present

## 2023-06-01 DIAGNOSIS — E46 Unspecified protein-calorie malnutrition: Secondary | ICD-10-CM | POA: Insufficient documentation

## 2023-06-01 DIAGNOSIS — Z79899 Other long term (current) drug therapy: Secondary | ICD-10-CM | POA: Diagnosis not present

## 2023-06-01 DIAGNOSIS — S72001A Fracture of unspecified part of neck of right femur, initial encounter for closed fracture: Secondary | ICD-10-CM | POA: Diagnosis not present

## 2023-06-01 DIAGNOSIS — Z955 Presence of coronary angioplasty implant and graft: Secondary | ICD-10-CM | POA: Diagnosis not present

## 2023-06-01 DIAGNOSIS — N3000 Acute cystitis without hematuria: Secondary | ICD-10-CM | POA: Diagnosis not present

## 2023-06-01 DIAGNOSIS — I2510 Atherosclerotic heart disease of native coronary artery without angina pectoris: Secondary | ICD-10-CM

## 2023-06-01 DIAGNOSIS — D649 Anemia, unspecified: Secondary | ICD-10-CM | POA: Diagnosis not present

## 2023-06-01 DIAGNOSIS — E782 Mixed hyperlipidemia: Secondary | ICD-10-CM | POA: Diagnosis not present

## 2023-06-01 DIAGNOSIS — Z8673 Personal history of transient ischemic attack (TIA), and cerebral infarction without residual deficits: Secondary | ICD-10-CM | POA: Diagnosis not present

## 2023-06-01 DIAGNOSIS — E876 Hypokalemia: Secondary | ICD-10-CM | POA: Diagnosis not present

## 2023-06-01 DIAGNOSIS — J440 Chronic obstructive pulmonary disease with acute lower respiratory infection: Secondary | ICD-10-CM

## 2023-06-01 DIAGNOSIS — Z7902 Long term (current) use of antithrombotics/antiplatelets: Secondary | ICD-10-CM | POA: Diagnosis not present

## 2023-06-01 DIAGNOSIS — I252 Old myocardial infarction: Secondary | ICD-10-CM | POA: Diagnosis not present

## 2023-06-01 DIAGNOSIS — F17210 Nicotine dependence, cigarettes, uncomplicated: Secondary | ICD-10-CM | POA: Diagnosis not present

## 2023-06-01 DIAGNOSIS — F4310 Post-traumatic stress disorder, unspecified: Secondary | ICD-10-CM | POA: Diagnosis not present

## 2023-06-01 DIAGNOSIS — J4489 Other specified chronic obstructive pulmonary disease: Secondary | ICD-10-CM | POA: Diagnosis not present

## 2023-06-01 DIAGNOSIS — S72011A Unspecified intracapsular fracture of right femur, initial encounter for closed fracture: Secondary | ICD-10-CM | POA: Diagnosis not present

## 2023-06-01 DIAGNOSIS — R4182 Altered mental status, unspecified: Secondary | ICD-10-CM | POA: Diagnosis present

## 2023-06-01 DIAGNOSIS — Z681 Body mass index (BMI) 19 or less, adult: Secondary | ICD-10-CM | POA: Diagnosis not present

## 2023-06-01 DIAGNOSIS — B9620 Unspecified Escherichia coli [E. coli] as the cause of diseases classified elsewhere: Secondary | ICD-10-CM | POA: Diagnosis not present

## 2023-06-01 DIAGNOSIS — Z853 Personal history of malignant neoplasm of breast: Secondary | ICD-10-CM | POA: Diagnosis not present

## 2023-06-01 HISTORY — DX: Urinary tract infection, site not specified: N39.0

## 2023-06-01 LAB — BLOOD CULTURE ID PANEL (REFLEXED) - BCID2

## 2023-06-01 LAB — URINALYSIS, W/ REFLEX TO CULTURE (INFECTION SUSPECTED)
Bilirubin Urine: NEGATIVE
Glucose, UA: NEGATIVE mg/dL
Ketones, ur: NEGATIVE mg/dL
Nitrite: NEGATIVE
Protein, ur: NEGATIVE mg/dL
Specific Gravity, Urine: 1.006 (ref 1.005–1.030)
pH: 6 (ref 5.0–8.0)

## 2023-06-01 LAB — CBC
HCT: 26.3 % — ABNORMAL LOW (ref 36.0–46.0)
Hemoglobin: 9 g/dL — ABNORMAL LOW (ref 12.0–15.0)
MCH: 28.4 pg (ref 26.0–34.0)
MCHC: 34.2 g/dL (ref 30.0–36.0)
MCV: 83 fL (ref 80.0–100.0)
Platelets: 230 10*3/uL (ref 150–400)
RBC: 3.17 MIL/uL — ABNORMAL LOW (ref 3.87–5.11)
RDW: 13.6 % (ref 11.5–15.5)
WBC: 8.4 10*3/uL (ref 4.0–10.5)
nRBC: 0 % (ref 0.0–0.2)

## 2023-06-01 LAB — CULTURE, BLOOD (ROUTINE X 2): Special Requests: ADEQUATE

## 2023-06-01 LAB — COMPREHENSIVE METABOLIC PANEL
ALT: 10 U/L (ref 0–44)
AST: 15 U/L (ref 15–41)
Albumin: 2.7 g/dL — ABNORMAL LOW (ref 3.5–5.0)
Alkaline Phosphatase: 56 U/L (ref 38–126)
Anion gap: 7 (ref 5–15)
BUN: 11 mg/dL (ref 8–23)
CO2: 28 mmol/L (ref 22–32)
Calcium: 8.8 mg/dL — ABNORMAL LOW (ref 8.9–10.3)
Chloride: 99 mmol/L (ref 98–111)
Creatinine, Ser: 0.82 mg/dL (ref 0.44–1.00)
GFR, Estimated: >60 mL/min (ref >60)
Glucose, Bld: 104 mg/dL — ABNORMAL HIGH (ref 70–99)
Potassium: 3.1 mmol/L — ABNORMAL LOW (ref 3.5–5.1)
Sodium: 134 mmol/L — ABNORMAL LOW (ref 135–145)
Total Bilirubin: 0.4 mg/dL (ref 0.3–1.2)
Total Protein: 6.1 g/dL — ABNORMAL LOW (ref 6.5–8.1)

## 2023-06-01 LAB — MAGNESIUM: Magnesium: 1.8 mg/dL (ref 1.7–2.4)

## 2023-06-01 LAB — PHOSPHORUS: Phosphorus: 3.3 mg/dL (ref 2.5–4.6)

## 2023-06-01 LAB — SARS CORONAVIRUS 2 BY RT PCR: SARS Coronavirus 2 by RT PCR: NEGATIVE

## 2023-06-01 LAB — LACTIC ACID, PLASMA: Lactic Acid, Venous: 1.2 mmol/L (ref 0.5–1.9)

## 2023-06-01 MED ORDER — ENOXAPARIN SODIUM 40 MG/0.4ML IJ SOSY
40.0000 mg | PREFILLED_SYRINGE | INTRAMUSCULAR | Status: DC
  Administered 2023-06-01 – 2023-06-03 (×3): 40 mg via SUBCUTANEOUS
  Filled 2023-06-01 (×3): qty 0.4

## 2023-06-01 MED ORDER — ONDANSETRON HCL 4 MG PO TABS: 4.0000 mg | ORAL_TABLET | Freq: Four times a day (QID) | ORAL | Status: DC | PRN

## 2023-06-01 MED ORDER — OFLOXACIN 0.3 % OP SOLN
1.0000 [drp] | Freq: Three times a day (TID) | OPHTHALMIC | Status: DC
  Filled 2023-06-01 (×2): qty 5

## 2023-06-01 MED ORDER — CARBAMAZEPINE 200 MG PO TABS
200.0000 mg | ORAL_TABLET | Freq: Two times a day (BID) | ORAL | Status: DC
  Administered 2023-06-01 – 2023-06-03 (×4): 200 mg via ORAL
  Filled 2023-06-01 (×4): qty 1

## 2023-06-01 MED ORDER — ACETAMINOPHEN 325 MG PO TABS
650.0000 mg | ORAL_TABLET | Freq: Four times a day (QID) | ORAL | Status: DC | PRN
  Administered 2023-06-03: 650 mg via ORAL
  Filled 2023-06-01: qty 2

## 2023-06-01 MED ORDER — POTASSIUM CHLORIDE CRYS ER 20 MEQ PO TBCR
40.0000 meq | EXTENDED_RELEASE_TABLET | Freq: Once | ORAL | Status: AC
  Administered 2023-06-01: 40 meq via ORAL
  Filled 2023-06-01: qty 2

## 2023-06-01 MED ORDER — SODIUM CHLORIDE 0.9 % IV SOLN
1.0000 g | Freq: Once | INTRAVENOUS | Status: AC
  Administered 2023-06-01: 1 g via INTRAVENOUS
  Filled 2023-06-01: qty 10

## 2023-06-01 MED ORDER — ENSURE ENLIVE PO LIQD
237.0000 mL | Freq: Two times a day (BID) | ORAL | Status: DC
  Administered 2023-06-01 – 2023-06-02 (×3): 237 mL via ORAL

## 2023-06-01 MED ORDER — SODIUM CHLORIDE 0.9 % IV SOLN
2.0000 g | INTRAVENOUS | Status: DC
  Administered 2023-06-02: 2 g via INTRAVENOUS
  Filled 2023-06-01: qty 20

## 2023-06-01 MED ORDER — SODIUM CHLORIDE 0.9 % IV SOLN
1.0000 g | INTRAVENOUS | Status: DC
  Administered 2023-06-01: 1 g via INTRAVENOUS
  Filled 2023-06-01: qty 10

## 2023-06-01 MED ORDER — OXYCODONE-ACETAMINOPHEN 5-325 MG PO TABS
1.0000 | ORAL_TABLET | ORAL | Status: DC | PRN
  Administered 2023-06-01: 1 via ORAL
  Filled 2023-06-01: qty 1

## 2023-06-01 MED ORDER — POTASSIUM CHLORIDE 10 MEQ/100ML IV SOLN
10.0000 meq | INTRAVENOUS | Status: AC
  Administered 2023-06-01 (×3): 10 meq via INTRAVENOUS
  Filled 2023-06-01 (×3): qty 100

## 2023-06-01 MED ORDER — ACETAMINOPHEN 650 MG RE SUPP: 650.0000 mg | Freq: Four times a day (QID) | RECTAL | Status: DC | PRN

## 2023-06-01 MED ORDER — ONDANSETRON HCL 4 MG/2ML IJ SOLN: 4.0000 mg | Freq: Four times a day (QID) | INTRAMUSCULAR | Status: DC | PRN

## 2023-06-01 NOTE — Evaluation (Signed)
Occupational Therapy Evaluation Patient Details Name: [name] MRN: [id number] DOB: 03-27-1956 Today's Date: 06/01/2023   History of Present Illness [name] is a 67 y.o. female with medical history significant of hypertension, hyperlipidemia, urinary incontinence, COPD, CAD who presents to the emergency department via EMS due to fever and altered mental status.  History was obtained from ED physician and son at bedside.  Per son, patient was usually alert, oriented x 3, communicates normally and able to ambulate with walker at baseline.  Yesterday, she was noted to be febrile with a temperature of 103F and she was confused.  Patient complained of right hip pain, she had hip surgery when admitted to this hospital from 3/11 to 3/18 due to acute comminuted/displaced/angulated intertrochanteric right femur fracture s/p  cephalic medullary nailing by Dr. Dallas Schimke on 01/29/2023.  She was to be discharged to a SNF at that time, but she declined and was discharged home with PT.  She denies chest pain, shortness of breath, burning sensation with urination, cough, recent falls, abdominal pain. (per DO)   Clinical Impression   Pt agreeable to OT and PT co-evaluation. Pt has 24/7 assist at baseline, per her report. Today pt required min G to min A for mobility mostly due to vision deficits and assist to guide RW in a safe direction. Mod I bed mobility and general weakness in B UE. Pt reports feeling alright being discharged home with home health services. Pt left in chair with call bell within reach and chair alarm set. Pt will benefit from continued OT in the hospital and recommended venue below to increase strength, balance, and endurance for safe ADL's.         Recommendations for follow up therapy are one component of a multi-disciplinary discharge planning process, led by the attending physician.  Recommendations may be updated based on patient status, additional functional criteria and insurance  authorization.   Assistance Recommended at Discharge Frequent or constant Supervision/Assistance  Patient can return home with the following A little help with walking and/or transfers;A little help with bathing/dressing/bathroom;Assistance with cooking/housework;Assist for transportation;Help with stairs or ramp for entrance    Functional Status Assessment  Patient has had a recent decline in their functional status and demonstrates the ability to make significant improvements in function in a reasonable and predictable amount of time.  Equipment Recommendations  None recommended by OT           Precautions / Restrictions Precautions Precautions: Fall Restrictions Weight Bearing Restrictions: No      Mobility Bed Mobility Overal bed mobility: Modified Independent             General bed mobility comments: Mild labored effort.    Transfers Overall transfer level: Needs assistance Equipment used: Rolling walker (2 wheels) Transfers: Sit to/from Stand, Bed to chair/wheelchair/BSC Sit to Stand: Supervision, Min guard     Step pivot transfers: Supervision, Min guard     General transfer comment: Labored movement; extended time; verbal cuing due to vision deficits.      Balance Overall balance assessment: Needs assistance Sitting-balance support: No upper extremity supported, Feet supported Sitting balance-Leahy Scale: Good Sitting balance - Comments: seated at EOB   Standing balance support: Bilateral upper extremity supported, During functional activity, Reliant on assistive device for balance Standing balance-Leahy Scale: Fair Standing balance comment: using RW  ADL either performed or assessed with clinical judgement   ADL Overall ADL's : Needs assistance/impaired     Grooming: Set up;Sitting   Upper Body Bathing: Set up;Sitting   Lower Body Bathing: Set up;Modified independent;Sitting/lateral leans       Lower  Body Dressing: Set up;Modified independent;Sitting/lateral leans   Toilet Transfer: Min guard;Minimal assistance;Rolling walker (2 wheels);Ambulation Toilet Transfer Details (indicate cue type and reason): Simulated via EOB to chair transfer and ambulation within the room. Toileting- Clothing Manipulation and Hygiene: Modified independent;Set up;Sitting/lateral lean       Functional mobility during ADLs: Min guard;Minimal assistance;Rolling walker (2 wheels) General ADL Comments: Able to ambulate in the hall with assist mostly to manage RW likely due to vision deficits.     Vision Baseline Vision/History: 3 Glaucoma;4 Cataracts Ability to See in Adequate Light: 3 Highly impaired Patient Visual Report: No change from baseline Additional Comments: highly impaired at baseline; pt previously reported being blind the last 6 years.                Pertinent Vitals/Pain Pain Assessment Pain Assessment: No/denies pain     Hand Dominance Right   Extremity/Trunk Assessment Upper Extremity Assessment Upper Extremity Assessment: Generalized weakness   Lower Extremity Assessment Lower Extremity Assessment: Defer to PT evaluation   Cervical / Trunk Assessment Cervical / Trunk Assessment: Kyphotic   Communication Communication Communication: No difficulties   Cognition Arousal/Alertness: Lethargic Behavior During Therapy: WFL for tasks assessed/performed Overall Cognitive Status: Within Functional Limits for tasks assessed                                                        Home Living Family/patient expects to be discharged to:: Private residence Living Arrangements: Children Available Help at Discharge: Family;Personal care attendant;Available 24 hours/day Type of Home: House Home Access: Ramped entrance     Home Layout: One level     Bathroom Shower/Tub: Producer, television/film/video: Handicapped height Bathroom Accessibility: Yes   Home  Equipment: Shower seat;Grab bars - tub/shower;Wheelchair - Forensic psychologist (2 wheels);BSC/3in1   Additional Comments: Pt reports same living set up as previously documented.      Prior Functioning/Environment Prior Level of Function : Needs assist             Mobility Comments: household and short distanced community ambulator with RW ADLs Comments: Independent ADL: assist IADL        OT Problem List: Decreased strength;Decreased activity tolerance;Impaired balance (sitting and/or standing)      OT Treatment/Interventions: Self-care/ADL training;Therapeutic exercise;Therapeutic activities;Patient/family education;Balance training;Visual/perceptual remediation/compensation    OT Goals(Current goals can be found in the care plan section) Acute Rehab OT Goals Patient Stated Goal: return home OT Goal Formulation: With patient Time For Goal Achievement: 06/15/23 Potential to Achieve Goals: Good  OT Frequency: Min 1X/week    Co-evaluation PT/OT/SLP Co-Evaluation/Treatment: Yes Reason for Co-Treatment: To address functional/ADL transfers   OT goals addressed during session: ADL's and self-care                       End of Session Equipment Utilized During Treatment: Rolling walker (2 wheels)  Activity Tolerance: Patient tolerated treatment well Patient left: in chair;with call bell/phone within reach;with chair alarm set  OT Visit Diagnosis: Unsteadiness on feet (R26.81);Other abnormalities  of gait and mobility (R26.89)                Time: 3244-0102 OT Time Calculation (min): 20 min Charges:  OT General Charges $OT Visit: 1 Visit OT Evaluation $OT Eval Low Complexity: 1 Low  Glendoris Nodarse OT, MOT  Danie Chandler 06/01/2023, 8:45 AM

## 2023-06-01 NOTE — TOC Initial Note (Signed)
Transition of Care [facility]) - Initial/Assessment Note    Patient Details  Name: [name] MRN: [id number] Date of Birth: 10-01-1956  [facility] CM/SW Contact:    Annice Needy, LCSW Phone Number: 06/01/2023, 1:27 PM  Clinical Narrative:                 Patient from home. Son lives with her. Admitted for UTI. Considered high risk for readmission. Active with [facility] for HHPT. Has active APS case with [facility], Musu Oswaldo Done is APS Child psychotherapist. Independent with ADLs. Has walker, cane, and wheelchair. Has an aide for 80 hours a month, seven days a week. Aide transports to medical appointments.   Expected Discharge Plan: Home w Home Health Services Barriers to Discharge: Continued Medical Work up   Patient Goals and CMS Choice            Expected Discharge Plan and Services                                              Prior Living Arrangements/Services     Patient language and need for interpreter reviewed:: Yes Do you feel safe going back to the place where you live?: Yes      Need for Family Participation in Patient Care: Yes (Comment) Care giver support system in place?: No (comment) Current home services: DME, Home PT (walker, cane, wc) Criminal Activity/Legal Involvement Pertinent to Current Situation/Hospitalization: No - Comment as needed  Activities of Daily Living Home Assistive Devices/Equipment: Cane (specify quad or straight) ADL Screening (condition at time of admission) Patient's cognitive ability adequate to safely complete daily activities?: Yes Is the patient deaf or have difficulty hearing?: No Does the patient have difficulty seeing, even when wearing glasses/contacts?: (S) Yes (partially blind on both eyes, no peripheral visison) Does the patient have difficulty concentrating, remembering, or making decisions?: No Patient able to express need for assistance with ADLs?: Yes Does the patient have difficulty  dressing or bathing?: Yes Independently performs ADLs?: Yes (appropriate for developmental age) Does the patient have difficulty walking or climbing stairs?: Yes Weakness of Legs: Right Weakness of Arms/Hands: None  Permission Sought/Granted Permission sought to share information with : Other (comment)    Share Information with NAME: Musu Vincent, APS social worker           Emotional Assessment     Affect (typically observed): Appropriate Orientation: : Oriented to Self, Oriented to Place, Oriented to  Time, Oriented to Situation Alcohol / Substance Use: Not Applicable Psych Involvement: No (comment)  Admission diagnosis:  UTI (urinary tract infection) [N39.0] Urinary tract infection without hematuria, site unspecified [N39.0] Patient Active Problem List   Diagnosis Date Noted   UTI (urinary tract infection) 06/01/2023   Acute metabolic encephalopathy 06/01/2023   Hypoalbuminemia due to protein-calorie malnutrition (HCC) 06/01/2023   Fracture of femoral neck, right, closed (HCC) 02/10/2023   Right femoral fracture (HCC) 01/27/2023   Failure to thrive in adult 10/18/2022   COPD (chronic obstructive pulmonary disease) (HCC) 10/18/2022   History of stroke 10/18/2022   Marijuana abuse    Hypokalemia 10/23/2018   Chronic nausea 05/31/2018   Malignant neoplasm of overlapping sites of left breast in female, estrogen receptor positive (HCC) 08/19/2017   Ductal carcinoma in situ (DCIS) of left breast 08/04/2017   Liver fibrosis 11/20/2015   Chronic hepatitis C  without hepatic coma (HCC) 07/25/2014   Vision changes 04/08/2012   Mixed hyperlipidemia 12/13/2011   ADJUSTMENT DISORDER WITH DEPRESSED MOOD 01/11/2009   CAD (coronary artery disease) 07/10/2008   TOBACCO DEPENDENCE 01/14/2007   Essential hypertension 01/14/2007   CONVULSIONS, SEIZURES, NOS 01/14/2007   PCP:  Celine Mans, MD Pharmacy:   [facility]  - [address] [place] [address] Phone: [phone] Fax: [phone]     Social Determinants of Health (SDOH) Social History: SDOH Screenings   Food Insecurity: No Food Insecurity (06/01/2023)  Housing: Patient Declined (06/01/2023)  Transportation Needs: No Transportation Needs (06/01/2023)  Utilities: Not At Risk (06/01/2023)  Depression (PHQ2-9): Medium Risk (12/14/2019)  Tobacco Use: High Risk (05/31/2023)   SDOH Interventions:     Readmission Risk Interventions     No data to display

## 2023-06-01 NOTE — Plan of Care (Signed)
  Problem: Acute Rehab PT Goals(only PT should resolve) Goal: Pt Will Go Supine/Side To Sit Outcome: Progressing Flowsheets (Taken 06/01/2023 0918) Pt will go Supine/Side to Sit:  with modified independence  with supervision Goal: Patient Will Transfer Sit To/From Stand Outcome: Progressing Flowsheets (Taken 06/01/2023 0918) Patient will transfer sit to/from stand: with supervision Goal: Pt Will Transfer Bed To Chair/Chair To Bed Outcome: Progressing Flowsheets (Taken 06/01/2023 0918) Pt will Transfer Bed to Chair/Chair to Bed: with supervision Goal: Pt Will Ambulate Outcome: Progressing Flowsheets (Taken 06/01/2023 0918) Pt will Ambulate:  75 feet  with supervision  with rolling walker   9:18 AM, 06/01/23 Ocie Bob, MPT Physical Therapist with [facility] [phone] office [phone] mobile phone

## 2023-06-01 NOTE — H&P (Signed)
History and Physical    Patient: [name] MVH:[id number] DOB: [birth year] DOA: 05/31/2023 DOS: the patient was seen and examined on 06/01/2023 PCP: Celine Mans, MD  Patient coming from: Home  Chief Complaint:  Chief Complaint  Patient presents with   Altered Mental Status    Pt is coming from home due to fever and altered mental status. Pt's son said she is normal alert and oriented, but currently is confused. Pt has a 103 temp on arrival. Pt is noncompliant with medications at home. Pt just hip surgery 3 months ago.    HPI: [name] is a 67 y.o. female with medical history significant of hypertension, hyperlipidemia, urinary incontinence, COPD, CAD who presents to the emergency department via EMS due to fever and altered mental status.  History was obtained from ED physician and son at bedside.  Per son, patient was usually alert, oriented x 3, communicates normally and able to ambulate with walker at baseline.  Yesterday, she was noted to be febrile with a temperature of 103F and she was confused. Patient complained of right hip pain, she had hip surgery when admitted to this hospital from 3/11 to 3/18 due to acute comminuted/displaced/angulated intertrochanteric right femur fracture s/p  cephalic medullary nailing by Dr. Dallas Schimke on 01/29/2023.  She was to be discharged to a SNF at that time, but she declined and was discharged home with PT. She denies chest pain, shortness of breath, burning sensation with urination, cough, recent falls, abdominal pain.  ED Course:  In the emergency department, temperature was 103F, BP was 180/94, other vital signs were within normal range.  Workup in the ED showed normocytic anemia, BMP showed sodium 134, potassium 2.7, chloride 97, bicarb 27, blood glucose 114, BUN 12, creatinine 0.85, albumin 3.0.  Urinalysis showed moderate leukocytes with 21-50 WBCs/hpf.  Lactic acid was normal.  SARS coronavirus 2 was negative, blood culture pending. CT of right  hip without contrast showed 1. Acute or subacute nondisplaced fracture of the subcapital right femoral neck fracture. 2. Right hip screw and intramedullary nail are in place. No evidence for hardware loosening. Right hip x-ray showed 1. New irregularity about the femoral head neck junction which may be projectional artifact or due to nondisplaced fracture. Consider CT for further evaluation. 2. IM rod and screw fixation across a healing intertrochanteric fracture of the right femur. Chest x-ray showed no active disease Patient was treated with IV ceftriaxone due to presumed UTI.  Tylenol was given due to fever, oxycodone was given due to right hip pain, potassium was replenished. Hospitalist was asked to admit patient for further evaluation and management.  Review of Systems: Review of systems as noted in the HPI. All other systems reviewed and are negative.   Past Medical History:  Diagnosis Date   Adjustment disorder with depressed mood 01/11/2009   Arthritis    Asthma    Breast cancer (HCC)    left breast   CAD 07/10/2008   Chronic back pain    COCAINE DEPENDENCY NOS 01/14/2007   stopped Cocaine 15 years    CONVULSIONS, SEIZURES, NOS 01/14/2007   last seizure May, 2016   COPD (chronic obstructive pulmonary disease) (HCC)    Coronary artery disease 01/14/2012   CVA 01/14/2007   Difficulty seeing    patient has glaucoma   Family history of anesthesia complication 1984   Pts sister (80 years old) died after having anesthesia - induced a heart attcks   Glaucoma of both eyes  Headache(784.0)    HEPATITIS C 02/26/2009   Hepatitis C    Hyperlipemia    HYPERTENSION, BENIGN SYSTEMIC 01/14/2007   Memory loss    d/t CVA   MI (myocardial infarction) (HCC) 12/20/2007   had stent placed   Pneumonia    hx of   PTSD (post-traumatic stress disorder)    Right sided weakness    d/t stroke   Sciatica    Stroke [facility])    right side weakness   TOBACCO DEPENDENCE 01/14/2007   Past  Surgical History:  Procedure Laterality Date   BREAST LUMPECTOMY Left 09/18/2017   Procedure: EVACUATION HEMATOMA  LEFT BREAST;  Surgeon: Abigail Miyamoto, MD;  Location: [facility] OR;  Service: General;  Laterality: Left;   CATARACT EXTRACTION W/PHACO Right 07/11/2015   Procedure: CATARACT EXTRACTION PHACO AND INTRAOCULAR LENS PLACEMENT (IOC) RIGHT,INTERNAL PLEB REVISION;  Surgeon: Chalmers Guest, MD;  Location: [facility] OR;  Service: Ophthalmology;  Laterality: Right;   CESAREAN SECTION     x 2   COLONOSCOPY W/ POLYPECTOMY     CORONARY ANGIOPLASTY WITH STENT PLACEMENT  2009   1 stent   EYE SURGERY     INTRAMEDULLARY (IM) NAIL INTERTROCHANTERIC Right 01/29/2023   Procedure: INTRAMEDULLARY (IM) NAIL INTERTROCHANTERIC;  Surgeon: Oliver Barre, MD;  Location: [facility] ORS;  Service: Orthopedics;  Laterality: Right;   MASTECTOMY W/ SENTINEL NODE BIOPSY Left 09/08/2017   Procedure: LEFT MASTECTOMY WITH SENTINEL LYMPH NODE BIOPSY;  Surgeon: Abigail Miyamoto, MD;  Location: [facility] OR;  Service: General;  Laterality: Left;   MITOMYCIN C APPLICATION Right 09/07/2013   Procedure: MITOMYCIN C APPLICATION RIGHT EYE;  Surgeon: Chalmers Guest, MD;  Location: [facility] OR;  Service: Ophthalmology;  Laterality: Right;   TONSILLECTOMY     TRABECULECTOMY Left 06/29/2013   Procedure: TRABECULECTOMY WITH MYTOMICIN C LEFT EYE;  Surgeon: Chalmers Guest, MD;  Location: [facility] OR;  Service: Ophthalmology;  Laterality: Left;   TRABECULECTOMY Right 09/07/2013   Procedure: TRABECULECTOMY RIGHT EYE;  Surgeon: Chalmers Guest, MD;  Location: [facility] OR;  Service: Ophthalmology;  Laterality: Right;    Social History:  reports that she has been smoking cigarettes. She has a 11.8 pack-year smoking history. She has never used smokeless tobacco. She reports that she does not drink alcohol and does not use drugs.   Allergies  Allergen Reactions   Bee Venom Anaphylaxis   Other     Patient states she has been told not to take ibuprofen and tylenol    Family History   Problem Relation Age of Onset   Stroke Mother    Colon cancer Paternal Uncle    Heart disease Sister    Colon cancer Paternal Aunt      Prior to Admission medications   Medication Sig Start Date End Date Taking? Authorizing Provider  albuterol (PROVENTIL HFA;VENTOLIN HFA) 108 (90 BASE) MCG/ACT inhaler Inhale 2 puffs into the lungs every 6 (six) hours as needed for wheezing. For wheezing    [provider]  amLODipine (NORVASC) 10 MG tablet Take 10 mg by mouth daily. 08/11/22   [provider]  aspirin 81 MG EC tablet Take 81 mg by mouth daily.      [provider]  atorvastatin (LIPITOR) 40 MG tablet Take 40 mg by mouth daily. 08/21/22   [provider]  carbamazepine (TEGRETOL) 200 MG tablet Take 200 mg by mouth 2 (two) times daily. 08/11/22   [provider]  carvedilol (COREG) 12.5 MG tablet Take 12.5 mg by mouth 2 (two)  times daily. 08/11/22   [provider]  clopidogrel (PLAVIX) 75 MG tablet Take 75 mg by mouth daily.     [provider]  lisinopril-hydrochlorothiazide (ZESTORETIC) 20-12.5 MG tablet Take 1 tablet by mouth 2 (two) times daily. 08/11/22   [provider]  nitroGLYCERIN (NITROSTAT) 0.4 MG SL tablet Place 0.4 mg under the tongue every 5 (five) minutes as needed for chest pain.     [provider]  oxyCODONE (OXY IR/ROXICODONE) 5 MG immediate release tablet Take 1 tablet (5 mg total) by mouth every 6 (six) hours as needed for moderate pain. 02/12/23   Vickki Hearing, MD  potassium chloride SA (K-DUR,KLOR-CON) 20 MEQ tablet Take 1 tablet (20 mEq total) by mouth daily. 10/24/18   Vassie Loll, MD  spironolactone (ALDACTONE) 25 MG tablet Take 25 mg by mouth daily. 10/28/22   [provider]    Physical Exam: BP (!) 168/94 (BP Location: Left Arm)   Pulse 67   Temp 98.6 F (37 C) (Oral)   Resp 16   Ht 5\' 4"  (1.626 m)   Wt 50.7 kg   SpO2 100%   BMI 19.19 kg/m   General: 67 y.o.  year-old female ill appearing, but in no acute distress.  Alert and oriented x3. HEENT: NCAT, EOMI Neck: Supple, trachea medial Cardiovascular: Regular rate and rhythm with no rubs or gallops.  No thyromegaly or JVD noted.  No lower extremity edema. 2/4 pulses in all 4 extremities. Respiratory: Clear to auscultation with no wheezes or rales. Good inspiratory effort. Abdomen: Soft, nontender nondistended with normal bowel sounds x4 quadrants. Muskuloskeletal: Tender to palpation of right hip/groin.  No cyanosis, clubbing or edema noted bilaterally Neuro: CN II-XII intact, sensation, reflexes intact Skin: No ulcerative lesions noted or rashes Psychiatry: Judgement and insight appear normal. Mood is appropriate for condition and setting          Labs on Admission:  Basic Metabolic Panel: Recent Labs  Lab 05/31/23 2237  NA 134*  K 2.7*  CL 97*  CO2 27  GLUCOSE 114*  BUN 12  CREATININE 0.85  CALCIUM 8.7*   Liver Function Tests: Recent Labs  Lab 05/31/23 2237  AST 17  ALT 10  ALKPHOS 62  BILITOT 0.6  PROT 6.6  ALBUMIN 3.0*   No results for input(s): "LIPASE", "AMYLASE" in the last 168 hours. No results for input(s): "AMMONIA" in the last 168 hours. CBC: Recent Labs  Lab 05/31/23 2237  WBC 8.5  NEUTROABS 6.4  HGB 9.4*  HCT 27.4*  MCV 83.0  PLT 248   Cardiac Enzymes: No results for input(s): "CKTOTAL", "CKMB", "CKMBINDEX", "TROPONINI" in the last 168 hours.  BNP (last 3 results) No results for input(s): "BNP" in the last 8760 hours.  ProBNP (last 3 results) No results for input(s): "PROBNP" in the last 8760 hours.  CBG: No results for input(s): "GLUCAP" in the last 168 hours.  Radiological Exams on Admission: CT Hip Right Wo Contrast  Result Date: 06/01/2023 CLINICAL DATA:  Hip pain, stress fracture suspected. Negative x-ray. EXAM: CT OF THE RIGHT HIP WITHOUT CONTRAST TECHNIQUE: Multidetector CT imaging of the right hip was performed according to the standard  protocol. Multiplanar CT image reconstructions were also generated. RADIATION DOSE REDUCTION: This exam was performed according to the departmental dose-optimization program which includes automated exposure control, adjustment of the mA and/or kV according to patient size and/or use of iterative reconstruction technique. COMPARISON:  None Available. FINDINGS: Bones/Joint/Cartilage The bones are osteopenic. Right  hip screw and intramedullary nail are present. There is no evidence for hardware loosening. There is buckling at the subcapital level of the right femoral neck which is new from 04/05/2013. This is subtle and nondisplaced favored as acute or subacute fracture. Ligaments Suboptimally assessed by CT. Muscles and Tendons Within normal limits. Soft tissues No focal hematoma or fluid collection. Vascular calcifications are present. IMPRESSION: 1. Acute or subacute nondisplaced fracture of the subcapital right femoral neck fracture. 2. Right hip screw and intramedullary nail are in place. No evidence for hardware loosening. Electronically Signed   By: Darliss Cheney M.D.   On: 06/01/2023 02:51   DG HIP UNILAT WITH PELVIS 2-3 VIEWS RIGHT  Result Date: 06/01/2023 CLINICAL DATA:  Right hip pain EXAM: DG HIP (WITH OR WITHOUT PELVIS) 2-3V RIGHT COMPARISON:  Radiographs 04/14/2023 FINDINGS: IM rod and screw fixation across the right femur intertrochanteric fracture. Similar mild displacement of the lesser trochanter. No radiographic evidence of loosening. There is new irregularity about the femoral head neck junction since 04/14/2023. IMPRESSION: 1. New irregularity about the femoral head neck junction which may be projectional artifact or due to nondisplaced fracture. Consider CT for further evaluation. 2. IM rod and screw fixation across a healing intertrochanteric fracture of the right femur. Electronically Signed   By: Minerva Fester M.D.   On: 06/01/2023 00:45   DG Chest Port 1 View  Result Date:  05/31/2023 CLINICAL DATA:  Questionable sepsis-evaluate for abnormality EXAM: PORTABLE CHEST 1 VIEW COMPARISON:  Radiographs 01/27/2023 FINDINGS: Stable cardiomediastinal silhouette. Aortic atherosclerotic calcification. No focal consolidation, pleural effusion, or pneumothorax. No displaced rib fractures. IMPRESSION: No active disease. Electronically Signed   By: Minerva Fester M.D.   On: 05/31/2023 23:08    EKG: I independently viewed the EKG done and my findings are as followed: Normal sinus rhythm at a rate of 93 bpm  Assessment/Plan Present on Admission:  UTI (urinary tract infection)  Mixed hyperlipidemia  Hypokalemia  Fracture of femoral neck, right, closed (HCC)  COPD (chronic obstructive pulmonary disease) (HCC)  CAD (coronary artery disease)  Principal Problem:   UTI (urinary tract infection) Active Problems:   CAD (coronary artery disease)   Mixed hyperlipidemia   Hypokalemia   COPD (chronic obstructive pulmonary disease) (HCC)   History of stroke   Fracture of femoral neck, right, closed (HCC)   Acute metabolic encephalopathy   Hypoalbuminemia due to protein-calorie malnutrition (HCC)  Acute metabolic encephalopathy possibly secondary to presumed UTI POA Patient was started on IV ceftriaxone, we shall continue with same at this time with plan to de-escalate/discontinue based on urine culture. Continue Tylenol as needed  Acute/subacute nondisplaced fracture of the subcapital right femoral neck fracture. CT of right hip was suggestive of nondisplaced fracture of subcapital right femoral neck fracture Continue Tylenol as needed for mild pain Continue oxycodone as needed for moderate/severe pain Continue PT/OT eval and treat Orthopedic surgeon will be consulted and we shall await further recommendations  Hypokalemia K+ 2.7  Hypoalbuminemia possibly secondary to moderate protein calorie malnutrition Albumin 3.0, protein supplement will be provided  Essential  hypertension Continue home lisinopril/HCTZ, carvedilol, amlodipine   Mixed hyperlipidemia Atorvastatin 40 mg p.o. daily   COPD Not oxygen dependent at baseline Continue albuterol as needed   CAD History of CVA Continue statin, aspirin, Plavix   DVT prophylaxis: Lovenox  Advance Care Planning: Full code  Consults: None  Family Communication: Son and daughter at bedside (all questions answered to satisfaction)  Severity of Illness: The appropriate patient  status for this patient is INPATIENT. Inpatient status is judged to be reasonable and necessary in order to provide the required intensity of service to ensure the patient's safety. The patient's presenting symptoms, physical exam findings, and initial radiographic and laboratory data in the context of their chronic comorbidities is felt to place them at high risk for further clinical deterioration. Furthermore, it is not anticipated that the patient will be medically stable for discharge from the hospital within 2 midnights of admission.   * I certify that at the point of admission it is my clinical judgment that the patient will require inpatient hospital care spanning beyond 2 midnights from the point of admission due to high intensity of service, high risk for further deterioration and high frequency of surveillance required.*  Author: Frankey Shown, DO 06/01/2023 5:19 AM  For on call review [email or web address].

## 2023-06-01 NOTE — Progress Notes (Signed)
Danielle Tovano called from [facility] and stated that patient blood cultures are positive for e. Coli. Notified Dr. Jarvis Newcomer and Evlyn Kanner, RN.

## 2023-06-01 NOTE — Plan of Care (Signed)
  Problem: Acute Rehab OT Goals (only OT should resolve) Goal: Pt. Will Perform Grooming Flowsheets (Taken 06/01/2023 0847) Pt Will Perform Grooming:  with modified independence  standing Goal: Pt. Will Transfer To Toilet Flowsheets (Taken 06/01/2023 0847) Pt Will Transfer to Toilet:  with modified independence  ambulating Goal: Pt/Caregiver Will Perform Home Exercise Program Flowsheets (Taken 06/01/2023 0847) Pt/caregiver will Perform Home Exercise Program:  Increased strength  Both right and left upper extremity  Independently  Lilliann Rossetti OT, MOT

## 2023-06-01 NOTE — Progress Notes (Addendum)
   ORTHOPAEDIC PROGRESS NOTE  s/p Right CMN for intertrochanteric femur fracture  DOS: 01/29/23   SUBJECTIVE: [name] has been admitted for a UTI.  She was also complaining of right hip pain.  No falls.  She uses a walker.  She sleeps on her right side, but no recent injury.  She has had pain for the past 2 weeks.  She is still able to walk.    OBJECTIVE: PE:  Thing female  Incisions are healed No tenderness lateral No pain in the groin with axial loading, or gentle hip ROM. Active motion in TA/EHL Sensation intact distally.   Vitals:   06/01/23 0507 06/01/23 0900  BP:  (!) 152/85  Pulse:  66  Resp:  17  Temp:  98.5 F (36.9 C)  SpO2: 100% 100%      Latest Ref Rng & Units 06/01/2023    6:14 AM 05/31/2023   10:37 PM 02/11/2023   12:42 PM  CBC  WBC 4.0 - 10.5 K/uL 8.4  8.5  8.4   Hemoglobin 12.0 - 15.0 g/dL 9.0  9.4  78.2   Hematocrit 36.0 - 46.0 % 26.3  27.4  31.8   Platelets 150 - 400 K/uL 230  248  586    XR and CT scan of the right demonstrate a well positioned cephalomedullary nail.  No screw cutout.  No change in alignment of nail and lag screw.  IT fracture is healed.  Non displaced fracture in the subcapital region, unknown chronicity.  No evidence of AVN   ASSESSMENT: [name] is a 67 y.o. female s/p R CMN for IT fracture with UTI and complaints of groin pain.  Radiographs demonstrate acute or subacute fracture in subcapital femoral neck.   PLAN: Weightbearing: WBAT RLE using walker at all times.  Incisional and dressing care: as needed Orthopedic device(s): None VTE prophylaxis: as needed Pain control: as needed Follow - up plan: 2-3 weeks   Contact information:     Sheilyn Boehlke A. Dallas Schimke, MD MS [facility] [address] [place],  [place]  [address] Phone: [phone] Fax: [phone]

## 2023-06-01 NOTE — Evaluation (Signed)
Physical Therapy Evaluation Patient Details Name: ARLI BREE MRN: [id number] DOB: 1956-04-18 Today's Date: 06/01/2023  History of Present Illness  CECIL BIXBY is a 67 y.o. female with medical history significant of hypertension, hyperlipidemia, urinary incontinence, COPD, CAD who presents to the emergency department via EMS due to fever and altered mental status.  History was obtained from ED physician and son at bedside.  Per son, patient was usually alert, oriented x 3, communicates normally and able to ambulate with walker at baseline.  Yesterday, she was noted to be febrile with a temperature of 103F and she was confused.  Patient complained of right hip pain, she had hip surgery when admitted to this hospital from 3/11 to 3/18 due to acute comminuted/displaced/angulated intertrochanteric right femur fracture s/p  cephalic medullary nailing by Dr. Dallas Schimke on 01/29/2023.  She was to be discharged to a SNF at that time, but she declined and was discharged home with PT.  She denies chest pain, shortness of breath, burning sensation with urination, cough, recent falls, abdominal pain.   Clinical Impression  Patient demonstrates increased time with slightly labored movement for sitting up at bedside, good return for ambulating in room/hallway using RW without loss of balance and tolerated sitting up in chair after therapy - nursing staff aware.  Patient will benefit from continued skilled physical therapy in hospital and recommended venue below to increase strength, balance, endurance for safe ADLs and gait.         Assistance Recommended at Discharge Set up Supervision/Assistance  If plan is discharge home, recommend the following:  Can travel by private vehicle  A little help with walking and/or transfers;A little help with bathing/dressing/bathroom;Help with stairs or ramp for entrance;Assistance with cooking/housework        Equipment Recommendations None recommended by PT  Recommendations  for Other Services       Functional Status Assessment Patient has had a recent decline in their functional status and demonstrates the ability to make significant improvements in function in a reasonable and predictable amount of time.     Precautions / Restrictions Precautions Precautions: Fall Restrictions Weight Bearing Restrictions: No Other Position/Activity Restrictions: decreased eye sight      Mobility  Bed Mobility Overal bed mobility: Modified Independent             General bed mobility comments: increased time with slightly labored movement    Transfers Overall transfer level: Needs assistance Equipment used: Rolling walker (2 wheels) Transfers: Sit to/from Stand, Bed to chair/wheelchair/BSC Sit to Stand: Supervision, Min guard   Step pivot transfers: Supervision, Min guard       General transfer comment: increased time, labored movement    Ambulation/Gait Ambulation/Gait assistance: Min guard, Min assist Gait Distance (Feet): 50 Feet Assistive device: Rolling walker (2 wheels) Gait Pattern/deviations: Decreased step length - right, Decreased step length - left, Decreased stride length Gait velocity: decreased     General Gait Details: slow labored cadence without loss of balance, occasional verbal cueing to step closer to RW with fair/good carryover, limited mostly due to c/o fatigue  Stairs            Wheelchair Mobility     Tilt Bed    Modified Rankin (Stroke Patients Only)       Balance Overall balance assessment: Needs assistance Sitting-balance support: Feet supported, No upper extremity supported Sitting balance-Leahy Scale: Good Sitting balance - Comments: seated at EOB   Standing balance support: Bilateral upper extremity supported, During functional  activity, Reliant on assistive device for balance Standing balance-Leahy Scale: Fair Standing balance comment: using RW                             Pertinent  Vitals/Pain Pain Assessment Pain Assessment: No/denies pain    Home Living Family/patient expects to be discharged to:: Private residence Living Arrangements: Children Available Help at Discharge: Family;Personal care attendant;Available 24 hours/day Type of Home: House Home Access: Ramped entrance       Home Layout: One level Home Equipment: Shower seat;Grab bars - tub/shower;Wheelchair - Forensic psychologist (2 wheels);BSC/3in1 Additional Comments: Pt reports same living set up as previously documented.    Prior Function Prior Level of Function : Needs assist             Mobility Comments: household and short distanced community ambulator with RW ADLs Comments: Independent ADL: assist IADL     Hand Dominance   Dominant Hand: Right    Extremity/Trunk Assessment   Upper Extremity Assessment Upper Extremity Assessment: Defer to OT evaluation    Lower Extremity Assessment Lower Extremity Assessment: Generalized weakness    Cervical / Trunk Assessment Cervical / Trunk Assessment: Kyphotic  Communication   Communication: No difficulties  Cognition Arousal/Alertness: Awake/alert, Lethargic Behavior During Therapy: WFL for tasks assessed/performed Overall Cognitive Status: Within Functional Limits for tasks assessed                                 General Comments: slightly lethargic and able follow directions consistently        General Comments      Exercises     Assessment/Plan    PT Assessment Patient needs continued PT services  PT Problem List Decreased strength;Decreased activity tolerance;Decreased balance;Decreased mobility       PT Treatment Interventions DME instruction;Gait training;Stair training;Functional mobility training;Therapeutic activities;Therapeutic exercise;Balance training;Patient/family education    PT Goals (Current goals can be found in the Care Plan section)  Acute Rehab PT Goals Patient Stated Goal: return  home with family to assist PT Goal Formulation: With patient Time For Goal Achievement: 06/04/23 Potential to Achieve Goals: Good    Frequency Min 3X/week     Co-evaluation PT/OT/SLP Co-Evaluation/Treatment: Yes Reason for Co-Treatment: To address functional/ADL transfers PT goals addressed during session: Mobility/safety with mobility;Balance;Proper use of DME OT goals addressed during session: ADL's and self-care       AM-PAC PT "6 Clicks" Mobility  Outcome Measure Help needed turning from your back to your side while in a flat bed without using bedrails?: None Help needed moving from lying on your back to sitting on the side of a flat bed without using bedrails?: A Little Help needed moving to and from a bed to a chair (including a wheelchair)?: A Little Help needed standing up from a chair using your arms (e.g., wheelchair or bedside chair)?: A Little Help needed to walk in hospital room?: A Little Help needed climbing 3-5 steps with a railing? : A Lot 6 Click Score: 18    End of Session   Activity Tolerance: Patient tolerated treatment well;Patient limited by fatigue Patient left: in chair;with call bell/phone within reach;with chair alarm set Nurse Communication: Mobility status PT Visit Diagnosis: Unsteadiness on feet (R26.81);Other abnormalities of gait and mobility (R26.89);Muscle weakness (generalized) (M62.81)    Time: 1610-9604 PT Time Calculation (min) (ACUTE ONLY): 24 min   Charges:  PT Evaluation $PT Eval Moderate Complexity: 1 Mod PT Treatments $Therapeutic Activity: 23-37 mins PT General Charges $$ ACUTE PT VISIT: 1 Visit         9:17 AM, 06/01/23 Ocie Bob, MPT Physical Therapist with [facility] [phone] office [phone] mobile phone

## 2023-06-01 NOTE — Progress Notes (Addendum)
[facility] PROGRESS NOTE  [name] (DOB: [birth year]) JYN:[id number] PCP: Celine Mans, MD  Brief Narrative: [name] is a 67 y.o. female with a history of intertrochanteric right hip fracture s/p IM nail 01/29/2023, CAD, COPD, HTN, HLD, urinary incontinence who presented to the ED on 05/31/2023 from home due to fever to 103F and confusion. She was admitted this morning for acute encephalopathy due to UTI with acute-subacute nondisplaced right femoral neck fracture.   Subjective: Feels better, stating right hip pain is improved. She's eating some breakfast this morning. No new complaints.   Objective: BP (!) 152/85 (BP Location: Left Arm)   Pulse 66   Temp 98.5 F (36.9 C) (Oral)   Resp 17   Ht 5\' 4"  (1.626 m)   Wt 50.7 kg   SpO2 100%   BMI 19.19 kg/m   Gen: Pleasant 66yo F in no distress appearing older than stated age HEENT: Pinguecula bilaterally Pulm: Clear, nonlabored  CV: RRR, no MRG GI: Soft, NT, ND, +BS  Neuro: Alert and interactive, somewhat oriented. No new focal deficits. Ext: Warm, no deformities. Tenderness to right hip palpation and some ROM, mild. Skin: No acute rashes, lesions or ulcers on visualized skin   Assessment & Plan: Acute metabolic encephalopathy: Suspected to be precipitated by UTI.  - Abx as below - Monitor mentation, delirium precautions  Fever, UTI: 21-50 WBCs/HPF on clean catch, +rare bacteria. WBC 8.5k, Lactic acid 1.1 > 1.2.  - Continue with ceftriaxone 1g IV q24h pending urine culture (NGTD at this time)  - Monitor blood cultures as well given fever.   Acute-subacute nondisplaced subcapital right femoral neck fracture: Pt unable to recall a specific inciting event but does have pain. Right hip screw and nail in place without evidence of hardware loosening.  - Hold plavix pending orthopedics evaluation in the event operative management is recommended. Consult w/Dr. Romeo Apple requested. - PT evaluation - Tx pain w/po  medications as able.   Hypokalemia: Supplemented. Improved, will repeat supplementation.  Otherwise, per H&P this AM by Dr. Thomes Dinning.  Tyrone Nine, MD [facility] [email or web address] 06/01/2023, 9:31 AM

## 2023-06-02 DIAGNOSIS — S72001A Fracture of unspecified part of neck of right femur, initial encounter for closed fracture: Secondary | ICD-10-CM | POA: Diagnosis not present

## 2023-06-02 LAB — URINE CULTURE

## 2023-06-02 MED ORDER — SPIRONOLACTONE 25 MG PO TABS
25.0000 mg | ORAL_TABLET | Freq: Every day | ORAL | Status: DC
  Administered 2023-06-02 – 2023-06-03 (×2): 25 mg via ORAL
  Filled 2023-06-02 (×2): qty 1

## 2023-06-02 MED ORDER — POTASSIUM CHLORIDE CRYS ER 20 MEQ PO TBCR
40.0000 meq | EXTENDED_RELEASE_TABLET | Freq: Once | ORAL | Status: AC
  Administered 2023-06-02: 40 meq via ORAL
  Filled 2023-06-02: qty 2

## 2023-06-02 MED ORDER — CLOPIDOGREL BISULFATE 75 MG PO TABS
75.0000 mg | ORAL_TABLET | Freq: Every day | ORAL | Status: DC
  Administered 2023-06-02 – 2023-06-03 (×2): 75 mg via ORAL
  Filled 2023-06-02 (×2): qty 1

## 2023-06-02 MED ORDER — AMLODIPINE BESYLATE 5 MG PO TABS
10.0000 mg | ORAL_TABLET | Freq: Every day | ORAL | Status: DC
  Administered 2023-06-02 – 2023-06-03 (×2): 10 mg via ORAL
  Filled 2023-06-02 (×2): qty 2

## 2023-06-02 MED ORDER — ALBUTEROL SULFATE (2.5 MG/3ML) 0.083% IN NEBU: 2.5000 mg | INHALATION_SOLUTION | Freq: Four times a day (QID) | RESPIRATORY_TRACT | Status: DC | PRN

## 2023-06-02 MED ORDER — ASPIRIN 81 MG PO TBEC
81.0000 mg | DELAYED_RELEASE_TABLET | Freq: Every day | ORAL | Status: DC
  Administered 2023-06-02 – 2023-06-03 (×2): 81 mg via ORAL
  Filled 2023-06-02 (×2): qty 1

## 2023-06-02 MED ORDER — ALBUTEROL SULFATE HFA 108 (90 BASE) MCG/ACT IN AERS: 2.0000 | INHALATION_SPRAY | Freq: Four times a day (QID) | RESPIRATORY_TRACT | Status: DC | PRN

## 2023-06-02 MED ORDER — LISINOPRIL-HYDROCHLOROTHIAZIDE 20-12.5 MG PO TABS: 1.0000 | ORAL_TABLET | Freq: Two times a day (BID) | ORAL | Status: DC

## 2023-06-02 MED ORDER — CARVEDILOL 12.5 MG PO TABS
12.5000 mg | ORAL_TABLET | Freq: Two times a day (BID) | ORAL | Status: DC
  Administered 2023-06-02 – 2023-06-03 (×3): 12.5 mg via ORAL
  Filled 2023-06-02 (×3): qty 1

## 2023-06-02 MED ORDER — ATORVASTATIN CALCIUM 40 MG PO TABS
40.0000 mg | ORAL_TABLET | Freq: Every day | ORAL | Status: DC
  Administered 2023-06-02 – 2023-06-03 (×2): 40 mg via ORAL
  Filled 2023-06-02 (×2): qty 1

## 2023-06-02 NOTE — Progress Notes (Signed)
[facility] PROGRESS NOTE  [name] (DOB: 1956/09/13) ZOX:[id number] PCP: Celine Mans, MD  Brief Narrative: [name] is a 67 y.o. female with a history of intertrochanteric right hip fracture s/p IM nail 01/29/2023, CAD, COPD, HTN, HLD, urinary incontinence who presented to the ED on 05/31/2023 from home due to fever to 103F and confusion. She was admitted this morning for acute encephalopathy due to UTI with acute-subacute nondisplaced right femoral neck fracture.   Subjective: No eye pain, no chest pain or dyspnea, no swelling. Eating better. Is aware she's not thinking clearly, denies abd pain.  Objective: BP (!) 152/74 (BP Location: Left Arm)   Pulse 77   Temp 97.8 F (36.6 C) (Oral)   Resp 19   Ht 5\' 4"  (1.626 m)   Wt 50.7 kg   SpO2 99%   BMI 19.19 kg/m   Gen: No distress, appears older than stated age Pulm: Clear, nonlabored  CV: RRR, no MRG or pitting edema GI: Soft, NT, ND, +BS Neuro: Alert and interactive, more oriented but incompletely. No new focal deficits. Ext: Warm, no deformities Skin: No new rashes, lesions or ulcers on visualized skin   Assessment & Plan: Acute metabolic encephalopathy: Suspected to be precipitated by UTI/bacteremia - Abx as below - Monitor mentation, delirium precautions (improving)  E. coli UTI and bacteremia:   - Continue with ceftriaxone 2g IV q24h pending culture susceptibility testing. Plan 7 days total therapy.  Acute-subacute nondisplaced subcapital right femoral neck fracture: Pt unable to recall a specific inciting event but does have pain. Right hip screw and nail in place without evidence of hardware loosening.  - Will restart plavix with no operative plans.  - WBAT per orthopedics, Dr. Dallas Schimke. Use walker at all times. - PT evaluation - Tx pain w/po medications as able.   Hypokalemia: Supplemented. - Restart spironolactone, hold HCTZ, repeat supplementation.  CAD: No angina - Continue DAPT,  statin  History of CVA:  - Continue DAPT, statin  HLD:  - Continue statin  HTN:  - Restart norvasc, coreg, spiro  Pinguecula: Continue gtt's  Tyrone Nine, MD [facility] [email or web address] 06/02/2023, 9:00 AM

## 2023-06-02 NOTE — Plan of Care (Signed)

## 2023-06-02 NOTE — Plan of Care (Signed)
  Problem: Education: Goal: Knowledge of General Education information will improve Description: Including pain rating scale, medication(s)/side effects and non-pharmacologic comfort measures 06/02/2023 0700 by Jacqualine Code, RN Outcome: Progressing 06/02/2023 0700 by Jacqualine Code, RN Outcome: Progressing   Problem: Health Behavior/Discharge Planning: Goal: Ability to manage health-related needs will improve 06/02/2023 0700 by Jacqualine Code, RN Outcome: Progressing 06/02/2023 0700 by Jacqualine Code, RN Outcome: Progressing   Problem: Clinical Measurements: Goal: Ability to maintain clinical measurements within normal limits will improve 06/02/2023 0700 by Jacqualine Code, RN Outcome: Progressing 06/02/2023 0700 by Jacqualine Code, RN Outcome: Progressing Goal: Will remain free from infection 06/02/2023 0700 by Jacqualine Code, RN Outcome: Progressing 06/02/2023 0700 by Jacqualine Code, RN Outcome: Progressing Goal: Diagnostic test results will improve 06/02/2023 0700 by Jacqualine Code, RN Outcome: Progressing 06/02/2023 0700 by Jacqualine Code, RN Outcome: Progressing Goal: Respiratory complications will improve 06/02/2023 0700 by Jacqualine Code, RN Outcome: Progressing 06/02/2023 0700 by Jacqualine Code, RN Outcome: Progressing Goal: Cardiovascular complication will be avoided 06/02/2023 0700 by Jacqualine Code, RN Outcome: Progressing 06/02/2023 0700 by Jacqualine Code, RN Outcome: Progressing   Problem: Activity: Goal: Risk for activity intolerance will decrease 06/02/2023 0700 by Jacqualine Code, RN Outcome: Progressing 06/02/2023 0700 by Jacqualine Code, RN Outcome: Progressing   Problem: Nutrition: Goal: Adequate nutrition will be maintained 06/02/2023 0700 by Jacqualine Code, RN Outcome: Progressing 06/02/2023 0700 by Jacqualine Code, RN Outcome: Progressing   Problem: Coping: Goal: Level of anxiety will decrease 06/02/2023 0700 by Jacqualine Code, RN Outcome: Progressing 06/02/2023 0700 by Jacqualine Code, RN Outcome: Progressing   Problem: Elimination: Goal: Will not experience complications related to bowel motility 06/02/2023 0700 by Jacqualine Code, RN Outcome: Progressing 06/02/2023 0700 by Jacqualine Code, RN Outcome: Progressing Goal: Will not experience complications related to urinary retention 06/02/2023 0700 by Jacqualine Code, RN Outcome: Progressing 06/02/2023 0700 by Jacqualine Code, RN Outcome: Progressing   Problem: Pain Managment: Goal: General experience of comfort will improve 06/02/2023 0700 by Jacqualine Code, RN Outcome: Progressing 06/02/2023 0700 by Jacqualine Code, RN Outcome: Progressing   Problem: Safety: Goal: Ability to remain free from injury will improve 06/02/2023 0700 by Jacqualine Code, RN Outcome: Progressing 06/02/2023 0700 by Jacqualine Code, RN Outcome: Progressing   Problem: Skin Integrity: Goal: Risk for impaired skin integrity will decrease 06/02/2023 0700 by Jacqualine Code, RN Outcome: Progressing 06/02/2023 0700 by Jacqualine Code, RN Outcome: Progressing

## 2023-06-03 DIAGNOSIS — S72009A Fracture of unspecified part of neck of unspecified femur, initial encounter for closed fracture: Secondary | ICD-10-CM

## 2023-06-03 DIAGNOSIS — N3000 Acute cystitis without hematuria: Secondary | ICD-10-CM | POA: Diagnosis not present

## 2023-06-03 LAB — CULTURE, BLOOD (ROUTINE X 2): Special Requests: ADEQUATE

## 2023-06-03 LAB — URINE CULTURE: Culture: >=100000 — AB

## 2023-06-03 MED ORDER — CIPROFLOXACIN HCL 500 MG PO TABS: 500.0000 mg | ORAL_TABLET | Freq: Two times a day (BID) | ORAL | 0 refills | Status: AC

## 2023-06-03 MED ORDER — AMLODIPINE BESYLATE 10 MG PO TABS: 10.0000 mg | ORAL_TABLET | Freq: Every day | ORAL | 2 refills | Status: AC

## 2023-06-03 MED ORDER — OXYCODONE HCL 5 MG PO TABS
5.0000 mg | ORAL_TABLET | Freq: Four times a day (QID) | ORAL | 0 refills | Status: AC | PRN
Start: 2023-06-03 — End: 2023-06-06

## 2023-06-03 MED ORDER — RISAQUAD PO CAPS
2.0000 | ORAL_CAPSULE | Freq: Three times a day (TID) | ORAL | Status: DC
  Administered 2023-06-03: 2 via ORAL
  Filled 2023-06-03: qty 2

## 2023-06-03 MED ORDER — CIPROFLOXACIN HCL 250 MG PO TABS
500.0000 mg | ORAL_TABLET | Freq: Two times a day (BID) | ORAL | Status: DC
  Administered 2023-06-03: 500 mg via ORAL
  Filled 2023-06-03: qty 2

## 2023-06-03 NOTE — Plan of Care (Signed)
  Problem: Education: Goal: Knowledge of General Education information will improve Description: Including pain rating scale, medication(s)/side effects and non-pharmacologic comfort measures Outcome: Not Progressing   Problem: Health Behavior/Discharge Planning: Goal: Ability to manage health-related needs will improve Outcome: Not Progressing   

## 2023-06-03 NOTE — Discharge Summary (Signed)
Physician Discharge Summary   Patient: [name] MRN: [id number] DOB: [birth year]  Admit date:     05/31/2023  Discharge date: 06/03/23  Discharge Physician: Kendell Bane   PCP: Celine Mans, MD   Recommendations at discharge:    Continue current medications. PT OT, fall precaution Follow-up with orthopedic physician within 1 week PCP within 1-2 weeks  Discharge Diagnoses: Principal Problem:   UTI (urinary tract infection) Active Problems:   CAD (coronary artery disease)   Mixed hyperlipidemia   Hypokalemia   COPD (chronic obstructive pulmonary disease) (HCC)   History of stroke   Fracture of femoral neck, right, closed (HCC)   Acute metabolic encephalopathy   Hypoalbuminemia due to protein-calorie malnutrition (HCC)  Resolved Problems:   * No resolved hospital problems. *  Acute metabolic encephalopathy: Suspected to be precipitated by UTI/bacteremia - Abx as below - Monitor mentation, delirium precautions -improved   E. coli UTI and bacteremia:   - Was on ceftriaxone 2g IV q24h -sensitive to quinolone switch to p.o. ciprofloxacin for 5 more days  Acute-subacute nondisplaced subcapital right femoral neck fracture: Pt unable to recall a specific inciting event but does have pain. Right hip screw and nail in place without evidence of hardware loosening.  - Was restart plavix with no operative plans.  - WBAT per orthopedics, Dr. Dallas Schimke. Use walker at all times. - PT evaluation - Tx pain w/po medications as able. -Arrange for home health PT, pain medication, walker, fall precautions Follow-up as an outpatient with orthopedic team   Hypokalemia: Supplemented. - Restart spironolactone, D/Ced HCTZ,   CAD: No angina - Continue DAPT, statin   History of CVA:  - Continue DAPT, statin   HLD:  - Continue statin   HTN:  - Restart norvasc, coreg, spiro    Pain control - [place] Controlled Substance Reporting System database was reviewed. and patient  was instructed, not to drive, operate heavy machinery, perform activities at heights, swimming or participation in water activities or provide baby-sitting services while on Pain, Sleep and Anxiety Medications; until their outpatient Physician has advised to do so again. Also recommended to not to take more than prescribed Pain, Sleep and Anxiety Medications.  Consultants:Ortho Procedures performed: none   Disposition: Home health Diet recommendation:  Discharge Diet Orders (From admission, onward)     Start     Ordered   06/03/23 0000  Diet - low sodium heart healthy        06/03/23 0956           Carb modified diet DISCHARGE MEDICATION: Allergies as of 06/03/2023       Reactions   Bee Venom Anaphylaxis   Other    Patient states she has been told not to take ibuprofen and tylenol        Medication List     STOP taking these medications    lisinopril-hydrochlorothiazide 20-12.5 MG tablet Commonly known as: ZESTORETIC       TAKE these medications    albuterol 108 (90 Base) MCG/ACT inhaler Commonly known as: VENTOLIN HFA Inhale 2 puffs into the lungs every 6 (six) hours as needed for wheezing. For wheezing   amLODipine 10 MG tablet Commonly known as: NORVASC Take 1 tablet (10 mg total) by mouth daily.   aspirin EC 81 MG tablet Take 81 mg by mouth daily.   atorvastatin 40 MG tablet Commonly known as: LIPITOR Take 40 mg by mouth daily.   carbamazepine 200 MG tablet Commonly known as: TEGRETOL  Take 200 mg by mouth 2 (two) times daily.   carvedilol 12.5 MG tablet Commonly known as: COREG Take 12.5 mg by mouth 2 (two) times daily.   ciprofloxacin 500 MG tablet Commonly known as: Cipro Take 1 tablet (500 mg total) by mouth 2 (two) times daily for 5 days.   clopidogrel 75 MG tablet Commonly known as: PLAVIX Take 75 mg by mouth daily.   nitroGLYCERIN 0.4 MG SL tablet Commonly known as: NITROSTAT Place 0.4 mg under the tongue every 5 (five) minutes as  needed for chest pain.   ofloxacin 0.3 % ophthalmic solution Commonly known as: OCUFLOX Place 1 drop into the left eye 3 (three) times daily.   oxyCODONE 5 MG immediate release tablet Commonly known as: Oxy IR/ROXICODONE Take 1 tablet (5 mg total) by mouth every 6 (six) hours as needed for up to 3 days for moderate pain.   potassium chloride SA 20 MEQ tablet Commonly known as: KLOR-CON M Take 1 tablet (20 mEq total) by mouth daily.   spironolactone 25 MG tablet Commonly known as: ALDACTONE Take 25 mg by mouth daily.        Discharge Exam: Filed Weights   05/31/23 2230 06/01/23 0411  Weight: 45.4 kg 50.7 kg        General:  AAO x 3,  cooperative, no distress;   HEENT:  Normocephalic, PERRL, otherwise with in Normal limits   Neuro:  CNII-XII intact. , normal motor and sensation, reflexes intact   Lungs:   Clear to auscultation BL, Respirations unlabored,  No wheezes / crackles  Cardio:    S1/S2, RRR, No murmure, No Rubs or Gallops   Abdomen:  Soft, non-tender, bowel sounds active all four quadrants, no guarding or peritoneal signs.  Muscular  skeletal:  Hip pain, limited range of motion due to pain and discomfort with ambulation Limited exam -global generalized weaknesses - in bed, able to move all 4 extremities,   2+ pulses,  symmetric, No pitting edema  Skin:  Dry, warm to touch, negative for any Rashes,  Wounds: Please see nursing documentation  Pressure Injury 02/02/23 Buttocks Left Stage 1 -  Intact skin with non-blanchable redness of a localized area usually over a bony prominence. (Active)  02/02/23 2219  Location: Buttocks  Location Orientation: Left  Staging: Stage 1 -  Intact skin with non-blanchable redness of a localized area usually over a bony prominence.  Wound Description (Comments):   Present on Admission: No          Condition at discharge: fair  The results of significant diagnostics from this hospitalization (including imaging,  microbiology, ancillary and laboratory) are listed below for reference.   Imaging Studies: CT Hip Right Wo Contrast  Result Date: 06/01/2023 CLINICAL DATA:  Hip pain, stress fracture suspected. Negative x-ray. EXAM: CT OF THE RIGHT HIP WITHOUT CONTRAST TECHNIQUE: Multidetector CT imaging of the right hip was performed according to the standard protocol. Multiplanar CT image reconstructions were also generated. RADIATION DOSE REDUCTION: This exam was performed according to the departmental dose-optimization program which includes automated exposure control, adjustment of the mA and/or kV according to patient size and/or use of iterative reconstruction technique. COMPARISON:  None Available. FINDINGS: Bones/Joint/Cartilage The bones are osteopenic. Right hip screw and intramedullary nail are present. There is no evidence for hardware loosening. There is buckling at the subcapital level of the right femoral neck which is new from 04/05/2013. This is subtle and nondisplaced favored as acute or subacute fracture. Ligaments Suboptimally assessed  by CT. Muscles and Tendons Within normal limits. Soft tissues No focal hematoma or fluid collection. Vascular calcifications are present. IMPRESSION: 1. Acute or subacute nondisplaced fracture of the subcapital right femoral neck fracture. 2. Right hip screw and intramedullary nail are in place. No evidence for hardware loosening. Electronically Signed   By: Darliss Cheney M.D.   On: 06/01/2023 02:51   DG HIP UNILAT WITH PELVIS 2-3 VIEWS RIGHT  Result Date: 06/01/2023 CLINICAL DATA:  Right hip pain EXAM: DG HIP (WITH OR WITHOUT PELVIS) 2-3V RIGHT COMPARISON:  Radiographs 04/14/2023 FINDINGS: IM rod and screw fixation across the right femur intertrochanteric fracture. Similar mild displacement of the lesser trochanter. No radiographic evidence of loosening. There is new irregularity about the femoral head neck junction since 04/14/2023. IMPRESSION: 1. New irregularity about  the femoral head neck junction which may be projectional artifact or due to nondisplaced fracture. Consider CT for further evaluation. 2. IM rod and screw fixation across a healing intertrochanteric fracture of the right femur. Electronically Signed   By: Minerva Fester M.D.   On: 06/01/2023 00:45   DG Chest Port 1 View  Result Date: 05/31/2023 CLINICAL DATA:  Questionable sepsis-evaluate for abnormality EXAM: PORTABLE CHEST 1 VIEW COMPARISON:  Radiographs 01/27/2023 FINDINGS: Stable cardiomediastinal silhouette. Aortic atherosclerotic calcification. No focal consolidation, pleural effusion, or pneumothorax. No displaced rib fractures. IMPRESSION: No active disease. Electronically Signed   By: Minerva Fester M.D.   On: 05/31/2023 23:08    Microbiology: Results for orders placed or performed during the hospital encounter of 05/31/23  Blood Culture (routine x 2)     Status: Abnormal   Collection Time: 05/31/23 10:56 PM   Specimen: Left Antecubital; Blood  Result Value Ref Range Status   Specimen Description   Final    LEFT ANTECUBITAL Performed at [facility], [address], [place], [place] [address]    Special Requests   Final    BOTTLES DRAWN AEROBIC AND ANAEROBIC Blood Culture adequate volume Performed at [facility], [address], [place], [place] [address]    Culture  Setup Time   Final    GRAM NEGATIVE RODS BOTTLES DRAWN AEROBIC ONLY CRITICAL VALUE NOTED.  VALUE IS CONSISTENT WITH PREVIOUSLY REPORTED AND CALLED VALUE.    Culture (A)  Final    ESCHERICHIA COLI SUSCEPTIBILITIES PERFORMED ON PREVIOUS CULTURE WITHIN THE LAST 5 DAYS. Performed at [facility], [address] [place], [place] [address]    Report Status 06/03/2023 FINAL  Final  Blood Culture (routine x 2)     Status: Abnormal   Collection Time: 05/31/23 10:56 PM   Specimen: Right Antecubital; Blood  Result Value Ref Range Status   Specimen Description   Final    RIGHT ANTECUBITAL Performed at [facility], [address], [place], [place] [address]    Special Requests   Final    BOTTLES DRAWN AEROBIC AND ANAEROBIC Blood Culture adequate volume Performed at [facility], [address] [place], [place] [address]    Culture  Setup Time   Final    GRAM NEGATIVE RODS AEROBIC BOTTLE ONLY Gram Stain Report Called to,Read Back By and Verified With: INGOLD T AT 1410 ON [id number] BY S DALTON CRITICAL RESULT CALLED TO, READ BACK BY AND VERIFIED WITH: RN MICHELLE CATES LAND ON 06/01/23 @ 1818 BY DRT  Performed at [facility], [address] [name], [place] [address]    Culture ESCHERICHIA COLI (A)  Final   Report Status 06/03/2023  FINAL  Final   Organism ID, Bacteria ESCHERICHIA COLI  Final      Susceptibility   Escherichia coli - MIC*    AMPICILLIN >=32 RESISTANT Resistant     CEFEPIME <=0.12 SENSITIVE Sensitive     CEFTAZIDIME <=1 SENSITIVE Sensitive     CEFTRIAXONE <=0.25 SENSITIVE Sensitive     CIPROFLOXACIN <=0.25 SENSITIVE Sensitive     GENTAMICIN <=1 SENSITIVE Sensitive     IMIPENEM <=0.25 SENSITIVE Sensitive     TRIMETH/SULFA <=20 SENSITIVE Sensitive     AMPICILLIN/SULBACTAM 4 SENSITIVE Sensitive     PIP/TAZO <=4 SENSITIVE Sensitive     * ESCHERICHIA COLI  Blood Culture ID Panel (Reflexed)     Status: Abnormal   Collection Time: 05/31/23 10:56 PM  Result Value Ref Range Status   Enterococcus faecalis NOT DETECTED NOT DETECTED Final   Enterococcus Faecium NOT DETECTED NOT DETECTED Final   Listeria monocytogenes NOT DETECTED NOT DETECTED Final   Staphylococcus species NOT DETECTED NOT DETECTED Final   Staphylococcus aureus (BCID) NOT DETECTED NOT DETECTED Final   Staphylococcus epidermidis NOT DETECTED NOT DETECTED Final   Staphylococcus lugdunensis NOT DETECTED NOT DETECTED Final   Streptococcus species NOT DETECTED NOT DETECTED Final   Streptococcus agalactiae NOT DETECTED NOT DETECTED Final   Streptococcus pneumoniae NOT DETECTED NOT DETECTED Final   Streptococcus  pyogenes NOT DETECTED NOT DETECTED Final   A.calcoaceticus-baumannii NOT DETECTED NOT DETECTED Final   Bacteroides fragilis NOT DETECTED NOT DETECTED Final   Enterobacterales DETECTED (A) NOT DETECTED Final    Comment: Enterobacterales represent a large order of gram negative bacteria, not a single organism. CRITICAL RESULT CALLED TO, READ BACK BY AND VERIFIED WITH: RN MICHELLE CATES LAND ON 06/01/23 @ 1818 BY DRT     Enterobacter cloacae complex NOT DETECTED NOT DETECTED Final   Escherichia coli DETECTED (A) NOT DETECTED Final    Comment: CRITICAL RESULT CALLED TO, READ BACK BY AND VERIFIED WITH: RN MICHELLE CATES LAND ON 06/01/23 @ 1818 BY DRT     Klebsiella aerogenes NOT DETECTED NOT DETECTED Final   Klebsiella oxytoca NOT DETECTED NOT DETECTED Final   Klebsiella pneumoniae NOT DETECTED NOT DETECTED Final   Proteus species NOT DETECTED NOT DETECTED Final   Salmonella species NOT DETECTED NOT DETECTED Final   Serratia marcescens NOT DETECTED NOT DETECTED Final   Haemophilus influenzae NOT DETECTED NOT DETECTED Final   Neisseria meningitidis NOT DETECTED NOT DETECTED Final   Pseudomonas aeruginosa NOT DETECTED NOT DETECTED Final   Stenotrophomonas maltophilia NOT DETECTED NOT DETECTED Final   Candida albicans NOT DETECTED NOT DETECTED Final   Candida auris NOT DETECTED NOT DETECTED Final   Candida glabrata NOT DETECTED NOT DETECTED Final   Candida krusei NOT DETECTED NOT DETECTED Final   Candida parapsilosis NOT DETECTED NOT DETECTED Final   Candida tropicalis NOT DETECTED NOT DETECTED Final   Cryptococcus neoformans/gattii NOT DETECTED NOT DETECTED Final   CTX-M ESBL NOT DETECTED NOT DETECTED Final   Carbapenem resistance IMP NOT DETECTED NOT DETECTED Final   Carbapenem resistance KPC NOT DETECTED NOT DETECTED Final   Carbapenem resistance NDM NOT DETECTED NOT DETECTED Final   Carbapenem resist OXA 48 LIKE NOT DETECTED NOT DETECTED Final   Carbapenem resistance VIM NOT DETECTED NOT  DETECTED Final    Comment: Performed at [facility], [address], [place], [place] [address]  Urine Culture     Status: Abnormal   Collection Time: 06/01/23 12:30 AM   Specimen: Urine, Random  Result  Value Ref Range Status   Specimen Description   Final    URINE, RANDOM Performed at [facility], [address] [place], [place] [address]    Special Requests   Final    NONE Reflexed from [id number] Performed at [facility], [address], [place], [place] [address]    Culture >=100,000 COLONIES/mL ESCHERICHIA COLI (A)  Final   Report Status 06/03/2023 FINAL  Final   Organism ID, Bacteria ESCHERICHIA COLI (A)  Final      Susceptibility   Escherichia coli - MIC*    AMPICILLIN >=32 RESISTANT Resistant     CEFAZOLIN <=4 SENSITIVE Sensitive     CEFEPIME <=0.12 SENSITIVE Sensitive     CEFTRIAXONE <=0.25 SENSITIVE Sensitive     CIPROFLOXACIN <=0.25 SENSITIVE Sensitive     GENTAMICIN <=1 SENSITIVE Sensitive     IMIPENEM <=0.25 SENSITIVE Sensitive     NITROFURANTOIN <=16 SENSITIVE Sensitive     TRIMETH/SULFA <=20 SENSITIVE Sensitive     AMPICILLIN/SULBACTAM 8 SENSITIVE Sensitive     PIP/TAZO <=4 SENSITIVE Sensitive     * >=100,000 COLONIES/mL ESCHERICHIA COLI  SARS Coronavirus 2 by RT PCR (hospital order, performed in [facility] hospital lab) *cepheid single result test* Anterior Nasal Swab     Status: None   Collection Time: 06/01/23 12:38 AM   Specimen: Anterior Nasal Swab  Result Value Ref Range Status   SARS Coronavirus 2 by RT PCR NEGATIVE NEGATIVE Final    Comment: (NOTE) SARS-CoV-2 target nucleic acids are NOT DETECTED.  The SARS-CoV-2 RNA is generally detectable in upper and lower respiratory specimens during the acute phase of infection. The lowest concentration of SARS-CoV-2 viral copies this assay can detect is 250 copies / mL. A negative result does not preclude SARS-CoV-2 infection and should not be used as the sole basis for treatment or other patient  management decisions.  A negative result may occur with improper specimen collection / handling, submission of specimen other than nasopharyngeal swab, presence of viral mutation(s) within the areas targeted by this assay, and inadequate number of viral copies (<250 copies / mL). A negative result must be combined with clinical observations, patient history, and epidemiological information.  Fact Sheet for Patients:   RoadLapTop.co.za  Fact Sheet for Healthcare Providers: http://kim-miller.com/  This test is not yet approved or  cleared by the [place] FDA and has been authorized for detection and/or diagnosis of SARS-CoV-2 by FDA under an Emergency Use Authorization (EUA).  This EUA will remain in effect (meaning this test can be used) for the duration of the COVID-19 declaration under Section 564(b)(1) of the Act, 21 U.S.C. section 360bbb-3(b)(1), unless the authorization is terminated or revoked sooner.  Performed at [facility], [address] [place], [place] [address]     Labs: CBC: Recent Labs  Lab 05/31/23 2237 06/01/23 0614  WBC 8.5 8.4  NEUTROABS 6.4  --   HGB 9.4* 9.0*  HCT 27.4* 26.3*  MCV 83.0 83.0  PLT 248 230   Basic Metabolic Panel: Recent Labs  Lab 05/31/23 2237 06/01/23 0614  NA 134* 134*  K 2.7* 3.1*  CL 97* 99  CO2 27 28  GLUCOSE 114* 104*  BUN 12 11  CREATININE 0.85 0.82  CALCIUM 8.7* 8.8*  MG  --  1.8  PHOS  --  3.3   Liver Function Tests: Recent Labs  Lab 05/31/23 2237 06/01/23 0614  AST 17 15  ALT 10 10  ALKPHOS 62 56  BILITOT 0.6 0.4  PROT  6.6 6.1*  ALBUMIN 3.0* 2.7*   CBG: No results for input(s): "GLUCAP" in the last 168 hours.  Discharge time spent: greater than 30 minutes.  Signed: Kendell Bane, MD [facility] 06/03/2023

## 2023-06-04 ENCOUNTER — Telehealth: Payer: Self-pay

## 2023-06-04 NOTE — Transitions of Care (Post Inpatient/ED Visit) (Signed)
   06/04/2023  Name: [name] MRN: [id number] DOB: 1956-10-25  Today's TOC FU Call Status: Today's TOC FU Call Status:: Unsuccessul Call (1st Attempt) Unsuccessful Call (1st Attempt) Date: 06/04/23  Attempted to reach the patient regarding the most recent Inpatient/ED visit.  Follow Up Plan: Additional outreach attempts will be made to reach the patient to complete the Transitions of Care (Post Inpatient/ED visit) call.   Signature Karena Addison, LPN [facility] Nurse Health Advisor Direct Dial [phone]

## 2023-06-08 NOTE — Transitions of Care (Post Inpatient/ED Visit) (Unsigned)
   06/08/2023  Name: [name] MRN: [id number] DOB: July 05, 1956  Today's TOC FU Call Status: Today's TOC FU Call Status:: Unsuccessful Call (2nd Attempt) Unsuccessful Call (1st Attempt) Date: 06/04/23 Unsuccessful Call (2nd Attempt) Date: 06/08/23  Attempted to reach the patient regarding the most recent Inpatient/ED visit.  Follow Up Plan: Additional outreach attempts will be made to reach the patient to complete the Transitions of Care (Post Inpatient/ED visit) call.   Signature Karena Addison, LPN [facility] Nurse Health Advisor Direct Dial [phone]

## 2023-06-09 NOTE — Transitions of Care (Post Inpatient/ED Visit) (Signed)
   06/09/2023  Name: [name] MRN: [id number] DOB: 12/26/1955  Today's TOC FU Call Status: Today's TOC FU Call Status:: Unsuccessful Call (3rd Attempt) Unsuccessful Call (1st Attempt) Date: 06/04/23 Unsuccessful Call (2nd Attempt) Date: 06/08/23 Unsuccessful Call (3rd Attempt) Date: 06/09/23  Attempted to reach the patient regarding the most recent Inpatient/ED visit.  Follow Up Plan: No further outreach attempts will be made at this time. We have been unable to contact the patient.  Signature Karena Addison, LPN [facility] Nurse Health Advisor Direct Dial [phone]

## 2023-06-10 ENCOUNTER — Other Ambulatory Visit (INDEPENDENT_AMBULATORY_CARE_PROVIDER_SITE_OTHER): Payer: Medicare Other

## 2023-06-10 ENCOUNTER — Encounter: Payer: Self-pay | Admitting: Orthopedic Surgery

## 2023-06-10 ENCOUNTER — Ambulatory Visit (INDEPENDENT_AMBULATORY_CARE_PROVIDER_SITE_OTHER): Payer: Medicare Other | Admitting: Orthopedic Surgery

## 2023-06-10 VITALS — BP 152/71 | HR 78 | Ht 64.0 in | Wt 111.0 lb

## 2023-06-10 DIAGNOSIS — S72141D Displaced intertrochanteric fracture of right femur, subsequent encounter for closed fracture with routine healing: Secondary | ICD-10-CM | POA: Diagnosis not present

## 2023-06-10 MED ORDER — OXYCODONE HCL 5 MG PO TABS: 5.0000 mg | ORAL_TABLET | Freq: Four times a day (QID) | ORAL | 0 refills | Status: AC | PRN

## 2023-06-10 NOTE — Progress Notes (Signed)
Orthopaedic Postop Note  Assessment: [name] is a 67 y.o. female s/p cephalomedullary nail for Right intertrochanteric femur fracture; please follow with minimally displaced fracture of the femoral neck  DOS: 01/29/23  Plan: [name] sustained a recent fall, and has a nondisplaced fracture of the femoral neck.  Hardware remains intact.  Continue with weightbearing as tolerated.  Range of motion as tolerated.  Recommend using a walker to assist with ambulation.  I would like to see her back in 6 weeks for repeat evaluation.  Limited supply of oxycodone has been provided.   Follow-up: Return in about 6 weeks (around 07/22/2023). XR at next visit: AP pelvis and Right femur  Subjective:  Chief Complaint  Patient presents with   Post-op Follow-up    Right hip     History of Present Illness: [name] is a 67 y.o. female who presents following the above stated procedure.  She sustained a fall recently.  She was admitted to the hospital a week ago.  She is found to have a minimally displaced fracture around the hardware in the femoral neck.  She has been discharged, and is working at home with home health physical therapy.  She is using a walker to assist with ambulation.   Review of Systems: No fevers or chills No numbness or tingling No Chest Pain No shortness of breath   Objective: BP (!) 152/71   Pulse 78   Ht 5\' 4"  (1.626 m)   Wt 111 lb (50.3 kg)   BMI 19.05 kg/m   Physical Exam:  Alert and oriented.  No acute distress.  Seated in wheelchair.  Surgical incisions have healed.  No surrounding erythema or drainage.  Able to maintain a straight leg raise.  Active motion intact in the TA/EHL.  Toes are warm and well-perfused.  She does note some pain with attempted straight leg raise.  No pain with gentle range of motion.  Mild discomfort with axial loading.   IMAGING: I personally ordered and reviewed the following images:  AP pelvis and right femur x-rays were  obtained in clinic today.  Hardware remains intact.  Screws not coming out of the femoral head.  The superior aspect of the femoral neck there is a cortical defect.  No additional injuries.  No bony lesions.  Impression: Stable right intertrochanteric and nondisplaced femoral neck fractures without hardware failure.  Oliver Barre, MD 06/10/2023 2:48 PM

## 2023-06-10 NOTE — Patient Instructions (Addendum)
Right hip pain following a cephalomedullary nail for an intertrochanteric femur fracture with recent non displaced fracture of the femoral neck  Continue weightbearing as tolerated.  Use walker to assist with ambulation.  No restrictions on range of motion  Follow-up in 6 weeks.

## 2023-06-16 ENCOUNTER — Telehealth: Payer: Self-pay | Admitting: Family Medicine

## 2023-06-16 NOTE — Telephone Encounter (Signed)
Called patient to schedule for [facility] clinic on August 8th. Unable to reach on cell phone or home phone. Voicemail not set up on either. Reached patient's sister who states patient would be able to make it to appointment next Thursday.

## 2023-06-25 ENCOUNTER — Ambulatory Visit: Payer: Medicare Other | Admitting: Family Medicine

## 2023-06-25 NOTE — Progress Notes (Deleted)
    SUBJECTIVE:   CHIEF COMPLAINT / HPI: anemia follow-up  Hgb 7.4 last visit. Diet counseling with Dr. Brown. Here for further lab investigation. No new interim events. [name] is doing well. Has been taking [employer] multivitamin.   PERTINENT  PMH / PSH: Anemia  OBJECTIVE:   BP (!) 111/62 Comment: on exam table  Pulse 98   Ht 3' 4.39" (1.026 m)   Wt 32 lb 2 oz (14.6 kg)   SpO2 99%   BMI 13.84 kg/m   General: NAD  HEENT: pale sclerae Cardiovascular: RRR, no murmurs, no peripheral edema Respiratory: normal WOB on RA, CTAB, no wheezes, ronchi or rales Abdomen: soft, NTTP, no rebound or guarding Extremities: Moving all 4 extremities equally   ASSESSMENT/PLAN:   Anemia, unspecified type Assessment & Plan: VSS, well appearing. Likely iron deficiency anemia. Will verify with ferritin and full CBC. Lead screening as well. If iron stores low will add iron supplementation. If not will schedule for lab visit for hgb electrophoresis and G6PD testing.  Orders: -     Ferritin -     CBC with Differential/Platelet  Need for lead screening -     Lead, Blood (Pediatric age 15 yrs or younger)  Elevated blood pressure reading Assessment & Plan: 96% percentile for age x2 in clinic. Will repeat at follow-up. Consider full primary versus secondary HTN work-up if still elevated at follow-up visit in 1 month.    Return in about 1 month (around 04/19/2023).   , MD [facility]   

## 2023-06-29 ENCOUNTER — Telehealth: Payer: Self-pay | Admitting: *Deleted

## 2023-06-29 DIAGNOSIS — Z5982 Transportation insecurity: Secondary | ICD-10-CM

## 2023-06-29 NOTE — Telephone Encounter (Signed)
Koleen Nimrod would like for provider to put in a referral for social worker to get her help with transportation. Jone Baseman, CMA

## 2023-07-01 ENCOUNTER — Telehealth: Payer: Self-pay | Admitting: Hematology and Oncology

## 2023-07-01 NOTE — Telephone Encounter (Signed)
Left patient a message regarding scheduled appointment times/dates

## 2023-07-07 ENCOUNTER — Other Ambulatory Visit: Payer: Medicare Other

## 2023-07-07 NOTE — Patient Instructions (Signed)
  Medicaid Managed Care   Unsuccessful Outreach Note  07/07/2023 Name: [name] MRN: [id number] DOB: 1955-11-23  Referred by: Celine Mans, MD Reason for referral : High Risk Managed Medicaid (MM Social work unsuccessful telephone outreach )   An unsuccessful telephone outreach was attempted today. The patient was referred to the case management team for assistance with care management and care coordination.   Follow Up Plan: A HIPAA compliant phone message was left for the patient providing contact information and requesting a return call.   Abelino Derrick, MHA [facility]  Managed [facility] Social Worker [phone]

## 2023-07-07 NOTE — Patient Outreach (Signed)
  Medicaid Managed Care   Unsuccessful Outreach Note  07/07/2023 Name: [name] MRN: [id number] DOB: 1955-11-23  Referred by: Celine Mans, MD Reason for referral : High Risk Managed Medicaid (MM Social work unsuccessful telephone outreach )   An unsuccessful telephone outreach was attempted today. The patient was referred to the case management team for assistance with care management and care coordination.   Follow Up Plan: A HIPAA compliant phone message was left for the patient providing contact information and requesting a return call.   Abelino Derrick, MHA [facility]  Managed [facility] Social Worker [phone]

## 2023-07-07 NOTE — Progress Notes (Deleted)
    SUBJECTIVE:   CHIEF COMPLAINT / HPI:   ***  PERTINENT  PMH / PSH: HTN, CAD, COPD, Femur fracture, Seizures  OBJECTIVE:   There were no vitals taken for this visit.  ***  ASSESSMENT/PLAN:   There are no diagnoses linked to this encounter. No follow-ups on file.  Celine Mans, MD [facility]

## 2023-07-07 NOTE — Telephone Encounter (Signed)
[name] calls nurse line in regards to social work referral.   She reports they have still not heard anything in regards to transportation.   She asks I cancel her apt for tomorrow as she has no way to get here.   Will forward to [facility] for guidance.

## 2023-07-08 ENCOUNTER — Ambulatory Visit: Payer: Medicare Other | Admitting: Family Medicine

## 2023-07-15 ENCOUNTER — Encounter: Payer: Medicare Other | Admitting: Orthopedic Surgery

## 2023-07-22 ENCOUNTER — Telehealth: Payer: Self-pay

## 2023-07-22 ENCOUNTER — Ambulatory Visit: Payer: Medicare Other | Admitting: Orthopedic Surgery

## 2023-07-22 NOTE — Telephone Encounter (Signed)
..   Medicaid Managed Care   Unsuccessful Outreach Note  07/22/2023 Name: [name] MRN: [id number] DOB: Sep 02, 1956  Referred by: Celine Mans, MD Reason for referral : Appointment (Called the patient today to get her missed phone appointment with the MM BSW rescheduled.She did not answer.)   A second unsuccessful telephone outreach was attempted today. The patient was referred to the case management team for assistance with care management and care coordination.   Follow Up Plan: The care management team will reach out to the patient again over the next 7 days.   Weston Settle Care Guide  [facility] Managed  Care Guide [facility]  [phone]

## 2023-07-30 ENCOUNTER — Encounter: Payer: Self-pay | Admitting: Podiatry

## 2023-07-30 ENCOUNTER — Ambulatory Visit (INDEPENDENT_AMBULATORY_CARE_PROVIDER_SITE_OTHER): Payer: Medicare Other | Admitting: Podiatry

## 2023-07-30 DIAGNOSIS — B351 Tinea unguium: Secondary | ICD-10-CM | POA: Diagnosis not present

## 2023-07-30 DIAGNOSIS — M79675 Pain in left toe(s): Secondary | ICD-10-CM

## 2023-07-30 DIAGNOSIS — M79674 Pain in right toe(s): Secondary | ICD-10-CM | POA: Diagnosis not present

## 2023-07-30 NOTE — Progress Notes (Signed)

## 2023-07-31 ENCOUNTER — Other Ambulatory Visit: Payer: Medicare Other

## 2023-07-31 NOTE — Patient Instructions (Signed)
Medicaid Managed Care   Unsuccessful Outreach Note  07/31/2023 Name: [name] MRN: [id number] DOB: [birth year]  Referred by: Celine Mans, MD Reason for referral : High Risk Managed Medicaid (MM social work unsuccessful telephone outreach)   An unsuccessful telephone outreach was attempted today. The patient was referred to the case management team for assistance with care management and care coordination.   Follow Up Plan: The care management team will reach out to the patient again over the next 7-10 days.  Abelino Derrick, MHA [facility]  Managed [facility] Social Worker [phone]

## 2023-07-31 NOTE — Patient Outreach (Signed)
Medicaid Managed Care   Unsuccessful Outreach Note  07/31/2023 Name: [name] MRN: [id number] DOB: [birth year]  Referred by: Celine Mans, MD Reason for referral : High Risk Managed Medicaid (MM social work unsuccessful telephone outreach)   An unsuccessful telephone outreach was attempted today. The patient was referred to the case management team for assistance with care management and care coordination.   Follow Up Plan: The care management team will reach out to the patient again over the next 7-10 days.  Abelino Derrick, MHA [facility]  Managed [facility] Social Worker [phone]

## 2023-08-04 ENCOUNTER — Inpatient Hospital Stay: Payer: Medicare Other | Attending: Hematology and Oncology | Admitting: Hematology and Oncology

## 2023-08-04 NOTE — Assessment & Plan Note (Deleted)
08/13/2017: Mammogram and ultrasound:Large left breast mass involving all quadrants measuring 7.5 cm with enlarged axillary lymph node, biopsy revealed high-grade  DCIS ER 40% PR 20%,, lymph node biopsy negative, Tis N0 stage 0   09/08/2017: Left mastectomy: IDC grade 2, 0.6 cm, extensive DCIS, high-grade with necrosis, margins negative, 1/8 lymph node with micrometastatic disease, T1BN1 mic stage I a, ER 100%, PR 100%, Ki-67 70%, HER-2 negative ratio 1.23. Did not receive radiation because of comorbidities   Current treatment: antiestrogen therapy with anastrozole5 years started 09/28/2017 Patient history of  Stroke and coronary artery disease  She has borderline performance status.   Anastrozole toxicities: Denies any hot flashes or myalgias.    CT CAP 01/20/2020: No evidence of metastatic disease.   Breast cancer surveillance: 1.  Breast exam 08/04/2023: Benign 2.  Right breast mammogram  01/30/2020: These will need to be arranged again  Hospitalization 05/31/2023-06/03/2023: Acute right femoral neck fracture status post surgery   Return to clinic in 1 year for follow-up

## 2023-10-12 ENCOUNTER — Ambulatory Visit: Payer: Medicare Other

## 2023-11-30 ENCOUNTER — Encounter: Payer: Self-pay | Admitting: Podiatry

## 2023-11-30 ENCOUNTER — Ambulatory Visit (INDEPENDENT_AMBULATORY_CARE_PROVIDER_SITE_OTHER): Payer: 59 | Admitting: Podiatry

## 2023-11-30 ENCOUNTER — Ambulatory Visit (INDEPENDENT_AMBULATORY_CARE_PROVIDER_SITE_OTHER): Payer: 59

## 2023-11-30 VITALS — Wt 119.0 lb

## 2023-11-30 DIAGNOSIS — Z0000 Encounter for general adult medical examination without abnormal findings: Secondary | ICD-10-CM | POA: Diagnosis not present

## 2023-11-30 DIAGNOSIS — B351 Tinea unguium: Secondary | ICD-10-CM

## 2023-11-30 DIAGNOSIS — M79675 Pain in left toe(s): Secondary | ICD-10-CM | POA: Diagnosis not present

## 2023-11-30 DIAGNOSIS — M79674 Pain in right toe(s): Secondary | ICD-10-CM | POA: Diagnosis not present

## 2023-11-30 NOTE — Progress Notes (Addendum)
 Subjective:   [name] is a 68 y.o. female who presents for an Initial Medicare Annual Wellness Visit.  Visit Complete: Virtual I connected with  [name] on 11/30/23 by a audio enabled telemedicine application and verified that I am speaking with the correct person using two identifiers.  Patient Location: Home  Provider Location: Home Office  I discussed the limitations of evaluation and management by telemedicine. The patient expressed understanding and agreed to proceed.  Vital Signs: Because this visit was a virtual/telehealth visit, some criteria may be missing or patient reported. Any vitals not documented were not able to be obtained and vitals that have been documented are patient reported.  This patient declined Interactive audio and acupuncturist. Therefore the visit was completed with audio only.  Cardiac Risk Factors include: advanced age (>35men, >44 women);dyslipidemia;family history of premature cardiovascular disease;hypertension;sedentary lifestyle;smoking/ tobacco exposure     Objective:    Today's Vitals   11/30/23 1345  Weight: 119 lb (54 kg)  PainSc: 0-No pain   Body mass index is 20.43 kg/m.     11/30/2023    1:49 PM 06/01/2023    4:00 AM 05/31/2023   10:31 PM 02/11/2023    9:25 AM 01/29/2023    7:01 AM 01/27/2023    6:40 AM 01/24/2023   10:24 PM  Advanced Directives  Does Patient Have a Medical Advance Directive? No No No No No No No  Would patient like information on creating a medical advance directive? No - Patient declined No - Patient declined  No - Patient declined No - Patient declined No - Patient declined No - Patient declined    Current Medications (verified) Outpatient Encounter Medications as of 11/30/2023  Medication Sig   albuterol  (PROVENTIL  HFA;VENTOLIN  HFA) 108 (90 BASE) MCG/ACT inhaler Inhale 2 puffs into the lungs every 6 (six) hours as needed for wheezing. For wheezing   amLODipine  (NORVASC ) 10 MG tablet Take 1  tablet (10 mg total) by mouth daily.   aspirin  81 MG EC tablet Take 81 mg by mouth daily.     atorvastatin  (LIPITOR) 40 MG tablet Take 40 mg by mouth daily.   carbamazepine  (TEGRETOL ) 200 MG tablet Take 200 mg by mouth 2 (two) times daily.   carvedilol  (COREG ) 12.5 MG tablet Take 12.5 mg by mouth 2 (two) times daily.   clopidogrel  (PLAVIX ) 75 MG tablet Take 75 mg by mouth daily.    nitroGLYCERIN  (NITROSTAT ) 0.4 MG SL tablet Place 0.4 mg under the tongue every 5 (five) minutes as needed for chest pain.    ofloxacin  (OCUFLOX ) 0.3 % ophthalmic solution Place 1 drop into the left eye 3 (three) times daily.   potassium chloride  SA (K-DUR,KLOR-CON ) 20 MEQ tablet Take 1 tablet (20 mEq total) by mouth daily.   spironolactone  (ALDACTONE ) 25 MG tablet Take 25 mg by mouth daily.   Facility-Administered Encounter Medications as of 11/30/2023  Medication   mitoMYcin  (MUTAMYCIN ) Injection Use in OR only (0.4 mg/ml)    Allergies (verified) Bee venom and Other   History: Past Medical History:  Diagnosis Date   Adjustment disorder with depressed mood 01/11/2009   Arthritis    Asthma    Breast cancer (HCC)    left breast   CAD 07/10/2008   Chronic back pain    COCAINE DEPENDENCY NOS 01/14/2007   stopped Cocaine 15 years    CONVULSIONS, SEIZURES, NOS 01/14/2007   last seizure May, 2016   COPD (chronic obstructive pulmonary disease) (HCC)  Coronary artery disease 01/14/2012   CVA 01/14/2007   Difficulty seeing    patient has glaucoma   Family history of anesthesia complication 1984   Pts sister (3 years old) died after having anesthesia - induced a heart attcks   Glaucoma of both eyes    Headache(784.0)    HEPATITIS C 02/26/2009   Hepatitis C    Hyperlipemia    HYPERTENSION, BENIGN SYSTEMIC 01/14/2007   Memory loss    d/t CVA   MI (myocardial infarction) (HCC) 12/20/2007   had stent placed   Pneumonia    hx of   PTSD (post-traumatic stress disorder)    Right sided weakness    d/t  stroke   Sciatica    Stroke [facility])    right side weakness   TOBACCO DEPENDENCE 01/14/2007   Past Surgical History:  Procedure Laterality Date   BREAST LUMPECTOMY Left 09/18/2017   Procedure: EVACUATION HEMATOMA  LEFT BREAST;  Surgeon: Vernetta Berg, MD;  Location: [facility] OR;  Service: General;  Laterality: Left;   CATARACT EXTRACTION W/PHACO Right 07/11/2015   Procedure: CATARACT EXTRACTION PHACO AND INTRAOCULAR LENS PLACEMENT (IOC) RIGHT,INTERNAL PLEB REVISION;  Surgeon: Gaither Quan, MD;  Location[facility] OR;  Service: Ophthalmology;  Laterality: Right;   CESAREAN SECTION     x 2   COLONOSCOPY W/ POLYPECTOMY     CORONARY ANGIOPLASTY WITH STENT PLACEMENT  2009   1 stent   EYE SURGERY     INTRAMEDULLARY (IM) NAIL INTERTROCHANTERIC Right 01/29/2023   Procedure: INTRAMEDULLARY (IM) NAIL INTERTROCHANTERIC;  Surgeon: Onesimo Oneil LABOR, MD;  Location: [facility] ORS;  Service: Orthopedics;  Laterality: Right;   MASTECTOMY W/ SENTINEL NODE BIOPSY Left 09/08/2017   Procedure: LEFT MASTECTOMY WITH SENTINEL LYMPH NODE BIOPSY;  Surgeon: Vernetta Berg, MD;  Location: [facility] OR;  Service: General;  Laterality: Left;   MITOMYCIN  C APPLICATION Right 09/07/2013   Procedure: MITOMYCIN  C APPLICATION RIGHT EYE;  Surgeon: Gaither Quan, MD;  Location: [facility] OR;  Service: Ophthalmology;  Laterality: Right;   TONSILLECTOMY     TRABECULECTOMY Left 06/29/2013   Procedure: TRABECULECTOMY WITH MYTOMICIN C LEFT EYE;  Surgeon: Gaither Quan, MD;  Location: [facility] OR;  Service: Ophthalmology;  Laterality: Left;   TRABECULECTOMY Right 09/07/2013   Procedure: TRABECULECTOMY RIGHT EYE;  Surgeon: Gaither Quan, MD;  Location: [facility] OR;  Service: Ophthalmology;  Laterality: Right;   Family History  Problem Relation Age of Onset   Stroke Mother    Colon cancer Paternal Uncle    Heart disease Sister    Colon cancer Paternal Aunt    Social History   Socioeconomic History   Marital status: Single    Spouse name: Not on file   Number of  children: 2   Years of education: Not on file   Highest education level: Not on file  Occupational History   Not on file  Tobacco Use   Smoking status: Some Days    Current packs/day: 0.25    Average packs/day: 0.3 packs/day for 47.0 years (11.8 ttl pk-yrs)    Types: Cigarettes   Smokeless tobacco: Never  Vaping Use   Vaping status: Never Used  Substance and Sexual Activity   Alcohol use: No    Comment: quit 4 years ago   Drug use: No    Comment: last use of cocaine 20 years ago, drug screen + cocaine   Sexual activity: Never    Birth control/protection: Abstinence, Post-menopausal  Other Topics Concern   Not on file  Social History  Narrative   On disability- due to CVA/ PTSD and MI-- weak on right side from stroke.       Has 2 children.  Living with sister here in [place].       Updated 02/01/20   Patient reports she lives alone.  Has an aide that comes daily to assist with ADL's. Is on the wait list for meals on wheels   Had poor eye sight difficult seeing    Social Drivers of Health   Financial Resource Strain: Low Risk  (11/30/2023)   Overall Financial Resource Strain (CARDIA)    Difficulty of Paying Living Expenses: Not hard at all  Food Insecurity: No Food Insecurity (11/30/2023)   Hunger Vital Sign    Worried About Running Out of Food in the Last Year: Never true    Ran Out of Food in the Last Year: Never true  Transportation Needs: No Transportation Needs (11/30/2023)   PRAPARE - Administrator, Civil Service (Medical): No    Lack of Transportation (Non-Medical): No  Physical Activity: Inactive (11/30/2023)   Exercise Vital Sign    Days of Exercise per Week: 0 days    Minutes of Exercise per Session: 0 min  Stress: No Stress Concern Present (11/30/2023)   [employer] of Occupational Health - Occupational Stress Questionnaire    Feeling of Stress : Not at all  Social Connections: Moderately Integrated (11/30/2023)   Social Connection and  Isolation Panel [NHANES]    Frequency of Communication with Friends and Family: More than three times a week    Frequency of Social Gatherings with Friends and Family: More than three times a week    Attends Religious Services: More than 4 times per year    Active Member of [employer] or Organizations: Yes    Attends Engineer, Structural: More than 4 times per year    Marital Status: Never married    Tobacco Counseling Ready to quit: Not Answered Counseling given: Not Answered   Clinical Intake:  Pre-visit preparation completed: Yes  Pain : No/denies pain Pain Score: 0-No pain     BMI - recorded: 20.43 Nutritional Status: BMI of 19-24  Normal Nutritional Risks: None Diabetes: No  How often do you need to have someone help you when you read instructions, pamphlets, or other written materials from your doctor or pharmacy?: 1 - Never What is the last grade level you completed in school?: 10TH  Interpreter Needed?: No  Information entered by :: Jessicamarie Amiri N. Ainslee Sou, LPN.   Activities of Daily Living    11/30/2023    1:52 PM 06/01/2023    4:00 AM  In your present state of health, do you have any difficulty performing the following activities:  Hearing? 0 0  Vision? 1 1   Comment  partially blind on both eyes, no peripheral visison  Difficulty concentrating or making decisions? 1 0  Walking or climbing stairs? 1 1  Dressing or bathing? 1 1  Doing errands, shopping? 1 0  Preparing Food and eating ? Y   Using the Toilet? Y   In the past six months, have you accidently leaked urine? Y   Do you have problems with loss of bowel control? N   Managing your Medications? Y   Managing your Finances? Y   Housekeeping or managing your Housekeeping? Y      Significant value    Patient Care Team: Alba Sharper, MD as PCP - General (Family Medicine) Odean Potts, MD as  Consulting Physician (Hematology and Oncology) Crawford, Morna Pickle, NP as Nurse Practitioner  (Hematology and Oncology) Shannon Agent, MD as Consulting Physician (Radiation Oncology) Vernetta Berg, MD as Consulting Physician (General Surgery)  Indicate any recent Medical Services you may have received from other than [facility] providers in the past year (date may be approximate).     Assessment:   This is a routine wellness examination for [place].  Hearing/Vision screen Hearing Screening - Comments:: Denies hearing difficulties   Vision Screening - Comments:: Patient is partially blind; glaucoma in both eyes.   Goals Addressed   None   Depression Screen    11/30/2023    1:50 PM 02/11/2023    9:25 AM 12/14/2019    2:38 PM 05/31/2018    8:39 AM 10/19/2017   12:54 PM 08/04/2017    3:06 PM 06/19/2016    9:16 AM  PHQ 2/9 Scores  PHQ - 2 Score 0  5 0 0 0 0  PHQ- 9 Score 10  14      Exception Documentation  Patient refusal         Fall Risk    11/30/2023    1:49 PM 02/11/2023    9:26 AM 12/14/2019    2:40 PM 10/19/2017   12:54 PM 08/24/2017    9:02 AM  Fall Risk   Falls in the past year? 1 1 1  Yes Yes  Number falls in past yr: 0 0 0 2 or more 2 or more  Injury with Fall? 1 1 0 No No  Risk Factor Category     High Fall Risk   Risk for fall due to : Impaired vision   History of fall(s)   Follow up Falls evaluation completed;Education provided  Falls evaluation completed;Education provided      MEDICARE RISK AT HOME: Medicare Risk at Home Any stairs in or around the home?: No If so, are there any without handrails?: No Home free of loose throw rugs in walkways, pet beds, electrical cords, etc?: Yes Adequate lighting in your home to reduce risk of falls?: Yes Life alert?: No Use of a cane, walker or w/c?: Yes Grab bars in the bathroom?: No Shower chair or bench in shower?: Yes Elevated toilet seat or a handicapped toilet?: Yes (bedside commode)  TIMED UP AND GO:  Was the test performed? No    Cognitive Function:    11/30/2023    1:53 PM  MMSE - Mini Mental State  Exam  Not completed: Unable to complete        Immunizations Immunization History  Administered Date(s) Administered   Hepatitis A, Adult 07/25/2014   Influenza,inj,Quad PF,6+ Mos 07/25/2014   PNEUMOCOCCAL CONJUGATE-20 02/11/2023   Pneumococcal Polysaccharide-23 12/11/2011   Td 02/20/2005   Tdap 02/11/2023    TDAP status: Up to date  Flu Vaccine status: Declined, Education has been provided regarding the importance of this vaccine but patient still declined. Advised may receive this vaccine at local pharmacy or Health Dept. Aware to provide a copy of the vaccination record if obtained from local pharmacy or Health Dept. Verbalized acceptance and understanding.  Pneumococcal vaccine status: Up to date  Covid-19 vaccine status: Declined, Education has been provided regarding the importance of this vaccine but patient still declined. Advised may receive this vaccine at local pharmacy or Health Dept.or vaccine clinic. Aware to provide a copy of the vaccination record if obtained from local pharmacy or Health Dept. Verbalized acceptance and understanding.  Qualifies for Shingles Vaccine? Yes -Patient declined.  Zostavax completed No   Shingrix Completed?: No.    Education has been provided regarding the importance of this vaccine. Patient has been advised to call insurance company to determine out of pocket expense if they have not yet received this vaccine. Advised may also receive vaccine at local pharmacy or Health Dept. Verbalized acceptance and understanding.  Screening Tests Health Maintenance  Topic Date Due   COVID-19 Vaccine (1) Never done   Zoster Vaccines- Shingrix (1 of 2) Never done   DEXA SCAN  Never done   Colonoscopy  01/19/2022   MAMMOGRAM  01/26/2022   INFLUENZA VACCINE  06/18/2023   Medicare Annual Wellness (AWV)  11/29/2024   DTaP/Tdap/Td (3 - Td or Tdap) 02/10/2033   Pneumonia Vaccine 39+ Years old  Completed   Hepatitis C Screening  Completed   HPV VACCINES   Aged Out    Health Maintenance  Health Maintenance Due  Topic Date Due   COVID-19 Vaccine (1) Never done   Zoster Vaccines- Shingrix (1 of 2) Never done   DEXA SCAN  Never done   Colonoscopy  01/19/2022   MAMMOGRAM  01/26/2022   INFLUENZA VACCINE  06/18/2023    Colorectal cancer screening: Type of screening: Colonoscopy. Completed 01/20/2012. Repeat every 10 years-Patient is overdue.  Mammogram status: Completed 01/27/2020. Repeat every year-Patient is overdue.  Bone Density status: Never done.  Lung Cancer Screening: (Low Dose CT Chest recommended if Age 34-80 years, 20 pack-year currently smoking OR have quit w/in 15years.) does not qualify.   Lung Cancer Screening Referral: no  Additional Screening:  Hepatitis C Screening: does qualify; Completed 12/16/2019  Vision Screening: Recommended annual ophthalmology exams for early detection of glaucoma and other disorders of the eye. Is the patient up to date with their annual eye exam?  Yes  Who is the provider or what is the name of the office in which the patient attends annual eye exams? Patient could not remember name of optometrist. If pt is not established with a provider, would they like to be referred to a provider to establish care? No .   Dental Screening: Recommended annual dental exams for proper oral hygiene  Community Resource Referral / Chronic Care Management: CRR required this visit?  No   CCM required this visit?  No     Plan:     I have personally reviewed and noted the following in the patient's chart:   Medical and social history Use of alcohol, tobacco or illicit drugs  Current medications and supplements including opioid prescriptions. Patient is not currently taking opioid prescriptions. Functional ability and status Nutritional status Physical activity Advanced directives List of other physicians Hospitalizations, surgeries, and ER visits in previous 12 months Vitals Screenings to include  cognitive, depression, and falls Referrals and appointments  In addition, I have reviewed and discussed with patient certain preventive protocols, quality metrics, and best practice recommendations. A written personalized care plan for preventive services as well as general preventive health recommendations were provided to patient.     Roz LOISE Fuller, LPN   8/86/7974   After Visit Summary: (Declined) Due to this being a telephonic visit, with patients personalized plan was offered to patient but patient Declined AVS at this time   Nurse Notes: Patient is due for mammogram, colonoscopy and dexa scan.  Patient refused flu vaccine, shingrix vaccine and covid vaccine.

## 2023-11-30 NOTE — Progress Notes (Signed)

## 2023-11-30 NOTE — Patient Instructions (Signed)
 Ms. Rathod , Thank you for taking time to come for your Medicare Wellness Visit. I appreciate your ongoing commitment to your health goals. Please review the following plan we discussed and let me know if I can assist you in the future.   Referrals/Orders/Follow-Ups/Clinician Recommendations: Yes; Keep maintaining your health by keeping your appointments with Dr. Alba and any specialists that you may see.  Call us  if you need anything.  Have a great year!!!!  This is a list of the screening recommended for you and due dates:  Health Maintenance  Topic Date Due   COVID-19 Vaccine (1) Never done   Zoster (Shingles) Vaccine (1 of 2) Never done   DEXA scan (bone density measurement)  Never done   Colon Cancer Screening  01/19/2022   Mammogram  01/26/2022   Flu Shot  06/18/2023   Medicare Annual Wellness Visit  11/29/2024   DTaP/Tdap/Td vaccine (3 - Td or Tdap) 02/10/2033   Pneumonia Vaccine  Completed   Hepatitis C Screening  Completed   HPV Vaccine  Aged Out    Advanced directives: (Declined) Advance directive discussed with you today. Even though you declined this today, please call our office should you change your mind, and we can give you the proper paperwork for you to fill out.  Next Medicare Annual Wellness Visit scheduled for next year: No

## 2023-12-03 ENCOUNTER — Other Ambulatory Visit: Payer: Self-pay | Admitting: Family Medicine

## 2023-12-03 DIAGNOSIS — C50812 Malignant neoplasm of overlapping sites of left female breast: Secondary | ICD-10-CM

## 2023-12-03 DIAGNOSIS — Z1211 Encounter for screening for malignant neoplasm of colon: Secondary | ICD-10-CM

## 2023-12-03 DIAGNOSIS — Z13820 Encounter for screening for osteoporosis: Secondary | ICD-10-CM

## 2023-12-03 DIAGNOSIS — Z170 Estrogen receptor positive status [ER+]: Secondary | ICD-10-CM

## 2023-12-03 NOTE — Progress Notes (Signed)
Had AWV. Needs Colonoscopy and Dexa scan. Has history of breast cancer. Will likely need follow-up with oncology if positive.

## 2023-12-08 ENCOUNTER — Encounter: Payer: Self-pay | Admitting: Family Medicine

## 2023-12-08 ENCOUNTER — Ambulatory Visit: Payer: 59 | Admitting: Family Medicine

## 2023-12-08 VITALS — BP 148/91 | HR 82 | Ht 64.0 in | Wt 115.4 lb

## 2023-12-08 DIAGNOSIS — Z8673 Personal history of transient ischemic attack (TIA), and cerebral infarction without residual deficits: Secondary | ICD-10-CM

## 2023-12-08 DIAGNOSIS — R7303 Prediabetes: Secondary | ICD-10-CM | POA: Diagnosis not present

## 2023-12-08 DIAGNOSIS — J449 Chronic obstructive pulmonary disease, unspecified: Secondary | ICD-10-CM

## 2023-12-08 DIAGNOSIS — E876 Hypokalemia: Secondary | ICD-10-CM

## 2023-12-08 DIAGNOSIS — Z170 Estrogen receptor positive status [ER+]: Secondary | ICD-10-CM

## 2023-12-08 DIAGNOSIS — C50812 Malignant neoplasm of overlapping sites of left female breast: Secondary | ICD-10-CM

## 2023-12-08 DIAGNOSIS — I2510 Atherosclerotic heart disease of native coronary artery without angina pectoris: Secondary | ICD-10-CM | POA: Diagnosis not present

## 2023-12-08 DIAGNOSIS — I10 Essential (primary) hypertension: Secondary | ICD-10-CM

## 2023-12-08 LAB — POCT GLYCOSYLATED HEMOGLOBIN (HGB A1C): Hemoglobin A1C: 5.9 % — AB (ref 4.0–5.6)

## 2023-12-08 MED ORDER — ALBUTEROL SULFATE HFA 108 (90 BASE) MCG/ACT IN AERS: 2.0000 | INHALATION_SPRAY | Freq: Four times a day (QID) | RESPIRATORY_TRACT | 1 refills | Status: DC | PRN

## 2023-12-08 MED ORDER — NITROGLYCERIN 0.4 MG SL SUBL: 0.4000 mg | SUBLINGUAL_TABLET | SUBLINGUAL | 0 refills | Status: AC | PRN

## 2023-12-08 MED ORDER — ASPIRIN 81 MG PO TBEC: 81.0000 mg | DELAYED_RELEASE_TABLET | Freq: Every day | ORAL | 3 refills | Status: AC

## 2023-12-08 NOTE — Patient Instructions (Addendum)
It was great to see you! Thank you for allowing me to participate in your care!  Our plans for today:  - Please go get your mammogram done today. - I will let you know the results of you labs. - [phone] Please call your oncologist to schedule a follow-up appointment.   Please arrive 15 minutes PRIOR to your next scheduled appointment time! If you do not, this affects OTHER patients' care.  Take care and seek immediate care sooner if you develop any concerns.   Celine Mans, MD, PGY-2 [facility] 11:10 AM 12/08/2023  [facility]

## 2023-12-08 NOTE — Assessment & Plan Note (Signed)
Patient requesting refill of albuterol.  Refill sent to pharmacy.

## 2023-12-08 NOTE — Progress Notes (Signed)
    SUBJECTIVE:   CHIEF COMPLAINT / HPI: check-up  Chronic vision changes including glaucoma and cataracts. Saw eye doctor 2 months ago. Using eye drops.   Medications -Albuterol as needed, needs refill -Amlodipine taking, did not take this morning -Aspirin taking, needs refill -Lipitor taking -Plavix taking -Nitrostat, uses as needed, needs refill -Potassium taking -Spironolactone taking  States she sees Dr. Sharyn Lull for cardiology. I however, see no notes from  BP at Home -States BP at home is up and down -Does not know the numbers -Did not take medications today  PERTINENT  PMH / PSH: HTN, CAD, COPD, Chronic Hep C  OBJECTIVE:   BP (!) 148/91   Pulse 82   Ht 5\' 4"  (1.626 m)   Wt 115 lb 6.4 oz (52.3 kg)   SpO2 97%   BMI 19.81 kg/m   General: NAD, chronically ill-appearing Neuro: A&O, walks with walker Cardiovascular: RRR, no murmurs, no peripheral edema Respiratory: normal WOB on RA, CTAB, no wheezes, ronchi or rales Abdomen: soft, NTTP, no rebound or guarding Extremities: Moving all 4 extremities equally   ASSESSMENT/PLAN:   Assessment & Plan Malignant neoplasm of overlapping sites of left breast in female, estrogen receptor positive (HCC) Per review of last oncology note, recommended to have repeat mammogram done in 2023.  Ordered and counseled patient to return for new mammogram.  Will provide phone number to follow-up with oncology as well.  Prediabetes A1c 5.9 today.  Continue diet control.  Recheck 6 months. Hypokalemia Recheck BMP today.  Patient reports she is continuing to take potassium.  She also notably takes potassium sparing diuretic spironolactone. Coronary artery disease involving native heart without angina pectoris, unspecified vessel or lesion type Requesting refill of as needed Nitrostat. Will provide short as needed refill. Requested records from Dr. Annitta Jersey office. History of stroke Continue Lipitor 40 mg daily.  Recheck lipid panel  today. Chronic obstructive pulmonary disease, unspecified COPD type (HCC) Patient requesting refill of albuterol.  Refill sent to pharmacy. Essential hypertension Elevated x2 today. Patient did not take meds today. Follow-up 1 month. Continue Amlodipine and Spironolactone.  Return in about 4 weeks (around 01/05/2024) for BP f/u.  Celine Mans, MD [facility]

## 2023-12-08 NOTE — Assessment & Plan Note (Signed)
Recheck BMP today.  Patient reports she is continuing to take potassium.  She also notably takes potassium sparing diuretic spironolactone.

## 2023-12-08 NOTE — Assessment & Plan Note (Signed)
Elevated x2 today. Patient did not take meds today. Follow-up 1 month. Continue Amlodipine and Spironolactone.

## 2023-12-08 NOTE — Assessment & Plan Note (Addendum)
Requesting refill of as needed Nitrostat. Will provide short as needed refill. Requested records from Dr. Annitta Jersey office.

## 2023-12-08 NOTE — Assessment & Plan Note (Signed)
Continue Lipitor 40 mg daily.  Recheck lipid panel today.

## 2023-12-08 NOTE — Assessment & Plan Note (Signed)
Per review of last oncology note, recommended to have repeat mammogram done in 2023.  Ordered and counseled patient to return for new mammogram.  Will provide phone number to follow-up with oncology as well.

## 2023-12-09 ENCOUNTER — Encounter: Payer: Self-pay | Admitting: Family Medicine

## 2023-12-09 LAB — LIPID PANEL
Chol/HDL Ratio: 4.2 {ratio} (ref 0.0–4.4)
Cholesterol, Total: 246 mg/dL — ABNORMAL HIGH (ref 100–199)
HDL: 59 mg/dL (ref >39)
LDL Chol Calc (NIH): 157 mg/dL — ABNORMAL HIGH (ref 0–99)
Triglycerides: 166 mg/dL — ABNORMAL HIGH (ref 0–149)
VLDL Cholesterol Cal: 30 mg/dL (ref 5–40)

## 2023-12-09 LAB — BASIC METABOLIC PANEL
BUN/Creatinine Ratio: 16 (ref 12–28)
BUN: 16 mg/dL (ref 8–27)
CO2: 24 mmol/L (ref 20–29)
Calcium: 10.6 mg/dL — ABNORMAL HIGH (ref 8.7–10.3)
Chloride: 102 mmol/L (ref 96–106)
Creatinine, Ser: 1.02 mg/dL — ABNORMAL HIGH (ref 0.57–1.00)
Glucose: 97 mg/dL (ref 70–99)
Potassium: 4.6 mmol/L (ref 3.5–5.2)
Sodium: 141 mmol/L (ref 134–144)
eGFR: 60 mL/min/{1.73_m2} (ref >59)

## 2023-12-09 NOTE — Progress Notes (Signed)
Mildly elevated calcium. Recheck at next visit. Letter sent.

## 2024-02-08 ENCOUNTER — Ambulatory Visit: Payer: Self-pay | Admitting: Family Medicine

## 2024-02-10 NOTE — Telephone Encounter (Signed)
error 

## 2024-02-16 ENCOUNTER — Ambulatory Visit: Admitting: Family Medicine

## 2024-02-16 NOTE — Progress Notes (Deleted)
    SUBJECTIVE:   CHIEF COMPLAINT / HPI:   Needs repeat diagnostic mammogram for history breast cancer. Needs oncology f/u.  Needs to increase statin ***.  Needs DEXA. Needs colonoscopy.  PERTINENT  PMH / PSH: HTN, CAD, COPD, Chronic Hep C  OBJECTIVE:   There were no vitals taken for this visit.  ***  ASSESSMENT/PLAN:   Assessment & Plan  No follow-ups on file.  Celine Mans, MD [facility]

## 2024-03-02 ENCOUNTER — Ambulatory Visit: Payer: 59 | Admitting: Podiatry

## 2024-03-07 ENCOUNTER — Ambulatory Visit: Admitting: Family Medicine

## 2024-03-07 ENCOUNTER — Encounter: Payer: Self-pay | Admitting: Family Medicine

## 2024-03-07 VITALS — BP 188/90 | HR 77 | Ht 64.0 in | Wt 132.0 lb

## 2024-03-07 DIAGNOSIS — C50812 Malignant neoplasm of overlapping sites of left female breast: Secondary | ICD-10-CM

## 2024-03-07 DIAGNOSIS — I10 Essential (primary) hypertension: Secondary | ICD-10-CM | POA: Diagnosis not present

## 2024-03-07 DIAGNOSIS — R32 Unspecified urinary incontinence: Secondary | ICD-10-CM

## 2024-03-07 DIAGNOSIS — Z170 Estrogen receptor positive status [ER+]: Secondary | ICD-10-CM | POA: Diagnosis not present

## 2024-03-07 NOTE — Patient Instructions (Addendum)
 It was great to see you! Thank you for allowing me to participate in your care!  Our plans for today:  - I will let you know the results of your urine testing. - Please make sure to get your mammogram and see your oncologist soon. - You should get a call from physical therapy to schedule an appointment.  You can call [facility] at [phone] to schedule your mammogram.   Please arrive 15 minutes PRIOR to your next scheduled appointment time! If you do not, this affects OTHER patients' care.  Take care and seek immediate care sooner if you develop any concerns.   Ivin Marrow, MD, PGY-2 [facility] 3:21 PM 03/07/2024  [facility]

## 2024-03-07 NOTE — Progress Notes (Signed)
    SUBJECTIVE:   CHIEF COMPLAINT / HPI: BP f/u  Needs mammogram and oncology f/u. Lives with son. Daughter present today. Has a difficulty laying on left side where she had the breast cancer. No weight loss.  HTN Did not take medicines this morning. Cannot tell me what she is taking.  Bladder -Having frequency -Does not know when she needs to urinate ->6 pampers per day -no burning -ongoing for a long time -has not tried any medicines in a long time -Has had difficulty since her stroke  PERTINENT  PMH / PSH: HTN, CAD, COPD, Liver fibrosis, Breast cancer  OBJECTIVE:   BP (!) 188/90   Pulse 77   Ht 5\' 4"  (1.626 m)   Wt 132 lb (59.9 kg)   SpO2 97%   BMI 22.66 kg/m   General: NAD, chronically ill appearing, using walker  Neuro: A&O Cardiovascular: RRR, no murmurs, no peripheral edema Respiratory: normal WOB on RA, CTAB, no wheezes, ronchi or rales Extremities: Moving all 4 extremities equally   ASSESSMENT/PLAN:   Assessment & Plan Malignant neoplasm of overlapping sites of left breast in female, estrogen receptor positive (HCC) Reemphasize importance to follow-up with her oncologist.  Patient and daughter said that they would call to attempt quicker appointment given prior patient history.  I have also replaced referral.  Explained that she also needs to go repeat her diagnostic mammogram.  This is already ordered. Urinary incontinence, unspecified type Suspicious for neurogenic bladder secondary to central neurologic changes after stroke.  Will rule out UTI.  Will also trial pelvic floor therapy however unlikely to help given likely neurogenic. Essential hypertension Uncontrolled, however patient did not take blood pressure medicines today. Instructed to bring medicines in at next appointment. Follow-up at 1 month  Return in about 1 month (around 04/06/2024).  Ivin Marrow, MD [facility]

## 2024-03-08 DIAGNOSIS — R32 Unspecified urinary incontinence: Secondary | ICD-10-CM | POA: Insufficient documentation

## 2024-03-08 LAB — URINALYSIS
Bilirubin, UA: NEGATIVE
Glucose, UA: NEGATIVE
Ketones, UA: NEGATIVE
Nitrite, UA: POSITIVE — AB
Protein,UA: NEGATIVE
RBC, UA: NEGATIVE
Specific Gravity, UA: 1.014 (ref 1.005–1.030)
Urobilinogen, Ur: 0.2 mg/dL (ref 0.2–1.0)
pH, UA: 6 (ref 5.0–7.5)

## 2024-03-08 NOTE — Assessment & Plan Note (Signed)
 Uncontrolled, however patient did not take blood pressure medicines today. Instructed to bring medicines in at next appointment. Follow-up at 1 month

## 2024-03-08 NOTE — Assessment & Plan Note (Signed)
 Suspicious for neurogenic bladder secondary to central neurologic changes after stroke.  Will rule out UTI.  Will also trial pelvic floor therapy however unlikely to help given likely neurogenic.

## 2024-03-08 NOTE — Assessment & Plan Note (Signed)
 Reemphasize importance to follow-up with her oncologist.  Patient and daughter said that they would call to attempt quicker appointment given prior patient history.  I have also replaced referral.  Explained that she also needs to go repeat her diagnostic mammogram.  This is already ordered.

## 2024-03-09 ENCOUNTER — Encounter: Payer: Self-pay | Admitting: Family Medicine

## 2024-03-29 ENCOUNTER — Inpatient Hospital Stay: Attending: Hematology and Oncology | Admitting: Hematology and Oncology

## 2024-03-29 NOTE — Assessment & Plan Note (Deleted)
 08/13/2017: Mammogram and ultrasound:Large left breast mass involving all quadrants measuring 7.5 cm with enlarged axillary lymph node, biopsy revealed high-grade  DCIS ER 40% PR 20%,, lymph node biopsy negative, Tis N0 stage 0   09/08/2017: Left mastectomy: IDC grade 2, 0.6 cm, extensive DCIS, high-grade with necrosis, margins negative, 1/8 lymph node with micrometastatic disease, T1BN1 mic stage I a, ER 100%, PR 100%, Ki-67 70%, HER-2 negative ratio 1.23. Did not receive radiation because of comorbidities   Current treatment: antiestrogen therapy with anastrozole5 years started 09/28/2017 Patient history of  Stroke and coronary artery disease  She has borderline performance status.   Anastrozole  toxicities: Denies any hot flashes or myalgias.    CT CAP 01/20/2020: No evidence of metastatic disease.   Breast cancer surveillance: 1.  Breast exam 03/29/2024: Benign 2.  Right breast mammogram scheduled for 03/31/2024   Return to clinic in 1 year for follow-up

## 2024-03-30 ENCOUNTER — Inpatient Hospital Stay: Admitting: Hematology and Oncology

## 2024-03-31 ENCOUNTER — Ambulatory Visit

## 2024-04-04 ENCOUNTER — Ambulatory Visit: Admitting: Hematology and Oncology

## 2024-04-11 ENCOUNTER — Emergency Department (HOSPITAL_COMMUNITY)

## 2024-04-11 ENCOUNTER — Other Ambulatory Visit: Payer: Self-pay

## 2024-04-11 ENCOUNTER — Encounter (HOSPITAL_COMMUNITY): Payer: Self-pay

## 2024-04-11 ENCOUNTER — Emergency Department (HOSPITAL_COMMUNITY)
Admission: EM | Admit: 2024-04-11 | Discharge: 2024-04-11 | Disposition: A | Discharge status: Home / Self Care | Attending: Emergency Medicine | Admitting: Emergency Medicine

## 2024-04-11 DIAGNOSIS — N3000 Acute cystitis without hematuria: Secondary | ICD-10-CM | POA: Diagnosis not present

## 2024-04-11 DIAGNOSIS — I2510 Atherosclerotic heart disease of native coronary artery without angina pectoris: Secondary | ICD-10-CM | POA: Insufficient documentation

## 2024-04-11 DIAGNOSIS — E876 Hypokalemia: Secondary | ICD-10-CM | POA: Diagnosis not present

## 2024-04-11 DIAGNOSIS — J449 Chronic obstructive pulmonary disease, unspecified: Secondary | ICD-10-CM | POA: Insufficient documentation

## 2024-04-11 DIAGNOSIS — R519 Headache, unspecified: Secondary | ICD-10-CM | POA: Diagnosis present

## 2024-04-11 DIAGNOSIS — Z7901 Long term (current) use of anticoagulants: Secondary | ICD-10-CM | POA: Insufficient documentation

## 2024-04-11 DIAGNOSIS — Z7982 Long term (current) use of aspirin: Secondary | ICD-10-CM | POA: Diagnosis not present

## 2024-04-11 LAB — CBC WITH DIFFERENTIAL/PLATELET
Abs Immature Granulocytes: 0.02 10*3/uL (ref 0.00–0.07)
Basophils Absolute: 0 10*3/uL (ref 0.0–0.1)
Basophils Relative: 0 %
Eosinophils Absolute: 0.1 10*3/uL (ref 0.0–0.5)
Eosinophils Relative: 1 %
HCT: 34.9 % — ABNORMAL LOW (ref 36.0–46.0)
Hemoglobin: 11.7 g/dL — ABNORMAL LOW (ref 12.0–15.0)
Immature Granulocytes: 0 %
Lymphocytes Relative: 29 %
Lymphs Abs: 2.4 10*3/uL (ref 0.7–4.0)
MCH: 29.3 pg (ref 26.0–34.0)
MCHC: 33.5 g/dL (ref 30.0–36.0)
MCV: 87.5 fL (ref 80.0–100.0)
Monocytes Absolute: 0.4 10*3/uL (ref 0.1–1.0)
Monocytes Relative: 5 %
Neutro Abs: 5.3 10*3/uL (ref 1.7–7.7)
Neutrophils Relative %: 65 %
Platelets: 285 10*3/uL (ref 150–400)
RBC: 3.99 MIL/uL (ref 3.87–5.11)
RDW: 14.4 % (ref 11.5–15.5)
WBC: 8.2 10*3/uL (ref 4.0–10.5)
nRBC: 0 % (ref 0.0–0.2)

## 2024-04-11 LAB — URINALYSIS, ROUTINE W REFLEX MICROSCOPIC
Bilirubin Urine: NEGATIVE
Glucose, UA: NEGATIVE mg/dL
Hgb urine dipstick: NEGATIVE
Ketones, ur: NEGATIVE mg/dL
Leukocytes,Ua: NEGATIVE
Nitrite: POSITIVE — AB
Protein, ur: NEGATIVE mg/dL
Specific Gravity, Urine: 1.013 (ref 1.005–1.030)
pH: 5 (ref 5.0–8.0)

## 2024-04-11 LAB — COMPREHENSIVE METABOLIC PANEL WITH GFR
ALT: 12 U/L (ref 0–44)
AST: 25 U/L (ref 15–41)
Albumin: 4 g/dL (ref 3.5–5.0)
Alkaline Phosphatase: 77 U/L (ref 38–126)
Anion gap: 6 (ref 5–15)
BUN: 14 mg/dL (ref 8–23)
CO2: 27 mmol/L (ref 22–32)
Calcium: 9.7 mg/dL (ref 8.9–10.3)
Chloride: 104 mmol/L (ref 98–111)
Creatinine, Ser: 1.01 mg/dL — ABNORMAL HIGH (ref 0.44–1.00)
GFR, Estimated: >60 mL/min (ref >60)
Glucose, Bld: 162 mg/dL — ABNORMAL HIGH (ref 70–99)
Potassium: 3.1 mmol/L — ABNORMAL LOW (ref 3.5–5.1)
Sodium: 137 mmol/L (ref 135–145)
Total Bilirubin: 0.6 mg/dL (ref 0.0–1.2)
Total Protein: 7.6 g/dL (ref 6.5–8.1)

## 2024-04-11 MED ORDER — ACETAMINOPHEN 325 MG PO TABS
650.0000 mg | ORAL_TABLET | Freq: Once | ORAL | Status: DC
  Filled 2024-04-11: qty 2

## 2024-04-11 MED ORDER — SODIUM CHLORIDE 0.9 % IV BOLUS
500.0000 mL | Freq: Once | INTRAVENOUS | Status: AC
  Administered 2024-04-11: 500 mL via INTRAVENOUS

## 2024-04-11 MED ORDER — POTASSIUM CHLORIDE CRYS ER 20 MEQ PO TBCR
40.0000 meq | EXTENDED_RELEASE_TABLET | Freq: Once | ORAL | Status: AC
  Administered 2024-04-11: 40 meq via ORAL
  Filled 2024-04-11: qty 2

## 2024-04-11 MED ORDER — CEFTRIAXONE SODIUM 2 G IJ SOLR
2.0000 g | Freq: Once | INTRAMUSCULAR | Status: AC
  Administered 2024-04-11: 2 g via INTRAVENOUS
  Filled 2024-04-11: qty 20

## 2024-04-11 MED ORDER — CEPHALEXIN 500 MG PO CAPS: 500.0000 mg | ORAL_CAPSULE | Freq: Four times a day (QID) | ORAL | 0 refills | Status: AC

## 2024-04-11 MED ORDER — SODIUM CHLORIDE 0.9 % IV BOLUS: 500.0000 mL | Freq: Once | INTRAVENOUS | Status: DC

## 2024-04-11 NOTE — Discharge Instructions (Signed)
 Follow-up with your family doctor for recheck of urinary tract infection next week.  Your family doctor should also arrange for you to get an ultrasound of your thyroid .  Also follow-up with a dentist in the next couple weeks

## 2024-04-11 NOTE — ED Notes (Signed)
 Daughter had left bedside so nurse called to get an update on pt ride. Daughter stated her ride stated they would be here in 10 mins. Daughter is checking on the ride.

## 2024-04-11 NOTE — ED Notes (Signed)
 Patient transported to CT

## 2024-04-11 NOTE — ED Notes (Signed)
 Pt refused tylenol  stating another dr told her not to take it.
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
## 2024-04-11 NOTE — ED Notes (Signed)
 Crackers and water  given to pt per request and EDP approved.

## 2024-04-11 NOTE — ED Notes (Signed)
 Pt is L arm restricted--armband placed on restricted side. And pt is partially blind at baseline

## 2024-04-11 NOTE — ED Triage Notes (Signed)
 Pt arrived via [facility] from home where pt lives with her son, c/o "think I has a virus or something viral" c/o HA. Per EMS per son, pt has a hx of UTIs and thinks she has a UTI as she is confused--which is typically the sign she has a UTI

## 2024-04-13 LAB — URINE CULTURE

## 2024-04-15 ENCOUNTER — Ambulatory Visit
Admission: RE | Admit: 2024-04-15 | Discharge: 2024-04-15 | Disposition: A | Discharge status: Home / Self Care | Source: Ambulatory Visit | Attending: Family Medicine | Admitting: Family Medicine

## 2024-04-15 DIAGNOSIS — Z170 Estrogen receptor positive status [ER+]: Secondary | ICD-10-CM

## 2024-04-19 ENCOUNTER — Inpatient Hospital Stay: Attending: Hematology and Oncology | Admitting: Hematology and Oncology

## 2024-04-19 VITALS — BP 200/110 | HR 95 | Temp 98.3°F | Resp 16 | Ht 64.0 in | Wt 128.6 lb

## 2024-04-19 DIAGNOSIS — Z853 Personal history of malignant neoplasm of breast: Secondary | ICD-10-CM | POA: Diagnosis present

## 2024-04-19 DIAGNOSIS — D0512 Intraductal carcinoma in situ of left breast: Secondary | ICD-10-CM

## 2024-04-19 DIAGNOSIS — Z9012 Acquired absence of left breast and nipple: Secondary | ICD-10-CM | POA: Insufficient documentation

## 2024-04-19 DIAGNOSIS — R32 Unspecified urinary incontinence: Secondary | ICD-10-CM | POA: Diagnosis not present

## 2024-04-19 DIAGNOSIS — Z8673 Personal history of transient ischemic attack (TIA), and cerebral infarction without residual deficits: Secondary | ICD-10-CM | POA: Insufficient documentation

## 2024-04-19 NOTE — Progress Notes (Signed)
 Patient Care Team: Ivin Marrow, MD as PCP - General (Family Medicine) Cameron Cea, MD as Consulting Physician (Hematology and Oncology) Debbie Fails, Laura Polio, NP as Nurse Practitioner (Hematology and Oncology) Retta Caster, MD as Consulting Physician (Radiation Oncology) Oza Blumenthal, MD as Consulting Physician (General Surgery)  DIAGNOSIS:  Encounter Diagnosis  Name Primary?   Ductal carcinoma in situ (DCIS) of left breast Yes    SUMMARY OF ONCOLOGIC HISTORY: Oncology History  Ductal carcinoma in situ (DCIS) of left breast  08/13/2017 Initial Diagnosis   Large left breast mass involving all quadrants measuring 7.5 cm with enlarged axillary lymph node, biopsy revealed high-grade  DCIS ER 40% PR 20%,, lymph node biopsy negative, Tis N0 stage 0   09/08/2017 Surgery   Left mastectomy: IDC grade 2, 0.6 cm, extensive high-grade DCIS with necrosis, margins negative, 1/8 lymph nodes with micrometastases, T1bN1Mic (Stage 1A)   Malignant neoplasm of overlapping sites of left breast in female, estrogen receptor positive (HCC)  08/19/2017 Initial Diagnosis   Malignant neoplasm of overlapping sites of left breast in female, estrogen receptor positive (HCC)   09/08/2017 Surgery   Left mastectomy: IDC grade 2, 0.6 cm, extensive DCIS, high-grade with necrosis, margins negative, 1/8 lymph node with micrometastatic disease, T1BN1 mic stage I a, ER 100%, PR 100%, Ki-67 70%, HER-2 negative ratio 1.23.   09/2017 -  Anti-estrogen oral therapy   Anastrozole  1mg  daily     CHIEF COMPLIANT: Surveillance of breast cancer  HISTORY OF PRESENT ILLNESS:  History of Present Illness [name] is a 68 year old female with a history of breast cancer who presents for follow-up with her oncologist. She was referred by her family doctor to follow up with her oncologist and ensure there are no ongoing issues related to her breast cancer.  Diagnosed with breast cancer in 2018, she underwent  surgery on September 08, 2017, and completed a five-year course of anastrozole . She has been in remission since then. A recent mammogram was part of her routine follow-up care, and she has not noticed any new lumps or nodules in her breasts.     ALLERGIES:  is allergic to bee venom and other.  MEDICATIONS:  Current Outpatient Medications  Medication Sig Dispense Refill   albuterol  (VENTOLIN  HFA) 108 (90 Base) MCG/ACT inhaler Inhale 2 puffs into the lungs every 6 (six) hours as needed for wheezing. For wheezing 6.7 g 1   aspirin  EC 81 MG tablet Take 1 tablet (81 mg total) by mouth daily. 90 tablet 3   atorvastatin  (LIPITOR) 20 MG tablet Take 20 mg by mouth daily.     carbamazepine  (TEGRETOL ) 200 MG tablet Take 200 mg by mouth 2 (two) times daily.     carvedilol  (COREG ) 12.5 MG tablet Take 12.5 mg by mouth 2 (two) times daily.     cephALEXin  (KEFLEX ) 500 MG capsule Take 1 capsule (500 mg total) by mouth 4 (four) times daily. 28 capsule 0   clopidogrel  (PLAVIX ) 75 MG tablet Take 75 mg by mouth daily.      lisinopril -hydrochlorothiazide  (ZESTORETIC ) 20-12.5 MG tablet Take 1 tablet by mouth 2 (two) times daily.     nitroGLYCERIN  (NITROSTAT ) 0.4 MG SL tablet Place 1 tablet (0.4 mg total) under the tongue every 5 (five) minutes as needed for chest pain. 30 tablet 0   ofloxacin  (OCUFLOX ) 0.3 % ophthalmic solution Place 1 drop into the left eye 3 (three) times daily.     spironolactone  (ALDACTONE ) 25 MG tablet Take 25 mg by  mouth daily.     amLODipine  (NORVASC ) 10 MG tablet Take 1 tablet (10 mg total) by mouth daily. 30 tablet 2   potassium chloride  SA (K-DUR,KLOR-CON ) 20 MEQ tablet Take 1 tablet (20 mEq total) by mouth daily. (Patient not taking: Reported on 04/19/2024)     No current facility-administered medications for this visit.   Facility-Administered Medications Ordered in Other Visits  Medication Dose Route Frequency Provider Last Rate Last Admin   mitoMYcin  (MUTAMYCIN ) Injection Use in OR  only (0.4 mg/ml)  0.5 mL Right Eye Once Ben Bracken, MD        PHYSICAL EXAMINATION: ECOG PERFORMANCE STATUS: 1 - Symptomatic but completely ambulatory  Vitals:   04/19/24 0800  BP: (!) 200/110  Pulse: 95  Resp: 16  Temp: 98.3 F (36.8 C)  SpO2: 100%   Filed Weights   04/19/24 0800  Weight: 128 lb 9.6 oz (58.3 kg)    Physical Exam BREAST: Breasts normal on palpation.  (exam performed in the presence of a chaperone)  LABORATORY DATA:  I have reviewed the data as listed    Latest Ref Rng & Units 04/11/2024    3:19 PM 12/08/2023   11:52 AM 06/01/2023    6:14 AM  CMP  Glucose 70 - 99 mg/dL 161  97  096   BUN 8 - 23 mg/dL 14  16  11    Creatinine 0.44 - 1.00 mg/dL 0.45  4.09  8.11   Sodium 135 - 145 mmol/L 137  141  134   Potassium 3.5 - 5.1 mmol/L 3.1  4.6  3.1   Chloride 98 - 111 mmol/L 104  102  99   CO2 22 - 32 mmol/L 27  24  28    Calcium  8.9 - 10.3 mg/dL 9.7  91.4  8.8   Total Protein 6.5 - 8.1 g/dL 7.6   6.1   Total Bilirubin 0.0 - 1.2 mg/dL 0.6   0.4   Alkaline Phos 38 - 126 U/L 77   56   AST 15 - 41 U/L 25   15   ALT 0 - 44 U/L 12   10     Lab Results  Component Value Date   WBC 8.2 04/11/2024   HGB 11.7 (L) 04/11/2024   HCT 34.9 (L) 04/11/2024   MCV 87.5 04/11/2024   PLT 285 04/11/2024   NEUTROABS 5.3 04/11/2024    ASSESSMENT & PLAN:  Ductal carcinoma in situ (DCIS) of left breast 08/13/2017: Mammogram and ultrasound:Large left breast mass involving all quadrants measuring 7.5 cm with enlarged axillary lymph node, biopsy revealed high-grade  DCIS ER 40% PR 20%,, lymph node biopsy negative, Tis N0 stage 0   09/08/2017: Left mastectomy: IDC grade 2, 0.6 cm, extensive DCIS, high-grade with necrosis, margins negative, 1/8 lymph node with micrometastatic disease, T1BN1 mic stage I a, ER 100%, PR 100%, Ki-67 70%, HER-2 negative ratio 1.23. Did not receive radiation because of comorbidities   Current treatment: antiestrogen therapy with anastrozole5 years  started 09/28/2017- 2022 Patient history of  Stroke and coronary artery disease, visual impairment as well She has borderline performance status.   CT CAP 01/20/2020: No evidence of metastatic disease.   Breast cancer surveillance: 1.  Breast exam 04/19/2024: Benign 2.  Right breast mammogram  04/15/2024: Awaiting the results    Return to clinic on an as-needed basis  ------------------------------------- Assessment and Plan Assessment & Plan Breast cancer In remission, no active cancer. Normal recent mammogram and breast exam. Graduated from oncology care. -  Schedule annual mammogram for surveillance.  Urinary incontinence Frequent urination and occasional urinary and fecal incontinence reported. - Refer to urology for further evaluation and management.      No orders of the defined types were placed in this encounter.  The patient has a good understanding of the overall plan. she agrees with it. she will call with any problems that may develop before the next visit here. Total time spent: 30 mins including face to face time and time spent for planning, charting and co-ordination of care   Viinay K Lizette Pazos, MD 04/19/24

## 2024-04-19 NOTE — Assessment & Plan Note (Signed)
 08/13/2017: Mammogram and ultrasound:Large left breast mass involving all quadrants measuring 7.5 cm with enlarged axillary lymph node, biopsy revealed high-grade  DCIS ER 40% PR 20%,, lymph node biopsy negative, Tis N0 stage 0   09/08/2017: Left mastectomy: IDC grade 2, 0.6 cm, extensive DCIS, high-grade with necrosis, margins negative, 1/8 lymph node with micrometastatic disease, T1BN1 mic stage I a, ER 100%, PR 100%, Ki-67 70%, HER-2 negative ratio 1.23. Did not receive radiation because of comorbidities   Current treatment: antiestrogen therapy with anastrozole5 years started 09/28/2017- 2022 Patient history of  Stroke and coronary artery disease  She has borderline performance status.   CT CAP 01/20/2020: No evidence of metastatic disease.   Breast cancer surveillance: 1.  Breast exam 04/19/2024: Benign 2.  Right breast mammogram  04/15/2024     Return to clinic in 1 year for follow-up with long term survivorship

## 2024-04-21 ENCOUNTER — Ambulatory Visit: Payer: Self-pay | Admitting: Family Medicine

## 2024-04-22 ENCOUNTER — Other Ambulatory Visit: Payer: Self-pay

## 2024-04-22 ENCOUNTER — Emergency Department (HOSPITAL_COMMUNITY)
Admission: EM | Admit: 2024-04-22 | Discharge: 2024-04-22 | Disposition: A | Discharge status: Home / Self Care | Attending: Emergency Medicine | Admitting: Emergency Medicine

## 2024-04-22 ENCOUNTER — Encounter (HOSPITAL_COMMUNITY): Payer: Self-pay | Admitting: Emergency Medicine

## 2024-04-22 DIAGNOSIS — R09A9 Foreign body sensation, other site: Secondary | ICD-10-CM

## 2024-04-22 DIAGNOSIS — X58XXXA Exposure to other specified factors, initial encounter: Secondary | ICD-10-CM | POA: Diagnosis not present

## 2024-04-22 DIAGNOSIS — Z7982 Long term (current) use of aspirin: Secondary | ICD-10-CM | POA: Diagnosis not present

## 2024-04-22 DIAGNOSIS — S0991XA Unspecified injury of ear, initial encounter: Secondary | ICD-10-CM | POA: Diagnosis present

## 2024-04-22 DIAGNOSIS — S00412A Abrasion of left ear, initial encounter: Secondary | ICD-10-CM | POA: Diagnosis not present

## 2024-04-22 NOTE — Discharge Instructions (Signed)
 I don't see anything in your ear but I do see some abrasions to the ear canal likely from you cleaning it out. There is no damage to your ear drum. You may still have some abnormal sensation related to the abrasions but this should improve over the next few days.

## 2024-04-22 NOTE — ED Provider Notes (Signed)
 [place] EMERGENCY DEPARTMENT AT [facility] Provider Note   CSN: [id number] Arrival date & time: 04/22/24  0207     History  Chief Complaint  Patient presents with   Foreign Body in Ear    [name] is a 68 y.o. female.  68 year old female states that she was woken up from sleep with the feeling of a fluttering sensation in her left ear.  She thought there might be an insect in there.  She tried get an eye with her fingernail which did not work she tried flushing out with water  which initially did not work she called EMS came here for further evaluation.  She has no other associated symptoms.  States it feels better now.   Foreign Body in Ear       Home Medications Prior to Admission medications   Medication Sig Start Date End Date Taking? Authorizing Provider  albuterol  (VENTOLIN  HFA) 108 (90 Base) MCG/ACT inhaler Inhale 2 puffs into the lungs every 6 (six) hours as needed for wheezing. For wheezing 12/08/23   Ivin Marrow, MD  amLODipine  (NORVASC ) 10 MG tablet Take 1 tablet (10 mg total) by mouth daily. 06/03/23 09/01/23  ShahmehdiConstantino Demark, MD  aspirin  EC 81 MG tablet Take 1 tablet (81 mg total) by mouth daily. 12/08/23   Ivin Marrow, MD  atorvastatin  (LIPITOR) 20 MG tablet Take 20 mg by mouth daily. 01/14/24   [provider]  carbamazepine  (TEGRETOL ) 200 MG tablet Take 200 mg by mouth 2 (two) times daily. 08/11/22   [provider]  carvedilol  (COREG ) 12.5 MG tablet Take 12.5 mg by mouth 2 (two) times daily. 08/11/22   [provider]  cephALEXin  (KEFLEX ) 500 MG capsule Take 1 capsule (500 mg total) by mouth 4 (four) times daily. 04/11/24   Cheyenne Cotta, MD  clopidogrel  (PLAVIX ) 75 MG tablet Take 75 mg by mouth daily.     [provider]  lisinopril -hydrochlorothiazide  (ZESTORETIC ) 20-12.5 MG tablet Take 1 tablet by mouth 2 (two) times daily. 01/14/24   [provider]  nitroGLYCERIN  (NITROSTAT ) 0.4 MG SL tablet  Place 1 tablet (0.4 mg total) under the tongue every 5 (five) minutes as needed for chest pain. 12/08/23   Ivin Marrow, MD  ofloxacin  (OCUFLOX ) 0.3 % ophthalmic solution Place 1 drop into the left eye 3 (three) times daily. 04/02/23   [provider]  potassium chloride  SA (K-DUR,KLOR-CON ) 20 MEQ tablet Take 1 tablet (20 mEq total) by mouth daily. Patient not taking: Reported on 04/19/2024 10/24/18   Justina Oman, MD  spironolactone  (ALDACTONE ) 25 MG tablet Take 25 mg by mouth daily. 10/28/22   [provider]      Allergies    Bee venom and Other    Review of Systems   Review of Systems  Physical Exam Updated Vital Signs BP (!) 184/92   Pulse 82   Temp 97.8 F (36.6 C) (Oral)   Resp 16   Ht 5\' 4"  (1.626 m)   Wt 58.3 kg   SpO2 99%   BMI 22.07 kg/m  Physical Exam Vitals and nursing note reviewed.  Constitutional:      Appearance: She is well-developed.  HENT:     Head: Normocephalic and atraumatic.     Comments: Couple small abrasions in the external auditory canal on her left ear, no foreign body, TM intact without any effusion.  No surrounding erythema, swelling or other exam findings.  Right ear is Some cerumen but otherwise appears well.  Cardiovascular:     Rate and Rhythm: Normal rate and regular rhythm.  Pulmonary:     Effort: No respiratory distress.     Breath sounds: No stridor.  Abdominal:     General: There is no distension.  Musculoskeletal:     Cervical back: Normal range of motion.  Neurological:     Mental Status: She is alert.     ED Results / Procedures / Treatments   Labs (all labs ordered are listed, but only abnormal results are displayed) Labs Reviewed - No data to display  EKG None  Radiology No results found.  Procedures Procedures    Medications Ordered in ED Medications - No data to display  ED Course/ Medical Decision Making/ A&P                                 Medical Decision Making  Exam without  foreign body but she does have some small abrasions consistent with trauma from trying to get the foreign body out.  No significant damage and should heal well on its own.  Stable for discharge.  Final Clinical Impression(s) / ED Diagnoses Final diagnoses:  Foreign body sensation in ear canal    Rx / DC Orders ED Discharge Orders     None         Bralynn Velador, Reymundo Caulk, MD 04/22/24 [phone]

## 2024-04-22 NOTE — ED Triage Notes (Addendum)
 Pt to ed from home via [facility]. C/o of something inside left ear that pt noticed when woke up. Pt states they can feel it moving. Looked in ears with otoscope, nothing seen at this time. NAD

## 2024-05-13 ENCOUNTER — Encounter (HOSPITAL_COMMUNITY): Payer: Self-pay | Admitting: *Deleted

## 2024-05-13 ENCOUNTER — Emergency Department (HOSPITAL_COMMUNITY)
Admission: EM | Admit: 2024-05-13 | Discharge: 2024-05-13 | Disposition: A | Discharge status: Home / Self Care | Attending: Emergency Medicine | Admitting: Emergency Medicine

## 2024-05-13 ENCOUNTER — Other Ambulatory Visit: Payer: Self-pay

## 2024-05-13 DIAGNOSIS — Z7902 Long term (current) use of antithrombotics/antiplatelets: Secondary | ICD-10-CM | POA: Diagnosis not present

## 2024-05-13 DIAGNOSIS — R569 Unspecified convulsions: Secondary | ICD-10-CM | POA: Insufficient documentation

## 2024-05-13 DIAGNOSIS — Z7982 Long term (current) use of aspirin: Secondary | ICD-10-CM | POA: Insufficient documentation

## 2024-05-13 LAB — COMPREHENSIVE METABOLIC PANEL WITH GFR
ALT: 8 U/L (ref 0–44)
AST: 14 U/L — ABNORMAL LOW (ref 15–41)
Albumin: 3.5 g/dL (ref 3.5–5.0)
Alkaline Phosphatase: 63 U/L (ref 38–126)
Anion gap: 11 (ref 5–15)
BUN: 14 mg/dL (ref 8–23)
CO2: 26 mmol/L (ref 22–32)
Calcium: 9.6 mg/dL (ref 8.9–10.3)
Chloride: 106 mmol/L (ref 98–111)
Creatinine, Ser: 0.98 mg/dL (ref 0.44–1.00)
GFR, Estimated: >60 mL/min (ref >60)
Glucose, Bld: 120 mg/dL — ABNORMAL HIGH (ref 70–99)
Potassium: 3.8 mmol/L (ref 3.5–5.1)
Sodium: 143 mmol/L (ref 135–145)
Total Bilirubin: 0.5 mg/dL (ref 0.0–1.2)
Total Protein: 7.2 g/dL (ref 6.5–8.1)

## 2024-05-13 LAB — CBC WITH DIFFERENTIAL/PLATELET
Abs Immature Granulocytes: 0.02 10*3/uL (ref 0.00–0.07)
Basophils Absolute: 0 10*3/uL (ref 0.0–0.1)
Basophils Relative: 1 %
Eosinophils Absolute: 0.1 10*3/uL (ref 0.0–0.5)
Eosinophils Relative: 2 %
HCT: 33 % — ABNORMAL LOW (ref 36.0–46.0)
Hemoglobin: 10.8 g/dL — ABNORMAL LOW (ref 12.0–15.0)
Immature Granulocytes: 0 %
Lymphocytes Relative: 27 %
Lymphs Abs: 1.8 10*3/uL (ref 0.7–4.0)
MCH: 28.5 pg (ref 26.0–34.0)
MCHC: 32.7 g/dL (ref 30.0–36.0)
MCV: 87.1 fL (ref 80.0–100.0)
Monocytes Absolute: 0.6 10*3/uL (ref 0.1–1.0)
Monocytes Relative: 8 %
Neutro Abs: 4.2 10*3/uL (ref 1.7–7.7)
Neutrophils Relative %: 62 %
Platelets: 270 10*3/uL (ref 150–400)
RBC: 3.79 MIL/uL — ABNORMAL LOW (ref 3.87–5.11)
RDW: 14 % (ref 11.5–15.5)
WBC: 6.8 10*3/uL (ref 4.0–10.5)
nRBC: 0 % (ref 0.0–0.2)

## 2024-05-13 LAB — CBG MONITORING, ED: Glucose-Capillary: 119 mg/dL — ABNORMAL HIGH (ref 70–99)

## 2024-05-13 LAB — MAGNESIUM: Magnesium: 1.9 mg/dL (ref 1.7–2.4)

## 2024-05-13 LAB — CARBAMAZEPINE LEVEL, TOTAL: Carbamazepine Lvl: 4.8 ug/mL (ref 4.0–12.0)

## 2024-05-13 MED ORDER — CARBAMAZEPINE 200 MG PO TABS
200.0000 mg | ORAL_TABLET | Freq: Once | ORAL | Status: AC
  Administered 2024-05-13: 200 mg via ORAL
  Filled 2024-05-13: qty 1

## 2024-05-13 NOTE — ED Provider Notes (Signed)
 [place] EMERGENCY DEPARTMENT AT [facility] Provider Note   CSN: [id number] Arrival date & time: 05/13/24  1801     Patient presents with: Seizures   [name] is a 68 y.o. female.   HPI 68 year old female presents with a seizure.  She has a seizure disorder and takes Tegretol .  She has also had a prior stroke.  She has chronic right-sided weakness.  Patient states that she has missed doses of her Tegretol  now and then because of her blindness.  She states that she has an aide that helps with her medicines but cannot always give her her medicines.  Otherwise, patient denies acute headache, chest pain, shortness of breath, or new weakness.  Right now she feels fine.  She most recently took her Tegretol  this morning but thinks she missed a dose yesterday.  Prior to Admission medications   Medication Sig Start Date End Date Taking? Authorizing Provider  albuterol  (VENTOLIN  HFA) 108 (90 Base) MCG/ACT inhaler Inhale 2 puffs into the lungs every 6 (six) hours as needed for wheezing. For wheezing 12/08/23   Alba Sharper, MD  amLODipine  (NORVASC ) 10 MG tablet Take 1 tablet (10 mg total) by mouth daily. 06/03/23 09/01/23  Willette Adriana DELENA, MD  aspirin  EC 81 MG tablet Take 1 tablet (81 mg total) by mouth daily. 12/08/23   Alba Sharper, MD  atorvastatin  (LIPITOR) 20 MG tablet Take 20 mg by mouth daily. 01/14/24   [provider]  carbamazepine  (TEGRETOL ) 200 MG tablet Take 200 mg by mouth 2 (two) times daily. 08/11/22   [provider]  carvedilol  (COREG ) 12.5 MG tablet Take 12.5 mg by mouth 2 (two) times daily. 08/11/22   [provider]  cephALEXin  (KEFLEX ) 500 MG capsule Take 1 capsule (500 mg total) by mouth 4 (four) times daily. 04/11/24   Suzette Pac, MD  clopidogrel  (PLAVIX ) 75 MG tablet Take 75 mg by mouth daily.     [provider]  lisinopril -hydrochlorothiazide  (ZESTORETIC ) 20-12.5 MG tablet Take 1 tablet by mouth 2 (two) times  daily. 01/14/24   [provider]  nitroGLYCERIN  (NITROSTAT ) 0.4 MG SL tablet Place 1 tablet (0.4 mg total) under the tongue every 5 (five) minutes as needed for chest pain. 12/08/23   Alba Sharper, MD  ofloxacin  (OCUFLOX ) 0.3 % ophthalmic solution Place 1 drop into the left eye 3 (three) times daily. 04/02/23   [provider]  potassium chloride  SA (K-DUR,KLOR-CON ) 20 MEQ tablet Take 1 tablet (20 mEq total) by mouth daily. Patient not taking: Reported on 04/19/2024 10/24/18   Ricky Fines, MD  spironolactone  (ALDACTONE ) 25 MG tablet Take 25 mg by mouth daily. 10/28/22   [provider]    Allergies: Bee venom and Other    Review of Systems  Constitutional:  Negative for fever.  Respiratory:  Negative for shortness of breath.   Cardiovascular:  Negative for chest pain.  Gastrointestinal:  Negative for vomiting.  Neurological:  Positive for seizures. Negative for headaches.    Updated Vital Signs BP 137/89 (BP Location: Right Arm)   Pulse 64   Temp 98 F (36.7 C) (Oral)   Resp 18   SpO2 98%   Physical Exam Vitals and nursing note reviewed.  Constitutional:      Appearance: She is well-developed.  HENT:     Head: Normocephalic and atraumatic.   Cardiovascular:     Rate and Rhythm: Normal rate and regular rhythm.     Heart sounds: Normal heart sounds.  Pulmonary:     Effort: Pulmonary effort is normal.     Breath sounds: Normal breath sounds.  Abdominal:     General: There is no distension.     Palpations: Abdomen is soft.     Tenderness: There is no abdominal tenderness.   Musculoskeletal:     Cervical back: No rigidity.   Skin:    General: Skin is warm and dry.   Neurological:     Mental Status: She is alert.     Comments: Chronic right upper greater than lower extremity weakness.  Left upper extremity and left lower extremity have intact strength.    (all labs ordered are listed, but only abnormal results are displayed) Labs Reviewed   COMPREHENSIVE METABOLIC PANEL WITH GFR - Abnormal; Notable for the following components:      Result Value   Glucose, Bld 120 (*)    AST 14 (*)    All other components within normal limits  CBC WITH DIFFERENTIAL/PLATELET - Abnormal; Notable for the following components:   RBC 3.79 (*)    Hemoglobin 10.8 (*)    HCT 33.0 (*)    All other components within normal limits  CBG MONITORING, ED - Abnormal; Notable for the following components:   Glucose-Capillary 119 (*)    All other components within normal limits  CARBAMAZEPINE  LEVEL, TOTAL  MAGNESIUM    EKG: EKG Interpretation Date/Time:  Friday May 13 2024 18:16:57 EDT Ventricular Rate:  63 PR Interval:  210 QRS Duration:  98 QT Interval:  436 QTC Calculation: 447 R Axis:   67  Text Interpretation: Sinus rhythm Probable left ventricular hypertrophy no acute ST/T changes Confirmed by Freddi Hamilton [phone]) on 05/13/2024 6:27:31 PM  Radiology: No results found.   Procedures   Medications Ordered in the ED  carbamazepine  (TEGRETOL ) tablet 200 mg (200 mg Oral Given 05/13/24 1936)                                    Medical Decision Making Amount and/or Complexity of Data Reviewed Labs: ordered.    Details: Normal WBC.  Chronic anemia. ECG/medicine tests: ordered and independent interpretation performed.    Details: No ischemia  Risk Prescription drug management.   Patient reportedly had seizure-like activity at home.  She has a known history of seizures.  Sounds like some questionable compliance with her Tegretol .  She was given her evening dose here and has been observed in the ED for a couple hours with no further seizure-like activity.  She has family here who reports she is acting normally, has otherwise been well, and is sound like this was a breakthrough seizure.  I think she is stable for discharge and she will be given return precautions and instructed to follow-up with PCP.     Final diagnoses:  Seizure  [facility])    ED Discharge Orders     None          Freddi Hamilton, MD 05/13/24 2031

## 2024-05-13 NOTE — ED Triage Notes (Signed)
 Pt arrives by [facility] from home where she lives with family members.  Family witnesses jerking movement which lasted about a minute followed by about a one minute post ictal period.  Pt has hx of seizures and takes medication as prescribed.  Pt arrives alert and oriented.  IV in [facility] by [facility].

## 2024-05-13 NOTE — Discharge Instructions (Signed)
 Be sure to take your seizure medicines as prescribed and on time.  Follow-up with your primary care physician and/your neurologist.  If you develop recurrent seizures, fever, headache, or any other new/concerning symptoms then return to the ER.

## 2024-05-20 ENCOUNTER — Other Ambulatory Visit: Payer: Self-pay | Admitting: Family Medicine

## 2024-05-20 DIAGNOSIS — J449 Chronic obstructive pulmonary disease, unspecified: Secondary | ICD-10-CM

## 2024-09-26 ENCOUNTER — Ambulatory Visit: Admitting: Podiatry

## 2024-09-27 ENCOUNTER — Ambulatory Visit: Admitting: Family Medicine

## 2024-10-30 ENCOUNTER — Encounter (HOSPITAL_COMMUNITY): Payer: Self-pay | Admitting: Emergency Medicine

## 2024-10-30 ENCOUNTER — Other Ambulatory Visit: Payer: Self-pay

## 2024-10-30 ENCOUNTER — Emergency Department (HOSPITAL_COMMUNITY)

## 2024-10-30 ENCOUNTER — Emergency Department (HOSPITAL_COMMUNITY)
Admission: EM | Admit: 2024-10-30 | Discharge: 2024-10-30 | Disposition: A | Discharge status: Home / Self Care | Attending: Emergency Medicine | Admitting: Emergency Medicine

## 2024-10-30 DIAGNOSIS — I10 Essential (primary) hypertension: Secondary | ICD-10-CM | POA: Diagnosis not present

## 2024-10-30 DIAGNOSIS — R0602 Shortness of breath: Secondary | ICD-10-CM | POA: Diagnosis present

## 2024-10-30 DIAGNOSIS — Z7982 Long term (current) use of aspirin: Secondary | ICD-10-CM | POA: Diagnosis not present

## 2024-10-30 DIAGNOSIS — Z853 Personal history of malignant neoplasm of breast: Secondary | ICD-10-CM | POA: Diagnosis not present

## 2024-10-30 DIAGNOSIS — R079 Chest pain, unspecified: Secondary | ICD-10-CM

## 2024-10-30 DIAGNOSIS — R0789 Other chest pain: Secondary | ICD-10-CM | POA: Diagnosis not present

## 2024-10-30 LAB — CBC WITH DIFFERENTIAL/PLATELET
Abs Immature Granulocytes: 0.01 K/uL (ref 0.00–0.07)
Basophils Absolute: 0 K/uL (ref 0.0–0.1)
Basophils Relative: 1 %
Eosinophils Absolute: 0.1 K/uL (ref 0.0–0.5)
Eosinophils Relative: 1 %
HCT: 38.2 % (ref 36.0–46.0)
Hemoglobin: 13 g/dL (ref 12.0–15.0)
Immature Granulocytes: 0 %
Lymphocytes Relative: 30 %
Lymphs Abs: 2.4 K/uL (ref 0.7–4.0)
MCH: 29.3 pg (ref 26.0–34.0)
MCHC: 34 g/dL (ref 30.0–36.0)
MCV: 86 fL (ref 80.0–100.0)
Monocytes Absolute: 0.8 K/uL (ref 0.1–1.0)
Monocytes Relative: 10 %
Neutro Abs: 4.7 K/uL (ref 1.7–7.7)
Neutrophils Relative %: 58 %
Platelets: 305 K/uL (ref 150–400)
RBC: 4.44 MIL/uL (ref 3.87–5.11)
RDW: 13.2 % (ref 11.5–15.5)
WBC: 8.1 K/uL (ref 4.0–10.5)
nRBC: 0 % (ref 0.0–0.2)

## 2024-10-30 LAB — D-DIMER, QUANTITATIVE: D-Dimer, Quant: 0.91 ug{FEU}/mL — ABNORMAL HIGH (ref 0.00–0.50)

## 2024-10-30 LAB — COMPREHENSIVE METABOLIC PANEL WITH GFR
ALT: 7 U/L (ref 0–44)
AST: 26 U/L (ref 15–41)
Albumin: 4.7 g/dL (ref 3.5–5.0)
Alkaline Phosphatase: 80 U/L (ref 38–126)
Anion gap: 16 — ABNORMAL HIGH (ref 5–15)
BUN: 11 mg/dL (ref 8–23)
CO2: 24 mmol/L (ref 22–32)
Calcium: 10.8 mg/dL — ABNORMAL HIGH (ref 8.9–10.3)
Chloride: 102 mmol/L (ref 98–111)
Creatinine, Ser: 1.01 mg/dL — ABNORMAL HIGH (ref 0.44–1.00)
GFR, Estimated: 60 mL/min — ABNORMAL LOW (ref >60)
Glucose, Bld: 151 mg/dL — ABNORMAL HIGH (ref 70–99)
Potassium: 3.5 mmol/L (ref 3.5–5.1)
Sodium: 142 mmol/L (ref 135–145)
Total Bilirubin: 0.5 mg/dL (ref 0.0–1.2)
Total Protein: 8.1 g/dL (ref 6.5–8.1)

## 2024-10-30 LAB — TROPONIN T, HIGH SENSITIVITY
Troponin T High Sensitivity: 22 ng/L — ABNORMAL HIGH (ref 0–19)
Troponin T High Sensitivity: 23 ng/L — ABNORMAL HIGH (ref 0–19)

## 2024-10-30 MED ORDER — IOHEXOL 350 MG/ML SOLN
75.0000 mL | Freq: Once | INTRAVENOUS | Status: AC | PRN
  Administered 2024-10-30: 75 mL via INTRAVENOUS

## 2024-10-30 NOTE — ED Triage Notes (Signed)
 Family called ems for shortness of breath and chest pain during big breaths.

## 2024-10-30 NOTE — ED Notes (Signed)
 Patient transported to CT

## 2024-10-30 NOTE — Discharge Instructions (Signed)
 Endocrinology testing has been reassuring, there is no signs of blood clots pneumonia or heart attack.  Your blood pressure was noted to be elevated and I would increase to make sure that you are taking your medications exactly as prescribed.  You will need to follow-up with your doctor on Monday or Tuesday of this week to come back to the ER for severe or worsening symptoms

## 2024-10-30 NOTE — ED Provider Notes (Signed)
  EMERGENCY DEPARTMENT AT [facility] Provider Note   CSN: [id number] Arrival date & time: 10/30/24  8197     Patient presents with: Shortness of Breath   [name] is a 68 y.o. female.    Shortness of Breath    This patient is a 68 year old female, she has a history of high cholesterol hypertension, prior breast cancer status post surgery and chemo, history of seizures who presents with chest pain on the left side sharp and stabbing worse with deep breathing has been present since yesterday, prior echocardiogram from 2023 showed ejection fraction of 50 to 55%, no diastolic dysfunction noted, stress test from 2020 showed low risk with no reversible ischemia  The patient denies any exertional symptoms, denies swelling of the legs, denies fevers or chills or nausea or vomiting.  She states that she has pain when she takes a deep breath, the pain is been present since yesterday, left-sided, sharp and stabbing.  Arrives by paramedic transport without any abnormal findings including hypoxia or tachycardia  Prior to Admission medications  Medication Sig Start Date End Date Taking? Authorizing Provider  albuterol  (VENTOLIN  HFA) 108 (90 Base) MCG/ACT inhaler Inhale 2 puffs into the lungs every 6 (six) hours as needed for wheezing 05/23/24   Alba Sharper, MD  amLODipine  (NORVASC ) 10 MG tablet Take 1 tablet (10 mg total) by mouth daily. 06/03/23 09/01/23  Willette Adriana DELENA, MD  aspirin  EC 81 MG tablet Take 1 tablet (81 mg total) by mouth daily. 12/08/23   Alba Sharper, MD  atorvastatin  (LIPITOR) 20 MG tablet Take 20 mg by mouth daily. 01/14/24   [provider]  carbamazepine  (TEGRETOL ) 200 MG tablet Take 200 mg by mouth 2 (two) times daily. 08/11/22   [provider]  carvedilol  (COREG ) 12.5 MG tablet Take 12.5 mg by mouth 2 (two) times daily. 08/11/22   [provider]  cephALEXin  (KEFLEX ) 500 MG capsule Take 1 capsule (500 mg total) by mouth  4 (four) times daily. 04/11/24   Suzette Pac, MD  clopidogrel  (PLAVIX ) 75 MG tablet Take 75 mg by mouth daily.     [provider]  lisinopril -hydrochlorothiazide  (ZESTORETIC ) 20-12.5 MG tablet Take 1 tablet by mouth 2 (two) times daily. 01/14/24   [provider]  nitroGLYCERIN  (NITROSTAT ) 0.4 MG SL tablet Place 1 tablet (0.4 mg total) under the tongue every 5 (five) minutes as needed for chest pain. 12/08/23   Alba Sharper, MD  ofloxacin  (OCUFLOX ) 0.3 % ophthalmic solution Place 1 drop into the left eye 3 (three) times daily. 04/02/23   [provider]  potassium chloride  SA (K-DUR,KLOR-CON ) 20 MEQ tablet Take 1 tablet (20 mEq total) by mouth daily. Patient not taking: Reported on 04/19/2024 10/24/18   Ricky Fines, MD  spironolactone  (ALDACTONE ) 25 MG tablet Take 25 mg by mouth daily. 10/28/22   [provider]    Allergies: Bee venom and Other    Review of Systems  Respiratory:  Positive for shortness of breath.   All other systems reviewed and are negative.   Updated Vital Signs BP (!) 171/95   Pulse 93   Temp 98.3 F (36.8 C)   Resp (!) 26   Ht 1.626 m (5' 4)   Wt 58.3 kg   SpO2 100%   BMI 22.06 kg/m   Physical Exam Vitals and nursing note reviewed.  Constitutional:      General: She is not in acute distress.    Appearance: She is well-developed.  HENT:     Head: Normocephalic and atraumatic.     Mouth/Throat:     Pharynx: No oropharyngeal exudate.  Eyes:     General: No scleral icterus.       Right eye: No discharge.        Left eye: No discharge.     Conjunctiva/sclera: Conjunctivae normal.     Pupils: Pupils are equal, round, and reactive to light.  Neck:     Thyroid : No thyromegaly.     Vascular: No JVD.  Cardiovascular:     Rate and Rhythm: Normal rate and regular rhythm.     Heart sounds: Normal heart sounds. No murmur heard.    No friction rub. No gallop.  Pulmonary:     Effort: Pulmonary effort is normal. No  respiratory distress.     Breath sounds: Normal breath sounds. No wheezing or rales.  Abdominal:     General: Bowel sounds are normal. There is no distension.     Palpations: Abdomen is soft. There is no mass.     Tenderness: There is no abdominal tenderness.  Musculoskeletal:        General: No tenderness, deformity or signs of injury. Normal range of motion.     Cervical back: Normal range of motion and neck supple.     Right lower leg: No edema.     Left lower leg: No edema.  Lymphadenopathy:     Cervical: No cervical adenopathy.  Skin:    General: Skin is warm and dry.     Findings: No erythema or rash.  Neurological:     Mental Status: She is alert.     Coordination: Coordination normal.  Psychiatric:        Behavior: Behavior normal.     (all labs ordered are listed, but only abnormal results are displayed) Labs Reviewed  COMPREHENSIVE METABOLIC PANEL WITH GFR - Abnormal; Notable for the following components:      Result Value   Glucose, Bld 151 (*)    Creatinine, Ser 1.01 (*)    Calcium  10.8 (*)    GFR, Estimated 60 (*)    Anion gap 16 (*)    All other components within normal limits  D-DIMER, QUANTITATIVE (NOT AT [facility]) - Abnormal; Notable for the following components:   D-Dimer, Quant 0.91 (*)    All other components within normal limits  TROPONIN T, HIGH SENSITIVITY - Abnormal; Notable for the following components:   Troponin T High Sensitivity 22 (*)    All other components within normal limits  TROPONIN T, HIGH SENSITIVITY - Abnormal; Notable for the following components:   Troponin T High Sensitivity 23 (*)    All other components within normal limits  CBC WITH DIFFERENTIAL/PLATELET    EKG: EKG Interpretation Date/Time:  Sunday October 30 2024 18:18:20 EST Ventricular Rate:  76 PR Interval:  175 QRS Duration:  92 QT Interval:  392 QTC Calculation: 441 R Axis:   74  Text Interpretation: Sinus rhythm Consider left ventricular hypertrophy Confirmed by  Cleotilde Rogue ([id number]) on 10/30/2024 6:27:48 PM  Radiology: CT Angio Chest PE W and/or Wo Contrast Result Date: 10/30/2024 EXAM: CTA of the Chest with contrast for PE 10/30/2024 08:03:40 PM TECHNIQUE: CTA of the chest was performed after the administration of intravenous contrast. Multiplanar reformatted images are provided for review. MIP images are provided for review. Automated exposure control, iterative reconstruction, and/or weight based adjustment of the mA/kV was utilized to reduce the radiation dose to as low as  reasonably achievable. COMPARISON: Portable chest from today, chest radiograph 04/11/2024, chest CT with IV contrast 01/20/2020. CLINICAL HISTORY: Pulmonary embolism (PE) suspected, high prob. Left-sided pleuritic chest pain with shortness of breath. FINDINGS: PULMONARY ARTERIES: Pulmonary arteries are adequately opacified for evaluation. The pulmonary arteries and veins are of normal caliber. No arterial embolism is seen. Main pulmonary artery is normal in caliber. MEDIASTINUM: Mild increased cardiomegaly, with left ventricular wall and septal hypertrophy. Left main and heavy 3-vessel coronary calcifications are again noted. . No pericardial effusion. There are patchy calcifications in the aorta and great vessels without aneurysm, stenosis, or dissection. LYMPH NODES: No mediastinal, hilar or axillary lymphadenopathy. LUNGS AND PLEURA: There are coarse linear atelectatic or scarring changes in both lower lobes, posterior dependent atelectasis. Accessory azygos lobe and fissure medial right apex. There is mild bronchial thickening in the lower lobes. No consolidation, effusion, or suspicious nodules are identified. No pneumothorax. Stable chronic 5 mm left lower lobe nodule again noted on series 10 axial 75, benign considering length of stability. UPPER ABDOMEN: Limited images of the upper abdomen are unremarkable. SOFT TISSUES AND BONES: The thyroid  gland is not well seen due to spray artifact  from dense contrast in the IVC and right axillosubclavian veins. There is osteopenia, kyphosis and degenerative change of the thoracic spine. No acute or significant osseous findings. No acute soft tissue abnormality. IMPRESSION: 1. No evidence of pulmonary embolism. 2. Mild cardiomegaly with left ventricular wall and septal hypertrophy and extensive coronary artery calcifications. 3. Atelectatic or scarring changes in the lower lobes with mild lower lobe bronchial thickening, without consolidation, effusion, pneumothorax, or suspicious pulmonary nodules. Electronically signed by: Francis Quam MD 10/30/2024 08:22 PM EST RP Workstation: [id number]   DG Chest Port 1 View Result Date: 10/30/2024 EXAM: 1 VIEW(S) XRAY OF THE CHEST 10/30/2024 06:40:00 PM COMPARISON: 04/11/2024 CLINICAL HISTORY: chest pain, left - pleuritic FINDINGS: LUNGS AND PLEURA: Slightly low lung volumes. Linear opacity in right lung base, most compatible with subsegmental atelectasis or scarring. No pleural effusion. No pneumothorax. HEART AND MEDIASTINUM: Atherosclerotic calcifications in thoracic aorta. No acute abnormality of the cardiac silhouette. BONES AND SOFT TISSUES: No acute osseous abnormality. IMPRESSION: 1. No acute cardiopulmonary process identified. 2. Mildly low lung volumes with right basilar linear opacity favored to reflect subsegmental atelectasis or scarring. Electronically signed by: Greig Pique MD 10/30/2024 06:45 PM EST RP Workstation: [id number]     Procedures   Medications Ordered in the ED  iohexol  (OMNIPAQUE ) 350 MG/ML injection 75 mL (75 mLs Intravenous Contrast Given 10/30/24 1950)                                    Medical Decision Making Amount and/or Complexity of Data Reviewed Labs: ordered. Radiology: ordered. ECG/medicine tests: ordered.  Risk Prescription drug management.    This patient presents to the ED for concern of chest pain left-sided sharp and stabbing, this involves an  extensive number of treatment options, and is a complaint that carries with it a high risk of complications and morbidity.  The differential diagnosis includes pneumothorax, pneumonia, pulmonary embolism, coronary syndrome   Co morbidities / Chronic conditions that complicate the patient evaluation  Breast cancer, hypertension   Additional history obtained:  Additional history obtained from EMR External records from outside source obtained and reviewed including medical record including prior echocardiograms, see above     I Ordered, and personally interpreted labs.  The pertinent results  include:, Metabolic panel with normal renal function and electrolytes and a CBC without leukocytosis, normal troponin, flatLab Tests: D-dimer elevated at 0.9, troponin was 22 and a recheck showed that it was flat   Imaging Studies ordered:  I ordered imaging studies including CT angiogram I independently visualized and interpreted imaging which showed no signs of pneumothorax pneumonia or pulmonary embolism I agree with the radiologist interpretation   Cardiac Monitoring: / EKG:  The patient was maintained on a cardiac monitor.  I personally viewed and interpreted the cardiac monitored which showed an underlying rhythm of: Normal sinus rhythm   Problem List / ED Course / Critical interventions / Medication management  Ultimately this patient was in no distress, she did very well and over the course of a couple of hours of observation while we obtained studies to make sure there was not a pathological cause of her pain and the symptoms went away.  She did remain moderately hypertensive but of note the patient does take antihypertensive medications and has them at home. I have reviewed the patients home medicines and have made adjustments as needed     Social Determinants of Health:  Elderly   Test / Admission - Considered:  Considered admission -however results were very reassuring and  no signs of acute coronary syndrome pulmonary embolism or other pathologic cause of her symptoms  I have discussed with the patient at the bedside the results, and the meaning of these results.  They have had opportunity to ask questions,  expressed their understanding to the need for follow-up with primary care physician     Final diagnoses:  Left-sided chest pain  Essential hypertension    ED Discharge Orders     None          Cleotilde Rogue, MD 10/30/24 2110

## 2024-12-05 ENCOUNTER — Encounter
